# Patient Record
Sex: Female | Born: 1955 | Race: Black or African American | Hispanic: No | Marital: Married | Smoking: Current every day smoker
Health system: Southern US, Community
[De-identification: ages and names within clinical notes are randomized; demographics above are authoritative.]

## PROBLEM LIST (undated history)

## (undated) DIAGNOSIS — Z86010 Personal history of colonic polyps: Secondary | ICD-10-CM

## (undated) DIAGNOSIS — E785 Hyperlipidemia, unspecified: Secondary | ICD-10-CM

## (undated) DIAGNOSIS — Z720 Tobacco use: Secondary | ICD-10-CM

## (undated) DIAGNOSIS — M1990 Unspecified osteoarthritis, unspecified site: Secondary | ICD-10-CM

## (undated) DIAGNOSIS — I739 Peripheral vascular disease, unspecified: Secondary | ICD-10-CM

## (undated) DIAGNOSIS — E119 Type 2 diabetes mellitus without complications: Secondary | ICD-10-CM

## (undated) DIAGNOSIS — I10 Essential (primary) hypertension: Secondary | ICD-10-CM

## (undated) DIAGNOSIS — T7840XA Allergy, unspecified, initial encounter: Secondary | ICD-10-CM

## (undated) DIAGNOSIS — K219 Gastro-esophageal reflux disease without esophagitis: Secondary | ICD-10-CM

## (undated) DIAGNOSIS — F419 Anxiety disorder, unspecified: Secondary | ICD-10-CM

## (undated) DIAGNOSIS — I70219 Atherosclerosis of native arteries of extremities with intermittent claudication, unspecified extremity: Secondary | ICD-10-CM

## (undated) DIAGNOSIS — Z794 Long term (current) use of insulin: Secondary | ICD-10-CM

## (undated) HISTORY — DX: Long term (current) use of insulin: Z79.4

## (undated) HISTORY — DX: Personal history of colonic polyps: Z86.010

## (undated) HISTORY — DX: Atherosclerosis of native arteries of extremities with intermittent claudication, unspecified extremity: I70.219

## (undated) HISTORY — DX: Unspecified osteoarthritis, unspecified site: M19.90

## (undated) HISTORY — DX: Peripheral vascular disease, unspecified: I73.9

## (undated) HISTORY — DX: Allergy, unspecified, initial encounter: T78.40XA

## (undated) HISTORY — DX: Gastro-esophageal reflux disease without esophagitis: K21.9

## (undated) HISTORY — DX: Hyperlipidemia, unspecified: E78.5

## (undated) HISTORY — DX: Anxiety disorder, unspecified: F41.9

## (undated) HISTORY — PX: VAGINAL HYSTERECTOMY: SUR661

## (undated) HISTORY — DX: Tobacco use: Z72.0

## (undated) HISTORY — PX: COLONOSCOPY: SHX174

## (undated) HISTORY — DX: Essential (primary) hypertension: I10

## (undated) HISTORY — DX: Type 2 diabetes mellitus without complications: E11.9

---

## 1993-07-20 DIAGNOSIS — E1165 Type 2 diabetes mellitus with hyperglycemia: Secondary | ICD-10-CM

## 1993-07-20 DIAGNOSIS — Z794 Long term (current) use of insulin: Secondary | ICD-10-CM

## 1993-07-20 DIAGNOSIS — E119 Type 2 diabetes mellitus without complications: Secondary | ICD-10-CM | POA: Insufficient documentation

## 1998-06-06 ENCOUNTER — Encounter: Admission: RE | Admit: 1998-06-06 | Discharge: 1998-06-06 | Payer: Self-pay | Admitting: Hematology and Oncology

## 1998-08-28 ENCOUNTER — Encounter: Admission: RE | Admit: 1998-08-28 | Discharge: 1998-08-28 | Payer: Self-pay | Admitting: Hematology and Oncology

## 1998-11-26 ENCOUNTER — Encounter: Admission: RE | Admit: 1998-11-26 | Discharge: 1998-11-26 | Payer: Self-pay | Admitting: Hematology and Oncology

## 1999-02-26 ENCOUNTER — Encounter: Admission: RE | Admit: 1999-02-26 | Discharge: 1999-02-26 | Payer: Self-pay | Admitting: Internal Medicine

## 1999-07-11 ENCOUNTER — Encounter: Admission: RE | Admit: 1999-07-11 | Discharge: 1999-07-11 | Payer: Self-pay | Admitting: Internal Medicine

## 1999-07-31 ENCOUNTER — Encounter: Admission: RE | Admit: 1999-07-31 | Discharge: 1999-07-31 | Payer: Self-pay | Admitting: Hematology and Oncology

## 1999-10-16 ENCOUNTER — Encounter: Admission: RE | Admit: 1999-10-16 | Discharge: 1999-10-16 | Payer: Self-pay | Admitting: Internal Medicine

## 2000-03-13 ENCOUNTER — Emergency Department (HOSPITAL_COMMUNITY): Admission: EM | Admit: 2000-03-13 | Discharge: 2000-03-13 | Payer: Self-pay | Admitting: Emergency Medicine

## 2000-04-15 ENCOUNTER — Encounter: Admission: RE | Admit: 2000-04-15 | Discharge: 2000-04-15 | Payer: Self-pay | Admitting: Hematology and Oncology

## 2000-07-25 ENCOUNTER — Encounter: Payer: Self-pay | Admitting: Emergency Medicine

## 2000-07-25 ENCOUNTER — Emergency Department (HOSPITAL_COMMUNITY): Admission: EM | Admit: 2000-07-25 | Discharge: 2000-07-25 | Payer: Self-pay | Admitting: Emergency Medicine

## 2000-08-31 ENCOUNTER — Encounter: Admission: RE | Admit: 2000-08-31 | Discharge: 2000-08-31 | Payer: Self-pay | Admitting: Hematology and Oncology

## 2000-09-14 ENCOUNTER — Encounter: Admission: RE | Admit: 2000-09-14 | Discharge: 2000-09-14 | Payer: Self-pay | Admitting: Hematology and Oncology

## 2001-02-10 ENCOUNTER — Encounter: Admission: RE | Admit: 2001-02-10 | Discharge: 2001-02-10 | Payer: Self-pay | Admitting: Hematology and Oncology

## 2001-03-04 ENCOUNTER — Encounter: Admission: RE | Admit: 2001-03-04 | Discharge: 2001-03-04 | Payer: Self-pay | Admitting: Internal Medicine

## 2001-03-09 ENCOUNTER — Encounter: Payer: Self-pay | Admitting: *Deleted

## 2001-03-09 ENCOUNTER — Ambulatory Visit (HOSPITAL_COMMUNITY): Admission: RE | Admit: 2001-03-09 | Discharge: 2001-03-09 | Payer: Self-pay | Admitting: *Deleted

## 2001-07-29 ENCOUNTER — Encounter: Admission: RE | Admit: 2001-07-29 | Discharge: 2001-07-29 | Payer: Self-pay | Admitting: Internal Medicine

## 2001-08-10 ENCOUNTER — Ambulatory Visit (HOSPITAL_COMMUNITY): Admission: RE | Admit: 2001-08-10 | Discharge: 2001-08-10 | Payer: Self-pay | Admitting: Internal Medicine

## 2001-08-23 ENCOUNTER — Encounter: Admission: RE | Admit: 2001-08-23 | Discharge: 2001-08-23 | Payer: Self-pay | Admitting: Internal Medicine

## 2001-08-25 ENCOUNTER — Encounter: Admission: RE | Admit: 2001-08-25 | Discharge: 2001-08-25 | Payer: Self-pay | Admitting: Internal Medicine

## 2001-09-08 ENCOUNTER — Encounter: Admission: RE | Admit: 2001-09-08 | Discharge: 2001-09-08 | Payer: Self-pay | Admitting: Internal Medicine

## 2002-12-24 ENCOUNTER — Encounter: Payer: Self-pay | Admitting: Emergency Medicine

## 2002-12-24 ENCOUNTER — Emergency Department (HOSPITAL_COMMUNITY): Admission: EM | Admit: 2002-12-24 | Discharge: 2002-12-24 | Payer: Self-pay | Admitting: Emergency Medicine

## 2004-08-27 ENCOUNTER — Ambulatory Visit: Payer: Self-pay | Admitting: Internal Medicine

## 2004-08-29 ENCOUNTER — Ambulatory Visit: Payer: Self-pay | Admitting: *Deleted

## 2005-02-16 ENCOUNTER — Ambulatory Visit: Payer: Self-pay | Admitting: Internal Medicine

## 2005-02-24 ENCOUNTER — Ambulatory Visit (HOSPITAL_COMMUNITY): Admission: RE | Admit: 2005-02-24 | Discharge: 2005-02-24 | Payer: Self-pay | Admitting: Family Medicine

## 2005-03-17 ENCOUNTER — Ambulatory Visit: Payer: Self-pay | Admitting: Internal Medicine

## 2005-03-25 ENCOUNTER — Ambulatory Visit: Payer: Self-pay | Admitting: Internal Medicine

## 2005-06-26 ENCOUNTER — Ambulatory Visit: Payer: Self-pay | Admitting: Internal Medicine

## 2005-09-21 ENCOUNTER — Ambulatory Visit: Payer: Self-pay | Admitting: Family Medicine

## 2005-10-16 ENCOUNTER — Ambulatory Visit: Payer: Self-pay | Admitting: Internal Medicine

## 2005-11-12 ENCOUNTER — Ambulatory Visit: Payer: Self-pay | Admitting: Internal Medicine

## 2006-01-08 ENCOUNTER — Ambulatory Visit: Payer: Self-pay | Admitting: Internal Medicine

## 2006-01-11 ENCOUNTER — Ambulatory Visit (HOSPITAL_COMMUNITY): Admission: RE | Admit: 2006-01-11 | Discharge: 2006-01-11 | Payer: Self-pay | Admitting: Internal Medicine

## 2006-10-22 ENCOUNTER — Ambulatory Visit: Payer: Self-pay | Admitting: Internal Medicine

## 2006-11-18 ENCOUNTER — Emergency Department (HOSPITAL_COMMUNITY): Admission: EM | Admit: 2006-11-18 | Discharge: 2006-11-18 | Payer: Self-pay | Admitting: Emergency Medicine

## 2007-01-20 ENCOUNTER — Ambulatory Visit: Payer: Self-pay | Admitting: Internal Medicine

## 2007-01-21 ENCOUNTER — Encounter (INDEPENDENT_AMBULATORY_CARE_PROVIDER_SITE_OTHER): Payer: Self-pay | Admitting: Internal Medicine

## 2007-01-21 LAB — CONVERTED CEMR LAB: Microalbumin U total vol: 1.41 mg/L

## 2007-02-15 ENCOUNTER — Ambulatory Visit: Payer: Self-pay | Admitting: Internal Medicine

## 2007-02-15 LAB — CONVERTED CEMR LAB: Hgb A1c MFr Bld: 9 %

## 2007-02-23 ENCOUNTER — Ambulatory Visit (HOSPITAL_COMMUNITY): Admission: RE | Admit: 2007-02-23 | Discharge: 2007-02-23 | Payer: Self-pay | Admitting: Internal Medicine

## 2007-02-23 ENCOUNTER — Ambulatory Visit: Payer: Self-pay | Admitting: Vascular Surgery

## 2007-03-01 ENCOUNTER — Ambulatory Visit: Payer: Self-pay | Admitting: Internal Medicine

## 2007-03-25 ENCOUNTER — Ambulatory Visit: Payer: Self-pay | Admitting: Vascular Surgery

## 2007-07-14 ENCOUNTER — Ambulatory Visit: Payer: Self-pay | Admitting: Internal Medicine

## 2007-07-21 ENCOUNTER — Encounter (INDEPENDENT_AMBULATORY_CARE_PROVIDER_SITE_OTHER): Payer: Self-pay | Admitting: Internal Medicine

## 2007-07-21 DIAGNOSIS — IMO0001 Reserved for inherently not codable concepts without codable children: Secondary | ICD-10-CM | POA: Insufficient documentation

## 2007-07-21 DIAGNOSIS — E119 Type 2 diabetes mellitus without complications: Secondary | ICD-10-CM

## 2007-07-21 DIAGNOSIS — Z794 Long term (current) use of insulin: Secondary | ICD-10-CM

## 2007-07-21 DIAGNOSIS — F17200 Nicotine dependence, unspecified, uncomplicated: Secondary | ICD-10-CM | POA: Insufficient documentation

## 2007-07-21 DIAGNOSIS — I10 Essential (primary) hypertension: Secondary | ICD-10-CM

## 2007-07-21 HISTORY — DX: Reserved for inherently not codable concepts without codable children: IMO0001

## 2007-07-26 ENCOUNTER — Emergency Department (HOSPITAL_COMMUNITY): Admission: EM | Admit: 2007-07-26 | Discharge: 2007-07-26 | Payer: Self-pay | Admitting: Emergency Medicine

## 2007-08-10 ENCOUNTER — Ambulatory Visit: Payer: Self-pay | Admitting: Vascular Surgery

## 2007-08-31 ENCOUNTER — Encounter (INDEPENDENT_AMBULATORY_CARE_PROVIDER_SITE_OTHER): Payer: Self-pay | Admitting: *Deleted

## 2007-12-01 ENCOUNTER — Ambulatory Visit: Payer: Self-pay | Admitting: Internal Medicine

## 2008-04-25 ENCOUNTER — Ambulatory Visit: Payer: Self-pay | Admitting: Internal Medicine

## 2008-07-26 ENCOUNTER — Ambulatory Visit: Payer: Self-pay | Admitting: Internal Medicine

## 2008-07-26 LAB — CONVERTED CEMR LAB
ALT: <8 units/L (ref 0–35)
BUN: 14 mg/dL (ref 6–23)
CO2: 24 meq/L (ref 19–32)
Calcium: 9.9 mg/dL (ref 8.4–10.5)
Chloride: 106 meq/L (ref 96–112)
Cholesterol: 213 mg/dL — ABNORMAL HIGH (ref 0–200)
Creatinine, Ser: 0.64 mg/dL (ref 0.40–1.20)
HDL: 41 mg/dL (ref >39)
Total Bilirubin: 0.4 mg/dL (ref 0.3–1.2)
Total CHOL/HDL Ratio: 5.2
Triglycerides: 209 mg/dL — ABNORMAL HIGH (ref <150)
VLDL: 42 mg/dL — ABNORMAL HIGH (ref 0–40)

## 2008-08-03 ENCOUNTER — Ambulatory Visit: Payer: Self-pay | Admitting: Internal Medicine

## 2008-12-25 ENCOUNTER — Ambulatory Visit: Payer: Self-pay | Admitting: Internal Medicine

## 2008-12-25 LAB — CONVERTED CEMR LAB
BUN: 17 mg/dL (ref 6–23)
CO2: 28 meq/L (ref 19–32)
Calcium: 9.8 mg/dL (ref 8.4–10.5)
Cholesterol: 161 mg/dL (ref 0–200)
Creatinine, Ser: 0.84 mg/dL (ref 0.40–1.20)
Glucose, Bld: 339 mg/dL — ABNORMAL HIGH (ref 70–99)
TSH: 0.569 microintl units/mL (ref 0.350–4.50)
Total CHOL/HDL Ratio: 4.5

## 2009-06-20 ENCOUNTER — Ambulatory Visit: Payer: Self-pay | Admitting: Internal Medicine

## 2009-10-27 ENCOUNTER — Emergency Department (HOSPITAL_COMMUNITY): Admission: EM | Admit: 2009-10-27 | Discharge: 2009-10-27 | Payer: Self-pay | Admitting: Emergency Medicine

## 2009-12-16 ENCOUNTER — Emergency Department (HOSPITAL_BASED_OUTPATIENT_CLINIC_OR_DEPARTMENT_OTHER): Admission: EM | Admit: 2009-12-16 | Discharge: 2009-12-16 | Payer: Self-pay | Admitting: Emergency Medicine

## 2009-12-16 ENCOUNTER — Ambulatory Visit: Payer: Self-pay | Admitting: Diagnostic Radiology

## 2010-02-04 ENCOUNTER — Ambulatory Visit: Payer: Self-pay | Admitting: Internal Medicine

## 2010-02-04 LAB — CONVERTED CEMR LAB
CO2: 26 meq/L (ref 19–32)
Calcium: 9.5 mg/dL (ref 8.4–10.5)
Glucose, Bld: 118 mg/dL — ABNORMAL HIGH (ref 70–99)
Potassium: 3.9 meq/L (ref 3.5–5.3)
Sodium: 142 meq/L (ref 135–145)

## 2010-03-21 ENCOUNTER — Emergency Department (HOSPITAL_COMMUNITY): Admission: EM | Admit: 2010-03-21 | Discharge: 2010-03-21 | Payer: Self-pay | Admitting: Emergency Medicine

## 2010-04-08 ENCOUNTER — Ambulatory Visit: Payer: Self-pay | Admitting: Internal Medicine

## 2010-06-04 ENCOUNTER — Ambulatory Visit: Payer: Self-pay | Admitting: Vascular Surgery

## 2010-06-04 ENCOUNTER — Emergency Department (HOSPITAL_COMMUNITY): Admission: EM | Admit: 2010-06-04 | Discharge: 2010-06-04 | Payer: Self-pay | Admitting: Emergency Medicine

## 2010-06-13 ENCOUNTER — Ambulatory Visit: Payer: Self-pay | Admitting: Vascular Surgery

## 2010-06-13 ENCOUNTER — Ambulatory Visit (HOSPITAL_COMMUNITY): Admission: RE | Admit: 2010-06-13 | Discharge: 2010-06-13 | Payer: Self-pay | Admitting: Vascular Surgery

## 2010-06-13 HISTORY — PX: VEIN BYPASS SURGERY: SHX833

## 2010-07-01 ENCOUNTER — Inpatient Hospital Stay (HOSPITAL_COMMUNITY): Admission: RE | Admit: 2010-07-01 | Discharge: 2010-07-03 | Payer: Self-pay | Admitting: Vascular Surgery

## 2010-07-02 ENCOUNTER — Encounter: Payer: Self-pay | Admitting: Vascular Surgery

## 2010-07-02 ENCOUNTER — Ambulatory Visit: Payer: Self-pay | Admitting: Vascular Surgery

## 2010-07-16 ENCOUNTER — Ambulatory Visit: Payer: Self-pay | Admitting: Vascular Surgery

## 2010-07-16 ENCOUNTER — Encounter: Admission: RE | Admit: 2010-07-16 | Discharge: 2010-07-16 | Payer: Self-pay | Admitting: Vascular Surgery

## 2010-10-20 ENCOUNTER — Ambulatory Visit (HOSPITAL_COMMUNITY): Admission: RE | Admit: 2010-10-20 | Discharge: 2010-10-20 | Payer: Self-pay | Admitting: Family Medicine

## 2010-10-23 ENCOUNTER — Ambulatory Visit: Payer: Self-pay | Admitting: Vascular Surgery

## 2010-12-31 ENCOUNTER — Ambulatory Visit: Payer: Self-pay | Admitting: Vascular Surgery

## 2011-01-30 ENCOUNTER — Encounter (INDEPENDENT_AMBULATORY_CARE_PROVIDER_SITE_OTHER): Payer: Self-pay | Admitting: *Deleted

## 2011-01-30 LAB — CONVERTED CEMR LAB
ALT: 8 units/L (ref 0–35)
AST: 9 units/L (ref 0–37)
Albumin: 4.2 g/dL (ref 3.5–5.2)
BUN: 20 mg/dL (ref 6–23)
CO2: 29 meq/L (ref 19–32)
Calcium: 9.7 mg/dL (ref 8.4–10.5)
Chloride: 97 meq/L (ref 96–112)
Creatinine, Ser: 0.72 mg/dL (ref 0.40–1.20)
Microalb, Ur: 0.58 mg/dL (ref 0.00–1.89)
Potassium: 3.8 meq/L (ref 3.5–5.3)

## 2011-02-28 LAB — GLUCOSE, CAPILLARY
Glucose-Capillary: 107 mg/dL — ABNORMAL HIGH (ref 70–99)
Glucose-Capillary: 122 mg/dL — ABNORMAL HIGH (ref 70–99)
Glucose-Capillary: 159 mg/dL — ABNORMAL HIGH (ref 70–99)
Glucose-Capillary: 168 mg/dL — ABNORMAL HIGH (ref 70–99)
Glucose-Capillary: 266 mg/dL — ABNORMAL HIGH (ref 70–99)
Glucose-Capillary: 317 mg/dL — ABNORMAL HIGH (ref 70–99)

## 2011-02-28 LAB — HEMOGLOBIN A1C
Hgb A1c MFr Bld: 11.3 % — ABNORMAL HIGH (ref <5.7)
Mean Plasma Glucose: 278 mg/dL — ABNORMAL HIGH (ref <117)

## 2011-02-28 LAB — CBC
Platelets: 210 10*3/uL (ref 150–400)
RBC: 4.03 MIL/uL (ref 3.87–5.11)
RDW: 14.2 % (ref 11.5–15.5)
WBC: 6.7 10*3/uL (ref 4.0–10.5)

## 2011-02-28 LAB — BASIC METABOLIC PANEL
BUN: 11 mg/dL (ref 6–23)
Creatinine, Ser: 0.76 mg/dL (ref 0.4–1.2)
GFR calc Af Amer: >60 mL/min (ref >60)
GFR calc non Af Amer: >60 mL/min (ref >60)

## 2011-03-01 LAB — COMPREHENSIVE METABOLIC PANEL
ALT: 17 U/L (ref 0–35)
Alkaline Phosphatase: 90 U/L (ref 39–117)
CO2: 31 mEq/L (ref 19–32)
Chloride: 98 mEq/L (ref 96–112)
GFR calc non Af Amer: >60 mL/min (ref >60)
Glucose, Bld: 347 mg/dL — ABNORMAL HIGH (ref 70–99)
Potassium: 4 mEq/L (ref 3.5–5.1)
Sodium: 137 mEq/L (ref 135–145)

## 2011-03-01 LAB — CBC
HCT: 37.4 % (ref 36.0–46.0)
Hemoglobin: 12.4 g/dL (ref 12.0–15.0)
Hemoglobin: 13.2 g/dL (ref 12.0–15.0)
MCHC: 32.1 g/dL (ref 30.0–36.0)
Platelets: 288 10*3/uL (ref 150–400)
RDW: 14.3 % (ref 11.5–15.5)
WBC: 4.7 10*3/uL (ref 4.0–10.5)

## 2011-03-01 LAB — DIFFERENTIAL
Basophils Absolute: 0 10*3/uL (ref 0.0–0.1)
Basophils Relative: 1 % (ref 0–1)
Neutro Abs: 2.9 10*3/uL (ref 1.7–7.7)
Neutrophils Relative %: 51 % (ref 43–77)

## 2011-03-01 LAB — URINALYSIS, ROUTINE W REFLEX MICROSCOPIC
Bilirubin Urine: NEGATIVE
Hgb urine dipstick: NEGATIVE
Hgb urine dipstick: NEGATIVE
Nitrite: NEGATIVE
Nitrite: NEGATIVE
Specific Gravity, Urine: 1.042 — ABNORMAL HIGH (ref 1.005–1.030)
Specific Gravity, Urine: 1.028 (ref 1.005–1.030)
Urobilinogen, UA: 1 mg/dL (ref 0.0–1.0)
pH: 6.5 (ref 5.0–8.0)

## 2011-03-01 LAB — POCT I-STAT, CHEM 8
Chloride: 97 mEq/L (ref 96–112)
Chloride: 100 mEq/L (ref 96–112)
Creatinine, Ser: 0.9 mg/dL (ref 0.4–1.2)
Glucose, Bld: 360 mg/dL — ABNORMAL HIGH (ref 70–99)
HCT: 45 % (ref 36.0–46.0)
Hemoglobin: 14.3 g/dL (ref 12.0–15.0)
Hemoglobin: 15.3 g/dL — ABNORMAL HIGH (ref 12.0–15.0)
Potassium: 3.7 mEq/L (ref 3.5–5.1)
Potassium: 3.6 mEq/L (ref 3.5–5.1)
Sodium: 139 mEq/L (ref 135–145)

## 2011-03-01 LAB — PROTIME-INR: Prothrombin Time: 12.4 seconds (ref 11.6–15.2)

## 2011-03-01 LAB — GLUCOSE, CAPILLARY
Glucose-Capillary: 305 mg/dL — ABNORMAL HIGH (ref 70–99)
Glucose-Capillary: 235 mg/dL — ABNORMAL HIGH (ref 70–99)
Glucose-Capillary: 278 mg/dL — ABNORMAL HIGH (ref 70–99)

## 2011-03-01 LAB — URINE MICROSCOPIC-ADD ON

## 2011-03-01 LAB — TYPE AND SCREEN: Antibody Screen: NEGATIVE

## 2011-03-01 LAB — SURGICAL PCR SCREEN: MRSA, PCR: NEGATIVE

## 2011-03-01 LAB — CK: Total CK: 72 U/L (ref 7–177)

## 2011-03-18 ENCOUNTER — Emergency Department (HOSPITAL_COMMUNITY)
Admission: EM | Admit: 2011-03-18 | Discharge: 2011-03-18 | Disposition: A | Discharge status: Home / Self Care | Payer: Self-pay | Attending: Emergency Medicine | Admitting: Emergency Medicine

## 2011-03-18 DIAGNOSIS — Z794 Long term (current) use of insulin: Secondary | ICD-10-CM | POA: Insufficient documentation

## 2011-03-18 DIAGNOSIS — I10 Essential (primary) hypertension: Secondary | ICD-10-CM | POA: Insufficient documentation

## 2011-03-18 DIAGNOSIS — E119 Type 2 diabetes mellitus without complications: Secondary | ICD-10-CM | POA: Insufficient documentation

## 2011-03-18 DIAGNOSIS — T63461A Toxic effect of venom of wasps, accidental (unintentional), initial encounter: Secondary | ICD-10-CM | POA: Insufficient documentation

## 2011-03-18 DIAGNOSIS — M7989 Other specified soft tissue disorders: Secondary | ICD-10-CM | POA: Insufficient documentation

## 2011-03-18 DIAGNOSIS — T6391XA Toxic effect of contact with unspecified venomous animal, accidental (unintentional), initial encounter: Secondary | ICD-10-CM | POA: Insufficient documentation

## 2011-03-18 LAB — GLUCOSE, CAPILLARY

## 2011-04-28 NOTE — Assessment & Plan Note (Signed)
OFFICE VISIT   TAYGAN, CONNELL  DOB:  [birth year]                                       08/10/2007  EAVWU#:[id number]   The patient returns for followup today for claudication and evaluation  of a right carotid bruit. Unfortunately, she continues to smoke. She  states that is smoking three cigarettes per day. Her claudication  symptoms are similar to her previous visit in April. She states that her  symptoms may have improved somewhat though with continued walking and  with the Pletal. She continues to have variable symptoms at short  distances and sometimes long distances. However, she does not really  complain of problems with her legs. She has had no neurologic symptoms  such as TIA, amaurosis or stroke. She has had no problems with healing  wounds on her feet.   PHYSICAL EXAMINATION:  Blood pressure 134/81, heart rate 77 and regular.  She had an audible right carotid bruit. She has no left carotid bruit.  She has 2+ carotid pulses. She has 2+ brachial and radial pulses. She  has 2+ femoral pulses with absent popliteal and pedal pulses  bilaterally. ABIs today were 0.56 on the right and 0.63 on the left,  which are essentially unchanged from March of 2008. She also had a  carotid duplex examination today. This showed a right external carotid  artery stenosis, but no significant internal carotid artery stenosis.   As far as her carotid is concerned, she has no significant internal  carotid artery stenosis. I do not believe this needs further evaluation  and the bruit can probably be explained by external carotid artery  stenosis. She probably needs a repeat duplex evaluation in the next 5-6  years due to her underlying risk factor of smoking. As far as her legs  are concerned, she has fairly minimal symptoms at this point. She still  has some Pletal refill prescriptions at this point and she seems to be  satisfied with her walking distance currently. I  reassured her that she  currently is not at risk  for limb loss, but if she continues to smoke, she may be at some point  in the future. She will followup with me in six months time for repeat  ABIs.   Janetta Hora. Fields, MD  Electronically Signed   CEF/MEDQ  D:  08/10/2007  T:  08/11/2007  Job:  [id number]   cc:   Melvern Banker

## 2011-04-28 NOTE — Assessment & Plan Note (Signed)
OFFICE VISIT   Angie, [name]  DOB:  [birth year]                                       10/23/2010  ZOXWR#:[id number]   The patient returns for followup today.  She underwent a femoral-femoral  bypass in July 2011 for claudication in her right leg.  She returns  today for further follow-up.  She states that she still has some  heaviness in her legs on both sides after dancing for long periods of  time but this is not really that bothersome to her.  She also complains  of some intermittent right heel and foot pain and has an appointment  scheduled with a foot doctor in the near future.  This occurs primarily  when she is bearing weight on the right foot.  She does not describe  rest pain or classic claudication.   CHRONIC MEDICAL PROBLEMS:  Include diabetes.  She also has a history of  smoking but quit in July of this past year.   REVIEW OF SYSTEMS:  On review of systems today, she denies any chest  pain or shortness of breath.   PHYSICAL EXAM:  Vital signs:  Blood pressure 110/75 in the left arm,  heart rate 86 and regular, respirations 16. Lower extremities:  She has  an easily palpable femoral-femoral graft pulse.  Her groin incisions are  well-healed.  She has no palpable popliteal or pedal pulses.   She had bilateral ABIs performed today which were 0.69 on the left and  0.57 on the right.  This is a significant improvement on the right side  postoperatively.  She preoperatively was 0.3 on the right.   Overall, the patient is improved from her femoral-femoral bypass.  She  still has some element of peripheral arterial disease with some mild  claudication-type symptoms in her lower extremities bilaterally.  However, at this point she is not interested in any further procedures  to try to improve her circulation further.  Her symptoms are mild in  nature overall.  She will follow up with [place] in 6 months' time to  reevaluate her symptoms and have a  noninvasive exam to make sure that  she is not declining in her ABIs over time.     Angie Hora. Fields, MD  Electronically Signed   CEF/MEDQ  D:  10/23/2010  T:  10/24/2010  Job:  [id number]   cc:   Dineen Kid. Reche Dixon, M.D.  [facility]

## 2011-04-28 NOTE — Assessment & Plan Note (Signed)
OFFICE VISIT   Angie Cain, Angie Cain  DOB:  08/03/1956                                       07/16/2010  ZOXWR#:[id number]   The patient returns for followup today.  She underwent an uneventful  left to right femoral-femoral bypass on 07/01/2010.  She returns today  for further followup.  She states that the claudication symptoms in her  right leg have essentially resolved at this point.  She intermittently  has had some back pain across her lower back and this seems mainly  musculoskeletal in nature and may be due to the fact that she has been  limping some due to her groin incisions.  She also has had some  intermittent pain in her left foot and again I think this may be  somewhat due to the fact that she is walking more than she has been in  some time and probably has some muscle strain overall.  I counseled her  to take some ibuprofen to help alleviate some of these symptoms.   On physical exam blood pressure is 100/69 in the left arm, heart rate is  92 and regular, respirations 16.  The right groin wound has a  superficial epidermis loss approximately 2 x 1 cm at the superior aspect  of the right groin wound.  This does not penetrate deep and there is no  significant drainage from this.  The left groin incision is healed.  She  has an easily palpable graft pulse in her fem-fem bypass.  She has 2+  femoral pulses bilaterally.  She has absent popliteal and pedal pulses.  She had bilateral ABIs today.  The right side ABI has improved from 0.34  to 0.58 postoperatively and the left ABI was 0.7 today.   Overall the patient appears to be doing well.  Her groin incision should  continue to heal.  She will return sooner if she has any problems or  deterioration of the right groin wound.  Otherwise we will see her back  in three months' time for a graft scan.  We will then see her in  intermittent followup after that.  We will also see her for followup in  3  months to see if the symptoms in her lower extremities have improved  somewhat overall.     Janetta Hora. Fields, MD  Electronically Signed   CEF/MEDQ  D:  07/16/2010  T:  07/17/2010  Job:  [id number]   cc:   Dineen Kid. Reche Dixon, M.D.  [facility]

## 2011-04-28 NOTE — Procedures (Signed)
CAROTID DUPLEX EXAM   INDICATION:  Carotid bruit.   HISTORY:  Diabetes:  Yes  Cardiac:  No  Hypertension:  Yes  Smoking:  3 cigarettes a day  Previous Surgery:  No  CV History:  No  Amaurosis Fugax No, Paresthesias No, Hemiparesis No                                       RIGHT             LEFT  Brachial systolic pressure:         138               140  Brachial Doppler waveforms:         Triphasic         Triphasic  Vertebral direction of flow:        Antegrade         Antegrade  DUPLEX VELOCITIES (cm/sec)  CCA peak systolic                   105               96  ECA peak systolic                   292               94  ICA peak systolic                   69                55  ICA end diastolic                   29                22  PLAQUE MORPHOLOGY:                  Mixed             None  PLAQUE AMOUNT:                      Large             None  PLAQUE LOCATION:                    ECA               None   IMPRESSION:  1. Right ECA stenosis.  2. No ICA stenosis bilaterally.   ___________________________________________  Janetta Hora Darrick Penna, MD   DP/MEDQ  D:  08/10/2007  T:  08/11/2007  Job:  [id number]

## 2011-04-28 NOTE — Procedures (Signed)
LOWER EXTREMITY ARTERIAL EVALUATION-SINGLE LEVEL   INDICATION:  Followup peripheral vascular disease.   HISTORY:  Diabetes:  Yes  Cardiac:  No  Hypertension:  Yes  Smoking:  Yes, 3 cigarettes per day  Previous Surgery:  No   RESTING SYSTOLIC PRESSURES: (ABI)                          RIGHT                LEFT  Brachial:               138                  140  Anterior tibial:        74                   80  Posterior tibial:       78 (0.56)            88 (0.63)  Peroneal:  DOPPLER WAVEFORM ANALYSIS:  Anterior tibial:        Monophasic           Monophasic  Posterior tibial:       Monophasic           Monophasic  Peroneal:   PREVIOUS ABI'S:  Date: 02/23/2007  RIGHT:  0.52  LEFT:  0.67   IMPRESSION:  Stable ankle brachial indices bilaterally.   ___________________________________________  Janetta Hora Darrick Penna, MD   DP/MEDQ  D:  08/10/2007  T:  08/11/2007  Job:  [id number]

## 2011-04-28 NOTE — Assessment & Plan Note (Signed)
OFFICE VISIT   Angie, Cain  DOB:  03/05/1956                                       06/04/2010  EAVWU#:[id number]   The patient is a 55 year old female referred by [facility] for right  lower extremity pain.  I saw her in August 2008 for claudication  symptoms and a right carotid bruit.  She states that her symptoms have  progressively worsened over time and she has basically had continuous  pain in her right foot for the last 2 months.  She sometimes has to  dangle the foot out of bed to relieve the pain.  I congratulated her  today that she has now been 60 days tobacco-free.  She also has a  history of neuropathy for the past several years.  She has not had any  problems with nonhealing ulcerations on the foot.   Chronic medical problems include:  Diabetes, hypertension, elevated  cholesterol.  There are all currently followed by [facility].   PAST SURGICAL HISTORY:  Hysterectomy.   MEDICATIONS:  Glucotrol XL 10 mg once a day,  lisinopril/hydrochlorothiazide 20/25 once a day, Lantus insulin in the  morning, aspirin 81 mg once a day, Neurontin 300 mg twice a day, Vicodin  5/500 three times a day, amitriptyline 50 mg 1 q.h.s., Xyzal 5 mg once  daily, Crestor 10 mg q.h.s.   ALLERGIES:  She has an allergy listed to Flagyl.   SOCIAL HISTORY:  She is unemployed.  She is married.  She has 2  children.  Smoking history is as listed above.  She does not consume  alcohol regularly and denies drug use.   FAMILY HISTORY:  Unremarkable.   REVIEW OF SYSTEMS:  Twelve-point review of systems was performed with  the patient.  Please see intake referral form for details regarding  this.   PHYSICAL EXAM:  Blood pressure 110/73 in the left arm.  Heart rate is  100 and regular.  Temperature is 98.  HEENT is unremarkable.  Neck has  2+ carotid pulses without bruit.  Chest:  Clear to auscultation.  Cardiac exam is regular rate and rhythm without murmur.   Abdomen is  soft, nontender, nondistended, no masses.  Extremities:  She has 2+  radial pulses bilaterally.  In her lower extremities, she has a 2+ left  femoral pulse.  She has absent right femoral pulse.  She has absent  popliteal and pedal pulses bilaterally.  Musculoskeletal exam:  She has  no significant major joint deformities.  Skin:  She has no open ulcers  or rashes.  Neurologic exam shows symmetric upper extremity and lower  extremity motor strength which is 5/5.  She has decreased sensation to  light touch in the feet bilaterally.   She had bilateral ABIs today which were reviewed and interpreted by me.  ABI on the right side was 0.34, on the left was 0.78.   In summary, the patient has severely decreased ABI on the right side and  has symptoms of rest pain.  She now seems to have progressed from  claudication to more severe arterial occlusive disease on the right  side.  I believe the best option would be arteriogram and lower  extremity runoff.  She most likely has a component of multilevel  arterial disease in the right leg.  I have discussed with her, today,  the risks  and possible complications, and procedure details involved in  arteriogram as well as possible angioplasty and stenting.  She  understands and agrees to proceed.  This is scheduled for June 13, 2010.     Janetta Hora. Fields, MD  Electronically Signed   CEF/MEDQ  D:  06/04/2010  T:  06/05/2010  Job:  [id number]   cc:   Dineen Kid. Reche Dixon, M.D.  [facility]

## 2011-06-13 ENCOUNTER — Emergency Department (HOSPITAL_COMMUNITY)
Admission: EM | Admit: 2011-06-13 | Discharge: 2011-06-13 | Disposition: A | Discharge status: Home / Self Care | Payer: Self-pay | Attending: Emergency Medicine | Admitting: Emergency Medicine

## 2011-06-13 ENCOUNTER — Emergency Department (HOSPITAL_COMMUNITY): Payer: Self-pay

## 2011-06-13 DIAGNOSIS — F3289 Other specified depressive episodes: Secondary | ICD-10-CM | POA: Insufficient documentation

## 2011-06-13 DIAGNOSIS — R109 Unspecified abdominal pain: Secondary | ICD-10-CM | POA: Insufficient documentation

## 2011-06-13 DIAGNOSIS — I10 Essential (primary) hypertension: Secondary | ICD-10-CM | POA: Insufficient documentation

## 2011-06-13 DIAGNOSIS — Z794 Long term (current) use of insulin: Secondary | ICD-10-CM | POA: Insufficient documentation

## 2011-06-13 DIAGNOSIS — R42 Dizziness and giddiness: Secondary | ICD-10-CM | POA: Insufficient documentation

## 2011-06-13 DIAGNOSIS — E119 Type 2 diabetes mellitus without complications: Secondary | ICD-10-CM | POA: Insufficient documentation

## 2011-06-13 DIAGNOSIS — F329 Major depressive disorder, single episode, unspecified: Secondary | ICD-10-CM | POA: Insufficient documentation

## 2011-06-13 DIAGNOSIS — R1031 Right lower quadrant pain: Secondary | ICD-10-CM | POA: Insufficient documentation

## 2011-06-13 DIAGNOSIS — R112 Nausea with vomiting, unspecified: Secondary | ICD-10-CM | POA: Insufficient documentation

## 2011-06-13 LAB — RAPID URINE DRUG SCREEN, HOSP PERFORMED
Amphetamines: NOT DETECTED
Barbiturates: NOT DETECTED
Tetrahydrocannabinol: POSITIVE — AB

## 2011-06-13 LAB — COMPREHENSIVE METABOLIC PANEL
AST: 14 U/L (ref 0–37)
Albumin: 4 g/dL (ref 3.5–5.2)
Alkaline Phosphatase: 61 U/L (ref 39–117)
BUN: 11 mg/dL (ref 6–23)
Chloride: 100 mEq/L (ref 96–112)
Potassium: 3.3 mEq/L — ABNORMAL LOW (ref 3.5–5.1)
Total Bilirubin: 0.3 mg/dL (ref 0.3–1.2)
Total Protein: 7.6 g/dL (ref 6.0–8.3)

## 2011-06-13 LAB — DIFFERENTIAL
Basophils Relative: 0 % (ref 0–1)
Lymphocytes Relative: 19 % (ref 12–46)
Lymphs Abs: 1.4 10*3/uL (ref 0.7–4.0)
Monocytes Relative: 5 % (ref 3–12)
Neutro Abs: 5.4 10*3/uL (ref 1.7–7.7)
Neutrophils Relative %: 76 % (ref 43–77)

## 2011-06-13 LAB — CBC
HCT: 39.7 % (ref 36.0–46.0)
Hemoglobin: 12.9 g/dL (ref 12.0–15.0)
MCH: 24.5 pg — ABNORMAL LOW (ref 26.0–34.0)
RBC: 5.27 MIL/uL — ABNORMAL HIGH (ref 3.87–5.11)

## 2011-06-13 LAB — URINALYSIS, ROUTINE W REFLEX MICROSCOPIC
Bilirubin Urine: NEGATIVE
Glucose, UA: 100 mg/dL — AB
Hgb urine dipstick: NEGATIVE
Ketones, ur: 15 mg/dL — AB
Protein, ur: NEGATIVE mg/dL
Urobilinogen, UA: 0.2 mg/dL (ref 0.0–1.0)

## 2011-06-13 LAB — GLUCOSE, CAPILLARY: Glucose-Capillary: 177 mg/dL — ABNORMAL HIGH (ref 70–99)

## 2011-06-13 MED ORDER — IOHEXOL 300 MG/ML  SOLN
125.0000 mL | Freq: Once | INTRAMUSCULAR | Status: AC | PRN
  Administered 2011-06-13: 125 mL via INTRAVENOUS

## 2011-08-14 ENCOUNTER — Other Ambulatory Visit: Payer: Self-pay

## 2011-08-14 ENCOUNTER — Other Ambulatory Visit (HOSPITAL_COMMUNITY): Payer: Self-pay | Admitting: Family Medicine

## 2011-08-14 DIAGNOSIS — Z780 Asymptomatic menopausal state: Secondary | ICD-10-CM

## 2011-08-14 DIAGNOSIS — Z1231 Encounter for screening mammogram for malignant neoplasm of breast: Secondary | ICD-10-CM

## 2011-08-27 ENCOUNTER — Ambulatory Visit (HOSPITAL_COMMUNITY)
Admission: RE | Admit: 2011-08-27 | Discharge: 2011-08-27 | Disposition: A | Discharge status: Home / Self Care | Payer: Self-pay | Source: Ambulatory Visit | Attending: Family Medicine | Admitting: Family Medicine

## 2011-08-27 DIAGNOSIS — Z780 Asymptomatic menopausal state: Secondary | ICD-10-CM

## 2011-08-27 DIAGNOSIS — Z1231 Encounter for screening mammogram for malignant neoplasm of breast: Secondary | ICD-10-CM | POA: Insufficient documentation

## 2011-09-28 LAB — COMPREHENSIVE METABOLIC PANEL
AST: 15
Albumin: 3.2 — ABNORMAL LOW
Alkaline Phosphatase: 91
Chloride: 106
GFR calc Af Amer: >60
Potassium: 3.9
Total Bilirubin: 0.7

## 2011-09-28 LAB — URINE MICROSCOPIC-ADD ON

## 2011-09-28 LAB — CBC
Platelets: 318
WBC: 7

## 2011-09-28 LAB — URINALYSIS, ROUTINE W REFLEX MICROSCOPIC
Glucose, UA: >1000 — AB
Ketones, ur: NEGATIVE
Leukocytes, UA: NEGATIVE
pH: 6.5

## 2011-09-28 LAB — DIFFERENTIAL
Basophils Absolute: 0
Basophils Relative: 0
Eosinophils Relative: 1
Monocytes Absolute: 0.8 — ABNORMAL HIGH

## 2011-09-28 LAB — POCT CARDIAC MARKERS: CKMB, poc: <1 — ABNORMAL LOW

## 2012-06-02 ENCOUNTER — Emergency Department (HOSPITAL_COMMUNITY)
Admission: EM | Admit: 2012-06-02 | Discharge: 2012-06-02 | Disposition: A | Discharge status: Home / Self Care | Payer: Self-pay | Attending: Emergency Medicine | Admitting: Emergency Medicine

## 2012-06-02 ENCOUNTER — Encounter (HOSPITAL_COMMUNITY): Payer: Self-pay | Admitting: Emergency Medicine

## 2012-06-02 DIAGNOSIS — Z794 Long term (current) use of insulin: Secondary | ICD-10-CM | POA: Insufficient documentation

## 2012-06-02 DIAGNOSIS — M2550 Pain in unspecified joint: Secondary | ICD-10-CM | POA: Insufficient documentation

## 2012-06-02 DIAGNOSIS — F17200 Nicotine dependence, unspecified, uncomplicated: Secondary | ICD-10-CM | POA: Insufficient documentation

## 2012-06-02 DIAGNOSIS — E119 Type 2 diabetes mellitus without complications: Secondary | ICD-10-CM | POA: Insufficient documentation

## 2012-06-02 LAB — CBC
MCHC: 32.5 g/dL (ref 30.0–36.0)
MCV: 78 fL (ref 78.0–100.0)
Platelets: 321 10*3/uL (ref 150–400)
RDW: 14 % (ref 11.5–15.5)
WBC: 4.3 10*3/uL (ref 4.0–10.5)

## 2012-06-02 LAB — COMPREHENSIVE METABOLIC PANEL
ALT: 10 U/L (ref 0–35)
Albumin: 3.9 g/dL (ref 3.5–5.2)
Alkaline Phosphatase: 74 U/L (ref 39–117)
BUN: 11 mg/dL (ref 6–23)
Chloride: 98 mEq/L (ref 96–112)
Glucose, Bld: 193 mg/dL — ABNORMAL HIGH (ref 70–99)
Potassium: 3.6 mEq/L (ref 3.5–5.1)
Total Bilirubin: 0.3 mg/dL (ref 0.3–1.2)

## 2012-06-02 MED ORDER — KETOROLAC TROMETHAMINE 10 MG PO TABS: 10.0000 mg | ORAL_TABLET | Freq: Four times a day (QID) | ORAL | Status: AC | PRN

## 2012-06-02 MED ORDER — SODIUM CHLORIDE 0.9 % IV SOLN
Freq: Once | INTRAVENOUS | Status: AC
  Administered 2012-06-02: 20 mL/h via INTRAVENOUS

## 2012-06-02 MED ORDER — KETOROLAC TROMETHAMINE 30 MG/ML IJ SOLN
30.0000 mg | Freq: Once | INTRAMUSCULAR | Status: AC
  Administered 2012-06-02: 30 mg via INTRAVENOUS
  Filled 2012-06-02: qty 1

## 2012-06-02 NOTE — ED Provider Notes (Signed)
History     CSN: [id number]  Arrival date & time 06/02/12  9629   First MD Initiated Contact with Patient 06/02/12 1034      Chief Complaint  Patient presents with  . Leg Pain  . Hip Pain  . Back Pain    (Consider location/radiation/quality/duration/timing/severity/associated sxs/prior treatment) Patient is a 56 y.o. female presenting with leg pain, hip pain, and back pain. The history is provided by the patient.  Leg Pain  The incident occurred more than 1 week ago. There was no injury mechanism.  Hip Pain  Back Pain  Associated symptoms include leg pain.   56 y/o IDDM female INAD c/o bilateral posterior leg hip and low back pain occuring constanatly and steadily worsening for 3 months. Pt has been taking neurontin for her diabetic neuropathy with little relief. Pt has been seen by her PCP for same complaint and told that it was a combination of diabetic neuropathy and arthritis. Pt has tried  Multiple OTC rx and has been given shots of dilaudid with little relief. Denies change in bowel or bladder habits or numbness and paraesthesia. Pain is not exacerbated by movent but by position change, specifically going from sitting to standing.  Pt presently today because she sates the pain is more severe over the last day.     Past Medical History  Diagnosis Date  . Diabetes mellitus     Past Surgical History  Procedure Date  . Vein bypass surgery 06/2010    vein from left to right hip    No family history on file.  History  Substance Use Topics  . Smoking status: Current Some Day Smoker -- 0.5 packs/day  . Smokeless tobacco: Not on file  . Alcohol Use: No    OB History    Grav Para Term Preterm Abortions TAB SAB Ect Mult Living                  Review of Systems  Musculoskeletal: Positive for back pain.  All other systems reviewed and are negative.    Allergies  Metronidazole  Home Medications   Current Outpatient Rx  Name Route Sig Dispense Refill  .  VITAMIN D 1000 UNITS PO TABS Oral Take 1,000 Units by mouth daily.    Marland Kitchen GABAPENTIN 300 MG PO CAPS Oral Take 300 mg by mouth 3 (three) times daily.    . INSULIN ASPART 100 UNIT/ML [place] SOLN Subcutaneous Inject 2-11 Units into the skin See admin instructions. Per sliding scale    . INSULIN GLARGINE 100 UNIT/ML [place] SOLN Subcutaneous Inject 37 Units into the skin at bedtime.    Marland Kitchen LISINOPRIL 20 MG PO TABS Oral Take 20 mg by mouth daily.    Marland Kitchen VITAMIN C 250 MG PO TABS Oral Take 250 mg by mouth daily.      BP 110/68  Pulse 65  Temp 97.4 F (36.3 C) (Oral)  Resp 16  Ht 5\' 5"  (1.651 m)  Wt 160 lb (72.576 kg)  BMI 26.63 kg/m2  SpO2 100%  Physical Exam  Vitals reviewed. Constitutional: She appears well-developed and well-nourished. No distress.       Pt appears comfortable: reclining in bed asleep, easily rousable.   HENT:  Head: Normocephalic.  Eyes: Conjunctivae and EOM are normal. Pupils are equal, round, and reactive to light.  Cardiovascular: Normal rate and intact distal pulses.   Pulmonary/Chest: Effort normal.  Abdominal: Soft. Bowel sounds are normal.  Musculoskeletal: Normal range of motion. She exhibits no  edema and no tenderness.       Strait leg raise negative bilaterally  Neurological: She is alert.       Converses appropriately   Skin: No rash noted.       No evidence of venous insufficiency   Psychiatric: She has a normal mood and affect.    ED Course  Procedures (including critical care time)  Labs Reviewed  COMPREHENSIVE METABOLIC PANEL - Abnormal; Notable for the following:    Glucose, Bld 193 (*)     All other components within normal limits  CBC - Abnormal; Notable for the following:    RBC 5.40 (*)     MCH 25.4 (*)     All other components within normal limits   No results found.   1. Arthralgia       MDM  56 y/o female INAD c/o bilateral leg and back pain x2 months. No claudication. Distal pulses intact. Will refer to neurology for pain management.   Pt verbalized understanding and agrees with care plan. Outpatient follow-up and return precautions given.          Wynetta Emery, PA-C 06/02/12 1417

## 2012-06-02 NOTE — ED Notes (Signed)
Pt states she developed pain behind her knees, radiates up and down legs, settles more in her lower back and hips, no reported injuries, no history other than neuropathy in her feet, pt has no weakness on assessment and is able to ambulate without assist, positive pulse in bil feet.

## 2012-06-02 NOTE — ED Notes (Signed)
Pt continues to wait for provider, no change in status,

## 2012-06-02 NOTE — Discharge Instructions (Signed)
Follow with neurology for pain management. Return as needed for worsening of pain   Arthralgia Arthralgia is joint pain. A joint is a place where two bones meet. Joint pain can happen for many reasons. The joint can be bruised, stiff, infected, or weak from aging. Pain usually goes away after resting and taking medicine for soreness.  HOME CARE  Rest the joint as told by your doctor.   Keep the sore joint raised (elevated) for the first 24 hours.   Put ice on the joint area.   Put ice in a plastic bag.   Place a towel between your skin and the bag.   Leave the ice on for 15 to 20 minutes, 3 to 4 times a day.   Wear your splint, casting, elastic bandage, or sling as told by your doctor.   Only take medicine as told by your doctor. Do not take aspirin.   Use crutches as told by your doctor. Do not put weight on the joint until told to by your doctor.  GET HELP RIGHT AWAY IF:   You have bruising, puffiness (swelling), or more pain.   Your fingers or toes turn blue or start to lose feeling (numb).   Your medicine does not lessen the pain.   Your pain becomes severe.   You have a temperature by mouth above 102 F (38.9 C), not controlled by medicine.   You cannot move or use the joint.  MAKE SURE YOU:   Understand these instructions.   Will watch your condition.   Will get help right away if you are not doing well or get worse.  Document Released: 11/18/2009 Document Revised: 11/19/2011 Document Reviewed: 11/18/2009 [facility] Patient Information 2012 [place], [place].

## 2012-06-02 NOTE — ED Notes (Signed)
Pain started a couple of days ago. Pt stated that pain goes up her left leg posterior to the hip and back.

## 2012-06-03 NOTE — ED Provider Notes (Signed)
Medical screening examination/treatment/procedure(s) were performed by non-physician practitioner and as supervising physician I was immediately available for consultation/collaboration.  Cyndra Numbers, MD 06/03/12 1409

## 2012-07-14 ENCOUNTER — Other Ambulatory Visit: Payer: Self-pay

## 2012-07-14 DIAGNOSIS — Z48812 Encounter for surgical aftercare following surgery on the circulatory system: Secondary | ICD-10-CM

## 2012-07-14 DIAGNOSIS — I739 Peripheral vascular disease, unspecified: Secondary | ICD-10-CM

## 2012-08-10 ENCOUNTER — Encounter: Payer: Self-pay | Admitting: Vascular Surgery

## 2012-08-11 ENCOUNTER — Encounter (INDEPENDENT_AMBULATORY_CARE_PROVIDER_SITE_OTHER): Payer: Self-pay | Admitting: *Deleted

## 2012-08-11 ENCOUNTER — Encounter: Payer: Self-pay | Admitting: Vascular Surgery

## 2012-08-11 ENCOUNTER — Ambulatory Visit (INDEPENDENT_AMBULATORY_CARE_PROVIDER_SITE_OTHER): Payer: Self-pay | Admitting: Vascular Surgery

## 2012-08-11 VITALS — BP 133/84 | HR 62 | Temp 98.1°F | Resp 22 | Wt 154.5 lb

## 2012-08-11 DIAGNOSIS — I70219 Atherosclerosis of native arteries of extremities with intermittent claudication, unspecified extremity: Secondary | ICD-10-CM

## 2012-08-11 DIAGNOSIS — Z48812 Encounter for surgical aftercare following surgery on the circulatory system: Secondary | ICD-10-CM

## 2012-08-11 DIAGNOSIS — I739 Peripheral vascular disease, unspecified: Secondary | ICD-10-CM

## 2012-08-11 NOTE — Progress Notes (Signed)
Patient returns for followup today. She is a 56 year old female who previously underwent femoral-femoral bypass in July of 2011. This was done for claudication. She still has some claudication symptoms which she states are worse in the left leg currently. However her history is not completely consistent with claudication in that sometimes she gets cramps pain and aches in her legs while resting. This is not a history consistent with arterial occlusive disease. She states that she does get a cramping sensation in both calves after walking up a hill to her home. She does not have rest pain. She is not having any history of nonhealing ulcers.  She is still occasionally smoking cigarettes approximately 2-3 per day. She was counseled against this. Approximately 3 minutes were spent regarding smoking cessation counseling.  Review of systems: Denies shortness of breath, denies chest pain  Physical exam: Filed Vitals:   08/11/12 1432  BP: 133/84  Pulse: 62  Temp: 98.1 F (36.7 C)  TempSrc: Oral  Resp: 22  Weight: 154 lb 8 oz (70.081 kg)  SpO2: 100%   Lower extremities: 2+ femoral pulses bilaterally  Abdomen: Soft nontender no mass, palpable graft pulse  Feet: Absent pedal pulses bilaterally no open wounds  Data: Patient had bilateral ABIs performed today. ABI on the right was 0.5 to left was 0.652 pressure on the left was 39 temperature on the right is 15 she had monophasic waveforms bilaterally  Assessment: Patent femoral-femoral bypass with no significant decline ABIs since her last office visit. She does have mild claudication symptoms. Options were discussed with patient today with conservative management with a walking program and smoking cessation versus repeating arteriogram and consider and lower extremity bypass. She currently has opted for conservative management.  Plan: The patient will followup in one year for repeat ABIs. She will try to walk 30 minutes daily he will try to quit  smoking  Fabienne Bruns, MD [facility] Office: [phone] Pager: [phone]

## 2012-10-12 ENCOUNTER — Other Ambulatory Visit: Payer: Self-pay | Admitting: *Deleted

## 2012-10-12 DIAGNOSIS — I739 Peripheral vascular disease, unspecified: Secondary | ICD-10-CM

## 2012-10-12 DIAGNOSIS — Z48812 Encounter for surgical aftercare following surgery on the circulatory system: Secondary | ICD-10-CM

## 2012-10-31 DIAGNOSIS — I739 Peripheral vascular disease, unspecified: Secondary | ICD-10-CM

## 2013-06-06 ENCOUNTER — Encounter: Payer: Self-pay | Admitting: Internal Medicine

## 2013-06-06 ENCOUNTER — Ambulatory Visit (INDEPENDENT_AMBULATORY_CARE_PROVIDER_SITE_OTHER): Payer: Medicaid Other | Admitting: Internal Medicine

## 2013-06-06 VITALS — BP 108/67 | HR 55 | Temp 96.9°F | Ht 65.5 in | Wt 146.7 lb

## 2013-06-06 DIAGNOSIS — I10 Essential (primary) hypertension: Secondary | ICD-10-CM

## 2013-06-06 DIAGNOSIS — F17200 Nicotine dependence, unspecified, uncomplicated: Secondary | ICD-10-CM

## 2013-06-06 DIAGNOSIS — E785 Hyperlipidemia, unspecified: Secondary | ICD-10-CM

## 2013-06-06 DIAGNOSIS — E1169 Type 2 diabetes mellitus with other specified complication: Secondary | ICD-10-CM | POA: Insufficient documentation

## 2013-06-06 DIAGNOSIS — E119 Type 2 diabetes mellitus without complications: Secondary | ICD-10-CM

## 2013-06-06 DIAGNOSIS — Z1239 Encounter for other screening for malignant neoplasm of breast: Secondary | ICD-10-CM

## 2013-06-06 DIAGNOSIS — K219 Gastro-esophageal reflux disease without esophagitis: Secondary | ICD-10-CM

## 2013-06-06 LAB — BASIC METABOLIC PANEL WITH GFR
BUN: 11 mg/dL (ref 6–23)
CO2: 28 mEq/L (ref 19–32)
Glucose, Bld: 51 mg/dL — ABNORMAL LOW (ref 70–99)
Potassium: 3.4 mEq/L — ABNORMAL LOW (ref 3.5–5.3)
Sodium: 139 mEq/L (ref 135–145)

## 2013-06-06 LAB — LIPID PANEL
HDL: 42 mg/dL (ref >39)
LDL Cholesterol: 100 mg/dL — ABNORMAL HIGH (ref 0–99)
Total CHOL/HDL Ratio: 3.7 Ratio
Triglycerides: 66 mg/dL (ref <150)
VLDL: 13 mg/dL (ref 0–40)

## 2013-06-06 LAB — GLUCOSE, CAPILLARY: Glucose-Capillary: 55 mg/dL — ABNORMAL LOW (ref 70–99)

## 2013-06-06 MED ORDER — CALCIUM CITRATE-VITAMIN D 315-200 MG-UNIT PO TABS: 2.0000 | ORAL_TABLET | Freq: Two times a day (BID) | ORAL | Status: DC

## 2013-06-06 MED ORDER — OMEPRAZOLE 20 MG PO CPDR: 20.0000 mg | DELAYED_RELEASE_CAPSULE | ORAL | Status: DC | PRN

## 2013-06-06 MED ORDER — METFORMIN HCL 500 MG PO TABS: 500.0000 mg | ORAL_TABLET | Freq: Two times a day (BID) | ORAL | Status: DC

## 2013-06-06 MED ORDER — INSULIN GLARGINE 100 UNIT/ML ~~LOC~~ SOLN: 15.0000 [IU] | Freq: Every day | SUBCUTANEOUS | Status: DC

## 2013-06-06 MED ORDER — ROSUVASTATIN CALCIUM 5 MG PO TABS: 5.0000 mg | ORAL_TABLET | Freq: Every day | ORAL | Status: DC

## 2013-06-06 MED ORDER — LISINOPRIL-HYDROCHLOROTHIAZIDE 20-25 MG PO TABS: 1.0000 | ORAL_TABLET | Freq: Every day | ORAL | Status: DC

## 2013-06-06 NOTE — Progress Notes (Signed)
Case discussed with Dr. Heloise Beecham at the time of the visit.  We reviewed the resident's history and exam and pertinent patient test results.  I agree with the assessment, diagnosis, and plan of care documented in the resident's note.  Family history will be documented at next visit.

## 2013-06-06 NOTE — Assessment & Plan Note (Signed)
Stable on prn omeprazole.  

## 2013-06-06 NOTE — Assessment & Plan Note (Signed)
On crestor 5mg  daily. Will check lipid panel today.

## 2013-06-06 NOTE — Progress Notes (Signed)
Patient ID: Angie Cain, female   DOB: 07-25-1956, 57 y.o.   MRN: [id number]  Subjective:   Patient ID: Angie Cain female   DOB: [birth year] 57 y.o.   MRN: [id number]  HPI: [name] is a 57 y.o. female with history of PVD, HTN, T2DM presenting to the clinic to establish care, [facility].  In regards to her DM, she takes lantus 20U in the evening. Was on mealtime novolog 2-11U novolog TID  SSI, but hasn't taken it in 1 month. Takes metfomin 500mg  once daily, sometimes twice. Last HbA1c in our system is from 2011 (11.3), has not seen MD since that time. Checks blood sugar TID. Range 60-200, mostly in 100s. Meter interrogation confirms lowest BG 61, occuring in am. Occasional symptoms of hypoglycemia including palpitations, diaphoresis. This am fasting CBG 55.   Has a retinal specialist she follows w for annual exam. Takes gabapentin for DM neuropathy.  In regards to PVD, she is s/p fem-fem bypass 06/2010 by Dr. Darrick Penna. Last seen in his clinic in Aug 2013, still w some claudication sx. ABI at that time  0.5 right and 0.652 left. Plan to proceed with conservative therapy, including goal of walking 30 min daily and smoking cessation. She reports trying to cut back to 3-5 cigs/day and compliance w daily walking. Does not want NRT or meds, wants to quit "on my own."  For her HTN, she takes lisinopril-HCTZ 20-25mg  daily.  In terms of health maintenance, due for repeat MMG in a few months. Pap smear UTD through 2012, now she is s/p hysterectomy. Due for colon cancer screening. DEXA in 2012 w T score -2.2, osteopenia. FRAX 10 yr risk any fracture 7%, 70yr risk hip fx 1%.    Past Medical History  Diagnosis Date  . Diabetes mellitus   . Atherosclerotic peripheral vascular disease with intermittent claudication     s/p fem-fem bypass 2011. ABI 07/2012 0.5 R 0.65 L  . Hypertension    Current Outpatient Prescriptions  Medication Sig Dispense Refill  . cholecalciferol  (VITAMIN D) 1000 UNITS tablet Take 1,000 Units by mouth daily.      Marland Kitchen gabapentin (NEURONTIN) 300 MG capsule Take 300 mg by mouth 3 (three) times daily.      . insulin aspart (NOVOLOG) 100 UNIT/ML injection Inject 2-11 Units into the skin See admin instructions. Per sliding scale      . insulin glargine (LANTUS) 100 UNIT/ML injection Inject 37 Units into the skin at bedtime.      Marland Kitchen lisinopril (PRINIVIL,ZESTRIL) 20 MG tablet Take 20 mg by mouth daily.      . vitamin C (ASCORBIC ACID) 250 MG tablet Take 250 mg by mouth daily.       No current facility-administered medications for this visit.   No family history on file. History   Social History  . Marital Status: Married    Spouse Name: N/A    Number of Children: N/A  . Years of Education: N/A   Social History Main Topics  . Smoking status: Current Some Day Smoker -- 0.10 packs/day  . Smokeless tobacco: None  . Alcohol Use: No  . Drug Use: No  . Sexually Active: None   Other Topics Concern  . None   Social History Narrative  . None   Review of Systems: 10 pt ROS performed, pertinent positives and negatives noted in HPI Objective:  Physical Exam: Filed Vitals:   06/06/13 1015  BP: 108/67  Pulse: 55  Temp: 96.9 F (  36.1 C)  TempSrc: Oral  Height: 5' 5.5" (1.664 m)  Weight: 146 lb 11.2 oz (66.543 kg)  SpO2: 100%   Constitutional: Vital signs reviewed.  Patient is a fit woman in no acute distress and cooperative with exam. Alert and oriented x3.  Head: Normocephalic and atraumatic Ear: TM normal bilaterally Mouth: no erythema or exudates, MMM Eyes: PERRL, EOMI, conjunctivae normal, No scleral icterus.  Neck: Supple, Trachea midline normal ROM; no JVD, mass, thyromegaly.  Cardiovascular: RRR, S1 normal, S2 normal, no MRG  Pulmonary/Chest: CTAB, no wheezes, rales, or rhonchi Abdominal: Soft. Non-tender, non-distended, bowel sounds are normal, no masses, organomegaly, or guarding present.  GU: no CVA  tenderness Musculoskeletal: No joint deformities, erythema, or stiffness, ROM full and no nontender Hematology: no cervical adenopathy or visible bruising Neurological: A&O x3, Strength is normal and symmetric bilaterally, cranial nerve II-XII are grossly intact, no focal motor deficit, sensory intact to light touch bilaterally.  Skin: Warm, dry and intact. No rash, cyanosis, or clubbing.  Psychiatric: Normal mood and affect. Ext: Difficult to palpate DP pulses bilaterally Assessment & Plan:   Please see problem-based charting for assessment and plan.

## 2013-06-06 NOTE — Assessment & Plan Note (Signed)
BP Readings from Last 3 Encounters:  06/06/13 108/67  08/11/12 133/84  06/02/12 134/78    Lab Results  Component Value Date   NA 137 06/02/2012   K 3.6 06/02/2012   CREATININE 0.76 06/02/2012    Assessment: Blood pressure control: controlled Progress toward BP goal:  at goal Comments: lisinopril-HCTZ 20-25mg  daily Plan: Medications:  continue current medications Educational resources provided: brochure

## 2013-06-06 NOTE — Assessment & Plan Note (Signed)
  Assessment: Progress toward smoking cessation:  smoking the same amount 3-5 cigs/day Barriers to progress toward smoking cessation:  other tobacco users at [facility] Comments: Wants to quit "on my own," refuses NRT, meds  Plan: Instruction/counseling given:  I counseled patient on the dangers of tobacco use, advised patient to stop smoking, and reviewed strategies to maximize success. Educational resources provided:  [facility] (1-800-QUIT-NOW) brochure Medications to assist with smoking cessation:  None

## 2013-06-06 NOTE — Patient Instructions (Addendum)
General Instructions: 1. STOP TAKING novolog insulin with meals. DECREASE lantus to 15 units ONCE a day. INCREASE metformin to 1 pill twice a day. Come back to see doctor in 1 month, and diabetes educator within 1 week. Please follow up with eye doctor tomorrow. Keep checking your sugars three times a day. Call us if less than 70 and hold your insulin. Bring your meter to ALL of your appointments. 2. We will make an appointment for your mammogram.  3. We will refer for colonoscopy at next visit.   Treatment Goals:  Goals (1 Years of Data) as of 06/06/13         As of Today 08/11/12 06/02/12     Blood Pressure    . Blood Pressure < 140/90  108/67 133/84 134/78      Progress Toward Treatment Goals:  Treatment Goal 06/06/2013  Hemoglobin A1C at goal  Blood pressure at goal  Stop smoking smoking the same amount    Self Care Goals & Plans:  Self Care Goal 06/06/2013  Eat healthy foods drink diet soda or water instead of juice or soda; eat more vegetables; eat foods that are low in salt; eat baked foods instead of fried foods    Home Blood Glucose Monitoring 06/06/2013  Check my blood sugar 3 times a day  When to check my blood sugar before breakfast; at bedtime; before meals     Care Management & Community Referrals:  Referral 06/06/2013  Referrals made for care management support diabetes educator

## 2013-06-06 NOTE — Assessment & Plan Note (Signed)
Lab Results  Component Value Date   HGBA1C 7.2 06/06/2013   HGBA1C  Value: 11.3 (NOTE)                                                                       According to the ADA Clinical Practice Recommendations for 2011, when HbA1c is used as a screening test:   >=6.5%   Diagnostic of Diabetes Mellitus           (if abnormal result  is confirmed)  5.7-6.4%   Increased risk of developing Diabetes Mellitus  References:Diagnosis and Classification of Diabetes Mellitus,Diabetes Care,2011,34(Suppl 1):S62-S69 and Standards of Medical Care in         Diabetes - 2011,Diabetes Care,2011,34  (Suppl 1):S11-S61.* 07/02/2010   HGBA1C 9.0 02/15/2007   HGBA1C 9.0 02/15/2007     Assessment: Diabetes control: good control (HgbA1C at goal) Progress toward A1C goal:  at goal Comments: IMprovement of hyperglycemia but now with hypoglycemia (CBG 55 in clinic) and pattern of am hypoglycemia per home meter (lowest CBG 61).  Plan: Medications:  STOP mealtime novolog. Decrease lantus to 15U qhs. Increase metformin to 500BID Home glucose monitoring: Frequency: 3 times a day Timing: before breakfast;at bedtime;before meals Instruction/counseling given: reminded to get eye exam, reminded to bring blood glucose meter & log to each visit and reminded to bring medications to each visit Educational resources provided: brochure Self management tools provided: copy of home glucose meter download;home glucose logbook Other plans: Return to clinic for visit w DP next week, to see MD in 1 month. Instructed to call if hypoglycemia CBG <70.

## 2013-06-07 ENCOUNTER — Encounter: Payer: Self-pay | Admitting: Internal Medicine

## 2013-06-09 ENCOUNTER — Telehealth: Payer: Self-pay | Admitting: *Deleted

## 2013-06-09 NOTE — Telephone Encounter (Signed)
RTC to pt regarding low glucose level.  No answer.  Message left to stop Novalog as discussed and to go to Lantus 15 units.  If lows continue go to 10 units as previously discussed per Dr. Loura Pardon.  Angelina Ok, RN 06/09/2013 5:00 PM.

## 2013-06-13 ENCOUNTER — Encounter: Payer: Self-pay | Admitting: Dietician

## 2013-06-13 ENCOUNTER — Ambulatory Visit (INDEPENDENT_AMBULATORY_CARE_PROVIDER_SITE_OTHER): Payer: Medicaid Other | Admitting: Dietician

## 2013-06-13 VITALS — Wt 143.8 lb

## 2013-06-13 DIAGNOSIS — E1149 Type 2 diabetes mellitus with other diabetic neurological complication: Secondary | ICD-10-CM

## 2013-06-13 NOTE — Progress Notes (Signed)
Diabetes Self-Management Education  Visit Number: First/Initial  06/13/2013 [name], identified by name and date of birth, is a 57 y.o. female with Type 2 diabetes  .   ASSESSMENT  Patient Concerns:     Weight 143 lb 12.8 oz (65.227 kg). Body mass index is 23.56 kg/(m^2).  Lab Results: LDL Cholesterol  Date Value Range Status  06/06/2013 100* 0 - 99 mg/dL Final           Total Cholesterol/HDL Ratio:CHD Risk                            Coronary Heart Disease Risk Table                                            Men       Women              1/2 Average Risk              3.4        3.3                  Average Risk              5.0        4.4               2X Average Risk              9.6        7.1               3X Average Risk             23.4       11.0     Use the calculated Patient Ratio above and the CHD Risk table      to determine the patient's CHD Risk.     ATP III Classification (LDL):           < 100        mg/dL         Optimal          100 - 129     mg/dL         Near or Above Optimal          130 - 159     mg/dL         Borderline High          160 - 189     mg/dL         High           > 190        mg/dL         Very High           Hemoglobin A1C  Date Value Range Status  06/06/2013 7.2   Final  07/02/2010 11.3 (NOTE)                                                                       According to the ADA Clinical  Practice Recommendations for 2011, when HbA1c is used as a screening test:   >=6.5%   Diagnostic of Diabetes Mellitus           (if abnormal result  is confirmed)  5.7-6.4%   Increased risk of developing Diabetes Mellitus  References:Diagnosis and Classification of Diabetes Mellitus,Diabetes Care,2011,34(Suppl 1):S62-S69 and Standards of Medical Care in         Diabetes - 2011,Diabetes Care,2011,34  (Suppl 1):S11-S61.* <5.7 % Final     Family History  Problem Relation Age of Onset  . Diabetes Mother   . Diabetes Father   . Diabetes Sister    . Hypertension Sister   . Diabetes Brother   . Hypertension Brother    History  Substance Use Topics  . Smoking status: Current Some Day Smoker -- 0.10 packs/day    Types: Cigarettes  . Smokeless tobacco: Not on file     Comment: Struggled w smoking cessation. Didn't want to take Chantix. Stress motivates use. Smokes about 3-5 cigs/day.  . Alcohol Use: 0.0 oz/week    1-2 Glasses of wine per week    Support Systems:    Special Needs:     Prior DM Education:   Daily Foot Exams:   Patient Belief / Attitude about Diabetes:     Assessment comments: patient individual assessment to be scanned to chart.   Diet Recall: eats 3x a day, HBE, sausage & oatmeal for breakfast, [place] sandwich for lunch, eats away from home 5 days a week for dinner, but tries to get baked or grilled meats and vegetables, snacks on chips, crackers. Drinks tea, water and coffee   Individualized Plan for Diabetes Self-Management Training: Patient individualized diabetes plan discussed today with patient and includes:  Nutrition, monitoring, physical activity, acute complications  Education Topics Reviewed with Patient Today:  Topic Points Discussed  Disease State    Nutrition Management    Physical Activity and Exercise    Medications    Monitoring    Acute Complications    Chronic Complications    Psychosocial Adjustment Helped patient identify a support system for diabetes management;Identified and addressed patients feelings and concerns about diabetes  Goal Setting Review risk of smoking and offered smoking cessation;Helped patient develop diabetes management plan for beginning exercise.   Preconception Care (if applicable)      PATIENTS GOALS   Learning Objective(s):     Goal The patient agrees to:  Nutrition    Physical Activity    Medications    Monitoring Bring meter to next appointment and keep record x 24 hours of all food and beverages consumed  Problem Solving    Reducing Risk     Health Coping     Patient Self-Evaluation of Goals (Subsequent Visits)  Goal The patient rates self as meeting goals (% of time)  Nutrition    Physical Activity    Medications    Monitoring    Problem Solving    Reducing Risk    Health Coping       PERSONALIZED PLAN / SUPPORT  Self-Management Support:     Doctor office, CDE appointments, friends ______________________________________________________________________  Outcomes  Expected Outcomes:  Demonstrated interest in learning. Expect positive outcomes Self-Care Barriers:  Lack of material resources Education material provided: yes If problems or questions, patient to contact team via:  Phone Time in: 1030     Time out: 1130 Future DSME appointment: - 2 wks   Angie Cain, Angie Cain 06/13/2013 1:56 PM

## 2013-06-13 NOTE — Patient Instructions (Addendum)
Please make a follow up on 2 weeks- please bring your meter.  Please record what you eat for one day before your next visit. - Bring any empty food labels you'd like to discuss/look at at our next visit.   Please try to eat about 3 carb choices at each meal and 1 for snacks    Examples:  2 starch + 1 fruit = 3 carb choices    1 starch + 1 milk + 1 fruit = 3 carb choices    2 starch + 1 milk = 3 carb choices    3 starches = 3 carb choices  Veggies and meats do not count- add them as needed.  Eat at least 3 meals and 1-2 snacks per day.  Aim for no more than 5 hours between eating.

## 2013-06-15 ENCOUNTER — Ambulatory Visit (HOSPITAL_COMMUNITY)
Admission: RE | Admit: 2013-06-15 | Discharge: 2013-06-15 | Disposition: A | Discharge status: Home / Self Care | Payer: Medicaid Other | Source: Ambulatory Visit | Attending: Internal Medicine | Admitting: Internal Medicine

## 2013-06-15 DIAGNOSIS — Z1231 Encounter for screening mammogram for malignant neoplasm of breast: Secondary | ICD-10-CM

## 2013-06-15 DIAGNOSIS — Z1239 Encounter for other screening for malignant neoplasm of breast: Secondary | ICD-10-CM

## 2013-06-22 ENCOUNTER — Other Ambulatory Visit: Payer: Self-pay

## 2013-06-28 ENCOUNTER — Ambulatory Visit (INDEPENDENT_AMBULATORY_CARE_PROVIDER_SITE_OTHER): Payer: Medicaid Other | Admitting: Dietician

## 2013-06-28 VITALS — Wt 145.6 lb

## 2013-06-28 DIAGNOSIS — E1149 Type 2 diabetes mellitus with other diabetic neurological complication: Secondary | ICD-10-CM

## 2013-06-28 NOTE — Patient Instructions (Addendum)
Healthy snacks:  Low carb vegetables like- any type of green or lettuce,carrots, cucumbers, celery, okra, tomatoes, nuts  Low carb foods: low fat proteins- light cheese sticks, cottage cheese, hard boiled egg, avocados, olives  Please make a follow up appointment for 3-4 weeks

## 2013-06-29 NOTE — Progress Notes (Signed)
Diabetes Self-Management Education  Visit Number: Follow-up 1  06/29/2013 [name], identified by name and date of birth, is a 57 y.o. female withType 2 diabetes       ASSESSMENT  Patient Concerns:     Weight 145 lb 9.6 oz (66.044 kg). Body mass index is 23.85 kg/(m^2).  Lab Results: LDL Cholesterol  Date Value Range Status  06/06/2013 100* 0 - 99 mg/dL Final                 Hemoglobin A1C  Date Value Range Status  06/06/2013 7.2   Final     Family History  Problem Relation Age of Onset  . Diabetes Mother   . Diabetes Father   . Diabetes Sister   . Hypertension Sister   . Diabetes Brother   . Hypertension Brother    History  Substance Use Topics  . Smoking status: Current Some Day Smoker -- 0.10 packs/day    Types: Cigarettes  . Smokeless tobacco: Not on file     Comment: Struggled w smoking cessation. Didn't want to take Chantix. Stress motivates use. Smokes about 3-5 cigs/day.  . Alcohol Use: 0.0 oz/week    1-2 Glasses of wine per week    Support Systems:   friends and office staff Special Needs:    no Prior DM Education:  yes Daily Foot Exams:  yes Patient Belief / Attitude about Diabetes:   wants A1C at a "6"  Assessment comments: patient identifies night eating as being problem to achieving her goals. Meter download concurs with this. Verbalized  Between behavior and goals for self.    Diet Recall: eating moderate portions, fairly well balanced meals.  reads labels. Blood sugars are higher than a few weeks ago with nighttime being the highest. Average of 7 days 161, 14 days 154 and 30 days 144. Worked with patient on plan to improve nutrient content and decrease carbs in nighttime snacks.     Individualized Plan for Diabetes Self-Management Training:  Patient individualized diabetes plan discussed today with patient and includes: Nutrition, monitoring, physical activity, acute complications   Education Topics Reviewed with Patient  Today:  Topic Points Discussed  Disease State    Nutrition Management Role of diet in the treatment of diabetes and the relationship between the three main macronutrients and blood glucose level;Food label reading, portion sizes and measuring food.;Carbohydrate counting;Meal timing in regards to the patients' current diabetes medication.;Information on hints to eating out and maintain blood glucose control.  Physical Activity and Exercise    Medications Reviewed patients medication for diabetes, action, purpose, timing of dose and side effects.  Monitoring    Acute Complications    Chronic Complications    Psychosocial Adjustment    Goal Setting    Preconception Care (if applicable)      PATIENTS GOALS   Learning Objective(s):     Goal The patient agrees to:  Nutrition Follow meal plan discussed  Physical Activity    Medications    Monitoring    Problem Solving    Reducing Risk    Health Coping     Patient Self-Evaluation of Goals (Subsequent Visits)  Goal The patient rates self as meeting goals (% of time)  Nutrition 50 - 75 %  Physical Activity    Medications    Monitoring    Problem Solving    Reducing Risk    Health Coping       PERSONALIZED PLAN / SUPPORT  Self-Management Support:  Doctor's  office;Friends;CDE visits  Consider referral to social work in future ______________________________________________________________________  Outcomes  Expected Outcomes:  Demonstrated interest in learning. Expect positive outcomes Self-Care Barriers:  Lack of material resources Education material provided: yes If problems or questions, patient to contact team via:  Phone Time in: 1030     Time out: 1130  Future DSME appointment: - 4-6 wks   Plyler, Lupita Leash 06/29/2013 4:07 PM

## 2013-07-03 ENCOUNTER — Encounter: Payer: Medicaid Other | Admitting: Internal Medicine

## 2013-07-17 ENCOUNTER — Encounter: Payer: Medicaid Other | Admitting: Internal Medicine

## 2013-07-26 ENCOUNTER — Ambulatory Visit (INDEPENDENT_AMBULATORY_CARE_PROVIDER_SITE_OTHER): Payer: Medicaid Other | Admitting: Dietician

## 2013-07-26 VITALS — Wt 143.7 lb

## 2013-07-26 DIAGNOSIS — E1149 Type 2 diabetes mellitus with other diabetic neurological complication: Secondary | ICD-10-CM

## 2013-07-26 NOTE — Patient Instructions (Addendum)
Please make a follow up appointment for 3-4 weeks.  Goals: try to eat lower carb for breakfast (try more eggs, [place] sausage, cottage cheese instead of cold cereal)to keep your blood sugar under 180 at most times.   Good carbs: higher fiber, more fruits and veggies

## 2013-07-26 NOTE — Progress Notes (Signed)
Diabetes Self-Management Education  Visit Number: Follow-up 2  07/26/2013 Ms. [name], identified by name and date of birth, is a 57 y.o. female with Type 2 diabetes  .     ASSESSMENT  Patient Concerns:  Nutrition/Meal planning;Glycemic Control  Weight 143 lb 11.2 oz (65.182 kg). Body mass index is 23.54 kg/(m^2).  Lab Results: LDL Cholesterol  Date Value Range Status  06/06/2013 100* 0 - 99 mg/dL Final                 Hemoglobin A1C  Date Value Range Status  06/06/2013 7.2   Final  07/02/2010 11.3 (NOTE)                                                                       <5.7 % Final     Family History  Problem Relation Age of Onset  . Diabetes Mother   . Diabetes Father   . Diabetes Sister   . Hypertension Sister   . Diabetes Brother   . Hypertension Brother    History  Substance Use Topics  . Smoking status: Current Some Day Smoker -- 0.10 packs/day    Types: Cigarettes  . Smokeless tobacco: Not on file     Comment: Struggled w smoking cessation. Didn't want to take Chantix. Stress motivates use. Smokes about 3-5 cigs/day.  . Alcohol Use: 0.0 oz/week    1-2 Glasses of wine per week    Assessment comments:Encouraged higher nutrient density and lower carb  foods using dash diet.  Reviewed meter download: average 7 day= 121, 14 day=131 and 30 day 140, fasting blood sugars in target, PM blood sugars above target ~ 50% of time.  Patient requests extended release metformin so she can take it in am because she is missing evening doses due to fear of hypoglycemia with lantus in PM. Continues to request weekly vitamin D pills instead of daily.    Diet Recall: CBGs 97  Fasting today, ate 3/4 cup cereal, 1/2 cup milk, 1/2 banana and chamomile tea and was >200~ 90 minutes after here in office. We discussed possible solution.      Individualized Plan for Diabetes Self-Management Training:  Patient individualized diabetes plan: Nutrition, medications, physical  activity, monitoring, how to handle highs and lows  Education Topics Reviewed with Patient Today:  Topic Points Discussed  Disease State    Nutrition Management Food label reading, portion sizes and measuring food.;Carbohydrate counting;Reviewed blood glucose goals for pre and post meals and how to evaluate the patients' food intake on their blood glucose level.  Physical Activity and Exercise    Medications Reviewed patients medication for diabetes, action, purpose, timing of dose and side effects.  Monitoring Taught/discussed recording of test results and interpretation of SMBG.  Acute Complications    Chronic Complications    Psychosocial Adjustment Worked with patient to identify barriers to care and solutions  Goal Setting    Preconception Care (if applicable)      PATIENTS GOALS   Learning Objective(s):     Goal The patient agrees to:  Nutrition Follow meal plan discussed  Physical Activity    Medications    Monitoring    Problem Solving    Reducing Risk    Health  Coping     Patient Self-Evaluation of Goals (Subsequent Visits)  Goal The patient rates self as meeting goals (% of time)  Nutrition 50 - 75 %  Physical Activity    Medications    Monitoring >75%  Problem Solving    Reducing Risk    Health Coping       PERSONALIZED PLAN / SUPPORT  Self-Management Support:  Doctor's office;CDE visits  ______________________________________________________________________  Outcomes  Expected Outcomes:   Improved A1C and nutritional status Self-Care Barriers:  Lack of material resources Education material provided: yes If problems or questions, patient to contact team via:  Phone Time in: 1030     Time out: 1130  Future DSME appointment: - 4-6 wks   Angelic Schnelle, Lupita Leash 07/26/2013 12:04 PM

## 2013-07-27 ENCOUNTER — Telehealth: Payer: Self-pay | Admitting: Dietician

## 2013-07-27 NOTE — Telephone Encounter (Signed)
Patient reported that she had had her eye exam at dr. Laruth Bouchard office. Called for report. Her exam was on June 14, 2013. They are faxing it to [place] today.

## 2013-08-11 ENCOUNTER — Ambulatory Visit: Payer: Self-pay | Admitting: Family

## 2013-08-17 ENCOUNTER — Ambulatory Visit: Payer: Self-pay | Admitting: Family

## 2013-08-23 ENCOUNTER — Encounter: Payer: Self-pay | Admitting: Family

## 2013-08-23 ENCOUNTER — Encounter: Payer: Medicaid Other | Admitting: Dietician

## 2013-08-24 ENCOUNTER — Ambulatory Visit: Payer: Self-pay | Admitting: Family

## 2013-09-13 ENCOUNTER — Encounter: Payer: Self-pay | Admitting: Family

## 2013-09-14 ENCOUNTER — Ambulatory Visit: Payer: Self-pay | Admitting: Family

## 2013-09-18 ENCOUNTER — Ambulatory Visit (INDEPENDENT_AMBULATORY_CARE_PROVIDER_SITE_OTHER): Payer: Medicaid Other | Admitting: Internal Medicine

## 2013-09-18 ENCOUNTER — Encounter: Payer: Self-pay | Admitting: Internal Medicine

## 2013-09-18 VITALS — BP 137/86 | HR 74 | Temp 97.6°F | Ht 65.5 in | Wt 140.8 lb

## 2013-09-18 DIAGNOSIS — Z23 Encounter for immunization: Secondary | ICD-10-CM

## 2013-09-18 DIAGNOSIS — E1149 Type 2 diabetes mellitus with other diabetic neurological complication: Secondary | ICD-10-CM

## 2013-09-18 DIAGNOSIS — E785 Hyperlipidemia, unspecified: Secondary | ICD-10-CM

## 2013-09-18 DIAGNOSIS — F17200 Nicotine dependence, unspecified, uncomplicated: Secondary | ICD-10-CM

## 2013-09-18 DIAGNOSIS — E119 Type 2 diabetes mellitus without complications: Secondary | ICD-10-CM

## 2013-09-18 DIAGNOSIS — I10 Essential (primary) hypertension: Secondary | ICD-10-CM

## 2013-09-18 DIAGNOSIS — L299 Pruritus, unspecified: Secondary | ICD-10-CM

## 2013-09-18 LAB — GLUCOSE, CAPILLARY: Glucose-Capillary: 203 mg/dL — ABNORMAL HIGH (ref 70–99)

## 2013-09-18 MED ORDER — INSULIN GLARGINE 100 UNIT/ML ~~LOC~~ SOLN: 15.0000 [IU] | Freq: Every day | SUBCUTANEOUS | Status: DC

## 2013-09-18 MED ORDER — LISINOPRIL-HYDROCHLOROTHIAZIDE 20-25 MG PO TABS: 1.0000 | ORAL_TABLET | Freq: Every day | ORAL | Status: DC

## 2013-09-18 MED ORDER — HYDROXYZINE HCL 10 MG PO TABS: 10.0000 mg | ORAL_TABLET | Freq: Three times a day (TID) | ORAL | Status: DC | PRN

## 2013-09-18 MED ORDER — METFORMIN HCL 850 MG PO TABS: 850.0000 mg | ORAL_TABLET | Freq: Two times a day (BID) | ORAL | Status: DC

## 2013-09-18 MED ORDER — ROSUVASTATIN CALCIUM 5 MG PO TABS: 5.0000 mg | ORAL_TABLET | Freq: Every day | ORAL | Status: DC

## 2013-09-18 MED ORDER — METFORMIN HCL 500 MG PO TABS: 500.0000 mg | ORAL_TABLET | Freq: Two times a day (BID) | ORAL | Status: DC

## 2013-09-18 NOTE — Patient Instructions (Addendum)
Thank you for your visit. Please return to clinic in three months.   We increased your metformin to 850mg  twice per day. Please make this alteration in your medication regimen.   We gave you hydroxyzine to take as needed for itch. This medication can make you drowsy, so please do not drive or operate any heavy machinery while taking this medication.  Please complete the stool cards and return them to clinic as soon as you have finished.

## 2013-09-18 NOTE — Assessment & Plan Note (Addendum)
Lab Results  Component Value Date   HGBA1C 7.5 09/18/2013   HGBA1C 7.2 06/06/2013   HGBA1C  Value: 11.3 (NOTE)                                                                       According to the ADA Clinical Practice Recommendations for 2011, when HbA1c is used as a screening test:   >=6.5%   Diagnostic of Diabetes Mellitus           (if abnormal result  is confirmed)  5.7-6.4%   Increased risk of developing Diabetes Mellitus  References:Diagnosis and Classification of Diabetes Mellitus,Diabetes Care,2011,34(Suppl 1):S62-S69 and Standards of Medical Care in         Diabetes - 2011,Diabetes Care,2011,34  (Suppl 1):S11-S61.* 07/02/2010     Assessment: Diabetes control: fair control Progress toward A1C goal:  unchanged Comments: patient with better control of her glucose over the past few months. However, given patient's relatively young and has a long life expectancy I would like to see her hemoglobin A1c more well-controlled.  Plan: Medications:  metformin 850mg  bid, lantus 15units qHS Home glucose monitoring: Frequency: 3 times a day Timing:   Instruction/counseling given: reminded to get eye exam, reminded to bring blood glucose meter & log to each visit, reminded to bring medications to each visit and discussed diet Educational resources provided:   Self management tools provided:   Other plans: Today we will increase her metformin to 850 mg twice daily from 500 mg twice daily. She will continue to take Lantus 15 units at night. If patient's blood glucose continues to be elevated at her next visit, I will consider increasing her Lantus dose. She is not in need of refills for gabapentin at this time.  She also received urine microalbumin creatinine ratio. I refilled patient's Crestor, metformin, Lantus today.  In terms of other health maintenance issues, patient is refusing colonoscopy at this time. However, she is willing to use stool cards. Patient was provided with the stool cards and  instructed to bring them back when she had completed them. Patient received flu shot today.

## 2013-09-18 NOTE — Assessment & Plan Note (Signed)
I suspect that patient's itching is due to her recent shrimp intake. There is no rash on physical exam. I prescribed hydroxyzine 10mg  q3h prn today.

## 2013-09-18 NOTE — Assessment & Plan Note (Signed)
BP Readings from Last 3 Encounters:  09/18/13 137/86  06/06/13 108/67  08/11/12 133/84    Lab Results  Component Value Date   NA 139 06/06/2013   K 3.4* 06/06/2013   CREATININE 0.73 06/06/2013    Assessment: Blood pressure control: mildly elevated Progress toward BP goal:  deteriorated Comments: Patient had been without her lisinopril-HCTZ for a couple of weeks. It is not unexpected that her blood pressure would be mildly elevated today.  Plan: Medications:  continue current medications Educational resources provided:   Self management tools provided:   Other plans: Patient will continue lisinopril-HCTZ for now.

## 2013-09-18 NOTE — Assessment & Plan Note (Addendum)
Patient is in the process of cutting back. She is smoking a fourth of a pack of cigarettes per day now. She reports that her husband does not smoke cigarettes and that she would like to quit. However, this is difficult given she has been smoking for 30+ years. I continue to encourage the patient to continue to her efforts of smoking cessation.

## 2013-09-18 NOTE — Progress Notes (Signed)
Patient ID: Angie Cain, female   DOB: 11-02-1956, 57 y.o.   MRN: [id number] HPI The patient is a 57 y.o. female with a history of PVD s/p fem-fem bypass in 2011, HTN, T2DM presenting to the clinic for a routine f/u appt.  DM- patient's hemoglobin A1c today is 7.5%. She takes Lantus 15 units at night and metformin 500 mg twice a day. She reports compliance with the metformin, but at times forgets to take her Lantus at night. Patient does own a glucometer at home and takes her blood sugar 3 times per day however she did not bring it today. She reports having elevated blood sugar at home up to 300s recently. Patient reports having occasional episodes of feeling tremulous with low blood sugars, though these episodes resolves quickly after patient eats. Patient has history of peripheral neuropathy and takes gabapentin for her symptoms. Patient is prescribed gabapentin 300 mg 3 times a day, however patient only takes this medication as needed. Patient reports much improvement of her peripheral neuropathy in the past year or so.  HTN-patient takes lisinopril-HCTZ at home, however she has been out of this prescription for a couple of weeks. Blood pressure today is 137/86.   PVD-patient is s/p fem-fem bypass 06/2010 and reports much improvement of her claudication symptoms since the surgery. However, patient reports she has pain to the top of her thighs as well as her bilateral calves at times when she walks long distances. Patient states that this pain resolves easily with rest. She is able to walk "a lot" without symptoms.  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 She has had an increased intake of shrimp over the past few days. She is requesting something to help the itch.   HLD- Patient is on Crestor 5 mg daily. However, patient has been out of this medication since June. She is willing to start retaking it if it is refilled today.   Tobacco  Use-Patient smokes one fourth of a pack of cigarettes per day. She has smoked much more than this over the past 30 years. She is attempting to cut back.  Health maintenance- patient has not had a colonoscopy yet and she is 57 years old. She reports that she does not want a colonoscopy yet, but she is willing to complete the stool cards. Patient received flu shot today.  ROS: General: no fevers, chills, changes in weight, changes in appetite Skin: no rash HEENT: no blurry vision, hearing changes, sore throat Pulm: no dyspnea, coughing, wheezing CV: no chest pain, palpitations, shortness of breath Abd: no abdominal pain, nausea/vomiting, diarrhea/constipation GU: no dysuria, hematuria, polyuria Ext: See HPI Neuro: see HPI  Filed Vitals:   09/18/13 1413  BP: 137/86  Pulse: 74  Temp: 97.6 F (36.4 C)   Physical Exam General: thin, alert, cooperative, and in no apparent distress HEENT: pupils equal round and reactive to light, vision grossly intact, oropharynx clear and non-erythematous  Neck: supple, no lymphadenopathy, JVD Lungs: clear to ascultation bilaterally, normal work of respiration, no wheezes, rales, ronchi Heart: regular rate and rhythm, no murmurs, gallops, or rubs Abdomen: thin, soft, non-tender, non-distended, normal bowel sounds Extremities: warm extremities bilaterally, no BLE edema Neurologic: alert & oriented X3, cranial nerves II-XII intact, strength grossly intact, sensation intact to light touch  Current Outpatient Prescriptions on File Prior to Visit  Medication Sig Dispense Refill  . calcium citrate-vitamin D (CITRACAL+D) 315-200 MG-UNIT per tablet Take 2 tablets by  mouth 2 (two) times daily.  100 tablet  3  . gabapentin (NEURONTIN) 300 MG capsule Take 300 mg by mouth 3 (three) times daily.      Marland Kitchen omeprazole (PRILOSEC) 20 MG capsule Take 1 capsule (20 mg total) by mouth as needed.  30 capsule  1  . vitamin C (ASCORBIC ACID) 250 MG tablet Take 250 mg by mouth  daily.       No current facility-administered medications on file prior to visit.    Assessment/Plan

## 2013-09-19 LAB — MICROALBUMIN / CREATININE URINE RATIO
Creatinine, Urine: 88.8 mg/dL
Microalb Creat Ratio: 7.4 mg/g (ref 0.0–30.0)
Microalb, Ur: 0.66 mg/dL (ref 0.00–1.89)

## 2013-09-19 NOTE — Progress Notes (Signed)
I saw and evaluated the patient.  I personally confirmed the key portions of the history and exam documented by Dr. Chikowski and I reviewed pertinent patient test results.  The assessment, diagnosis, and plan were formulated together and I agree with the documentation in the resident's note. 

## 2013-09-21 ENCOUNTER — Other Ambulatory Visit: Payer: Self-pay | Admitting: Internal Medicine

## 2013-09-21 ENCOUNTER — Telehealth: Payer: Self-pay | Admitting: *Deleted

## 2013-09-21 MED ORDER — PRAVASTATIN SODIUM 40 MG PO TABS: 40.0000 mg | ORAL_TABLET | Freq: Every evening | ORAL | Status: DC

## 2013-09-21 NOTE — Telephone Encounter (Signed)
Received PA request from pt's pharmacy for Crestor 5 mg tabs.  Medication is non-preferred.  Preferred meds are atorvastatin, lovastatin, pravastatin, and simvastatin.  Trial and failure of two preferred agents are required unless otherwise indicated.  Will forward info to pcp for review.  Please change to preferred med if possible.  New med will need to be sent to pt's pharmacy and crestor removed from list.  Please advise.Criss Alvine, Darlene Cassady10/9/20149:09 AM

## 2013-09-21 NOTE — Telephone Encounter (Signed)
I changed this patient's Crestor to pravastatin 40mg  daily. Please let me know if there is anything else this patient needs.   Thanks,  Fleet Contras

## 2013-12-18 ENCOUNTER — Encounter: Payer: Self-pay | Admitting: Internal Medicine

## 2013-12-18 ENCOUNTER — Ambulatory Visit (INDEPENDENT_AMBULATORY_CARE_PROVIDER_SITE_OTHER): Payer: Medicaid Other | Admitting: Internal Medicine

## 2013-12-18 VITALS — BP 113/71 | HR 63 | Temp 97.1°F | Ht 65.5 in | Wt 140.4 lb

## 2013-12-18 DIAGNOSIS — Z0000 Encounter for general adult medical examination without abnormal findings: Secondary | ICD-10-CM

## 2013-12-18 DIAGNOSIS — M545 Low back pain, unspecified: Secondary | ICD-10-CM | POA: Insufficient documentation

## 2013-12-18 DIAGNOSIS — E1149 Type 2 diabetes mellitus with other diabetic neurological complication: Secondary | ICD-10-CM

## 2013-12-18 DIAGNOSIS — K219 Gastro-esophageal reflux disease without esophagitis: Secondary | ICD-10-CM

## 2013-12-18 DIAGNOSIS — E785 Hyperlipidemia, unspecified: Secondary | ICD-10-CM

## 2013-12-18 DIAGNOSIS — I10 Essential (primary) hypertension: Secondary | ICD-10-CM

## 2013-12-18 DIAGNOSIS — F17200 Nicotine dependence, unspecified, uncomplicated: Secondary | ICD-10-CM

## 2013-12-18 HISTORY — DX: Low back pain, unspecified: M54.50

## 2013-12-18 LAB — POCT GLYCOSYLATED HEMOGLOBIN (HGB A1C): Hemoglobin A1C: 6.9

## 2013-12-18 LAB — COMPLETE METABOLIC PANEL WITH GFR
ALT: <8 U/L (ref 0–35)
AST: 11 U/L (ref 0–37)
Albumin: 3.9 g/dL (ref 3.5–5.2)
Alkaline Phosphatase: 46 U/L (ref 39–117)
BILIRUBIN TOTAL: 0.6 mg/dL (ref 0.3–1.2)
BUN: 10 mg/dL (ref 6–23)
CO2: 31 meq/L (ref 19–32)
CREATININE: 0.64 mg/dL (ref 0.50–1.10)
Calcium: 9.2 mg/dL (ref 8.4–10.5)
Chloride: 101 mEq/L (ref 96–112)
Glucose, Bld: 202 mg/dL — ABNORMAL HIGH (ref 70–99)
Potassium: 3.8 mEq/L (ref 3.5–5.3)
Sodium: 139 mEq/L (ref 135–145)
Total Protein: 6.4 g/dL (ref 6.0–8.3)

## 2013-12-18 LAB — GLUCOSE, CAPILLARY: GLUCOSE-CAPILLARY: 247 mg/dL — AB (ref 70–99)

## 2013-12-18 NOTE — Assessment & Plan Note (Addendum)
BP Readings from Last 3 Encounters:  12/18/13 113/71  09/18/13 137/86  06/06/13 108/67    Lab Results  Component Value Date   NA 139 06/06/2013   K 3.4* 06/06/2013   CREATININE 0.73 06/06/2013    Assessment: Blood pressure control:  good Progress toward BP goal:   at goal Comments: none  Plan: Medications:  continue current medications Educational resources provided:   Self management tools provided:   Other plans: Continue lisinopril-HCTZ at current dose. Pt tolerating well. Will check CMP today to assess renal function and electrolytes

## 2013-12-18 NOTE — Progress Notes (Signed)
I saw and evaluated the patient.  I personally confirmed the key portions of the history and exam documented by Dr. Mechele Claude and I reviewed pertinent patient test results.  The assessment, diagnosis, and plan were formulated together and I agree with the documentation in the resident's note.

## 2013-12-18 NOTE — Assessment & Plan Note (Signed)
Angie Cain has chronic backache associated with movement. She is very active and is able to walk and her form while at her job. I suspect this is musculoskeletal in origin I do not believe she has a herniated disc as her straight leg raise was negative. No red flag signs or symptoms. She takes naproxen at home and I encouraged her to continue taking this. I will check renal function today though it has been normal in the past.

## 2013-12-18 NOTE — Assessment & Plan Note (Signed)
Patient has agreed to do a colonoscopy as she did not use the stool cards at home since her last visit. I sent in referral today.

## 2013-12-18 NOTE — Assessment & Plan Note (Signed)
Patient down to 3 cigarettes per day. She does have episodes where she will smoke more than this if she is angry or stressed. I encouraged her to continue her efforts to stop smoking altogether. She denied wanting any medications to assist her in this process.

## 2013-12-18 NOTE — Assessment & Plan Note (Signed)
Patient with some c/o early morning stomach pain after eating late at night. She is not taking her prilosec as prescribed and is only taking it on a prn basis. I talked with patient about taking the Prilosec daily for 2 weeks to see if this helps her abd pain. I also encouraged her to refrain from eating less in 2-3 hours before she lies down to sleep.

## 2013-12-18 NOTE — Progress Notes (Signed)
Patient ID: Angie Cain, female   DOB: Apr 03, 1956, 58 y.o.   MRN: [id number] HPI The patient is a 58 y.o. female with a history of PVD s/p fem-fem bypass in 2011, HTN, T2DM presenting to the clinic for a routine f/u appt.  HTN- BP at goal today. Patient is compliant with her lisinopril-HCTZ and is doing well on this regimen.  DM- Patient takes metformin 850mg  BID and lantus 15U qHS, she is complaint with this regimen for the most part. Does feel like the metformin makes her stomach upset and has diarrhea intermittently and on these days she skips taking one metformin pill. She is ok with the diarrhea as it does not impede daily life. Patient's last A1c 7.5% 09/2013. She checks her CBG twice daily at home, ranging 90-273 but mostly in the mid 100s. She does not c/o having hypoglycemic episodes. She takes gabapentin for neuropathy, this is stable.  GERD- Patient doesn't have epigastric burning unlessshe eats late at night and when she does this she wakes up in the morning with stomach pain. She is supposed to take prilosec, but she is only taking these on an as needed basis.   HLD-Takes pravastatin and is complaint. Last LDL 100 in 05/2013.  Tobacco abuse- Smokes 3cig/day. She is still trying to quit and is down from 1/4ppd at her last visit. She smokes more with she is angry or stressed at which time she smokes more. She does not want any medication to assist her.  Low back ache- Patient c/o dull achey low back pain that worsens with a lot of movement. No radiation down legs, bowel/bladder incontinence, saddle paresthesia, leg weakness. She is still able to stretch and walks everywhere she goes. She takes naproxen, which seems to help.   ROS: General: no fevers, chills, changes in weight, changes in appetite Skin: no rash HEENT: no blurry vision, hearing changes, sore throat Pulm: no dyspnea, coughing, wheezing CV: no chest pain, palpitations, shortness of breath Abd: see HPI GU: no dysuria,  hematuria, polyuria MSK: see HPI Neuro: +peripheral neuropathy  Filed Vitals:   12/18/13 1338  BP: 113/71  Pulse: 63  Temp: 97.1 F (36.2 C)    Physical Exam General: thin, alert, cooperative, and in no apparent distress HEENT: pupils equal round and reactive to light, vision grossly intact, oropharynx clear and non-erythematous, MMM Neck: supple Lungs: clear to ascultation bilaterally, normal work of respiration, no wheezes, rales, ronchi Heart: regular rate and rhythm, no murmurs, gallops, or rubs Abdomen: thin, soft, non-tender, non-distended, normal bowel sounds Extremities: warm, no ble edema Neurologic: alert & oriented X3, cranial nerves II-XII grossly intact, strength grossly intact, sensation intact to light touch Back: mild TTP over lumbar paraspinal muscles, neg SLR, no point tenderness over spinous processes   Current Outpatient Prescriptions on File Prior to Visit  Medication Sig Dispense Refill  . calcium citrate-vitamin D (CITRACAL+D) 315-200 MG-UNIT per tablet Take 2 tablets by mouth 2 (two) times daily.  100 tablet  3  . gabapentin (NEURONTIN) 300 MG capsule Take 300 mg by mouth 3 (three) times daily.      . hydrOXYzine (ATARAX/VISTARIL) 10 MG tablet Take 1 tablet (10 mg total) by mouth 3 (three) times daily as needed for itching.  30 tablet  2  . insulin glargine (LANTUS) 100 UNIT/ML injection Inject 0.15 mLs (15 Units total) into the skin at bedtime.  10 mL  2  . lisinopril-hydrochlorothiazide (PRINZIDE,ZESTORETIC) 20-25 MG per tablet Take 1 tablet by mouth daily.  [place]  tablet  11  . metFORMIN (GLUCOPHAGE) 850 MG tablet Take 1 tablet (850 mg total) by mouth 2 (two) times daily with a meal.  60 tablet  6  . omeprazole (PRILOSEC) 20 MG capsule Take 1 capsule (20 mg total) by mouth as needed.  30 capsule  1  . pravastatin (PRAVACHOL) 40 MG tablet Take 1 tablet (40 mg total) by mouth every evening.  30 tablet  11  . vitamin C (ASCORBIC ACID) 250 MG tablet Take 250 mg by  mouth daily.       No current facility-administered medications on file prior to visit.    Assessment/Plan

## 2013-12-18 NOTE — Patient Instructions (Signed)
Thank you for your visit.  Please continue taking your medications as previously prescribed.  You can continue taking naproxen for your back ache.  We checked your A1c today and it was 6.9, which is very good! Good work!!  Please continue with your effort to quit smoking, you are doing a good job so far.  Try a 2 week course where you take your prilosec every day to see if this helps with your morning abdominal pain. Try not to eat less than 2-3 hours before bed.   You should be called about your colonoscopy appointment, so please have this done.   Follow up in 3 months.

## 2013-12-18 NOTE — Assessment & Plan Note (Signed)
Lab Results  Component Value Date   HGBA1C 6.9 12/18/2013   HGBA1C 7.5 09/18/2013   HGBA1C 7.2 06/06/2013     Assessment: Diabetes control:  good Progress toward A1C goal:   at goal Comments:   Plan: Medications:  continue current medications Home glucose monitoring: Frequency:   Timing:   Instruction/counseling given: reminded to bring blood glucose meter & log to each visit, reminded to bring medications to each visit, discussed foot care and discussed diet Educational resources provided: brochure Self management tools provided: copy of home glucose meter download Other plans: continue metformin 850mg  bid and lantus 15u qhs. Patient does complain of having some loose stools with the new increased dose of metformin since her last visit. However, she does not mind this as sometimes she can get constipated. She is able to tolerate the metformin and does not feel as though it needs to be changed today.

## 2014-01-04 ENCOUNTER — Encounter: Payer: Self-pay | Admitting: Internal Medicine

## 2014-02-21 ENCOUNTER — Ambulatory Visit (AMBULATORY_SURGERY_CENTER): Payer: Medicaid Other | Admitting: *Deleted

## 2014-02-21 VITALS — Ht 66.0 in | Wt 136.2 lb

## 2014-02-21 DIAGNOSIS — Z1211 Encounter for screening for malignant neoplasm of colon: Secondary | ICD-10-CM

## 2014-02-21 MED ORDER — NA SULFATE-K SULFATE-MG SULF 17.5-3.13-1.6 GM/177ML PO SOLN: ORAL | Status: DC

## 2014-02-21 NOTE — Progress Notes (Signed)
No egg or soy allergy 

## 2014-03-02 ENCOUNTER — Encounter: Payer: Self-pay | Admitting: Internal Medicine

## 2014-03-02 ENCOUNTER — Ambulatory Visit (AMBULATORY_SURGERY_CENTER): Payer: Medicaid Other | Admitting: Internal Medicine

## 2014-03-02 VITALS — BP 158/88 | HR 61 | Temp 97.0°F | Resp 14 | Ht 66.0 in | Wt 136.0 lb

## 2014-03-02 DIAGNOSIS — Z1211 Encounter for screening for malignant neoplasm of colon: Secondary | ICD-10-CM

## 2014-03-02 DIAGNOSIS — K5730 Diverticulosis of large intestine without perforation or abscess without bleeding: Secondary | ICD-10-CM

## 2014-03-02 DIAGNOSIS — D126 Benign neoplasm of colon, unspecified: Secondary | ICD-10-CM

## 2014-03-02 LAB — GLUCOSE, CAPILLARY
GLUCOSE-CAPILLARY: 145 mg/dL — AB (ref 70–99)
Glucose-Capillary: 152 mg/dL — ABNORMAL HIGH (ref 70–99)

## 2014-03-02 MED ORDER — SODIUM CHLORIDE 0.9 % IV SOLN: 500.0000 mL | INTRAVENOUS | Status: DC

## 2014-03-02 NOTE — Progress Notes (Signed)
Pt stable to RR 

## 2014-03-02 NOTE — Op Note (Signed)
[place]  [address]. [place], [address]   COLONOSCOPY PROCEDURE REPORT  PATIENT: Nerea, Bordenave  MR#: [id number] BIRTHDATE: [birth year] , 89  yrs. old GENDER: Female ENDOSCOPIST: Gatha Mayer, MD, [facility] PROCEDURE DATE:  03/02/2014 PROCEDURE:   Colonoscopy with snare polypectomy First Screening Colonoscopy - Avg.  risk and is 50 yrs.  old or older Yes.  Prior Negative Screening - Now for repeat screening. N/A  History of Adenoma - Now for follow-up colonoscopy & has been > or = to 3 yrs.  N/A  Polyps Removed Today? Yes. ASA CLASS:   Class II INDICATIONS:average risk screening and first colonoscopy. MEDICATIONS: propofol (Diprivan) 400mg  IV, MAC sedation, administered by CRNA, and These medications were titrated to patient response per physician's verbal order  DESCRIPTION OF PROCEDURE:   After the risks benefits and alternatives of the procedure were thoroughly explained, informed consent was obtained.  A digital rectal exam revealed no abnormalities of the rectum.   The [id number]  endoscope was introduced through the anus and advanced to the cecum, which was identified by both the appendix and ileocecal valve. No adverse events experienced.   The quality of the prep was Suprep good  The instrument was then slowly withdrawn as the colon was fully examined.      COLON FINDINGS: Five diminutive sessile polyps were found in the ascending colon, at the splenic flexure, and in the descending colon.  A polypectomy was performed with a cold snare.  The resection was complete and the polyp tissue was completely retrieved.   Severe diverticulosis was noted The finding was in the left colon.   Mild diverticulosis was noted The finding was in the right colon.   The colon mucosa was otherwise normal.   A right colon retroflexion was performed.  Retroflexed views revealed no abnormalities. The time to cecum=2 minutes 45 seconds.  Withdrawal time=24  minutes 41 seconds.  The scope was withdrawn and the procedure completed. COMPLICATIONS: There were no complications.  ENDOSCOPIC IMPRESSION: 1.   Five diminutive sessile polyps were found in the ascending colon, at the splenic flexure, and in the descending colon; polypectomy was performed with a cold snare 2.   Severe diverticulosis was noted in the left colon 3.   Mild diverticulosis was noted in the right colon 4.   The colon mucosa was otherwise normal - good prep first colonoscopy  RECOMMENDATIONS: Timing of repeat colonoscopy will be determined by pathology findings.   eSigned:  Gatha Mayer, MD, [facility] 03/02/2014 9:28 AM   cc: The Patient  and Rebecca Eaton, MD   PATIENT NAME:  Avenly, Roberge MR#: [id number]

## 2014-03-02 NOTE — Progress Notes (Signed)
Called to room to assist during endoscopic procedure.  Patient ID and intended procedure confirmed with present staff. Received instructions for my participation in the procedure from the performing physician.  

## 2014-03-02 NOTE — Patient Instructions (Addendum)
I found and removed 5 tiny polyps that look benign. You also have diverticulosis - a common condition that is not usually a problem.  I will let you know pathology results and when to have another routine colonoscopy by mail.  I appreciate the opportunity to care for you.   Discharge instructions given with verbal understanding. Handouts on polyps and diverticulosis. Resume previous medications. YOU HAD AN ENDOSCOPIC PROCEDURE TODAY AT [facility]: Refer to the procedure report that was given to you for any specific questions about what was found during the examination.  If the procedure report does not answer your questions, please call your gastroenterologist to clarify.  If you requested that your care partner not be given the details of your procedure findings, then the procedure report has been included in a sealed envelope for you to review at your convenience later.  YOU SHOULD EXPECT: Some feelings of bloating in the abdomen. Passage of more gas than usual.  Walking can help get rid of the air that was put into your GI tract during the procedure and reduce the bloating. If you had a lower endoscopy (such as a colonoscopy or flexible sigmoidoscopy) you may notice spotting of blood in your stool or on the toilet paper. If you underwent a bowel prep for your procedure, then you may not have a normal bowel movement for a few days.  DIET: Your first meal following the procedure should be a light meal and then it is ok to progress to your normal diet.  A half-sandwich or bowl of soup is an example of a good first meal.  Heavy or fried foods are harder to digest and may make you feel nauseous or bloated.  Likewise meals heavy in dairy and vegetables can cause extra gas to form and this can also increase the bloating.  Drink plenty of fluids but you should avoid alcoholic beverages for 24 hours.  ACTIVITY: Your care partner should take you home directly after the procedure.  You  should plan to take it easy, moving slowly for the rest of the day.  You can resume normal activity the day after the procedure however you should NOT DRIVE or use heavy machinery for 24 hours (because of the sedation medicines used during the test).    SYMPTOMS TO REPORT IMMEDIATELY: A gastroenterologist can be reached at any hour.  During normal business hours, 8:30 AM to 5:00 PM Monday through Friday, call [phone].  After hours and on weekends, please call the GI answering service at [phone] who will take a message and have the physician on call contact you.   Following lower endoscopy (colonoscopy or flexible sigmoidoscopy):  Excessive amounts of blood in the stool  Significant tenderness or worsening of abdominal pains  Swelling of the abdomen that is new, acute  Fever of 100F or higher  FOLLOW UP: If any biopsies were taken you will be contacted by phone or by letter within the next 1-3 weeks.  Call your gastroenterologist if you have not heard about the biopsies in 3 weeks.  Our staff will call the home number listed on your records the next business day following your procedure to check on you and address any questions or concerns that you may have at that time regarding the information given to you following your procedure. This is a courtesy call and so if there is no answer at the home number and we have not heard from you through the emergency physician  on call, we will assume that you have returned to your regular daily activities without incident.  SIGNATURES/CONFIDENTIALITY: You and/or your care partner have signed paperwork which will be entered into your electronic medical record.  These signatures attest to the fact that that the information above on your After Visit Summary has been reviewed and is understood.  Full responsibility of the confidentiality of this discharge information lies with you and/or your care-partner.

## 2014-03-05 ENCOUNTER — Telehealth: Payer: Self-pay | Admitting: *Deleted

## 2014-03-05 NOTE — Telephone Encounter (Signed)
  Follow up Call-  Call back number 03/02/2014  Post procedure Call Back phone  # [phone]  Permission to leave phone message Yes     Patient questions:  Do you have a fever, pain , or abdominal swelling? no Pain Score  0 *  Have you tolerated food without any problems? yes  Have you been able to return to your normal activities? yes  Do you have any questions about your discharge instructions: Diet   no Medications  no Follow up visit  no  Do you have questions or concerns about your Care? no  Actions: * If pain score is 4 or above: No action needed, pain <4.

## 2014-03-07 ENCOUNTER — Encounter: Payer: Self-pay | Admitting: Internal Medicine

## 2014-03-07 DIAGNOSIS — Z860101 Personal history of adenomatous and serrated colon polyps: Secondary | ICD-10-CM | POA: Insufficient documentation

## 2014-03-07 DIAGNOSIS — Z860100 Personal history of colon polyps, unspecified: Secondary | ICD-10-CM

## 2014-03-07 HISTORY — DX: Personal history of colonic polyps: Z86.010

## 2014-03-07 HISTORY — DX: Personal history of colon polyps, unspecified: Z86.0100

## 2014-03-07 NOTE — Progress Notes (Signed)
Quick Note:  3 diminutive adenomas - repeat colonoscopy 2018 ______

## 2014-03-28 ENCOUNTER — Other Ambulatory Visit: Payer: Self-pay | Admitting: *Deleted

## 2014-03-28 DIAGNOSIS — E119 Type 2 diabetes mellitus without complications: Secondary | ICD-10-CM

## 2014-03-28 MED ORDER — INSULIN GLARGINE 100 UNIT/ML ~~LOC~~ SOLN: 15.0000 [IU] | Freq: Every day | SUBCUTANEOUS | Status: DC

## 2014-04-30 ENCOUNTER — Other Ambulatory Visit: Payer: Self-pay | Admitting: *Deleted

## 2014-05-01 MED ORDER — GLUCOSE BLOOD VI STRP: ORAL_STRIP | Status: DC

## 2014-05-02 ENCOUNTER — Other Ambulatory Visit: Payer: Self-pay | Admitting: Internal Medicine

## 2014-05-02 ENCOUNTER — Other Ambulatory Visit: Payer: Self-pay | Admitting: *Deleted

## 2014-05-03 MED ORDER — GLUCOSE BLOOD VI STRP: 1.0000 | ORAL_STRIP | Freq: Four times a day (QID) | Status: DC

## 2014-06-04 ENCOUNTER — Ambulatory Visit (INDEPENDENT_AMBULATORY_CARE_PROVIDER_SITE_OTHER): Payer: Medicaid Other | Admitting: Internal Medicine

## 2014-06-04 ENCOUNTER — Encounter: Payer: Self-pay | Admitting: Internal Medicine

## 2014-06-04 VITALS — BP 137/75 | HR 66 | Temp 98.0°F | Ht 65.0 in | Wt 137.6 lb

## 2014-06-04 DIAGNOSIS — E119 Type 2 diabetes mellitus without complications: Secondary | ICD-10-CM

## 2014-06-04 DIAGNOSIS — F17200 Nicotine dependence, unspecified, uncomplicated: Secondary | ICD-10-CM

## 2014-06-04 DIAGNOSIS — M545 Low back pain, unspecified: Secondary | ICD-10-CM

## 2014-06-04 DIAGNOSIS — Z860100 Personal history of colon polyps, unspecified: Secondary | ICD-10-CM

## 2014-06-04 DIAGNOSIS — K219 Gastro-esophageal reflux disease without esophagitis: Secondary | ICD-10-CM

## 2014-06-04 DIAGNOSIS — E1149 Type 2 diabetes mellitus with other diabetic neurological complication: Secondary | ICD-10-CM

## 2014-06-04 DIAGNOSIS — E785 Hyperlipidemia, unspecified: Secondary | ICD-10-CM

## 2014-06-04 DIAGNOSIS — L299 Pruritus, unspecified: Secondary | ICD-10-CM

## 2014-06-04 DIAGNOSIS — I10 Essential (primary) hypertension: Secondary | ICD-10-CM

## 2014-06-04 DIAGNOSIS — Z0000 Encounter for general adult medical examination without abnormal findings: Secondary | ICD-10-CM

## 2014-06-04 DIAGNOSIS — Z1239 Encounter for other screening for malignant neoplasm of breast: Secondary | ICD-10-CM

## 2014-06-04 LAB — LIPID PANEL
CHOLESTEROL: 145 mg/dL (ref 0–200)
HDL: 50 mg/dL (ref >39)
LDL Cholesterol: 83 mg/dL (ref 0–99)
TRIGLYCERIDES: 60 mg/dL (ref <150)
Total CHOL/HDL Ratio: 2.9 Ratio
VLDL: 12 mg/dL (ref 0–40)

## 2014-06-04 LAB — GLUCOSE, CAPILLARY: GLUCOSE-CAPILLARY: 137 mg/dL — AB (ref 70–99)

## 2014-06-04 LAB — POCT GLYCOSYLATED HEMOGLOBIN (HGB A1C): HEMOGLOBIN A1C: 7.7

## 2014-06-04 MED ORDER — LISINOPRIL-HYDROCHLOROTHIAZIDE 20-25 MG PO TABS: 1.0000 | ORAL_TABLET | Freq: Every day | ORAL | Status: DC

## 2014-06-04 MED ORDER — PRAVASTATIN SODIUM 40 MG PO TABS: 40.0000 mg | ORAL_TABLET | Freq: Every evening | ORAL | Status: DC

## 2014-06-04 MED ORDER — NICOTINE 14 MG/24HR TD PT24: 14.0000 mg | MEDICATED_PATCH | Freq: Every day | TRANSDERMAL | Status: DC

## 2014-06-04 MED ORDER — METFORMIN HCL 850 MG PO TABS: 850.0000 mg | ORAL_TABLET | Freq: Two times a day (BID) | ORAL | Status: DC

## 2014-06-04 MED ORDER — CALCIUM CITRATE-VITAMIN D 315-200 MG-UNIT PO TABS: 2.0000 | ORAL_TABLET | Freq: Two times a day (BID) | ORAL | Status: DC

## 2014-06-04 MED ORDER — NAPROXEN 250 MG PO TABS
250.0000 mg | ORAL_TABLET | Freq: Two times a day (BID) | ORAL | Status: DC | PRN
Start: 2014-06-04 — End: 2014-08-24

## 2014-06-04 MED ORDER — GABAPENTIN 300 MG PO CAPS: 300.0000 mg | ORAL_CAPSULE | Freq: Three times a day (TID) | ORAL | Status: DC

## 2014-06-04 MED ORDER — OMEPRAZOLE 20 MG PO CPDR: 20.0000 mg | DELAYED_RELEASE_CAPSULE | ORAL | Status: DC | PRN

## 2014-06-04 MED ORDER — GLUCOSE BLOOD VI STRP: 1.0000 | ORAL_STRIP | Freq: Four times a day (QID) | Status: DC

## 2014-06-04 NOTE — Progress Notes (Signed)
Patient ID: Angie Cain, female   DOB: [birth year], 58 y.o.   MRN: [id number] HPI The patient is a 58 y.o. female with a history of PVD s/p fem-fem bypass (2011), HTN, type 2 DM, GERD, HLD who presents for a routine clinic visit.  HTN: Pt takes lisinopril-HCTZ 20-25mg  daily. BP remains well controlled. Pt tolerating this medication well.   GERD: Patient had been taking prilosec 20mg  daily, though she ran out about 1 month ago. Prior to our last visit in Jan 2015, she had been taking this on an as needed basis, though at our last visit we discussed taking this medication on a daily basis. She reports this change in how she takes the medication controlled her symptoms well. She is requesting a refill today.  DM type 2, well controlled: Patient takes lantus 15U qHS, metformin 850mg  BID, pt is compliant. She has some diarrhea, though this is still quite tolerable as it helps with her intermittent constipation. Pt does skip some doses of metformin occasionally if she is having diarrhea. However, she says she does this only very infrequently. Last A1c 6.9% in January 2015. She checks CBGs approx 2 times per day. Per meter review today, CBGs range in the mid-100s (highest was one read of 290 and lowest 64 for 1 episode). Pt did become symptomatic with the episode of CBG 64, though quickly resolved with a snack. No other episodes of hypoglycemia. She has gabapentin for her peripheral neuropathy, though she only takes 1 pill on an as needed basis. I told her she can take this more often (up to three times per day) if needed. She reports symptoms of neuropathic pain are well controlled.   Tobacco abuse: Pt smoking approx 4-5 cigarettes per day. She is interested in quitting. She had Chantix prescribed to her a while ago, though she never took it (for unknown reason). She would like to try NRT today.  ROS: General: no fevers, chills, changes in appetite, though pt frequently forgets to eat Skin: no rash HEENT:  intermittent blurry vision, no double vision, no hearing changes, no sore throat Pulm: no dyspnea, coughing CV: no chest pain, palpitations, shortness of breath Abd: pt has intermittent diarrhea w/ metformin (once per week on average) no abdominal pain, nausea/vomiting GU: no dysuria Ext: intermittent low back pain (chronic) Neuro: pt with numbness/tingling to lower legs bilaterally (well controlled); no weakness  Filed Vitals:   06/04/14 1512  BP: 137/75  Pulse: 66  Temp: 98 F (36.7 C)    Physical Exam General: very pleasant woman, alert, NAD HEENT: pupils equal round and reactive to light, vision grossly intact, oropharynx clear and non-erythematous  Neck: supple Lungs: CTAB Heart: RRR Abdomen: soft, non-tender, non-distended, normal bowel sounds Extremities: 2+ DP/PT pulses bilaterally, no pedal edema Neurologic: alert & oriented X3, cranial nerves II-XII grossly  intact, strength grossly intact, sensation intact to light touch; gait normal   Current Outpatient Prescriptions on File Prior to Visit  Medication Sig Dispense Refill  . calcium citrate-vitamin D (CITRACAL+D) 315-200 MG-UNIT per tablet Take 2 tablets by mouth 2 (two) times daily.  100 tablet  3  . gabapentin (NEURONTIN) 300 MG capsule Take 300 mg by mouth 3 (three) times daily.      Marland Kitchen glucose blood (ACCU-CHEK AVIVA) test strip 1 each by Other route 4 (four) times daily. Use 3 to 4 times daily to check blood sugar, diag code 250.60. Insulin dependent  100 each  6  . hydrOXYzine (ATARAX/VISTARIL) 10 MG tablet Take  1 tablet (10 mg total) by mouth 3 (three) times daily as needed for itching.  30 tablet  2  . insulin glargine (LANTUS) 100 UNIT/ML injection Inject 0.15 mLs (15 Units total) into the skin at bedtime.  30 mL  4  . lisinopril-hydrochlorothiazide (PRINZIDE,ZESTORETIC) 20-25 MG per tablet Take 1 tablet by mouth daily.  30 tablet  11  . metFORMIN (GLUCOPHAGE) 850 MG tablet Take 1 tablet (850 mg total) by mouth 2  (two) times daily with a meal.  60 tablet  6  . NAPROXEN PO Take by mouth as needed.      Marland Kitchen omeprazole (PRILOSEC) 20 MG capsule Take 1 capsule (20 mg total) by mouth as needed.  30 capsule  1  . pravastatin (PRAVACHOL) 40 MG tablet Take 1 tablet (40 mg total) by mouth every evening.  30 tablet  11   No current facility-administered medications on file prior to visit.    Assessment/Plan  Please seem problem list for A/P.

## 2014-06-04 NOTE — Assessment & Plan Note (Signed)
Lab Results  Component Value Date   HGBA1C 7.7 06/04/2014   HGBA1C 6.9 12/18/2013   HGBA1C 7.5 09/18/2013     Assessment: Diabetes control:  fair to good Progress toward A1C goal:   deteriorated  Comments: Patient skips metformin every once in a while given some diarrhea. Generally speaking she claims to take it mostly twice per day and states she does not skip metformin frequently. Her CBGs mostly range in the mid-100s. She did have one low of 64 w/ symptoms and another low of 69 w/o symptoms. Given these lows I do not want to go up on her lantus.   Plan: Medications:  continue current medications Home glucose monitoring: Frequency:   Timing:   Instruction/counseling given: reminded to get eye exam, reminded to bring blood glucose meter & log to each visit and discussed foot care Educational resources provided: brochure Self management tools provided: copy of home glucose meter download Other plans: Given patient's infrequent low CBG readings at home, I do not want to alter her lantus. A1c has slightly deteriorated, so if this continues we may need to alter her diabetic regimen. For now, I just encouraged patient to not skip her metformin as this may be contributing to her A1c deterioration. I refilled patient's test strips today also. She will continue taking her CBGs at least twice per day.  -check lipid panel today -foot exam today -reminded to have eye exam (Pt sees Dr. Katy Fitch and agrees to call his office to schedule her annual eye exam)

## 2014-06-04 NOTE — Assessment & Plan Note (Signed)
Pt still smoking approx 4-5 cigarettes per day. She is interested in quitting. Per pt's preference, I rx'd nicotine patch (14mg Margo Aye). I asked her to take off the patch if she is going to smoke. Please reevaluate at next office visit.

## 2014-06-04 NOTE — Assessment & Plan Note (Signed)
BP Readings from Last 3 Encounters:  06/04/14 137/75  03/02/14 158/88  12/18/13 113/71    Lab Results  Component Value Date   NA 139 12/18/2013   K 3.8 12/18/2013   CREATININE 0.64 12/18/2013    Assessment: Blood pressure control:  good Progress toward BP goal:   at goal Comments:   Plan: Medications:  continue current medications Educational resources provided: brochure Self management tools provided:   Other plans: pt doing well on lisinopril-HCTZ 20-25mg  daily. Continue this medication regimen as BP well controlled.

## 2014-06-04 NOTE — Patient Instructions (Signed)
Thank you for your visit today.  Please continue taking all your medications as you have been. Make sure you are taking your prilosec (acid reflux medication) daily to help your symptoms.  Please DO NOT smoke with the nicotine patch. Take it off before having a cigarette.   Today we checked a lipid panel. We will call with any abnormal results.  Your A1c today is 7.7%. Please continue to check your blood glucose at least twice per day.

## 2014-06-04 NOTE — Assessment & Plan Note (Signed)
Checking Lipid panel today. Continue pravastatin 40mg  daily.

## 2014-06-04 NOTE — Assessment & Plan Note (Signed)
Pt ran out of prilosec over the last few weeks. Symptoms controlled on prilosec 20mg  daily. I refilled this medication.

## 2014-06-05 NOTE — Progress Notes (Signed)
INTERNAL MEDICINE TEACHING ATTENDING ADDENDUM - Aldine Contes, MD: I reviewed and discussed with the resident Dr. Mechele Claude, the patient's medical history, physical examination, diagnosis and results of pertinent tests and treatment and I agree with the patient's care as documented.

## 2014-07-10 ENCOUNTER — Telehealth: Payer: Self-pay | Admitting: Internal Medicine

## 2014-07-10 NOTE — Telephone Encounter (Signed)
I spoke to the patient today to introduce myself and to schedule a follow-up appointment. She said she has not been feeling well and will schedule herself for mid-August.

## 2014-07-27 ENCOUNTER — Encounter: Payer: Self-pay | Admitting: Internal Medicine

## 2014-07-27 ENCOUNTER — Ambulatory Visit (INDEPENDENT_AMBULATORY_CARE_PROVIDER_SITE_OTHER): Payer: Medicaid Other | Admitting: Internal Medicine

## 2014-07-27 VITALS — BP 140/85 | HR 61 | Temp 97.7°F | Ht 65.0 in | Wt 140.0 lb

## 2014-07-27 DIAGNOSIS — I10 Essential (primary) hypertension: Secondary | ICD-10-CM

## 2014-07-27 DIAGNOSIS — F17200 Nicotine dependence, unspecified, uncomplicated: Secondary | ICD-10-CM

## 2014-07-27 DIAGNOSIS — E1149 Type 2 diabetes mellitus with other diabetic neurological complication: Secondary | ICD-10-CM

## 2014-07-27 LAB — GLUCOSE, CAPILLARY: GLUCOSE-CAPILLARY: 79 mg/dL (ref 70–99)

## 2014-07-27 NOTE — Patient Instructions (Addendum)
Please take 10 units of Lantus at bedtime and continue checking your sugars.  I will arrange you to meet with Ms. Butch Penny next month.   General Instructions:   Please try to bring all your medicines next time. This will help [place] keep you safe from mistakes.   Progress Toward Treatment Goals:  Treatment Goal 09/18/2013  Hemoglobin A1C unchanged  Blood pressure deteriorated  Stop smoking smoking less    Self Care Goals & Plans:  Self Care Goal 06/04/2014  Manage my medications take my medicines as prescribed; bring my medications to every visit; refill my medications on time  Monitor my health -  Eat healthy foods drink diet soda or water instead of juice or soda; eat more vegetables; eat foods that are low in salt; eat baked foods instead of fried foods; eat fruit for snacks and desserts  Be physically active -  Stop smoking -    Home Blood Glucose Monitoring 09/18/2013  Check my blood sugar 3 times a day  When to check my blood sugar -     Care Management & Community Referrals:  Referral 09/18/2013  Referrals made for care management support diabetes educator     Diabetes Mellitus and Food It is important for you to manage your blood sugar (glucose) level. Your blood glucose level can be greatly affected by what you eat. Eating healthier foods in the appropriate amounts throughout the day at about the same time each day will help you control your blood glucose level. It can also help slow or prevent worsening of your diabetes mellitus. Healthy eating may even help you improve the level of your blood pressure and reach or maintain a healthy weight.  HOW CAN FOOD AFFECT ME? Carbohydrates Carbohydrates affect your blood glucose level more than any other type of food. Your dietitian will help you determine how many carbohydrates to eat at each meal and teach you how to count carbohydrates. Counting carbohydrates is important to keep your blood glucose at a healthy level, especially  if you are using insulin or taking certain medicines for diabetes mellitus. Alcohol Alcohol can cause sudden decreases in blood glucose (hypoglycemia), especially if you use insulin or take certain medicines for diabetes mellitus. Hypoglycemia can be a life-threatening condition. Symptoms of hypoglycemia (sleepiness, dizziness, and disorientation) are similar to symptoms of having too much alcohol.  If your health care provider has given you approval to drink alcohol, do so in moderation and use the following guidelines:  Women should not have more than one drink per day, and men should not have more than two drinks per day. One drink is equal to:  12 oz of beer.  5 oz of wine.  1 oz of hard liquor.  Do not drink on an empty stomach.  Keep yourself hydrated. Have water, diet soda, or unsweetened iced tea.  Regular soda, juice, and other mixers might contain a lot of carbohydrates and should be counted. WHAT FOODS ARE NOT RECOMMENDED? As you make food choices, it is important to remember that all foods are not the same. Some foods have fewer nutrients per serving than other foods, even though they might have the same number of calories or carbohydrates. It is difficult to get your body what it needs when you eat foods with fewer nutrients. Examples of foods that you should avoid that are high in calories and carbohydrates but low in nutrients include:  Trans fats (most processed foods list trans fats on the Nutrition Facts label).  Regular soda.  Juice.  Candy.  Sweets, such as cake, pie, doughnuts, and cookies.  Fried foods. WHAT FOODS CAN I EAT? Have nutrient-rich foods, which will nourish your body and keep you healthy. The food you should eat also will depend on several factors, including:  The calories you need.  The medicines you take.  Your weight.  Your blood glucose level.  Your blood pressure level.  Your cholesterol level. You also should eat a variety of  foods, including:  Protein, such as meat, poultry, fish, tofu, nuts, and seeds (lean animal proteins are best).  Fruits.  Vegetables.  Dairy products, such as milk, cheese, and yogurt (low fat is best).  Breads, grains, pasta, cereal, rice, and beans.  Fats such as olive oil, trans fat-free margarine, canola oil, avocado, and olives. DOES EVERYONE WITH DIABETES MELLITUS HAVE THE SAME MEAL PLAN? Because every person with diabetes mellitus is different, there is not one meal plan that works for everyone. It is very important that you meet with a dietitian who will help you create a meal plan that is just right for you. Document Released: 08/27/2005 Document Revised: 12/05/2013 Document Reviewed: 10/27/2013 [facility] Patient Information 2015 [place], [place]. This information is not intended to replace advice given to you by your health care provider. Make sure you discuss any questions you have with your health care provider.

## 2014-07-27 NOTE — Progress Notes (Signed)
Patient ID: Angie Cain, female   DOB: 1956-02-27, 58 y.o.   MRN: [id number]  I saw and evaluated the patient.  I personally confirmed the key portions of the history and exam documented by Dr. Posey Pronto and I reviewed pertinent patient test results.  The assessment, diagnosis, and plan were formulated together and I agree with the documentation in the resident's note.

## 2014-07-27 NOTE — Progress Notes (Signed)
   Subjective:    Patient ID: Angie Cain, female    DOB: 28-May-1956, 58 y.o.   MRN: [id number]  HPI Angie Cain is a 58 year old female with DM2, HTN, GERD, PVD who presents today for a routine office visit.  Please see assessment & plan for documentation of her problems.  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 She is at 140 lbs.   bacco abuse:  Review of Systems     Objective:   Physical Exam        Assessment & Plan:

## 2014-07-28 NOTE — Assessment & Plan Note (Signed)
BP Readings from Last 3 Encounters:  07/27/14 140/85  06/04/14 137/75  03/02/14 158/88    Lab Results  Component Value Date   NA 139 12/18/2013   K 3.8 12/18/2013   CREATININE 0.64 12/18/2013    Assessment: Blood pressure control: controlled Progress toward BP goal:  at goal Comments: Controlled  Plan: Medications:  Lisinopril-HCTZ 20mg -25mg  Educational resources provided:   Self management tools provided:   Other plans:  -Continue current therapy

## 2014-07-28 NOTE — Assessment & Plan Note (Addendum)
Lab Results  Component Value Date   HGBA1C 7.7 06/04/2014   HGBA1C 6.9 12/18/2013   HGBA1C 7.5 09/18/2013     Assessment: Diabetes control: fair control Progress toward A1C goal:  deteriorated Comments: A1c is 7.7. Most CBGs were 100-150 with a few values 160-190 and has had two low values (<100) but unsure of why. She would like a referral to an eye doctor today. She takes Lantus 15 at night and metformin 850mg  BID; she does not miss doses unless she skips meals. She works the night shift and thus stays up. Upon coming home sometimes, she will drink win and smoke marijuana thus foregoing a meal. She has to cook two meals because her husband and her don't share the same tastes. Recently, she has been in a lot of stress and does not eat well.   Plan: Medications:  metformin 850mg  BID; Lantus 15 units Home glucose monitoring: Frequency: once a day Timing: before breakfast Instruction/counseling given: reminded to bring blood glucose meter & log to each visit Educational resources provided:   Self management tools provided:   Other plans:  -Reduce Lantus 15->10 given her occasional low readings -Encourage her to follow-up with Ms. Butch Penny for further nutrition guidance -Consider switching to metformin XR at next visit given her intolerance for the side effects

## 2014-07-28 NOTE — Assessment & Plan Note (Signed)
  Assessment: Progress toward smoking cessation:  smoking less Barriers to progress toward smoking cessation:    Comments: She now smokes 3 cigarettes a day. Though her husband doesn't smoke, she does note being around smokers a lot. She really wants to quit. At her last office visit, she was prescribed patches though never picked them up.    Plan: Instruction/counseling given:  I counseled patient on the dangers of tobacco use, advised patient to stop smoking, and reviewed strategies to maximize success. Educational resources provided:    Self management tools provided:    Medications to assist with smoking cessation:  None Patient agreed to the following self-care plans for smoking cessation:    Other plans:  -Continue encouraging her to quit as she is not interested in the patch and feels she is close to quitting altogether

## 2014-08-24 ENCOUNTER — Ambulatory Visit (INDEPENDENT_AMBULATORY_CARE_PROVIDER_SITE_OTHER): Payer: Medicaid Other | Admitting: Dietician

## 2014-08-24 ENCOUNTER — Ambulatory Visit (INDEPENDENT_AMBULATORY_CARE_PROVIDER_SITE_OTHER): Payer: Medicaid Other | Admitting: Internal Medicine

## 2014-08-24 VITALS — BP 138/65 | HR 61 | Temp 97.8°F | Ht 65.0 in | Wt 139.8 lb

## 2014-08-24 DIAGNOSIS — M545 Low back pain, unspecified: Secondary | ICD-10-CM

## 2014-08-24 DIAGNOSIS — E119 Type 2 diabetes mellitus without complications: Secondary | ICD-10-CM

## 2014-08-24 DIAGNOSIS — I10 Essential (primary) hypertension: Secondary | ICD-10-CM

## 2014-08-24 DIAGNOSIS — E1149 Type 2 diabetes mellitus with other diabetic neurological complication: Secondary | ICD-10-CM

## 2014-08-24 DIAGNOSIS — K219 Gastro-esophageal reflux disease without esophagitis: Secondary | ICD-10-CM

## 2014-08-24 DIAGNOSIS — E785 Hyperlipidemia, unspecified: Secondary | ICD-10-CM

## 2014-08-24 LAB — BASIC METABOLIC PANEL
BUN: 12 mg/dL (ref 6–23)
CHLORIDE: 104 meq/L (ref 96–112)
CO2: 27 meq/L (ref 19–32)
Calcium: 9 mg/dL (ref 8.4–10.5)
Creat: 0.69 mg/dL (ref 0.50–1.10)
Glucose, Bld: 221 mg/dL — ABNORMAL HIGH (ref 70–99)
Potassium: 4.3 mEq/L (ref 3.5–5.3)
SODIUM: 138 meq/L (ref 135–145)

## 2014-08-24 MED ORDER — OMEPRAZOLE 20 MG PO CPDR: 20.0000 mg | DELAYED_RELEASE_CAPSULE | ORAL | Status: DC | PRN

## 2014-08-24 MED ORDER — INSULIN GLARGINE 100 UNIT/ML ~~LOC~~ SOLN: 12.0000 [IU] | Freq: Every day | SUBCUTANEOUS | Status: DC

## 2014-08-24 MED ORDER — NAPROXEN 250 MG PO TABS: 250.0000 mg | ORAL_TABLET | Freq: Two times a day (BID) | ORAL | Status: DC | PRN

## 2014-08-24 MED ORDER — GABAPENTIN 300 MG PO CAPS: 300.0000 mg | ORAL_CAPSULE | Freq: Three times a day (TID) | ORAL | Status: DC

## 2014-08-24 MED ORDER — LISINOPRIL-HYDROCHLOROTHIAZIDE 20-25 MG PO TABS: 1.0000 | ORAL_TABLET | Freq: Every day | ORAL | Status: DC

## 2014-08-24 MED ORDER — PRAVASTATIN SODIUM 40 MG PO TABS: 40.0000 mg | ORAL_TABLET | Freq: Every evening | ORAL | Status: DC

## 2014-08-24 NOTE — Assessment & Plan Note (Signed)
BP Readings from Last 3 Encounters:  08/24/14 138/65  07/27/14 140/85  06/04/14 137/75    Lab Results  Component Value Date   NA 139 12/18/2013   K 3.8 12/18/2013   CREATININE 0.64 12/18/2013    Assessment: Blood pressure control:  Acceptable Progress toward BP goal:   At goal Comments: Complaint withy Lisinopril- HCTZ- 20-25mg  daily.  Plan: Medications:  continue current medications Educational resources provided:   Self management tools provided:   Other plans: Bmet today.

## 2014-08-24 NOTE — Progress Notes (Signed)
Patient ID: Angie Cain, female   DOB: 03-28-1956, 58 y.o.   MRN: [id number]   Case discussed with Dr. Denton Brick at the time of the visit.  We reviewed the resident's history and exam and pertinent patient test results.  I agree with the assessment, diagnosis, and plan of care documented in the resident's note.

## 2014-08-24 NOTE — Patient Instructions (Signed)
Low Blood Sugar Low blood sugar (hypoglycemia) means that the level of sugar in your blood is lower than it should be. Signs of low blood sugar include:  Getting sweaty.  Feeling hungry.  Feeling dizzy or weak.  Feeling sleepier than normal.  Feeling nervous.  Headaches.  Having a fast heartbeat. Low blood sugar can happen fast and can be an emergency. Your doctor can do tests to check your blood sugar level. You can have low blood sugar and not have diabetes. HOME CARE  Check your blood sugar as told by your doctor. If it is less than 70 mg/dl or as told by your doctor, take 1 of the following:  3 to 4 glucose tablets.   cup clear juice.   cup soda pop, not diet.  1 cup milk.  5 to 6 hard candies.  Recheck blood sugar after 15 minutes. Repeat until it is at the right level.  Eat a snack if it is more than 1 hour until the next meal.  Only take medicine as told by your doctor.  Do not skip meals. Eat on time.  Do not drink alcohol except with meals.  Check your blood glucose before driving.  Check your blood glucose before and after exercise.  Always carry treatment with you, such as glucose pills.  Always wear a medical alert bracelet if you have diabetes. GET HELP RIGHT AWAY IF:   Your blood glucose goes below 70 mg/dl or as told by your doctor, and you:  Are confused.  Are not able to swallow.  Pass out (faint).  You cannot treat yourself. You may need someone to help you.  You have low blood sugar problems often.  You have problems from your medicines.  You are not feeling better after 3 to 4 days.  You have vision changes. MAKE SURE YOU:   Understand these instructions.  Will watch this condition.  Will get help right away if you are not doing well or get worse.   We will be increasing your insulin to 12 u once a day. Please please please work with Butch Penny to get your meals and blood sugar back on track.   Come back with your meter in  2-3 weeks.

## 2014-08-24 NOTE — Assessment & Plan Note (Signed)
Lab Results  Component Value Date   HGBA1C 7.7 06/04/2014   HGBA1C 6.9 12/18/2013   HGBA1C 7.5 09/18/2013     Assessment: Diabetes control:  Uncontrolled Progress toward A1C goal:   Deteriotated Comments: Complaint with lantus 10u daily now, and metformin- 850mg  BID. Pt tolerates both well. No hypoglycemic sym,ptoms. Glucometer downloaded, CBGs- since last visit- 07/27/2014- Range- 80-286. Average- 200s. Pt says she has been eating very heavy meals mostly at night, this is very different from usual for her.   Plan: Medications:  Will increase lantus Insulin to 12 u daily, can consider increasing to previous dose of 15u if CBGs still elevated on next visit.   Home glucose monitoring: Frequency:  Once daily Timing:  before breakfast. Instruction/counseling given: reminded to bring blood glucose meter & log to each visit and discussed diet Educational resources provided:   hypoglycemia symptoms. Self management tools provided:  Glucometer log Other plans: Pt to see our nutritionist today, also counselled on the importance of dietary discretion. Pt promises to adjust her diet and be consistent with it.

## 2014-08-24 NOTE — Progress Notes (Signed)
Follow-up visit:  [facility]  Medical Nutrition Therapy:  Appt start time: 1500 end time:  9150. Last visit was 07/2013 MEDICATIONS lantus 12 units just increased from 10 units nightly today, metformin- pt reports adherence today, but note at last visit pt was requesting extended release metformin because she was inadvertently omitting her Pm dose on occasion.  Meter download reviewed: sporadic elevations in CBGs with little pattern noted. Most CBG in 100s.  Assessment:  Primary concerns today: Blood sugar control. Has had more blood sugar spikes lately Says she still eats at home most meals. Less meat, recently more sweets. Reports marijuana affects appetite.  24-hr recall: B ( gets up late)- grits, coffee with cream; L ( PM)-not specified at today's visit ;  D ( late- 9 PM)- chicken, fish, sweet potato [employer] sliders with curly fries ; Snk ( PM)- wine, mixed drinks . Patient aware of carbs and foods that increase blood sugars and aware of when she has portions that are too large and too often. Does not indicate a desire to follow a more rigid meal plan. Recent physical activity includes per patient very little other than taking out trash.  Progress Towards Goal(s):  In progress.   Nutritional Diagnosis:  NB-2.1 Physical inactivity As related to no regular physical activity.  As evidenced by her report.    Intervention:  Nutrition education/health coaching about goal setting to assist her in moving ahead with her stated goal of taking walks again like she used to do. .  Monitoring/Evaluation:  Dietary intake, exercise, meter in 4 weeks

## 2014-08-24 NOTE — Progress Notes (Signed)
Patient ID: Angie Cain, female   DOB: 1956-11-01, 58 y.o.   MRN: [id number]   Subjective:   Patient ID: Angie Cain female   DOB: 1956/04/04 58 y.o.   MRN: [id number]  HPI: [name] is a 58 y.o. With PMH listed below. Presented today for follow up visit.  Past Medical History  Diagnosis Date  . Type II or unspecified type diabetes mellitus with neurological manifestations, not stated as uncontrolled     On insulin, gabapentin for neuropathy  . Atherosclerotic peripheral vascular disease with intermittent claudication     s/p fem-fem bypass 2011. ABI 07/2012 0.5 R 0.65 L  . Hypertension   . GERD (gastroesophageal reflux disease)   . Tobacco abuse   . Allergy   . Anxiety     occasional  . Arthritis   . Hyperlipidemia   . Personal history of colonic polyps - adenomas 03/07/2014   Current Outpatient Prescriptions  Medication Sig Dispense Refill  . calcium citrate-vitamin D (CITRACAL+D) 315-200 MG-UNIT per tablet Take 2 tablets by mouth 2 (two) times daily.  100 tablet  3  . gabapentin (NEURONTIN) 300 MG capsule Take 1 capsule (300 mg total) by mouth 3 (three) times daily.  90 capsule  1  . glucose blood (ACCU-CHEK AVIVA) test strip 1 each by Other route 4 (four) times daily. Use 3 to 4 times daily to check blood sugar, diag code 250.60. Insulin dependent  100 each  6  . hydrOXYzine (ATARAX/VISTARIL) 10 MG tablet Take 1 tablet (10 mg total) by mouth 3 (three) times daily as needed for itching.  30 tablet  2  . insulin glargine (LANTUS) 100 UNIT/ML injection Inject 0.12 mLs (12 Units total) into the skin at bedtime.  30 mL  4  . lisinopril-hydrochlorothiazide (PRINZIDE,ZESTORETIC) 20-25 MG per tablet Take 1 tablet by mouth daily.  30 tablet  2  . naproxen (NAPROSYN) 250 MG tablet Take 1 tablet (250 mg total) by mouth 2 (two) times daily as needed.  30 tablet  1  . nicotine (NICODERM CQ - DOSED IN MG/24 HOURS) 14 mg/24hr patch Place 1 patch (14 mg total) onto the skin  daily.  30 patch  2  . omeprazole (PRILOSEC) 20 MG capsule Take 1 capsule (20 mg total) by mouth as needed.  30 capsule  2  . pravastatin (PRAVACHOL) 40 MG tablet Take 1 tablet (40 mg total) by mouth every evening.  30 tablet  11  . [DISCONTINUED] metFORMIN (GLUCOPHAGE) 850 MG tablet Take 1 tablet (850 mg total) by mouth 2 (two) times daily with a meal.  60 tablet  6   No current facility-administered medications for this visit.   Family History  Problem Relation Age of Onset  . Diabetes Mother   . Diabetes Father   . Diabetes Sister   . Hypertension Sister   . Diabetes Brother   . Hypertension Brother   . Colon cancer Neg Hx   . Esophageal cancer Neg Hx   . Stomach cancer Neg Hx   . Rectal cancer Neg Hx    History   Social History  . Marital Status: Married    Spouse Name: N/A    Number of Children: N/A  . Years of Education: N/A   Social History Main Topics  . Smoking status: Current Every Day Smoker -- 0.20 packs/day    Types: Cigarettes  . Smokeless tobacco: Never Used     Comment: 4-6 cigs/day  . Alcohol Use: 0.0 oz/week    1-2  Glasses of wine per week     Comment: Occasionally  . Drug Use: No  . Sexual Activity: Not on file   Other Topics Concern  . Not on file   Social History Narrative  . No narrative on file   Review of Systems: CONSTITUTIONAL- No Fever, weightloss, night sweat, pt endorses increased quantity of food intake at night, say she is a night binger. SKIN- No Rash, colour changes or itching. HEAD- No Headache or dizziness. EYES- No Vision loss, pain, redness, double or blurred vision. EARS- No vertigo, hearing loss or ear discharge. Mouth/throat- No Sorethroat, dentures, or bleeding gums. RESPIRATORY- No Cough or SOB. CARDIAC- No Palpitations, DOE, PND or chest pain. GI- No nausea, vomiting, diarrhoea, constipation, abd pain. URINARY- No Frequency, urgency, straining or dysuria. NEUROLOGIC- No Numbness, syncope, seizures, but has occasional  pain in her feet. [facility]- Denies depression or anxiety.  Objective:  Physical Exam: Filed Vitals:   08/24/14 1409  BP: 138/65  Pulse: 61  Temp: 97.8 F (36.6 C)  TempSrc: Oral  Height: 5\' 5"  (1.651 m)  Weight: 139 lb 12.8 oz (63.413 kg)  SpO2: 100%   GENERAL- alert, co-operative, appears as stated age, not in any distress. HEENT- Atraumatic, normocephalic, PERRL, EOMI, oral mucosa appears moist, neck supple. CARDIAC- RRR, no murmurs, rubs or gallops. RESP- Moving equal volumes of air, and clear to auscultation bilaterally, no wheezes or crackles. ABDOMEN- Soft, nontender, no guarding or rebound,  no CVA tenderness. NEURO- No obvious Cr N abnormality, strenght upper and lower extremities- intact, Gait- Normal. EXTREMITIES- Lower extremities warm and well perfused, no pedal edema. SKIN- Warm, dry, No rash or lesion. PSYCH- Normal mood and affect, appropriate thought content and speech.  Assessment & Plan:   The patient's case and plan of care was discussed with attending physician, Dr. Ellwood Dense.  Please see problem based charting for assessment and plan.

## 2014-09-21 ENCOUNTER — Ambulatory Visit: Payer: Medicaid Other | Admitting: Internal Medicine

## 2014-09-21 ENCOUNTER — Encounter: Payer: Medicaid Other | Admitting: Dietician

## 2014-09-25 ENCOUNTER — Ambulatory Visit: Payer: Medicaid Other | Admitting: Internal Medicine

## 2014-09-25 ENCOUNTER — Encounter: Payer: Medicaid Other | Admitting: Dietician

## 2014-09-28 ENCOUNTER — Encounter: Payer: Medicaid Other | Admitting: Dietician

## 2014-09-28 ENCOUNTER — Ambulatory Visit (INDEPENDENT_AMBULATORY_CARE_PROVIDER_SITE_OTHER): Payer: Medicaid Other | Admitting: Internal Medicine

## 2014-09-28 ENCOUNTER — Encounter: Payer: Self-pay | Admitting: Internal Medicine

## 2014-09-28 VITALS — BP 134/73 | HR 75 | Temp 98.4°F | Ht 65.0 in | Wt 141.4 lb

## 2014-09-28 DIAGNOSIS — R21 Rash and other nonspecific skin eruption: Secondary | ICD-10-CM

## 2014-09-28 DIAGNOSIS — M79672 Pain in left foot: Secondary | ICD-10-CM

## 2014-09-28 DIAGNOSIS — E1165 Type 2 diabetes mellitus with hyperglycemia: Secondary | ICD-10-CM

## 2014-09-28 DIAGNOSIS — M79671 Pain in right foot: Secondary | ICD-10-CM

## 2014-09-28 LAB — GLUCOSE, CAPILLARY: Glucose-Capillary: 204 mg/dL — ABNORMAL HIGH (ref 70–99)

## 2014-09-28 LAB — POCT GLYCOSYLATED HEMOGLOBIN (HGB A1C): Hemoglobin A1C: 8.4

## 2014-09-28 NOTE — Patient Instructions (Signed)
General Instructions:  We will be decreasing the dose of your insulin to 10 units once a day. Please continue with all your other medications as directed.  For your rash, we will hold off on treatment for now, and when you come in for your next visit we will reaccess you.  We will also refer you to a foot doctor for the pain in your feet.  Please bring your medicines with you each time you come to clinic.  Medicines may include prescription medications, over-the-counter medications, herbal remedies, eye drops, vitamins, or other pills.

## 2014-09-28 NOTE — Progress Notes (Signed)
Patient ID: Angie Cain, female   DOB: August 27, 1956, 58 y.o.   MRN: [id number]   Subjective:   Patient ID: Angie Cain female   DOB: [birth year] 58 y.o.   MRN: [id number]  HPI: [name] is a 58 y.o. female who presented today for follow up on her diabetes, with complaints today of Bilat lower extremity pain in her feet- under surface. Present mostly with weight bearing. No prior trauma. Describes the pain as aching. Present for a while, cannot specify exactly as she also has neuropathy with burning in her feet and is mostly controlled on gabapentin. Pt also complaints of her rash on her upper back- between her shoulder blades, not itchy, not tender, dark in colour, cannot tell how long its been there, as he husband noticed it only 3 days ago.  Denies change in creams, or soaps, new food. No joint pain or swelling.   Past Medical History  Diagnosis Date  . Type II or unspecified type diabetes mellitus with neurological manifestations, not stated as uncontrolled     On insulin, gabapentin for neuropathy  . Atherosclerotic peripheral vascular disease with intermittent claudication     s/p fem-fem bypass 2011. ABI 07/2012 0.5 R 0.65 L  . Hypertension   . GERD (gastroesophageal reflux disease)   . Tobacco abuse   . Allergy   . Anxiety     occasional  . Arthritis   . Hyperlipidemia   . Personal history of colonic polyps - adenomas 03/07/2014   Review of Systems: CONSTITUTIONAL- No Fever, weightloss, night sweat or change in appetite. SKIN- Rash present on back, HEAD- No Headache or dizziness. EYES- No Vision loss, pain, redness, double or blurred vision. EARS- No vertigo, hearing loss or ear discharge. Mouth/throat- No Sorethroat, dentures, or bleeding gums. RESPIRATORY- No Cough or SOB. CARDIAC- No Palpitations, DOE, PND or chest pain. GI- No nausea, vomiting, diarrhoea, constipation, abd pain. URINARY- No Frequency, urgency, straining or dysuria. NEUROLOGIC- No  Numbness, syncope, seizures. [facility]- Denies depression or anxiety.  Objective:  Physical Exam: Filed Vitals:   09/28/14 1048  BP: 134/73  Pulse: 75  Temp: 98.4 F (36.9 C)  TempSrc: Oral  Height: 5\' 5"  (1.651 m)  Weight: 141 lb 6.4 oz (64.139 kg)  SpO2: 100%   GENERAL- alert, co-operative, appears as stated age, not in any distress. HEENT- Atraumatic, normocephalic, PERRL, EOMI, oral mucosa appears moist, neck supple. CARDIAC- RRR, no murmurs, rubs or gallops. RESP- Moving equal volumes of air, and clear to auscultation bilaterally, no wheezes or crackles. ABDOMEN- Soft, nontender, no guarding or rebound, no palpable masses or organomegaly, bowel sounds present. BACK- Normal curvature of the spine, ~8 by 8cm hyperpigmented patch between the medial sides of the scapular posteriorly. Feels dry to velvety, definitely different from surrounding skin on palpation, not tender, circular border.  NEURO- No obvious Cr N abnormality, strenght upper and lower extremities- 5/5, Sensation intact- globally, DTRs- Normal, finger to nose test normal bilat, rapid alternating movement- intact, Gait- Normal. EXTREMITIES- pulse 2+, symmetric, no pedal edema,on deep palpation, i can appreciate a ~3 by ~3cm ?nodular lesion on the soles of the feet- bilat, non tender, firm on palpation, within the tendons/soft tissue, present on the medial surface about the tarsal- metatarsal joints.   SKIN- Warm, dry, No rash or lesion. PSYCH- Normal mood and affect, appropriate thought content and speech.  Assessment & Plan:   The patient's case and plan of care was discussed with attending physician, Dr. Lynnae January.  Please see problem based  charting for assessment and plan.

## 2014-09-30 DIAGNOSIS — M722 Plantar fascial fibromatosis: Secondary | ICD-10-CM | POA: Insufficient documentation

## 2014-09-30 NOTE — Assessment & Plan Note (Deleted)
Rash exact etiology not identified at this time- ~8 by ~8 cm hyperpig rash between shoulder blades, not pruritic. Differentials- Acanthosis Nigricans- But position of rash is not typical. Also consider scleroderma diabeticorum- Position suggestive but patient rash is well demarcated, without thickening of the skin or reduced mobility of neck or shoulder. Both rash are seen in diabetic patients.   Plan- Rash presently is not an issue for patient. Hesitant to use hydrocortisone cream as i am not sure what it is. Topical retinoids can be used for acanthosis nigricans. If this is an issue for patient at follow up visit, consider Dermatology referral, or biopsy with subsequent treatment.

## 2014-09-30 NOTE — Assessment & Plan Note (Addendum)
Firm Soft tissue nodules palpated on the under surface of both feet, causing pain with weight bearing. Differentials include Planter fibromas/fibromatosis.   Plan- Will refer to podiatry, might benefit from steroid injections and/or orthotics.

## 2014-09-30 NOTE — Assessment & Plan Note (Signed)
Lab Results  Component Value Date   HGBA1C 8.4 09/28/2014   HGBA1C 7.7 06/04/2014   HGBA1C 6.9 12/18/2013     Assessment: Diabetes control:  Uncontrolled Progress toward A1C goal:   Deteriorated Comments: Pt brought her glucometer today. Readings- 64- 312. Most readings- Average- 146. Number of readings 46. Pt blood sugars have showed marked improvement- 4 readings above 200. One reading below 80. Fasting- mostly low to mid 100s.  Pt cannot exactly tell the circumstances around the fasting of 64 she got. Complaint with Lantus 12u and metformin 850mg  TID. Pt says she has stopped eating at night and has been working on her diet. Pt is working with our Dm co-ordinator.  Plan: Medications:  Will reduce lantus to 10u daily, considering the reading of 64, as pt is contining to work on her diet.  Home glucose monitoring: Frequency:   Timing:   Instruction/counseling given: reminded to bring blood glucose meter & log to each visit, reminded to bring medications to each visit and discussed diet Educational resources provided:   Self management tools provided:   Other plans: Pt abit upset about her HgBa1c,a reflection of her prior high blood sugars, but i encouraged her if she continues with dietary discretion and taking her meds, her next HgBA1c will be better.  Pt to see Butch Penny.

## 2014-10-01 NOTE — Assessment & Plan Note (Signed)
Rash appearance very suggestIve of early/mild acanthosis nigricans seen in diabetes- 8cm  by ~8 cm hyperpig rash between shoulder blades, not pruritic. But position of rash is not typical- as pts rash is not in her axilla or neck, but this location in patient is still possible. Also unlikely rash has been present for up to a month per patient.  No featS suggestive if underlying malignancy as cause of rash. Also consider scleroderma diabeticorum- Position suggestive but patient rash is well demarcated, without thickening of the skin or reduced mobility of neck or shoulder. Both rash are seen in diabetic patients.  -Unlikely melanoma with acuity of large size of rash, no variegation of colour, no irregular border, no assymetry.  Plan- Rash presently is not an issue for patient. Hesitant to use hydrocortisone cream as i am not sure what it is. Topical retinoids or vit d can be used for acanthosis nigricans. If this is an issue for patient at follow up visit,  -Consider  Low threshold for Dermatology referral and/or biopsy when pt follows up in one month, if any change in characteristic of rash - monitor for underlying maliganancy especially GI. Last colonoscopy 02/2014- five sessile polyps seen.

## 2014-10-02 NOTE — Progress Notes (Addendum)
Internal Medicine Clinic Attending  Case discussed with Dr. Emokpae soon after the resident saw the patient.  We reviewed the resident's history and exam and pertinent patient test results.  I agree with the assessment, diagnosis, and plan of care documented in the resident's note. 

## 2014-10-05 LAB — HM DIABETES EYE EXAM

## 2014-10-10 ENCOUNTER — Encounter: Payer: Self-pay | Admitting: *Deleted

## 2014-10-12 ENCOUNTER — Other Ambulatory Visit: Payer: Self-pay | Admitting: Dietician

## 2014-10-12 ENCOUNTER — Other Ambulatory Visit: Payer: Self-pay | Admitting: *Deleted

## 2014-10-12 DIAGNOSIS — E1165 Type 2 diabetes mellitus with hyperglycemia: Secondary | ICD-10-CM

## 2014-10-12 DIAGNOSIS — E1136 Type 2 diabetes mellitus with diabetic cataract: Secondary | ICD-10-CM

## 2014-10-12 DIAGNOSIS — L299 Pruritus, unspecified: Secondary | ICD-10-CM

## 2014-10-12 NOTE — Telephone Encounter (Signed)
Patient says insurance stopped covering strips this month. CDE is unable to get through to pharmacy, suggest we resend Rx with updated ICD 10 codes. Patient agrees to leave CDE a message if still not able to get her strips after new Rx sent.

## 2014-10-12 NOTE — Telephone Encounter (Signed)
Also pt states she is still bothered by rash between shoulder blades.  She thought a cream was going to be called in for her. Please read the note from that visit :  Rash and nonspecific skin eruption - Angie Roys, MD at 10/01/2014 10:30 AM

## 2014-10-14 MED ORDER — GLUCOSE BLOOD VI STRP: ORAL_STRIP | Status: DC

## 2014-10-14 NOTE — Telephone Encounter (Signed)
I updated to ICD-10. Let's see if it goes through.

## 2014-10-19 ENCOUNTER — Encounter: Payer: Self-pay | Admitting: Internal Medicine

## 2014-10-19 DIAGNOSIS — E113299 Type 2 diabetes mellitus with mild nonproliferative diabetic retinopathy without macular edema, unspecified eye: Secondary | ICD-10-CM

## 2014-10-19 DIAGNOSIS — H524 Presbyopia: Secondary | ICD-10-CM

## 2014-10-19 NOTE — Progress Notes (Unsigned)
Patient ID: Angie Cain, female   DOB: 05-Dec-1956, 58 y.o.   MRN: [id number]  I have reviewed the office notes from Dr. Marylynn Pearson ([facility]) for the office visit dated 10/05/14 and plan to make the following changes:  I added mild nonproliferative diabetic retinopathy without macular edema to the problem list  She also has presbyopia.   She was given a prescription for new glasses.  She is due to follow-up in 4 months. I will be sure to reiterate it in my next appointment with her.

## 2014-10-23 ENCOUNTER — Ambulatory Visit (INDEPENDENT_AMBULATORY_CARE_PROVIDER_SITE_OTHER): Payer: Medicaid Other | Admitting: Podiatry

## 2014-10-23 ENCOUNTER — Ambulatory Visit (INDEPENDENT_AMBULATORY_CARE_PROVIDER_SITE_OTHER): Payer: Medicaid Other

## 2014-10-23 ENCOUNTER — Encounter: Payer: Self-pay | Admitting: Podiatry

## 2014-10-23 VITALS — BP 110/63 | HR 75 | Resp 16 | Ht 65.0 in | Wt 140.0 lb

## 2014-10-23 DIAGNOSIS — M2010 Hallux valgus (acquired), unspecified foot: Secondary | ICD-10-CM | POA: Diagnosis not present

## 2014-10-23 DIAGNOSIS — M722 Plantar fascial fibromatosis: Secondary | ICD-10-CM

## 2014-10-23 MED ORDER — MELOXICAM 15 MG PO TABS: 15.0000 mg | ORAL_TABLET | Freq: Every day | ORAL | Status: DC

## 2014-10-23 NOTE — Progress Notes (Signed)
   Subjective:    Patient ID: Angie Cain, female    DOB: 20-Apr-1956, 58 y.o.   MRN: [id number]  HPI Comments: Bilateral heel pain, been going on for a while now. Also having pain in the big toes. Diabetic for over 10 years last a1c was 8.0   Foot Pain      Review of Systems  Endocrine:       Diabetes   All other systems reviewed and are negative.      Objective:   Physical Exam: I have reviewed her past medical history medications allergies surgery social history and review of systems area pulses are strongly palpable bilateral. Neurologic sensorium is intact per Semmes-Weinstein monofilament. Deep tendon reflexes are intact bilateral muscle strength +5 over 5 dorsiflexion plantar flexors and inverters and everters all intrinsic musculature is intact. Orthopedic evaluation of strength all joints distal to the ankle for range of motion without crepitation. She has hallux abductovalgus of disorder which is painful on palpation and range of motion of the first metatarsophalangeal joint. She has also has pain on palpation to the medial calcaneal tubercles bilateral. Radiographic evaluation confirms the aforementioned hallux abductovalgus disorder and plantar fasciitis as a soft tissue increase in density at the plantar fascial calcaneal insertion site is indicative of this.        Assessment & Plan:  Assessment: Plantar fasciitis bilateral. Bunion deformity bilateral.  Plan: Discussed etiology pathology conservative versus surgical therapies. We discussed appropriate shoe gear stretching exercises ice therapy and shoe gear modification. I injected her bilateral heels today to the point of maximal tenderness. Plantar fascial braces were dispensed as was a night splint. I also wrote a prescription for meloxicam 15 mg tablets 1 by mouth daily. I will follow up with her in 1 month.

## 2014-10-26 ENCOUNTER — Ambulatory Visit (INDEPENDENT_AMBULATORY_CARE_PROVIDER_SITE_OTHER): Payer: Medicaid Other | Admitting: Internal Medicine

## 2014-10-26 ENCOUNTER — Encounter: Payer: Self-pay | Admitting: Internal Medicine

## 2014-10-26 VITALS — BP 141/72 | HR 65 | Temp 98.2°F | Ht 65.0 in | Wt 138.6 lb

## 2014-10-26 DIAGNOSIS — L989 Disorder of the skin and subcutaneous tissue, unspecified: Secondary | ICD-10-CM

## 2014-10-26 DIAGNOSIS — Z794 Long term (current) use of insulin: Secondary | ICD-10-CM

## 2014-10-26 DIAGNOSIS — I10 Essential (primary) hypertension: Secondary | ICD-10-CM | POA: Diagnosis not present

## 2014-10-26 DIAGNOSIS — Z23 Encounter for immunization: Secondary | ICD-10-CM | POA: Diagnosis present

## 2014-10-26 DIAGNOSIS — IMO0002 Reserved for concepts with insufficient information to code with codable children: Secondary | ICD-10-CM

## 2014-10-26 DIAGNOSIS — E1165 Type 2 diabetes mellitus with hyperglycemia: Secondary | ICD-10-CM

## 2014-10-26 DIAGNOSIS — R21 Rash and other nonspecific skin eruption: Secondary | ICD-10-CM | POA: Diagnosis not present

## 2014-10-26 DIAGNOSIS — M722 Plantar fascial fibromatosis: Secondary | ICD-10-CM | POA: Diagnosis not present

## 2014-10-26 MED ORDER — METFORMIN HCL 850 MG PO TABS: 850.0000 mg | ORAL_TABLET | Freq: Two times a day (BID) | ORAL | Status: DC

## 2014-10-26 NOTE — Assessment & Plan Note (Signed)
  BP Readings from Last 3 Encounters:  10/26/14 141/72  10/23/14 110/63  09/28/14 134/73    Lab Results  Component Value Date   NA 138 08/24/2014   K 4.3 08/24/2014   CREATININE 0.69 08/24/2014    Assessment: Blood pressure control: controlled Progress toward BP goal:  at goal Comments: Stable on current regimen  Plan: Medications:  lisinopril-HCTZ 20-25mg  QD Educational resources provided: brochure Self management tools provided:   Other plans: Continue current regimen.

## 2014-10-26 NOTE — Patient Instructions (Addendum)
It was a pleasure seeing you today!  For your rash, try over the counter hydrocortisone cream, and we will working on getting you a referral to dermatology.   For your diabetes, please take Lantus 12 units and metformin 850mg  TWICE a day.   For your foot pain, please continue Naproxen OR Mobic but DON'T take BOTH.  Please bring your medicines with you each time you come to clinic.  Medicines may include prescription medications, over-the-counter medications, herbal remedies, eye drops, vitamins, or other pills.

## 2014-10-26 NOTE — Telephone Encounter (Signed)
At her appointment today, I mentioned that hydrocortisone cream is OTC and does not need a prescription.

## 2014-10-26 NOTE — Progress Notes (Signed)
   Subjective:    Patient ID: Angie Cain, female    DOB: 11/13/1956, 58 y.o.   MRN: [id number]  HPI Ms. Yellin is a 58 year old female with DM2, HTN, GERD, PVD who presents today for a routine office visit.  Please see assessment & plan for documentation of each problem.  Review of Systems  Constitutional: Negative for fever and appetite change.  Respiratory: Negative for shortness of breath.   Cardiovascular: Negative for chest pain.  Endocrine: Negative for polyuria.  Skin: Positive for rash.       Objective:   Physical Exam Constitutional: She is oriented to person, place, and time. She appears well-developed and well-nourished. No distress.  HENT:  Head: Normocephalic and atraumatic.  Eyes: Conjunctivae are normal. Pupils are equal, round, and reactive to light.  Cardiovascular: Normal rate, regular rhythm and normal heart sounds.  Exam reveals no gallop and no friction rub.   No murmur heard. Pulmonary/Chest: Effort normal. No respiratory distress. She has no wheezes. She has no rales.  Abdominal: Soft. Bowel sounds are normal. She exhibits no distension. There is no tenderness.  Neurological: She is alert and oriented to person, place, and time. No cranial nerve deficit. Coordination normal.  Skin: Multiple hyperpigmented papules present between shoulder blades; not tender to touch though appear dry. Lesion present behind her neck, ovoid-shaped (1cm x 0.5cm), hyperpigmented Psychiatric: Her behavior is normal.         Assessment & Plan:

## 2014-10-26 NOTE — Assessment & Plan Note (Signed)
Overview She appreciated her recent podiatry and has been using the braces. She is holding her Naproxen for now since she was started on Mobic 15mg  QD and wants to give this new medication a shot before switching back to Naproxen.  Assessment -Improved from prior visit  Plan -Continue current management -Clarified the use of Mobic vs. Naproxen and that both are of the same class and should not be used together

## 2014-10-26 NOTE — Assessment & Plan Note (Signed)
Overview: She reports CBGs trending in the 200s and attributes this to not eating well and drinking the wine her husband brings her. She tries to drink water but does mix it up with flavoring using Crystal [employer]ite. Her current regimen is metformin 850mg  TID and Lantus 10 units; last several notes have charted Lantus 12 units though it was her understanding that she was to decrease her Lantus to 10 units. Glucometer readings confirm her self-report for this month though CBGs trending 100s in October.  Lab Results  Component Value Date   HGBA1C 8.4 09/28/2014   HGBA1C 7.7 06/04/2014   HGBA1C 6.9 12/18/2013     Assessment: Diabetes control: fair control Progress toward A1C goal:  deteriorated Comments:  -She has insight into what she needs to do better but struggles with husband.  Plan: Medications:  Lantus 12 units; metformin 850mg  BID Home glucose monitoring: Frequency: once a day Timing: before breakfast Instruction/counseling given: discussed diet Educational resources provided: brochure Self management tools provided: copy of home glucose meter download, home glucose logbook Other plans:  -Clarified her insulin regimen and when to dispose of insulin (after 28 days of having opened it) -Encouraged her that she had been making progress and just needs to repeat it -Refilled metformin as it had fallen off her MAR on our system

## 2014-10-26 NOTE — Assessment & Plan Note (Addendum)
Overview Last time, she was to receive hydrocortisone cream to help her with her itchiness but never received it since it was never filled. She still complains of itchiness and feels it's worse when she eats crustaceans, like the shrimp she had recently. She takes hydrocortisone pills whenever she does have the itchiness. The rash has not spread and is still limited to between her shoulder blades.  Assessment -Possibly acanthosis nigricans given her fair control of A1c but will likely need referral -May also be a marker for malignancy though most recently colonoscopy showed only tubular adenomas not concerning for cancer  Plan -Refer to dermatology for further evaluation -Recommended OTC hydrocortisone cream for itching

## 2014-10-26 NOTE — Assessment & Plan Note (Signed)
Overview She complains of some itching around this lesion on the back of her neck. It has been there for 10 years but has never had any drainage, inflammation though she does feel it has been growing over the past years.  Assessment -Differential includes lipoma, dermatofibroma, keratoacanthoma, acrochordon -Stable for now and does not sound to be rapidly changing or advancing  Plan -Refer to dermatology for further evaluation

## 2014-10-26 NOTE — Progress Notes (Signed)
Internal Medicine Clinic Attending  I saw and evaluated the patient.  I personally confirmed the key portions of the history and exam documented by Dr. Patel and I reviewed pertinent patient test results.  The assessment, diagnosis, and plan were formulated together and I agree with the documentation in the resident's note.  

## 2014-10-29 LAB — GLUCOSE, CAPILLARY: GLUCOSE-CAPILLARY: 180 mg/dL — AB (ref 70–99)

## 2014-11-06 NOTE — Addendum Note (Signed)
Addended by: Riccardo Dubin on: 11/06/2014 05:50 PM   Modules accepted: Miquel Dunn

## 2014-11-20 ENCOUNTER — Encounter: Payer: Self-pay | Admitting: Podiatry

## 2014-11-20 ENCOUNTER — Ambulatory Visit (INDEPENDENT_AMBULATORY_CARE_PROVIDER_SITE_OTHER): Payer: Medicaid Other | Admitting: Podiatry

## 2014-11-20 VITALS — BP 110/63 | HR 75 | Resp 16

## 2014-11-20 DIAGNOSIS — M722 Plantar fascial fibromatosis: Secondary | ICD-10-CM

## 2014-11-20 DIAGNOSIS — M2010 Hallux valgus (acquired), unspecified foot: Secondary | ICD-10-CM

## 2014-11-20 NOTE — Progress Notes (Signed)
She presents today for follow-up of her plantar fasciitis bilateral. She states that seems to be doing pretty well. She is concerned about the bunion development.  Objective: Vital signs are stable she is alert and oriented 3. Pulses are palpable bilateral. She has no pain on palpation medial continued tubercles bilateral. Mild hallux abductovalgus deformity is noted bilateral.  Assessment: Plantar fasciitis resolving bilateral. Hallux bilateral.  Plan: Continue with all conservative therapies and follow up with me as needed

## 2014-12-19 ENCOUNTER — Other Ambulatory Visit: Payer: Self-pay | Admitting: Internal Medicine

## 2014-12-19 ENCOUNTER — Encounter: Payer: Self-pay | Admitting: Internal Medicine

## 2014-12-19 DIAGNOSIS — IMO0002 Reserved for concepts with insufficient information to code with codable children: Secondary | ICD-10-CM

## 2014-12-19 DIAGNOSIS — E1165 Type 2 diabetes mellitus with hyperglycemia: Secondary | ICD-10-CM

## 2014-12-19 DIAGNOSIS — Z794 Long term (current) use of insulin: Principal | ICD-10-CM

## 2014-12-19 NOTE — Addendum Note (Signed)
Addended by: Riccardo Dubin on: 12/19/2014 02:44 PM   Modules accepted: Orders, SmartSet

## 2014-12-19 NOTE — Progress Notes (Signed)
Patient ID: Angie Cain, female   DOB: Mar 25, 1956, 59 y.o.   MRN: [id number]  I have reviewed the office notes from Dr. Mickel Fuchs  for the office visit dated 11/27/14:  Diagnosed skin lesion as dermatosis and cyst of skin with Dermatology follow-up as needed  There is no treatment plan included this note and will need to follow-up with their office to better understand what they decided to do.

## 2015-01-04 ENCOUNTER — Encounter: Payer: Medicaid Other | Admitting: Internal Medicine

## 2015-01-24 ENCOUNTER — Telehealth: Payer: Self-pay | Admitting: Internal Medicine

## 2015-01-24 NOTE — Telephone Encounter (Signed)
Call to patient to confirm appointment for 2/12 at 10:15 lmtcb

## 2015-01-25 ENCOUNTER — Encounter: Payer: Self-pay | Admitting: Internal Medicine

## 2015-01-25 ENCOUNTER — Ambulatory Visit (INDEPENDENT_AMBULATORY_CARE_PROVIDER_SITE_OTHER): Payer: Medicaid Other | Admitting: Internal Medicine

## 2015-01-25 VITALS — BP 136/66 | HR 69 | Temp 97.7°F | Wt 145.5 lb

## 2015-01-25 DIAGNOSIS — E1165 Type 2 diabetes mellitus with hyperglycemia: Secondary | ICD-10-CM

## 2015-01-25 DIAGNOSIS — Z794 Long term (current) use of insulin: Secondary | ICD-10-CM

## 2015-01-25 DIAGNOSIS — M8580 Other specified disorders of bone density and structure, unspecified site: Secondary | ICD-10-CM

## 2015-01-25 DIAGNOSIS — I10 Essential (primary) hypertension: Secondary | ICD-10-CM

## 2015-01-25 DIAGNOSIS — IMO0002 Reserved for concepts with insufficient information to code with codable children: Secondary | ICD-10-CM

## 2015-01-25 DIAGNOSIS — F17200 Nicotine dependence, unspecified, uncomplicated: Secondary | ICD-10-CM

## 2015-01-25 DIAGNOSIS — Z720 Tobacco use: Secondary | ICD-10-CM

## 2015-01-25 LAB — GLUCOSE, CAPILLARY: Glucose-Capillary: 176 mg/dL — ABNORMAL HIGH (ref 70–99)

## 2015-01-25 LAB — POCT GLYCOSYLATED HEMOGLOBIN (HGB A1C): Hemoglobin A1C: 8.2

## 2015-01-25 MED ORDER — CALCIUM CARBONATE-VITAMIN D 500-200 MG-UNIT PO TABS: 1.0000 | ORAL_TABLET | Freq: Two times a day (BID) | ORAL | Status: DC

## 2015-01-25 MED ORDER — METFORMIN HCL ER 750 MG PO TB24: 1500.0000 mg | ORAL_TABLET | Freq: Every day | ORAL | Status: DC

## 2015-01-25 NOTE — Progress Notes (Signed)
Medicine attending: Medical history, presenting problems, physical findings, and medications, reviewed with Dr Charlott Rakes and I concur with his evaluation and management plan.

## 2015-01-25 NOTE — Assessment & Plan Note (Signed)
BP Readings from Last 3 Encounters:  01/25/15 136/66  11/20/14 110/63  10/26/14 141/72    Lab Results  Component Value Date   NA 138 08/24/2014   K 4.3 08/24/2014   CREATININE 0.69 08/24/2014    Assessment: Blood pressure control: controlled Progress toward BP goal:  at goal Comments: Well-controlled on current regimen  Plan: Medications:  Lisinopril/HCTZ 20/25 mg Educational resources provided: brochure Self management tools provided:   Other plans: Continue current regimen

## 2015-01-25 NOTE — Assessment & Plan Note (Addendum)
Lab Results  Component Value Date   HGBA1C 8.2 01/25/2015   HGBA1C 8.4 09/28/2014   HGBA1C 7.7 06/04/2014     Assessment: Diabetes control: fair control Progress toward A1C goal:  unchanged Comments:  -Reports that she has not been taking Lantus 15 units last night she is seeing me but only for the last 2 weeks. Also skips Lantus and metformin when she feels her blood sugar is running low though note is that that was only once where her blood sugar was found to be under 100 in the morning.  -She brought her glucometer with her. On review, most of her initial values are trending within the 100s though there are several outliers: 75, 225, 216.  -Acknowledges that there is been significant stress at home, notably of which included her husband having to go to the hospital after taking muscle relaxants with alcohol and a feud with her brother.  -Also discloses that she enjoys drinking sweet wine and has been eating a lot more chocolate recently.  -She does try to walk in the evening for exercise and also to help curb her smoking habit.  Does not like the diarrhea the metformin gives her.  -Never tried metformin extended release since she still has some of her original metformin left over.   Plan: Medications:  Lantus 15 units at bedtime, metformin 850 mg twice a day Home glucose monitoring: Frequency: 2 times a day Timing: before breakfast, after dinner Instruction/counseling given: discussed diet and other instruction/counseling: Spoke with her about the importance of taking her medications regardless of her blood sugars as they do not contribute to her low readings, also is emphasized the importance of eating her meals regularly and consistently Educational resources provided: brochure Self management tools provided: copy of home glucose meter download Other plans:  -Emphasized to her the need for her to take her Lantus and her metformin regularly to get the biggest return on those  medications.  -Plan to refill metformin extended release when she runs out of her current metformin to which she is agreeable.  ADDENDUM 01/25/2015  12:46 PM:  -Spoke with Dr. Maudie Mercury recommended to start her on 1500 mg of extended release metformin and then to titrate up as she tolerates. Will reassess at her next visit in 3 months.

## 2015-01-25 NOTE — Assessment & Plan Note (Addendum)
  Assessment: Progress toward smoking cessation:  smoking the same amount Barriers to progress toward smoking cessation:  concern about weight gain, stress Comments:  -Reports that she smokes roughly 20 cigarettes per month but then also acknowledges that a pack usually lasts her about 5 days and that she often gets cigarettes from her friends so does not know the true amount that she smokes.  -Interested in quitting. Feels it is hard since she has been smoking for the last 40 years.  -Has had stretches where she has quit successfully, up to 30 days, though she cannot remember what may have contributed to such success.  -Also reports that she does not smoke entire cigarette but only takes "couple puffs " and smokes marijuana 3 times a week  Plan: Instruction/counseling given:  I commended patient for quitting and reviewed strategies for preventing relapses. Educational resources provided:  [employer] [employer] account manager) brochure Self management tools provided:  smoking cessation plan ([facility] Quit Plan) Medications to assist with smoking cessation:  None Patient agreed to the following self-care plans for smoking cessation: go to the [employer] (https://scott-booker.info/), cut down the number of cigarettes smoked, call [facility] (1-800-QUIT-NOW)  Other plans:  -Encouraged her to call the quit line to learn of any strategies on helping her curb her habit -Advised her to cut down on her marijuana if she can tolerate it as it may even paradoxically worsen her nausea in interacting with nicotine -Never filled the prescription for nicotine patches in the past though is interested in potentially getting them again so explained to her that she may get a trial supply if she calls the phone number -Reassess in 3 months

## 2015-01-25 NOTE — Progress Notes (Signed)
   Subjective:    Patient ID: Angie Cain, female    DOB: 17-Mar-1956, 59 y.o.   MRN: [id number]  HPI Angie Cain is a 59 year old female with DM2, HTN, GERD, PVD who presents today for a routine office visit. Please see assessment & plan for documentation of each problem.    Review of Systems  Constitutional: Negative for fever.  Respiratory: Negative for shortness of breath.   Cardiovascular: Negative for chest pain.  Gastrointestinal: Positive for nausea (Worse after she smokes cigarettes) and diarrhea (Stable, related to her metformin). Negative for abdominal pain.       Objective:   Physical Exam Constitutional: She is oriented to person, place, and time. Pleasant, in no distress.  HENT:  Head: Normocephalic and atraumatic.  Cardiovascular: Normal rate, regular rhythm and normal heart sounds.  Exam reveals no gallop and no friction rub.   No murmur heard. Pulmonary/Chest: Effort normal. No respiratory distress. She has no wheezes. She has no rales.  Extremities: No tibial edema noted bilaterally.  Neurological: She is alert and oriented to person, place, and time. Coordination normal.  Skin: Skin is warm and dry. She is not diaphoretic.  Psychiatric: Her behavior is normal.         Assessment & Plan:

## 2015-01-25 NOTE — Patient Instructions (Signed)
  Thank you for bringing your medicines today. This helps [place] keep you safe from mistakes.   We'll call you about your bone scan. In the meantime, please take 1 tablet twice a day to help your bones.  For your diabetes, please make sure you take Lantus 15 units and continue to take metformin. When you run out of them metformin, we will send in the new metformin.  We look for to see back in 3 months.

## 2015-01-25 NOTE — Assessment & Plan Note (Signed)
-  Acknowledged she is not taking the calcium/vitamin D supplementation that is listed on her medications list.  -Given that her T score was creeping up towards the range for osteoporosis and that it has been a number of years since it was last checked, recommended to her to repeat her DEXA scan which she is agreeable.  -Also sent for Os-Cal and advised her to continue to work on cutting down on smoking as it has been associated with bone density loss.

## 2015-02-05 ENCOUNTER — Ambulatory Visit (INDEPENDENT_AMBULATORY_CARE_PROVIDER_SITE_OTHER): Payer: Medicaid Other | Admitting: Podiatry

## 2015-02-05 DIAGNOSIS — M722 Plantar fascial fibromatosis: Secondary | ICD-10-CM

## 2015-02-05 MED ORDER — DICLOFENAC SODIUM 75 MG PO TBEC
75.0000 mg | DELAYED_RELEASE_TABLET | Freq: Two times a day (BID) | ORAL | Status: DC
Start: 2015-02-05 — End: 2016-01-17

## 2015-02-05 NOTE — Progress Notes (Signed)
She presents today for follow-up of plantar fasciitis left foot. She states that it was doing good for a while but it has returned. She states that she would like to see about fixing this and not just treating it.  Objective: Vital signs are stable she is alert and oriented 3 pulses are palpable bilateral. She has pain on palpation medial calcaneal tubercle of the left heel. No erythema edema cellulitis drainage or odor. No calf pain.  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  Plan: She was scanned today for set of orthotics. I will follow up with her once those come in.

## 2015-03-22 ENCOUNTER — Ambulatory Visit: Payer: Medicaid Other | Admitting: *Deleted

## 2015-03-22 DIAGNOSIS — M722 Plantar fascial fibromatosis: Secondary | ICD-10-CM

## 2015-03-22 NOTE — Progress Notes (Signed)
Patient ID: Angie Cain, female   DOB: 1956/07/12, 59 y.o.   MRN: [id number] PICKING UP INSERTS

## 2015-03-22 NOTE — Patient Instructions (Signed)

## 2015-04-06 ENCOUNTER — Other Ambulatory Visit: Payer: Self-pay | Admitting: Internal Medicine

## 2015-04-06 DIAGNOSIS — E1165 Type 2 diabetes mellitus with hyperglycemia: Secondary | ICD-10-CM

## 2015-04-06 DIAGNOSIS — Z794 Long term (current) use of insulin: Secondary | ICD-10-CM

## 2015-04-06 DIAGNOSIS — IMO0002 Reserved for concepts with insufficient information to code with codable children: Secondary | ICD-10-CM

## 2015-04-08 ENCOUNTER — Other Ambulatory Visit: Payer: Self-pay | Admitting: *Deleted

## 2015-04-09 MED ORDER — INSULIN GLARGINE 100 UNIT/ML ~~LOC~~ SOLN: 15.0000 [IU] | Freq: Every day | SUBCUTANEOUS | Status: DC

## 2015-04-09 MED ORDER — METFORMIN HCL ER 750 MG PO TB24: 1500.0000 mg | ORAL_TABLET | Freq: Every day | ORAL | Status: DC

## 2015-04-09 NOTE — Telephone Encounter (Signed)
As this patient was seen by me on 01/25/15 and deemed necessary for their treatment as documented in the EHR, I will refill this prescription for Lantus and metformin XR.

## 2015-04-28 ENCOUNTER — Other Ambulatory Visit: Payer: Self-pay | Admitting: Internal Medicine

## 2015-04-29 NOTE — Telephone Encounter (Signed)
As this patient was seen by me on 02/05/15 and deemed necessary for their treatment as documented in the EHR, I will refill this prescription for Prilosec

## 2015-05-23 ENCOUNTER — Telehealth: Payer: Self-pay | Admitting: Internal Medicine

## 2015-05-23 NOTE — Telephone Encounter (Signed)
Call to patient to confirm appointment for 05/24/15 at 3:45 lmtcb

## 2015-05-24 ENCOUNTER — Encounter: Payer: Self-pay | Admitting: Internal Medicine

## 2015-05-24 ENCOUNTER — Ambulatory Visit (INDEPENDENT_AMBULATORY_CARE_PROVIDER_SITE_OTHER): Payer: Medicaid Other | Admitting: Internal Medicine

## 2015-05-24 VITALS — BP 149/76 | HR 62 | Temp 98.0°F | Wt 142.8 lb

## 2015-05-24 DIAGNOSIS — Z794 Long term (current) use of insulin: Secondary | ICD-10-CM | POA: Diagnosis not present

## 2015-05-24 DIAGNOSIS — E1136 Type 2 diabetes mellitus with diabetic cataract: Secondary | ICD-10-CM

## 2015-05-24 DIAGNOSIS — E1165 Type 2 diabetes mellitus with hyperglycemia: Secondary | ICD-10-CM | POA: Diagnosis present

## 2015-05-24 DIAGNOSIS — K219 Gastro-esophageal reflux disease without esophagitis: Secondary | ICD-10-CM | POA: Diagnosis not present

## 2015-05-24 DIAGNOSIS — M8580 Other specified disorders of bone density and structure, unspecified site: Secondary | ICD-10-CM

## 2015-05-24 DIAGNOSIS — IMO0002 Reserved for concepts with insufficient information to code with codable children: Secondary | ICD-10-CM

## 2015-05-24 DIAGNOSIS — J302 Other seasonal allergic rhinitis: Secondary | ICD-10-CM

## 2015-05-24 LAB — GLUCOSE, CAPILLARY: GLUCOSE-CAPILLARY: 193 mg/dL — AB (ref 65–99)

## 2015-05-24 LAB — POCT GLYCOSYLATED HEMOGLOBIN (HGB A1C): Hemoglobin A1C: 9.7

## 2015-05-24 MED ORDER — GLUCOSE BLOOD VI STRP: ORAL_STRIP | Status: DC

## 2015-05-24 MED ORDER — OMEPRAZOLE 20 MG PO CPDR: DELAYED_RELEASE_CAPSULE | ORAL | Status: DC

## 2015-05-24 MED ORDER — ACCU-CHEK SOFTCLIX LANCETS MISC: Status: DC

## 2015-05-24 MED ORDER — CALCIUM CARBONATE-VITAMIN D 500-200 MG-UNIT PO TABS: 1.0000 | ORAL_TABLET | Freq: Two times a day (BID) | ORAL | Status: DC

## 2015-05-24 MED ORDER — PRAVASTATIN SODIUM 40 MG PO TABS: 40.0000 mg | ORAL_TABLET | Freq: Every evening | ORAL | Status: DC

## 2015-05-24 MED ORDER — METFORMIN HCL 1000 MG PO TABS: 1000.0000 mg | ORAL_TABLET | Freq: Two times a day (BID) | ORAL | Status: DC

## 2015-05-24 NOTE — Assessment & Plan Note (Addendum)
Lab Results  Component Value Date   HGBA1C 9.7 05/24/2015   HGBA1C 8.2 01/25/2015   HGBA1C 8.4 09/28/2014     Assessment: Diabetes control: poor control (HgbA1C >9%) Progress toward A1C goal:  deteriorated Comments: She continues to report that stress is a complicating factor to why she does not have regular glucose checks. Upon reviewing A1c with her at this visit, she was shocked and resolved that she would do better with taking regular measurements. She reports that she still struggles with her diet, drinking wine coolers and consuming sweets when stressed. She also reports that she is trying a new drink which consists of tumor, ginger, coconut oil, lemon water which she is using to attempt to curb her temptations for sweets.  Plan: Medications:  Lantus 15 units, metformin extended release 750 mg twice daily Home glucose monitoring: Frequency: once a day Timing: N/A Instruction/counseling given: reminded to bring blood glucose meter & log to each visit, reminded to bring medications to each visit and discussed diet Educational resources provided:   Self management tools provided: copy of home glucose meter download Other plans: Plan to follow-up in 2 weeks with log of blood sugars that will be checked at the time of fasting that was provided a book in the event that she forgot so that we can accurately determine which sugars correspond to her fasting state -Consider adding nateglinide, preferred agent for [facility] as she struggles with regular CBGs and dosing Lantus, with concomitant worsening of her diabetes at the time of follow-up -Refilled lancets and test strips -Check urine microalbumin/creatinine ratio  ADDENDUM 05/28/2015  4:22 PM:  Urine microalbumin/creatinine ratio reassuring for no nephropathy.

## 2015-05-24 NOTE — Assessment & Plan Note (Signed)
She reported that she never received a phone call to schedule her DEXA scan, so we will attempt to reschedule this accordingly. We'll also refill her calcium supplement.

## 2015-05-24 NOTE — Assessment & Plan Note (Signed)
Refilled Prilosec

## 2015-05-24 NOTE — Patient Instructions (Signed)
General Instructions:   Please bring your medicines with you each time you come to clinic.  Medicines may include prescription medications, over-the-counter medications, herbal remedies, eye drops, vitamins, or other pills.   Progress Toward Treatment Goals:  Treatment Goal 01/25/2015  Hemoglobin A1C unchanged  Blood pressure at goal  Stop smoking smoking the same amount    Self Care Goals & Plans:  Self Care Goal 01/25/2015  Manage my medications take my medicines as prescribed; bring my medications to every visit  Monitor my health keep track of my blood glucose; bring my glucose meter and log to each visit  Eat healthy foods drink diet soda or water instead of juice or soda; eat foods that are low in salt; eat baked foods instead of fried foods  Be physically active find an activity I enjoy  Stop smoking go to the [employer] (https://scott-booker.info/); cut down the number of cigarettes smoked; call [facility] (1-800-QUIT-NOW)    Home Blood Glucose Monitoring 01/25/2015  Check my blood sugar 2 times a day  When to check my blood sugar before breakfast; after dinner     Care Management & Community Referrals:  Referral 01/25/2015  Referrals made for care management support none needed  Referrals made to community resources -

## 2015-05-24 NOTE — Progress Notes (Signed)
   Subjective:    Patient ID: Angie Cain, female    DOB: Apr 21, 1956, 59 y.o.   MRN: [id number]  HPI Angie Cain is a 59 year old female with DM2, HTN, GERD, PVD who presents today for a routine office visit. Please see assessment & plan for documentation of each problem.   Review of Systems  Respiratory: Negative for shortness of breath.   Cardiovascular: Negative for chest pain and leg swelling.  Gastrointestinal: Negative for nausea, vomiting, abdominal pain, diarrhea and blood in stool.  Genitourinary: Negative for dysuria.  Neurological: Negative for dizziness.       Objective:   Physical Exam Constitutional: She is oriented to person, place, and time. She appears well-developed and well-nourished. No distress.  HENT:  Head: Normocephalic and atraumatic.  Eyes: Conjunctivae are normal. Pupils are equal, round, and reactive to light.  Minimal area of swelling noted overlying left upper eyelid though is not erythematous or with drainage. Nontender. Cardiovascular: Normal rate, regular rhythm and normal heart sounds.  Exam reveals no gallop and no friction rub.   No murmur heard. Pulmonary/Chest: Effort normal. No respiratory distress. She has no wheezes. She has no rales.  Abdominal: Soft. Bowel sounds are normal. She exhibits no distension. There is no tenderness.  Neurological: She is alert and oriented to person, place, and time. No cranial nerve deficit. Coordination normal.  Skin: Skin is warm and dry. She is not diaphoretic.  Psychiatric: Her behavior is normal.         Assessment & Plan:

## 2015-05-24 NOTE — Assessment & Plan Note (Signed)
Loraditine

## 2015-05-25 LAB — MICROALBUMIN / CREATININE URINE RATIO
Creatinine, Urine: 133.8 mg/dL
MICROALB UR: 1.5 mg/dL (ref <2.0)
Microalb Creat Ratio: 11.2 mg/g (ref 0.0–30.0)

## 2015-05-27 NOTE — Progress Notes (Signed)
[facility] Medicine Clinic Attending  Case discussed with Dr. Patel at the time of the visit.  We reviewed the resident's history and exam and pertinent patient test results.  I agree with the assessment, diagnosis, and plan of care documented in the resident's note.  

## 2015-06-13 ENCOUNTER — Telehealth: Payer: Self-pay | Admitting: Internal Medicine

## 2015-06-13 NOTE — Telephone Encounter (Signed)
Call to patient to confirm appointment for 06/14/15 at 3:15 lmtcb

## 2015-06-14 ENCOUNTER — Encounter: Payer: Self-pay | Admitting: Internal Medicine

## 2015-06-14 ENCOUNTER — Ambulatory Visit (INDEPENDENT_AMBULATORY_CARE_PROVIDER_SITE_OTHER): Payer: Medicaid Other | Admitting: Internal Medicine

## 2015-06-14 VITALS — BP 155/75 | HR 63 | Temp 98.2°F | Ht 63.0 in | Wt 145.5 lb

## 2015-06-14 DIAGNOSIS — I499 Cardiac arrhythmia, unspecified: Secondary | ICD-10-CM

## 2015-06-14 DIAGNOSIS — Z23 Encounter for immunization: Secondary | ICD-10-CM

## 2015-06-14 DIAGNOSIS — Z794 Long term (current) use of insulin: Secondary | ICD-10-CM | POA: Diagnosis not present

## 2015-06-14 DIAGNOSIS — E1165 Type 2 diabetes mellitus with hyperglycemia: Secondary | ICD-10-CM | POA: Diagnosis not present

## 2015-06-14 DIAGNOSIS — IMO0002 Reserved for concepts with insufficient information to code with codable children: Secondary | ICD-10-CM

## 2015-06-14 DIAGNOSIS — Z79899 Other long term (current) drug therapy: Secondary | ICD-10-CM | POA: Diagnosis not present

## 2015-06-14 DIAGNOSIS — I491 Atrial premature depolarization: Secondary | ICD-10-CM | POA: Insufficient documentation

## 2015-06-14 DIAGNOSIS — I10 Essential (primary) hypertension: Secondary | ICD-10-CM | POA: Diagnosis not present

## 2015-06-14 LAB — GLUCOSE, CAPILLARY: GLUCOSE-CAPILLARY: 214 mg/dL — AB (ref 65–99)

## 2015-06-14 NOTE — Assessment & Plan Note (Addendum)
BP Readings from Last 3 Encounters:  06/14/15 155/75  05/24/15 149/76  01/25/15 136/66    Lab Results  Component Value Date   NA 138 08/24/2014   K 4.3 08/24/2014   CREATININE 0.69 08/24/2014    Assessment: Blood pressure control: mildly elevated Progress toward BP goal:  unchanged Comments: Elevated today though not appropriate to treat off 1 value.  Plan: Medications:  Lisinopril/HCTZ 20/25 mg Educational resources provided:   Self management tools provided:   Other plans: Continue current therapy and will reassess blood pressure at next visit. If it continues to remain high, may consider increasing her dose of lisinopril.

## 2015-06-14 NOTE — Assessment & Plan Note (Signed)
Lab Results  Component Value Date   HGBA1C 9.7 05/24/2015   HGBA1C 8.2 01/25/2015   HGBA1C 8.4 09/28/2014     Assessment: Diabetes control: poor control (HgbA1C >9%) Progress toward A1C goal:  unable to assess Comments: Since her last visit, she did bring her glucometer and has been checking her sugars at least once per our agreement. Her first blood sugar the day tends to vary greatly with occasional lows [80s 2] and occasional highs [214, 249, 203, 239, 202]. She does admit that last weekend, she went out of town with some family members and ate at restaurants and struggles with maintaining her diabetes. She feels she needs to go see Ms. Butch Penny Plyler for better nutrition. She does report that she knows when she is hypoglycemic [tired, diaphoretic] as well as when she is hyperglycemic [polyuria]. She reports adherence to metformin though struggles with taking her Lantus regularly. We discussed the possibility of starting Starlix today though she is not agreeable and feels that she can continue working on her glycemic control until her Lantus bile runs out.  Plan: Medications:  Lantus 15 units, metformin 1000 mg twice daily Home glucose monitoring: Frequency: once a day Timing: before breakfast Instruction/counseling given: reminded to bring blood glucose meter & log to each visit and discussed diet Educational resources provided:   Self management tools provided:   Other plans:  -Refer to diabetic educator -Encouraged her to continue checking her sugars daily -Gave her a food log and to note down foods she ate prior and following any abnormally high blood sugars -Plan to follow-up in 4 weeks

## 2015-06-14 NOTE — Progress Notes (Signed)
   Subjective:    Patient ID: Angie Cain, female    DOB: [birth year], 59 y.o.   MRN: [id number]  HPI [name] is a 59 year old female with DM2, HTN, GERD, PVD who presents today for a routine office visit. Please see assessment & plan for documentation of each problem.    Review of Systems  Respiratory: Negative for shortness of breath.   Cardiovascular: Negative for chest pain and leg swelling.  Gastrointestinal: Negative for nausea, vomiting, abdominal pain, diarrhea and blood in stool.  Genitourinary: Negative for dysuria.  Neurological: Positive for dizziness (hyperglycemic).       Objective:   Physical Exam  Constitutional: She is oriented to person, place, and time. She appears well-developed and well-nourished. No distress.  HENT:  Head: Normocephalic and atraumatic.  Eyes: Conjunctivae are normal. Pupils are equal, round, and reactive to light.  Cardiovascular: Abnormal heart sounds [S1, S2, third heartbeat?], Does not appear to follow her radial pulse. Pulmonary/Chest: Effort normal. No respiratory distress. She has no wheezes. She has no rales.  Abdominal: Soft. Bowel sounds are normal. She exhibits no distension. There is no tenderness.  Neurological: She is alert and oriented to person, place, and time. No cranial nerve deficit. Coordination normal.  Skin: Skin is warm and dry. She is not diaphoretic.  Psychiatric: Her behavior is normal.        Assessment & Plan:

## 2015-06-14 NOTE — Patient Instructions (Signed)
Here's a summary of what we did today:  -Revisit your blood sugars which were checked regularly [keep it up!] -Acknowledge the struggle of dealing with diabetes -See Ms. Butch Penny for better food counseling  Look forward to seeing you back in 4 weeks to see where we stand.  Stay positive!

## 2015-06-14 NOTE — Assessment & Plan Note (Signed)
She last received this vaccine in 2005 and was agreeable to receiving it again today.

## 2015-06-14 NOTE — Assessment & Plan Note (Signed)
Physical exam findings notable for what appeared to be an extra heartbeat following S2. S1 appeared to be intact as first heart sound correlated with transmission of pulse when palpated at the radial artery. Ordered EKG which was notable for low voltage activity, and rhythm strip appeared to show abnormal conduction almost every other beat in lead 2 with heart rate 55. She denies any chest pain, dizziness at the current time or any prior cardiac issues or family history of cardiac disease. I will discuss this rhythm strip with a cardiologist informally and revisit it at her follow-up appointment.

## 2015-06-18 NOTE — Progress Notes (Signed)
Internal Medicine Clinic Attending  Case discussed with Dr. Patel,[name] at the time of the visit.  We reviewed the resident's history and exam and pertinent patient test results.  I agree with the assessment, diagnosis, and plan of care documented in the resident's note.  

## 2015-06-27 ENCOUNTER — Telehealth: Payer: Self-pay | Admitting: Dietician

## 2015-06-27 NOTE — Telephone Encounter (Addendum)
Call to patient to confirm appointment for 06/28/15 at 2:30 lmtcb

## 2015-06-28 ENCOUNTER — Other Ambulatory Visit: Payer: Self-pay | Admitting: Internal Medicine

## 2015-06-28 ENCOUNTER — Encounter: Payer: Self-pay | Admitting: Dietician

## 2015-06-28 ENCOUNTER — Ambulatory Visit (INDEPENDENT_AMBULATORY_CARE_PROVIDER_SITE_OTHER): Payer: Medicaid Other | Admitting: Dietician

## 2015-06-28 VITALS — Wt 143.5 lb

## 2015-06-28 DIAGNOSIS — Z794 Long term (current) use of insulin: Secondary | ICD-10-CM | POA: Diagnosis not present

## 2015-06-28 DIAGNOSIS — E1165 Type 2 diabetes mellitus with hyperglycemia: Secondary | ICD-10-CM

## 2015-06-28 DIAGNOSIS — Z713 Dietary counseling and surveillance: Secondary | ICD-10-CM | POA: Diagnosis not present

## 2015-06-28 DIAGNOSIS — E119 Type 2 diabetes mellitus without complications: Secondary | ICD-10-CM

## 2015-06-28 DIAGNOSIS — IMO0002 Reserved for concepts with insufficient information to code with codable children: Secondary | ICD-10-CM

## 2015-06-28 LAB — GLUCOSE, CAPILLARY: GLUCOSE-CAPILLARY: 195 mg/dL — AB (ref 65–99)

## 2015-06-28 NOTE — Progress Notes (Signed)
Diabetes Self-Management Education  Visit Type:  Follow-up  Appt. Start Time: 1435 Appt. End Time: 1600 Last visit was 08/24/14  patient did not keep food records, but has decreased portions and tried to limits starches and sweets since seeing the doctor.She needs minimal assistance with identifying foods that incease her blood sugar and portion sizes.  She has had diabetes over 20 years and was diagnosed with a blood sugar of 1600 and coma. Expect patient needs prandial coverage at this point. Her weight is table at a healthy level. She reports strong family history of diabetes and diabetes complications Her calculated TDD is ~33 units of insulin. Therefore 15-16 units of basal insulin is likely about right for her weight.   Blood sugar appear to be improving from what they were with past 60 days average of 178 with last 30 days 55 within range ( target is 75% in range) She says she is taking metformin as prescribed and also said something about having a hard time "fitting metformin in" and it gives her loose stools.   06/28/2015  Angie Cain, identified by name and date of birth, is a 59 y.o. female with a diagnosis of Diabetes:  .Type 2      ASSESSMENT  Weight 143 lb 8 oz (65.091 kg). Body mass index is 25.43 kg/(m^2).    Subsequent Visit Information:  Since your last visit, have you continued or began the use of a meal plan?: Yes How many days a week are you following a meal plan?: 7 Since your last visit, have you continued or began to exercise on a consistent basis?: No Since your last visit have you continued or begun to take your medications as prescribed?: Yes Since your last visit have you had your blood pressure checked?: No Since your last visit are you checking your feet?: Yes How many days per week are you checking your feet?: 7 Since your last visit have you experienced any weight changes?: No change Since your last visit, are you checking your blood glucose at  least once a day?: Yes  Psychosocial:     Patient Belief/Attitude about Diabetes: Motivated to manage diabetes Self-care barriers: Other (comment) (reports spouse is not supportive, competing priorities) Self-management support: [place] office, CDE visits Patient Concerns: Glycemic Control Special Needs: None Preferred Learning Style: Auditory, Visual, Hands on Learning Readiness: Ready  Complications:   How often do you check your blood sugar?: 1-2 times/day Fasting Blood glucose range (mg/dL): 130-179, 180-200 Postprandial Blood glucose range (mg/dL): 130-179, 180-200, >200 Number of hypoglycemic episodes per month:  (0) Number of hyperglycemic episodes per week: 24 Can you tell when your blood sugar is high?: Yes What do you do if your blood sugar is high?: eats less at next meal or goes to bed Have you had a dilated eye exam in the past 12 months?: Yes  Diet Intake:  Breakfast: cheerios, 1% milk and banana Lunch:  (chicken stuffing, with corn and steamed carrots, ginger ale) Snack (afternoon): chips Dinner: spaghetti, garlic bread or chicken with peppers and rice of which she eats small amount, beans Snack (evening): ice cream Beverage(s): tea with lemon and ginger, kolaid with sugar, limited amounts of regular soda  Exercise:  Exercise: ADL's, Light (walking / raking leaves) Light Exercise amount of time (min / week): 60   Individualized Plan for Diabetes Self-Management Training:   Learning Objective:  Patient will have a greater understanding of diabetes self-management.  Patient education plan per assessed needs and concerns  is to attend individual sessions for  contued support for meal planning, added exercise, smoking cessation to reduce risk and dental referral  Education Topics Reviewed with Patient Today:  Explored patient's options for treatment of their diabetes Food label reading, portion sizes and measuring food. Role of exercise on diabetes  management, blood pressure control and cardiac health. Smoking cessation- not assessed today Dental referral requested      PATIENTS GOALS/Plan (Developed by the patient):  Nutrition: Follow meal plan discussed (carb consistent diet) Physical Activity: Exercise 1-2 times per week  Patient Self Evaluation of Goals - Patient rates self as meeting previously set goals:   Nutrition: 50 - 75 % (did not hear from our office with an appointment to dentist) Reducing Risk: < 25%   Plan:  To help lower her blood suar patient agrees to:   Patient to try to exercise at least one time in next 2 weeks   Patient to limit portions of sweets and starches and eat more vegetables and healthy fats.   Expected Outcomes:  Demonstrated interest in learning. Expect positive outcomes Education material provided: Carbohydrate counting sheet If problems or questions, patient to contact team via:  Phone Future DSME appointment: - 2 wks

## 2015-07-05 ENCOUNTER — Other Ambulatory Visit: Payer: Self-pay | Admitting: *Deleted

## 2015-07-05 DIAGNOSIS — I10 Essential (primary) hypertension: Secondary | ICD-10-CM

## 2015-07-05 MED ORDER — LISINOPRIL-HYDROCHLOROTHIAZIDE 20-25 MG PO TABS: 1.0000 | ORAL_TABLET | Freq: Every day | ORAL | Status: DC

## 2015-07-05 NOTE — Telephone Encounter (Signed)
As this patient was seen by me on 06/14/15 and deemed necessary for their treatment as documented in the EHR, I will refill this prescription for lisinopril/HCTZ 20/25 mg though for 30 day supply as we are going to reassess her blood pressure at her next visit to decide if she needs to increase doses

## 2015-07-12 ENCOUNTER — Ambulatory Visit (INDEPENDENT_AMBULATORY_CARE_PROVIDER_SITE_OTHER): Payer: Medicaid Other | Admitting: Dietician

## 2015-07-12 ENCOUNTER — Encounter: Payer: Self-pay | Admitting: Internal Medicine

## 2015-07-12 ENCOUNTER — Ambulatory Visit (INDEPENDENT_AMBULATORY_CARE_PROVIDER_SITE_OTHER): Payer: Medicaid Other | Admitting: Internal Medicine

## 2015-07-12 VITALS — BP 123/67 | HR 77 | Temp 98.0°F | Ht 63.0 in | Wt 146.2 lb

## 2015-07-12 DIAGNOSIS — I10 Essential (primary) hypertension: Secondary | ICD-10-CM | POA: Diagnosis not present

## 2015-07-12 DIAGNOSIS — IMO0002 Reserved for concepts with insufficient information to code with codable children: Secondary | ICD-10-CM

## 2015-07-12 DIAGNOSIS — E1165 Type 2 diabetes mellitus with hyperglycemia: Secondary | ICD-10-CM

## 2015-07-12 DIAGNOSIS — Z79899 Other long term (current) drug therapy: Secondary | ICD-10-CM

## 2015-07-12 DIAGNOSIS — Z794 Long term (current) use of insulin: Secondary | ICD-10-CM

## 2015-07-12 DIAGNOSIS — M8580 Other specified disorders of bone density and structure, unspecified site: Secondary | ICD-10-CM | POA: Diagnosis not present

## 2015-07-12 DIAGNOSIS — E119 Type 2 diabetes mellitus without complications: Secondary | ICD-10-CM

## 2015-07-12 DIAGNOSIS — I491 Atrial premature depolarization: Secondary | ICD-10-CM

## 2015-07-12 DIAGNOSIS — F17210 Nicotine dependence, cigarettes, uncomplicated: Secondary | ICD-10-CM | POA: Diagnosis not present

## 2015-07-12 DIAGNOSIS — Z1231 Encounter for screening mammogram for malignant neoplasm of breast: Secondary | ICD-10-CM

## 2015-07-12 DIAGNOSIS — Z713 Dietary counseling and surveillance: Secondary | ICD-10-CM | POA: Diagnosis not present

## 2015-07-12 DIAGNOSIS — F17200 Nicotine dependence, unspecified, uncomplicated: Secondary | ICD-10-CM

## 2015-07-12 LAB — GLUCOSE, CAPILLARY: GLUCOSE-CAPILLARY: 161 mg/dL — AB (ref 65–99)

## 2015-07-12 MED ORDER — LISINOPRIL-HYDROCHLOROTHIAZIDE 20-25 MG PO TABS: 1.0000 | ORAL_TABLET | Freq: Every day | ORAL | Status: DC

## 2015-07-12 NOTE — Progress Notes (Signed)
   Subjective:    Patient ID: Angie Cain, female    DOB: 1956/04/09, 59 y.o.   MRN: [id number]  HPI Angie Cain is a 59 year old female with DM2, HTN, GERD, PVD who presents today for a routine office visit. Please see assessment & plan for documentation of each problem.    Review of Systems  Respiratory: Negative for shortness of breath.   Cardiovascular: Negative for chest pain and leg swelling.  Gastrointestinal: Negative for nausea, vomiting, abdominal pain, diarrhea and blood in stool.  Genitourinary: Negative for dysuria.  Neurological: Negative for dizziness.       Objective:   Physical Exam Constitutional: Middle-aged African-American female. No distress.  HENT:  Head: Normocephalic and atraumatic.  Eyes: Conjunctivae are normal. Pupils are 4 mm, direct, consensual, near.  Cardiovascular: Normal rate, irregular rhythm though stable from prior exam. Pulmonary/Chest: Effort normal. No respiratory distress. He has no wheezes. He has no rales.  Abdominal: Soft. Bowel sounds are normal. He exhibits no distension. There is no tenderness.  Neurological: He is alert and oriented to person, place, and time. Coordination normal.  Skin: Skin is warm and dry. He is not diaphoretic.  Psychiatric: His behavior is normal.          Assessment & Plan:

## 2015-07-12 NOTE — Progress Notes (Signed)
Diabetes Self-Management Education  Visit Type:  Follow-up   Appt. Start Time: 1435 Appt. End Time: 1600 Last visit was 08/24/14 She needs minimal assistance with identifying foods that incease her blood sugar and portion sizes.  She reports she is now reading labels for carb and trying to keep them limited and consistent. Her calculated TDD is ~33 units of insulin. Therefore 15-16 units of basal insulin is likely about max for her weight.   Blood sugar: ays average of 178 with last 30 days 55 within range ( target is 75% in range) She says she is taking metformin as prescribed and would   like to stay on despite it giving her loose stools.   07/12/2015  Angie Cain, identified by name and date of birth, is a 59 y.o. female with a diagnosis of Diabetes:  .Type 2      ASSESSMENT  weight 146#  BMI.~ 25    Subsequent Visit Information:  Since your last visit, have you continued or began the use of a meal plan?: Yes Since your last visit, have you continued or began to exercise on a consistent basis?: Yes How many days per week are you exercising or participating in a physicial activity for more than 20 minutes?: 1 Since your last visit have you continued or begun to take your medications as prescribed?: Yes Since your last visit are you checking your feet?: Yes Since your last visit have you experienced any weight changes?: Gain Weight Gain (lbs): 3 Since your last visit, are you checking your blood glucose at least once a day?: Yes  Psychosocial:   Patient Belief/Attitude about Diabetes: Motivated to manage diabetes Self-management support: [place] office, Family, CDE visits Patient Concerns: Glycemic Control, Support Special Needs: None Preferred Learning Style: Auditory Learning Readiness: Change in progress  Complications:   How often do you check your blood sugar?: 1-2 times/day Fasting Blood glucose range (mg/dL): 70-129, 130-179 Postprandial Blood glucose range  (mg/dL): 130-179, 180-200 Number of hypoglycemic episodes per month: 0 Number of hyperglycemic episodes per week: 0 Can you tell when your blood sugar is high?: Yes What do you do if your blood sugar is high?:  (eats less carbs) Have you had a dental exam in the past 12 months?: No  Diet Intake:  Beverage(s): switched form red wine to white wine  Exercise:  Exercise: Light (walking / raking leaves) Light Exercise amount of time (min / week): 20   Individualized Plan for Diabetes Self-Management Training:   Learning Objective:  Patient will have a greater understanding of diabetes self-management.  Patient education plan per assessed needs and concerns is to attend individual sessions for  contued support for meal planning, added exercise, monitoring, acute complication, smoking cessation to reduce risk and dental referral  Education Topics Reviewed with Patient Today:       Smoking cessation- assessed today. Patient says this is a priority  Dental referral requested      PATIENTS GOALS/Plan (Developed by the patient):  Nutrition: Follow meal plan discussed Physical Activity: Exercise 3-5 times per week, 30 minutes per day stop smoking   Patient Self Evaluation of Goals - Patient rates self as meeting previously set goals:   Nutrition: >75% Physical Activity: >75% Reducing Risk: < 25%   Plan:  To help lower her blood suar patient agrees to:   Patient to try to exercise at least one time in next 2 weeks   Patient to limit portions of sweets and starches and eat more  vegetables and healthy fats.   Expected Outcomes:  Demonstrated interest in learning. Expect positive outcomes Education material provided:  Neurosurgeon, information about medicines for diabetes If problems or questions, patient to contact team via:  Phone Future DSME appointment: - 4-6 wks

## 2015-07-12 NOTE — Patient Instructions (Signed)
You are doing great with walking  You are doing great counting carbs and making healthier choices!  And your blood sugars are better also!    Please make a follow up in 1 month.  Your New goal is to eat more healther fats  Walk more often than the 1-2 times a week. your goal is  To walk 1-2 times a day on 4 days of the week.

## 2015-07-12 NOTE — Patient Instructions (Addendum)
Please keep up walking and working on cutting down smoking. We will see you back in 4 weeks.   Please get your mammogram before your next visit.

## 2015-07-14 NOTE — Assessment & Plan Note (Addendum)
I reviewed her EKG strip with Dr. Aundra Dubin interpreted her rhythm as premature atrial complexes given the varying P-wave morphologies. We'll convey this at her next visit.

## 2015-07-14 NOTE — Assessment & Plan Note (Signed)
BP Readings from Last 3 Encounters:  07/12/15 123/67  06/14/15 155/75  05/24/15 149/76    Lab Results  Component Value Date   NA 138 08/24/2014   K 4.3 08/24/2014   CREATININE 0.69 08/24/2014    Assessment: Blood pressure control: controlled Progress toward BP goal:  at goal Comments: Stable on current regimen  Plan: Medications:  Lisinopril/HCTZ 20/25 mg Educational resources provided:   Self management tools provided:   Other plans: Continue current therapy

## 2015-07-14 NOTE — Assessment & Plan Note (Signed)
Patient did not get her scan done so we'll attempt to schedule at the same hospital where she is due for mammography.

## 2015-07-14 NOTE — Assessment & Plan Note (Signed)
Lab Results  Component Value Date   HGBA1C 9.7 05/24/2015   HGBA1C 8.2 01/25/2015   HGBA1C 8.4 09/28/2014     Assessment: Diabetes control: fair control Progress toward A1C goal:  unchanged Comments: Prior to her visit with me, she was seen by Ms. Butch Penny Plyler who recommended to both me and her that we consider a prandial agent, like Prandin. However, patient is not agreeable to changing her current therapy and would like to continue working on where she is at. She did bring her glucometer with her, and readings are notable for mid 100s to mid 200s. It is difficult to discern which of the sugars or her fasting levels as she has an irregular sleep cycle. She also has several values below 100. She reports that she is taking her Lantus 15 units at bedtime and is also working on cutting down on how much she smokes by avoiding individuals with whom she associates with who tend to offer her cigarettes. She is also picked up walking after she purchased new shoes and is committed to spending at least 15 minutes a day going up and down the stairs in her apartment complex.  Plan: Medications:  Lantus 15 units at bedtime, metformin XR 1000 mg twice daily Home glucose monitoring: Frequency: once a day Timing:   Instruction/counseling given: reminded to bring blood glucose meter & log to each visit Educational resources provided: other (see comments) (Saw Diabetes Educator tomorrow) Self management tools provided: copy of home glucose meter download Other plans: I explained to her that while her sugars are not the best, we have other options and will need to decide when to consider changing therapy. She is optimistic that her A1c will be better at the next 3 months checkpoint and declines changing her insulin regimen.

## 2015-07-14 NOTE — Assessment & Plan Note (Signed)
  Assessment: Progress toward smoking cessation:  smoking the same amount Barriers to progress toward smoking cessation:  other tobacco users at home, stress Comments: She reports smoking 4-6 cigarettes a month though tells me that is about the same amount. She is however working on Pharmacist, community with whom she knows she will likely smoke cigarettes.  Plan: Instruction/counseling given:  I commended patient for quitting and reviewed strategies for preventing relapses. Educational resources provided:  [employer] account manager) brochure (denies) Self management tools provided:    Medications to assist with smoking cessation:  None Patient agreed to the following self-care plans for smoking cessation: cut down the number of cigarettes smoked  Other plans: I explained to her the importance of smoking cessation especially with regards for glycemic control. She resolves that she will continue to cut down on how much she smokes.

## 2015-07-14 NOTE — Assessment & Plan Note (Signed)
She is agreeable to getting her mammogram today's, so we will refer her accordingly.

## 2015-07-18 NOTE — Progress Notes (Signed)
[facility] Medicine Clinic Attending  Case discussed with Dr. Patel,[name] soon after the resident saw the patient.  We reviewed the resident's history and exam and pertinent patient test results.  I agree with the assessment, diagnosis, and plan of care documented in the resident's note. 

## 2015-07-19 ENCOUNTER — Encounter: Payer: Medicaid Other | Admitting: Dietician

## 2015-07-19 ENCOUNTER — Encounter: Payer: Medicaid Other | Admitting: Internal Medicine

## 2015-08-09 ENCOUNTER — Ambulatory Visit (INDEPENDENT_AMBULATORY_CARE_PROVIDER_SITE_OTHER): Payer: Medicaid Other | Admitting: Internal Medicine

## 2015-08-09 ENCOUNTER — Encounter: Payer: Self-pay | Admitting: Internal Medicine

## 2015-08-09 VITALS — BP 124/67 | HR 65 | Temp 98.2°F | Ht 63.0 in | Wt 143.5 lb

## 2015-08-09 DIAGNOSIS — E1165 Type 2 diabetes mellitus with hyperglycemia: Secondary | ICD-10-CM

## 2015-08-09 DIAGNOSIS — Z79899 Other long term (current) drug therapy: Secondary | ICD-10-CM

## 2015-08-09 DIAGNOSIS — Z794 Long term (current) use of insulin: Secondary | ICD-10-CM

## 2015-08-09 DIAGNOSIS — IMO0002 Reserved for concepts with insufficient information to code with codable children: Secondary | ICD-10-CM

## 2015-08-09 LAB — GLUCOSE, CAPILLARY: Glucose-Capillary: 237 mg/dL — ABNORMAL HIGH (ref 65–99)

## 2015-08-09 NOTE — Assessment & Plan Note (Signed)
Lab Results  Component Value Date   HGBA1C 9.7 05/24/2015   HGBA1C 8.2 01/25/2015   HGBA1C 8.4 09/28/2014     Assessment: Diabetes control: poor control (HgbA1C >9%) Progress toward A1C goal:  unable to assess Comments: She has brought with her her glucometer, and review of her readings noted that the trend initially in the low 100s earlier in the month but then after 8/14 range mostly in the upper 100s to the 200s. Her last value from earlier this morning was 313, and she acknowledges that she was binge eating last night. She also reports there is a lot of stress at home and has been drinking alcohol as well. She also reports that it is difficult for her to take her metformin daily as well as her Lantus given that her meal is so irregular. When reminded of her goal walking 1-2 times a day 4 days a week that she set with the diabetic educator, she thought it was actually 2 days a week though reports that she is still trying despite the heat outside.  Plan: Medications:  Lantus 16 units at bedtime, metformin 1000 mg twice daily Home glucose monitoring: Frequency: 2 times a day Timing: before breakfast Instruction/counseling given: reminded to bring blood glucose meter & log to each visit and discussed diet Educational resources provided:   Self management tools provided: copy of home glucose meter download Other plans:  -Advised her that she would have her A1c rechecked at her next visit though suspect it will be similar if not worse to what it currently is reading -Also explained that this case scenario would be that we can continue her current plan that would also need to consider a "not so best" scenario in which we would need to alter her insulin regimen to which she expressed reservations but consider the possibility after we discussed manifestations of end organ dysfunction, 2 of which she is already experiencing [neuropathy, retinopathy]

## 2015-08-09 NOTE — Patient Instructions (Signed)
Please continue working on your exercise and taking your insulin and metformin.  Look forward to seeing you back in 4 weeks!

## 2015-08-09 NOTE — Progress Notes (Signed)
   Subjective:    Patient ID: Angie Cain, female    DOB: [birth year], 59 y.o.   MRN: [id number]  HPI Angie Cain is a 59 year old female with DM2, HTN, GERD, PVD who presents today for a routine office visit. Please see assessment & plan for documentation of each problem.    Review of Systems  Respiratory: Negative for shortness of breath.   Cardiovascular: Negative for chest pain and leg swelling.  Gastrointestinal: Negative for nausea, vomiting, abdominal pain, diarrhea and blood in stool.  Genitourinary: Negative for dysuria.  Neurological: Negative for dizziness.       Objective:   Physical Exam  Constitutional: Middle-aged African-American female. No distress.  Head: Normocephalic and atraumatic.  Eyes: Conjunctivae are normal. Pupils are 4 mm, direct, consensual, near.  Cardiovascular: Normal rate, regular rhythm and normal heart sounds.  No gallop, friction rub, murmur heard. Pulmonary/Chest: Effort normal. No respiratory distress. No wheezes, rales.  Abdominal: Soft. Bowel sounds are normal. No distension. No tenderness.  Neurological: Alert and oriented to person, place, and time. Coordination normal.  Skin: Warm and dry. Not diaphoretic.  Psychiatric: Affect appropriate         Assessment & Plan:

## 2015-08-13 NOTE — Progress Notes (Signed)
Internal Medicine Clinic Attending  Case discussed with Dr. Patel,[name] at the time of the visit.  We reviewed the resident's history and exam and pertinent patient test results.  I agree with the assessment, diagnosis, and plan of care documented in the resident's note.  

## 2015-11-05 ENCOUNTER — Encounter: Payer: Self-pay | Admitting: Student

## 2015-11-29 ENCOUNTER — Other Ambulatory Visit: Payer: Self-pay | Admitting: Internal Medicine

## 2015-11-29 NOTE — Telephone Encounter (Signed)
As this patient was seen by me on 08/09/15 and deemed necessary for their treatment as documented in the EHR, I will refill this prescription for Lantus 50 units at bedtime. I will not put any refills on this prescription as she needs to follow-up with me to reassess her diabetes.

## 2015-12-23 NOTE — Addendum Note (Signed)
Addended by: Truddie Crumble on: 12/23/2015 03:54 PM   Modules accepted: Orders

## 2016-01-01 ENCOUNTER — Other Ambulatory Visit: Payer: Self-pay | Admitting: Internal Medicine

## 2016-01-01 NOTE — Telephone Encounter (Signed)
As this patient was seen by me on 08/09/15, I am hesitant to  refill this prescription for Lantus out of interest for cost.  She is due to see me back on 01/03/16, and I suspect we will need to add an additional agent to help her with controlling her sugars better. She struggles with taking insulin therapy consistently.

## 2016-01-01 NOTE — Telephone Encounter (Signed)
Pt has appt 1/20 w/ dr patel

## 2016-01-03 ENCOUNTER — Encounter: Payer: Self-pay | Admitting: Internal Medicine

## 2016-01-03 ENCOUNTER — Encounter (INDEPENDENT_AMBULATORY_CARE_PROVIDER_SITE_OTHER): Payer: Self-pay

## 2016-01-03 ENCOUNTER — Ambulatory Visit (INDEPENDENT_AMBULATORY_CARE_PROVIDER_SITE_OTHER): Payer: Medicaid Other | Admitting: Internal Medicine

## 2016-01-03 VITALS — BP 135/68 | HR 66 | Temp 98.0°F | Ht 63.0 in | Wt 149.1 lb

## 2016-01-03 DIAGNOSIS — IMO0002 Reserved for concepts with insufficient information to code with codable children: Secondary | ICD-10-CM

## 2016-01-03 DIAGNOSIS — E1165 Type 2 diabetes mellitus with hyperglycemia: Secondary | ICD-10-CM

## 2016-01-03 DIAGNOSIS — Z794 Long term (current) use of insulin: Principal | ICD-10-CM

## 2016-01-03 DIAGNOSIS — Z630 Problems in relationship with spouse or partner: Secondary | ICD-10-CM | POA: Diagnosis not present

## 2016-01-03 DIAGNOSIS — Z9114 Patient's other noncompliance with medication regimen: Secondary | ICD-10-CM | POA: Diagnosis not present

## 2016-01-03 LAB — GLUCOSE, CAPILLARY: Glucose-Capillary: 181 mg/dL — ABNORMAL HIGH (ref 65–99)

## 2016-01-03 LAB — POCT GLYCOSYLATED HEMOGLOBIN (HGB A1C): Hemoglobin A1C: 9.3

## 2016-01-03 MED ORDER — METFORMIN HCL ER (MOD) 1000 MG PO TB24: 2000.0000 mg | ORAL_TABLET | Freq: Every day | ORAL | Status: DC

## 2016-01-03 MED ORDER — REPAGLINIDE 1 MG PO TABS: 1.0000 mg | ORAL_TABLET | Freq: Three times a day (TID) | ORAL | Status: DC

## 2016-01-03 NOTE — Progress Notes (Signed)
   Subjective:    Patient ID: [name], female    DOB: 08-12-1956, 60 y.o.   MRN: [id number]  HPI Ms. Voeltz is a 60 year old female with DM2, HTN, GERD, PVD who presents today for a routine office visit. Please see assessment & plan for status of chronic medical problems.   Review of Systems  Constitutional: Positive for appetite change. Negative for unexpected weight change.  Respiratory: Negative for shortness of breath.   Cardiovascular: Negative for chest pain.  Gastrointestinal: Positive for diarrhea. Negative for nausea and vomiting.  Psychiatric/Behavioral: Positive for dysphoric mood. Negative for suicidal ideas.       Objective:   Physical Exam  Constitutional: She is oriented to person, place, and time. She appears well-developed and well-nourished. No distress.  HENT:  Head: Normocephalic and atraumatic.  Mouth/Throat: Oropharynx is clear and moist.  Eyes: Conjunctivae are normal. No scleral icterus.  Neck: No tracheal deviation present.  Cardiovascular: Normal rate and regular rhythm.  Exam reveals no gallop and no friction rub.   No murmur heard. Pulmonary/Chest: Effort normal and breath sounds normal. No stridor. No respiratory distress. She has no wheezes. She has no rales.  Abdominal: Soft. Bowel sounds are normal. She exhibits no distension. There is no tenderness. There is no rebound.  Neurological: She is alert and oriented to person, place, and time.  Skin: Skin is warm and dry. She is not diaphoretic.  Psychiatric: She has a normal mood and affect. Her behavior is normal.          Assessment & Plan:

## 2016-01-03 NOTE — Assessment & Plan Note (Signed)
Overview A1c 9.3, mildly improved from 9.8 in June. Review her blood sugars is notable for most values trending in the 200s with occasional values in the mid to high 100s and a few in the 300s. She reports taking metformin 1000 mg twice daily though inconsistently stated that she could not tolerate taking his medication because of diarrhea. When I asked her to quantify how frequently she is having bowel movements, she reports it is not all that frequently. She also reports not having this medication on hand though said that she took it this morning. She has also been inconsistently taking her Lantus. She does report feeling weaker and change in appetite but no symptoms suggestive of hypoglycemia, like nausea, vomiting.  Assessment Poorly controlled type 2 diabetes in the setting of nonadherence to therapy. Also noted to have end organ manifestations of her diabetes consistent with prior report of neuropathy and retinopathy.  Plan -Discontinue Lantus in favor repaglinide 1 mg 3 times daily before meals given that her meal schedule is often irregular and this medication can be dosed prandially which she is agreeable to trying -Switched to metformin 1000 mg extended release to avoid GI intolerance -Encouraged her to continue checking her blood sugars as frequently as she is doing now -Encouraged her to continue walking to help control her blood sugars -Plan to follow-up in 1 month for reassessment  -Complete diabetic foot exam today

## 2016-01-03 NOTE — Assessment & Plan Note (Signed)
Overview Since her last visit, she reports that her mood has not been the same. She cannot comment on anything specific other than that she is often forced to do things out of responsibilities to others, including her husband. One of the central points of her frustration is her nutrition given her diabetes and how she makes poor dietary choices as a result of the food that she is asked to prepare for her husband. She denies feeling unsafe at home or any suicidal or homicidal ideations though would like to be seen by mental health to ensure that her thinking is not affected by how she is currently feeling.  Assessment Stress of unknown duration in the setting of what appears to be a complicated social situation. No concerns for domestic violence at the time do suspect that her ongoing polysubstance use, notably marijuana, may be affecting her report.  Plan Refer to social worker for additional resources with mental health

## 2016-01-03 NOTE — Patient Instructions (Addendum)
For diabetes, these try walking like you were doing in the past. STOP Lantus and start taking Prandin about 30-60 minutes before you eat. If you skip a meal, please do not take this medication.   Common things to watch for with this medication or nausea, vomiting, diarrhea. If you experience anything other than this, please give [place] a call back.  I also refilled your metformin, so continue taking it twice daily.     Please see me back in 4 weeks. Our goal is <200 for your sugars.

## 2016-01-04 NOTE — Progress Notes (Signed)
Internal Medicine Clinic Attending  Case discussed with Dr. Patel,[name] at the time of the visit.  We reviewed the resident's history and exam and pertinent patient test results.  I agree with the assessment, diagnosis, and plan of care documented in the resident's note.  

## 2016-01-07 ENCOUNTER — Telehealth: Payer: Self-pay | Admitting: Licensed Clinical Social Worker

## 2016-01-07 NOTE — Telephone Encounter (Signed)
Angie Cain was referred to CSW as patient voiced interest in being evaluated by mental health to assess her mood and emotions and whether or not they are clouding her thinking.  CSW placed called to pt.  CSW left message requesting return call. CSW provided contact hours and phone number.

## 2016-01-09 NOTE — Telephone Encounter (Signed)
CSW placed call to Ms. Ottaviano to follow up on referral for behavioral health providers.  Pt states she is looking for one-one counselor utilizing her [employer].  CSW informed Ms. Mittelman of listing CSW obtained of providers specializing in marital conflict, utilized [employer].  Ms. Fryer requesting CSW to mail listing of providers and pt will contact CSW back once choice is made.  Letter mailed with providers and this workers contact information.

## 2016-01-17 ENCOUNTER — Telehealth: Payer: Self-pay | Admitting: Dietician

## 2016-01-17 ENCOUNTER — Telehealth: Payer: Self-pay | Admitting: *Deleted

## 2016-01-17 DIAGNOSIS — IMO0002 Reserved for concepts with insufficient information to code with codable children: Secondary | ICD-10-CM

## 2016-01-17 DIAGNOSIS — E1165 Type 2 diabetes mellitus with hyperglycemia: Secondary | ICD-10-CM

## 2016-01-17 DIAGNOSIS — Z794 Long term (current) use of insulin: Principal | ICD-10-CM

## 2016-01-17 MED ORDER — DICLOFENAC SODIUM 75 MG PO TBEC: 75.0000 mg | DELAYED_RELEASE_TABLET | Freq: Two times a day (BID) | ORAL | Status: DC

## 2016-01-17 NOTE — Telephone Encounter (Signed)
Cannot get her strips to check her blood sugar: last fill was 2 months ago she got 2 bottles, just now running out.  Note prescription is for 3-4 times testing a day- the pharmacy says it went through fine and she has 4 refills left.

## 2016-01-17 NOTE — Telephone Encounter (Signed)
Refill fax request for Voltaren. Dr. Milinda Pointer refilled +5 additional.

## 2016-01-17 NOTE — Telephone Encounter (Signed)
Patient informed. 

## 2016-01-31 ENCOUNTER — Telehealth: Payer: Self-pay | Admitting: Internal Medicine

## 2016-01-31 ENCOUNTER — Other Ambulatory Visit: Payer: Self-pay | Admitting: Internal Medicine

## 2016-01-31 MED ORDER — INSULIN GLARGINE 100 UNIT/ML ~~LOC~~ SOLN: 15.0000 [IU] | Freq: Every day | SUBCUTANEOUS | Status: DC

## 2016-01-31 NOTE — Telephone Encounter (Signed)
cbg 300+ and 367 today this has been happening for over a week, she is very worried, she would like to start her insulin again, please advise, she has an appt w/ you 2/24

## 2016-01-31 NOTE — Telephone Encounter (Signed)
Pt requesting Lantus to be filled. Pt states sugar is very high, requesting the nurse to call back.

## 2016-01-31 NOTE — Telephone Encounter (Signed)
I will reorder her Lantus. She has not been taking it regularly, so we decided to change her regimen. Hopefully, her sugars will go back to her baseline of 200s though we do have room to uptitrate her repaglinide from 1 to 2 mg. I am not sure how often she is taking this medication and will like to discuss at her next visit.

## 2016-02-03 NOTE — Telephone Encounter (Signed)
Spoke w/ pt again Friday pm, she verb understanding of instructions

## 2016-02-04 NOTE — Telephone Encounter (Signed)
Calling patient to be sure she got the Lantus and her blood sugars are improving

## 2016-02-06 NOTE — Telephone Encounter (Signed)
Called patient to find out how her blood sugars are doing now that she is back on lantus: she says the are mostly fluctuating between 200-400. She reports adherence to the prandin, the metformin and  Lantus.  She thinks she has lost weight from her high blood sugars and her eating is "out of whack". She she reports an upcoming mental health appointment and a desire to get everything back on track.   She looks forward to discussing how to improve her blood sugars at her visit with Dr. Posey Pronto tomorrow.

## 2016-02-07 ENCOUNTER — Encounter: Payer: Self-pay | Admitting: Internal Medicine

## 2016-02-07 ENCOUNTER — Ambulatory Visit (INDEPENDENT_AMBULATORY_CARE_PROVIDER_SITE_OTHER): Payer: Medicaid Other | Admitting: Internal Medicine

## 2016-02-07 VITALS — BP 117/66 | HR 83 | Temp 98.2°F | Ht 63.0 in | Wt 138.1 lb

## 2016-02-07 DIAGNOSIS — Z7984 Long term (current) use of oral hypoglycemic drugs: Secondary | ICD-10-CM | POA: Diagnosis not present

## 2016-02-07 DIAGNOSIS — IMO0002 Reserved for concepts with insufficient information to code with codable children: Secondary | ICD-10-CM

## 2016-02-07 DIAGNOSIS — Z794 Long term (current) use of insulin: Secondary | ICD-10-CM

## 2016-02-07 DIAGNOSIS — E1165 Type 2 diabetes mellitus with hyperglycemia: Secondary | ICD-10-CM | POA: Diagnosis not present

## 2016-02-07 LAB — GLUCOSE, CAPILLARY: Glucose-Capillary: 314 mg/dL — ABNORMAL HIGH (ref 65–99)

## 2016-02-07 MED ORDER — INSULIN GLARGINE 100 UNIT/ML ~~LOC~~ SOLN: 20.0000 [IU] | Freq: Every day | SUBCUTANEOUS | Status: DC

## 2016-02-07 MED ORDER — PRAVASTATIN SODIUM 40 MG PO TABS: 40.0000 mg | ORAL_TABLET | Freq: Every evening | ORAL | Status: DC

## 2016-02-07 MED ORDER — REPAGLINIDE 1 MG PO TABS: ORAL_TABLET | ORAL | Status: DC

## 2016-02-07 MED ORDER — DICLOFENAC SODIUM 75 MG PO TBEC: 75.0000 mg | DELAYED_RELEASE_TABLET | Freq: Two times a day (BID) | ORAL | Status: DC

## 2016-02-07 NOTE — Progress Notes (Signed)
   Subjective:    Patient ID: Angie Cain, female    DOB: 1956-04-15, 60 y.o.   MRN: [id number]  HPI [name] is a 60 year old female with DM2, HTN, GERD, PVD who presents today for diabetes reassessment. Please see assessment & plan for status of chronic medical problems.    Review of Systems  Eyes: Positive for visual disturbance (Blurry vision).  Respiratory: Negative for shortness of breath.   Cardiovascular: Negative for chest pain.  Endocrine: Positive for polydipsia and polyuria.  Neurological: Positive for dizziness.       Objective:   Physical Exam  Constitutional: She is oriented to person, place, and time. She appears well-developed and well-nourished. No distress.  HENT:  Head: Normocephalic and atraumatic.  Mouth/Throat: Oropharynx is clear and moist.  Eyes: Conjunctivae are normal. No scleral icterus.  Neck: No tracheal deviation present.  Cardiovascular: Normal rate and regular rhythm.  Exam reveals no gallop and no friction rub.   No murmur heard. Pulmonary/Chest: Effort normal and breath sounds normal. No stridor. No respiratory distress. She has no wheezes. She has no rales.  Abdominal: Soft. Bowel sounds are normal. She exhibits no distension. There is no tenderness. There is no rebound.  Neurological: She is alert and oriented to person, place, and time.  Skin: Skin is warm and dry. She is not diaphoretic.  Psychiatric: She has a normal mood and affect. Her behavior is normal.          Assessment & Plan:

## 2016-02-07 NOTE — Assessment & Plan Note (Addendum)
Overview She presents today with her glucometer which is notable for mostly readings in the 300s to 400s. Roughly 1 week ago, she called in to the clinic requesting Lantus be restarted given her elevated CBG readings, so Lantus was restarted at 15 units at bedtime. She did have an outlier value in the upper 100s several days ago which she attributes to her own titration of her Lantus. She is very motivated to getting back on track with her glycemic control and feels she can do better. She does ackknowledge taking metformin 2000 mg extended release with breakfast along with Prandin 1 mg tablet with meals. She has brought bottles with her which are consistent with her report of how much she is taking. She does  acknowledge symptoms of hyperglycemia, like increased thirst, urinary frequency, dizziness, blurry vision though denies any symptoms of hypoglycemia.  Assessment Poorly controlled type 2 diabetes. Goal A1c 7% though 9.3 on 01/03/16. Given her CBG readings, she will likely require triple therapy, and I'm optimistic giving her increased awareness of her CBG readings over the last week off insulin therapy. Most of her fasting CBGs are at least 200 which indicates room to up titrate her Lantus. As her meal schedule remains variable, focus of glycemic control should be on her postprandial values.  Plan -Increase Lantus to 20 units at bedtime -Instructed her to start taking Prandin on a sliding scale with 1 mg for every 100 increment of CBG [1 mg for CBGs in the 100s, 2 mg for CBG in the 200s, but no more than 4 mg with meals] -Continue metformin XR 2000 mg with breakfast -Have Ms. Butch Penny Plyler contact the patient in one month to assess her glycemic control and follow-up with me in two months in April -Instructed her to call us should CBG < 100 to which she acknowledged understanding -Refilled pravastatin 40 mg

## 2016-02-07 NOTE — Patient Instructions (Addendum)
Please follow instructions on the handout for how much Prandin to take. Continue taking 2 tablets of metformin along with 20 units of Lantus daily.  If your blood sugars ever fall below 100, please call the clinic immediately. Otherwise, [name] will call you next month when I'm out of town.  Let's see each other back in April.

## 2016-02-07 NOTE — Telephone Encounter (Signed)
Done. She verbalized understanding.

## 2016-02-07 NOTE — Telephone Encounter (Signed)
Thank you for calling her, and I look forward to talking to her at this visit. Can you remind her to bring with her all of her diabetic medications and insulin? I would just like to assess how often she is taking the medicine by looking at when it was filled and what she has leftover.

## 2016-02-10 NOTE — Addendum Note (Signed)
Addended by: Riccardo Dubin on: 02/10/2016 10:37 PM   Modules accepted: Level of Service

## 2016-02-10 NOTE — Progress Notes (Signed)
Internal Medicine Clinic Attending  Case discussed with Dr. Patel,[name] at the time of the visit.  We reviewed the resident's history and exam and pertinent patient test results.  I agree with the assessment, diagnosis, and plan of care documented in the resident's note.  

## 2016-03-09 ENCOUNTER — Telehealth: Payer: Self-pay | Admitting: Dietician

## 2016-03-10 NOTE — Telephone Encounter (Signed)
Calling to follow up on blood sugars that were not well controlled at her last visit: she says they are "Running great";  prandin is helping, 1-2 mostly, 3 only a time or two maybe. She is taking Lantus 20-21 units /day. Her blood sugar was  a little low 60-70 one time, mostly [id number]- feels good.  Her smoking cessation is coming a long good. Does not have a taste for it as much, craving is less, eating habits are better, too! Going to mental health Checking blood sugar 2-3 times a day. She'll call back to make an appointment with Dr. Posey Pronto in late April

## 2016-03-20 NOTE — Telephone Encounter (Signed)
This is great news! Thank you for keeping me updated as always.

## 2016-03-23 ENCOUNTER — Encounter: Payer: Self-pay | Admitting: Internal Medicine

## 2016-03-23 ENCOUNTER — Ambulatory Visit (INDEPENDENT_AMBULATORY_CARE_PROVIDER_SITE_OTHER): Payer: Medicaid Other | Admitting: Internal Medicine

## 2016-03-23 VITALS — BP 121/61 | HR 67 | Temp 98.0°F | Ht 63.0 in | Wt 148.7 lb

## 2016-03-23 DIAGNOSIS — M7522 Bicipital tendinitis, left shoulder: Secondary | ICD-10-CM

## 2016-03-23 DIAGNOSIS — M79622 Pain in left upper arm: Secondary | ICD-10-CM | POA: Diagnosis not present

## 2016-03-23 DIAGNOSIS — M7542 Impingement syndrome of left shoulder: Secondary | ICD-10-CM | POA: Insufficient documentation

## 2016-03-23 HISTORY — DX: Impingement syndrome of left shoulder: M75.42

## 2016-03-23 MED ORDER — MELOXICAM 7.5 MG PO TABS: 7.5000 mg | ORAL_TABLET | Freq: Every day | ORAL | Status: DC

## 2016-03-23 NOTE — Progress Notes (Signed)
Patient ID: Angie Cain, female   DOB: [birth year], 60 y.o.   MRN: [id number]     Subjective:   Patient ID: Angie Cain female    DOB: 25-Jun-1956 60 y.o.    MRN: [id number] Health Maintenance Due: Health Maintenance Due  Topic Date Due  . Hepatitis C Screening  1956/10/18  . HIV Screening  06/09/1969  . ZOSTAVAX  06/09/2014  . PAP SMEAR  08/13/2014  . OPHTHALMOLOGY EXAM  10/06/2015    _________________________________________________  HPI: Angie Cain is a 60 y.o. female here for an acute  visit.  Pt has a PMH outlined below.  Please see problem-based charting assessment and plan for further status of patient's chronic medical problems addressed at today's visit.  PMH: Past Medical History  Diagnosis Date  . Atherosclerotic peripheral vascular disease with intermittent claudication ([place])     s/p fem-fem bypass 2011. ABI 07/2012 0.5 R 0.65 L  . Hypertension   . GERD (gastroesophageal reflux disease)   . Tobacco abuse   . Allergy   . Anxiety     occasional  . Arthritis   . Hyperlipidemia   . Personal history of colonic polyps - adenomas 03/07/2014    Medications: Current Outpatient Prescriptions on File Prior to Visit  Medication Sig Dispense Refill  . ACCU-CHEK SOFTCLIX LANCETS lancets Use as instructed 100 each 12  . calcium-vitamin D (OSCAL WITH D) 500-200 MG-UNIT per tablet Take 1 tablet by mouth 2 (two) times daily. 120 tablet 3  . diclofenac (VOLTAREN) 75 MG EC tablet Take 1 tablet (75 mg total) by mouth 2 (two) times daily. 60 tablet 5  . glucose blood (ACCU-CHEK AVIVA) test strip Use 3 to 4 times daily to check blood sugar, diag code E11.65. Insulin dependent 100 each 6  . insulin glargine (LANTUS) 100 UNIT/ML injection Inject 0.2 mLs (20 Units total) into the skin at bedtime. 10 mL 3  . lisinopril-hydrochlorothiazide (PRINZIDE,ZESTORETIC) 20-25 MG per tablet Take 1 tablet by mouth daily. 30 tablet 11  . metFORMIN (GLUMETZA) 1000 MG (MOD) 24 hr  tablet Take 2 tablets (2,000 mg total) by mouth daily with breakfast. 30 tablet 11  . omeprazole (PRILOSEC) 20 MG capsule TAKE ONE CAPSULE BY MOUTH ONCE DAILY AS NEEDED 30 capsule 6  . pravastatin (PRAVACHOL) 40 MG tablet Take 1 tablet (40 mg total) by mouth every evening. 90 tablet 3  . repaglinide (PRANDIN) 1 MG tablet Take 1 tablet for CBG 100s, 2 tablets for CBG 200s, 3 tablets for CBG 300s, 4 tablets for CBG 400s. 120 tablet 1   No current facility-administered medications on file prior to visit.    Allergies: Allergies  Allergen Reactions  . Metronidazole Itching  . Shellfish Allergy Itching    Mouth irritations    FH: Family History  Problem Relation Age of Onset  . Diabetes Mother   . Diabetes Father   . Diabetes Sister   . Hypertension Sister   . Diabetes Brother   . Hypertension Brother   . Colon cancer Neg Hx   . Esophageal cancer Neg Hx   . Stomach cancer Neg Hx   . Rectal cancer Neg Hx     SH: Social History   Social History  . Marital Status: Married    Spouse Name: N/A  . Number of Children: N/A  . Years of Education: N/A   Social History Main Topics  . Smoking status: Current Every Day Smoker -- 0.10 packs/day    Types: Cigarettes  . Smokeless tobacco:  Never Used     Comment: 4-6 cigs in a week 2-7 per day  . Alcohol Use: 0.0 oz/week    1-2 Glasses of wine per week     Comment: Occasionally  . Drug Use: No  . Sexual Activity: Not on file   Other Topics Concern  . Not on file   Social History Narrative    Review of Systems: Constitutional: Negative for fever, chills and weight loss.  Eyes: Negative for blurred vision.  Respiratory: Negative for cough and shortness of breath.  Cardiovascular: Negative for chest pain, palpitations and leg swelling.  Gastrointestinal: Negative for nausea, vomiting, abdominal pain, diarrhea, constipation and blood in stool.  Genitourinary: Negative for dysuria, urgency and frequency.  Musculoskeletal:  Negative for myalgias and back pain.  Neurological: Negative for dizziness, weakness and headaches.     Objective:   Vital Signs: There were no vitals filed for this visit.    BP Readings from Last 3 Encounters:  02/07/16 117/66  01/03/16 135/68  08/09/15 124/67    Physical Exam: Constitutional: Vital signs reviewed.  Patient is in NAD and cooperative with exam.  Head: Normocephalic and atraumatic. Eyes: EOMI, conjunctivae nl, no scleral icterus.  Neck: Supple. Cardiovascular: RRR, no MRG. Pulmonary/Chest: normal effort, CTAB, no wheezes, rales, or rhonchi. Abdominal: Soft. NT/ND +BS. Musculoskeletal: FROM, without pain,swelling, or deformity.  Neurological: A&O x3, cranial nerves II-XII are grossly intact, moving all extremities. Extremities: 2+DP b/l; no LE edema. Skin: Warm, dry and intact. No rash.   Assessment & Plan:   Assessment and plan was discussed and formulated with my attending.

## 2016-03-23 NOTE — Patient Instructions (Signed)
Thank you for your visit today.   Please return to the internal medicine clinic in about 3 weeks or sooner if needed.   You may take mobic (meloxicam) 7.5mg  daily for left arm pain.  We may need to refer you to sports medicine if the pain continues after a trial of mobic.    Please be sure to bring all of your medications with you to every visit; this includes herbal supplements, vitamins, eye drops, and any over-the-counter medications.   Should you have any questions regarding your medications and/or any new or worsening symptoms, please be sure to call the clinic at [phone].   If you believe that you are suffering from a life threatening condition or one that may result in the loss of limb or function, then you should call 911 and proceed to the nearest Emergency Department.   A healthy lifestyle and preventative care can promote health and wellness.   Maintain regular health, dental, and eye exams.  Eat a healthy diet. Foods like vegetables, fruits, whole grains, low-fat dairy products, and lean protein foods contain the nutrients you need without too many calories. Decrease your intake of foods high in solid fats, added sugars, and salt. Get information about a proper diet from your caregiver, if necessary.  Regular physical exercise is one of the most important things you can do for your health. Most adults should get at least 150 minutes of moderate-intensity exercise (any activity that increases your heart rate and causes you to sweat) each week. In addition, most adults need muscle-strengthening exercises on 2 or more days a week.   Maintain a healthy weight. The body mass index (BMI) is a screening tool to identify possible weight problems. It provides an estimate of body fat based on height and weight. Your caregiver can help determine your BMI, and can help you achieve or maintain a healthy weight. For adults 20 years and older:  A BMI below 18.5 is considered  underweight.  A BMI of 18.5 to 24.9 is normal.  A BMI of 25 to 29.9 is considered overweight.  A BMI of 30 and above is considered obese.

## 2016-03-23 NOTE — Assessment & Plan Note (Addendum)
Pt p/w pain in the left upper arm (bicep area) and pain in the bend of the elbow that has been persistent for 1 month.  Very tender to palpation of the biceps and antecubital fossa, but FROM with intact strength and sensation.  She has tried tramadol and a muscle relaxant which has not helped.  Since she is already on diclofenac twice daily and this has not been helpful, I think SM may be the best option for her.  However, she has meloxicam at home which is worth trying.   -advised to d/c diclofenac and try mobic which she already has at home  -RTC in May   -may need injection by SM if pain does not subside with mobic

## 2016-03-24 LAB — BMP8+ANION GAP
Anion Gap: 17 mmol/L (ref 10.0–18.0)
BUN/Creatinine Ratio: 23 (ref 12–28)
BUN: 13 mg/dL (ref 8–27)
CHLORIDE: 99 mmol/L (ref 96–106)
CO2: 24 mmol/L (ref 18–29)
Calcium: 9.2 mg/dL (ref 8.7–10.3)
Creatinine, Ser: 0.57 mg/dL (ref 0.57–1.00)
GFR, EST AFRICAN AMERICAN: 116 mL/min/{1.73_m2} (ref >59)
GFR, EST NON AFRICAN AMERICAN: 100 mL/min/{1.73_m2} (ref >59)
GLUCOSE: 93 mg/dL (ref 65–99)
POTASSIUM: 4 mmol/L (ref 3.5–5.2)
Sodium: 140 mmol/L (ref 134–144)

## 2016-03-24 NOTE — Progress Notes (Signed)
Case discussed with Dr. Gill soon after the resident saw the patient.  We reviewed the resident's history and exam and pertinent patient test results.  I agree with the assessment, diagnosis, and plan of care documented in the resident's note. 

## 2016-04-09 ENCOUNTER — Telehealth: Payer: Self-pay | Admitting: Internal Medicine

## 2016-04-09 NOTE — Telephone Encounter (Signed)
APPT. REMINDER CALL, LMTCB °

## 2016-04-10 ENCOUNTER — Encounter: Payer: Medicaid Other | Admitting: Internal Medicine

## 2016-04-13 ENCOUNTER — Ambulatory Visit: Payer: Medicaid Other | Admitting: Internal Medicine

## 2016-04-27 ENCOUNTER — Ambulatory Visit (INDEPENDENT_AMBULATORY_CARE_PROVIDER_SITE_OTHER): Payer: Medicaid Other | Admitting: Internal Medicine

## 2016-04-27 ENCOUNTER — Encounter: Payer: Self-pay | Admitting: Internal Medicine

## 2016-04-27 VITALS — BP 141/72 | HR 56 | Temp 98.2°F | Wt 148.0 lb

## 2016-04-27 DIAGNOSIS — E1165 Type 2 diabetes mellitus with hyperglycemia: Secondary | ICD-10-CM | POA: Diagnosis not present

## 2016-04-27 DIAGNOSIS — M79622 Pain in left upper arm: Secondary | ICD-10-CM

## 2016-04-27 DIAGNOSIS — Z794 Long term (current) use of insulin: Secondary | ICD-10-CM

## 2016-04-27 DIAGNOSIS — IMO0002 Reserved for concepts with insufficient information to code with codable children: Secondary | ICD-10-CM

## 2016-04-27 DIAGNOSIS — M7522 Bicipital tendinitis, left shoulder: Secondary | ICD-10-CM

## 2016-04-27 DIAGNOSIS — Z9114 Patient's other noncompliance with medication regimen: Secondary | ICD-10-CM | POA: Diagnosis not present

## 2016-04-27 DIAGNOSIS — M8580 Other specified disorders of bone density and structure, unspecified site: Secondary | ICD-10-CM | POA: Diagnosis not present

## 2016-04-27 LAB — GLUCOSE, CAPILLARY: GLUCOSE-CAPILLARY: 140 mg/dL — AB (ref 65–99)

## 2016-04-27 LAB — POCT GLYCOSYLATED HEMOGLOBIN (HGB A1C): HEMOGLOBIN A1C: 8.5

## 2016-04-27 MED ORDER — CANAGLIFLOZIN 100 MG PO TABS: 100.0000 mg | ORAL_TABLET | Freq: Every day | ORAL | Status: DC

## 2016-04-27 NOTE — Patient Instructions (Addendum)
For your sugars, keep up the good work!  We are switching you to Invokana which is easier to dose. Once you pick up this new medication, STOP prandin.  Please see me back before the end of the month to see how you are doing on this medication.

## 2016-04-27 NOTE — Assessment & Plan Note (Signed)
Overview She has not noted any improvement in her pain of the left arm and would like a referral to sports medicine today. She has tried leftover meloxicam at home as well as diclofenac 75 mg twice daily without much relief in her pain.  Assessment Suspected biceps tendinitis. Unable to fully examine and reassess today due to time constraint.  Plan Refer to sports medicine for further evaluation

## 2016-04-27 NOTE — Progress Notes (Signed)
   Subjective:    Patient ID: Angie Cain, female    DOB: [birth year], 60 y.o.   MRN: [id number]  HPI  Angie Cain is a 60 year old female with DM2, HTN, GERD, PVD who presents today for diabetes reassessment. Please see assessment & plan for status of chronic medical problems.    Review of Systems  Constitutional: Negative for appetite change and unexpected weight change.  Respiratory: Negative for shortness of breath.   Cardiovascular: Negative for chest pain.  Gastrointestinal: Negative for nausea, vomiting, abdominal pain and diarrhea.  Musculoskeletal: Positive for arthralgias.  Neurological: Positive for tremors.       Objective:   Physical Exam  Constitutional: She is oriented to person, place, and time. She appears well-developed and well-nourished. No distress.  HENT:  Head: Normocephalic and atraumatic.  Mouth/Throat: Oropharynx is clear and moist.  Eyes: Conjunctivae are normal. No scleral icterus.  Neck: No tracheal deviation present.  Cardiovascular: Normal rate and regular rhythm.  Exam reveals no gallop and no friction rub.   No murmur heard. Pulmonary/Chest: Effort normal and breath sounds normal. No stridor. No respiratory distress. She has no wheezes. She has no rales.  Abdominal: Soft. Bowel sounds are normal. She exhibits no distension. There is no tenderness. There is no rebound.  Neurological: She is alert and oriented to person, place, and time.  Skin: Skin is warm and dry. She is not diaphoretic.          Assessment & Plan:

## 2016-04-27 NOTE — Assessment & Plan Note (Addendum)
Overview Her A1c today is improved at 8.5, down from 9.3 back in January. 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 She gone his adherence to Lantus 20 units at bedtime, metformin 2000 mg with breakfast, Prandin 1 mg tablets per sliding scale. When I asked her to describe the sliding scale, she acknowledges that she holds the medication if it her blood sugar is "double digit" though won't take it if it is in the low 100s. When she saw her blood sugar as high as 268, she did take 3 tablets. Per review of her blood sugar log today she does have several readings in the 50s and 60s which is concerning, and she tells me she feels it with "jitteriness" and an overall sense of not feeling well. She cannot describe to me the antecedent events leading to these low readings though does acknowledge she does not eat while she is working during the night shift.  Assessment Insulin-dependent type 2 diabetes currently on triple therapy with A1c goal less than 7%. She did improve in the interval since her last visit though adherence to medications is significant barrier for her due to social situation.  Plan -Continue Lantus 20 units at bedtime -Continue metformin 2000 mg at breakfast. I suggested that she split the dose up though she prefers to take both tablets in the morning. -Prescribe canagliflozin 100 mg daily with breakfast with 30 day trial card until we can process the preauthorization paperwork. I advised her to discontinue repaglinide if she is able to pick up this medication, but she is to continue repaglinide sliding scale if she is unable to pick up his medication to which she acknowledged understanding. -Refer for eye exam today to Dr. Zenia Resides office per her preference -Recommended she follow up with me before the end of the month to assess her response to therapy

## 2016-04-27 NOTE — Assessment & Plan Note (Signed)
Overview She has not been taking calcium/vitamin D 500/200 mg unit tablets and was unaware that she should be on this medication. Though she has not yet gone through with DEXA imaging, she is agreeable to having this done now.  Assessment At risk for osteoporosis given osteopenia noted on DEXA imaging 2012 and nonadherence to supplementation.  Plan Schedule DEXA scan today

## 2016-04-28 NOTE — Progress Notes (Signed)
Internal Medicine Clinic Attending  Case discussed with Dr. Patel,[name] at the time of the visit.  We reviewed the resident's history and exam and pertinent patient test results.  I agree with the assessment, diagnosis, and plan of care documented in the resident's note.  

## 2016-05-06 ENCOUNTER — Encounter: Payer: Self-pay | Admitting: *Deleted

## 2016-05-12 ENCOUNTER — Encounter: Payer: Self-pay | Admitting: Internal Medicine

## 2016-05-12 ENCOUNTER — Telehealth: Payer: Self-pay | Admitting: Dietician

## 2016-05-12 ENCOUNTER — Ambulatory Visit (INDEPENDENT_AMBULATORY_CARE_PROVIDER_SITE_OTHER): Payer: Medicaid Other | Admitting: Internal Medicine

## 2016-05-12 VITALS — BP 127/69 | HR 65 | Temp 98.6°F | Wt 145.2 lb

## 2016-05-12 DIAGNOSIS — IMO0002 Reserved for concepts with insufficient information to code with codable children: Secondary | ICD-10-CM

## 2016-05-12 DIAGNOSIS — E1165 Type 2 diabetes mellitus with hyperglycemia: Secondary | ICD-10-CM

## 2016-05-12 DIAGNOSIS — Z794 Long term (current) use of insulin: Secondary | ICD-10-CM

## 2016-05-12 DIAGNOSIS — M8580 Other specified disorders of bone density and structure, unspecified site: Secondary | ICD-10-CM

## 2016-05-12 DIAGNOSIS — M85852 Other specified disorders of bone density and structure, left thigh: Secondary | ICD-10-CM | POA: Diagnosis present

## 2016-05-12 DIAGNOSIS — E1151 Type 2 diabetes mellitus with diabetic peripheral angiopathy without gangrene: Secondary | ICD-10-CM

## 2016-05-12 MED ORDER — CANAGLIFLOZIN-METFORMIN HCL ER 150-1000 MG PO TB24: 1.0000 | ORAL_TABLET | Freq: Every day | ORAL | Status: DC

## 2016-05-12 MED ORDER — ASPIRIN EC 81 MG PO TBEC: 81.0000 mg | DELAYED_RELEASE_TABLET | Freq: Every day | ORAL | Status: AC

## 2016-05-12 NOTE — Assessment & Plan Note (Addendum)
Assessment Review of her blood sugar log shows values trending mostly in the low to mid 100s. She does have 2 outliers: 78, 69. She has been taking Lantus 20-21 units at bedtime, and confirms her values are fasting blood sugars since she checks them before eating her first meal. She did pick up canagliflozin 100 mg after her last visit but was unsure if she was having an adverse effect of nausea as a consequence of this medication and did skip 1-2 doses at which time she took metformin 1000 mg extended release twice daily [instead of once which is inconsistent with what I had charted in the system]. Otherwise, she tells me she takes canagliflozin with metformin 1000 mg and Lantus 20 units at bedtime. She does report polyuria though denies dizziness or lightheadedness.  Plan -Discontinue canagliflozin and metformin extended release -Prescribed canagliflozin/metformin extended release 150/1000 mg tablets to be taken once daily with breakfast -Recheck BMET today to assess electrolytes and renal function -Continue Lantus 20 units at bedtime though recommended she titrate down 2 units if she notice her fasting blood sugars are below 100 -Encouraged her to keep up the good work and to anticipate her A1c will decrease if her sugars maintain -Inquired about whether or not she wanted to see Dr. Venetia Maxon or Dr. Katy Fitch for an eye exam though she said she has not made up her mind and will speak with Dr. Zenia Resides office -Follow-up in 2 months for A1c recheck  ADDENDUM 05/13/2016  3:53 PM:  BMET reassuring for no electrolyte disturbances with SGLT2 inhibitor therapy.

## 2016-05-12 NOTE — Telephone Encounter (Signed)
Called patient's pharmacy as part of project trying to help patient's with a1C between 8-9% lower their blood sugars to target:  [facility] [place]- has not used since January 2016.  [address]:  1. Lantus- 1  vial 5/6, 4/4, 3/11,  2/17 , 12/17, getting this fairly regularly 2. Invokana- 04/27/16- 30 days 3. Metformin er- 04/18/16 , 03/17/16, 02/14/16-she got a 30 day supply, getting this regulary  Syringes- none on her profile Test strips- aviva 100 3/3, 2/3 100  Suggest changing her to the lantus Solostar if she agrees and sending in a prescription for pen needles as well. This will encourage using insulin before it's discard date.

## 2016-05-12 NOTE — Patient Instructions (Addendum)
For your diabetes, please take Invokanamet, one tablet in the morning.   For sports medicine, please call [phone] to make an appointment.  For your legs, please take aspirin 81mg .   If your Vitamin D is low, we will call you back about taking a supplement.  Please see me back in 2 months.

## 2016-05-12 NOTE — Progress Notes (Signed)
   Subjective:    Patient ID: Angie Cain, female    DOB: 07-May-1956, 60 y.o.   MRN: [id number]  HPI Angie Cain is a 60 year old female who presents today for diabetes. Please see assessment & plan for status of chronic medical problems.    Review of Systems     Objective:   Physical Exam  Constitutional: She is oriented to person, place, and time. She appears well-developed and well-nourished. No distress.  HENT:  Head: Normocephalic and atraumatic.  Mouth/Throat: Oropharynx is clear and moist.  Eyes: Conjunctivae are normal. No scleral icterus.  Neck: No tracheal deviation present.  Pulmonary/Chest: Effort normal. No stridor. No respiratory distress.  Neurological: She is alert and oriented to person, place, and time.  Skin: Skin is warm and dry. She is not diaphoretic.          Assessment & Plan:

## 2016-05-12 NOTE — Telephone Encounter (Signed)
Please call and inquire about changing the pens.  We deferred on supplementation today only because there is limited evidence that it affects outcomes though will see what her Vitamin D results at.  I switched Invokana & metformin to Invokamet to reduce pill burden. Though I recommended waiting to fill this medicine once she runs out, she wanted to pick it up today.

## 2016-05-12 NOTE — Assessment & Plan Note (Addendum)
Assessment In reviewing her medications, it appears she is not on an antiplatelet agent. She is already adherent to pravastatin 40 mg every evening. She does complain of leg pain though is unable to discern between her diabetes and claudication as a consequence of underlying peripheral vascular disease.  Plan -Recommended exercise as tolerated -Prescribe aspirin 81 mg to be taken daily

## 2016-05-12 NOTE — Assessment & Plan Note (Addendum)
Assessment DEXA scan 04/29/16 notable for T score spine -1.3, left femur -2.1. Using most recent DEXA scan, she had a 10-year probability for major osteoporotic fracture of 3.1 % and a 10-year probability for hip fracture of 0.3 %. Her other risk factors for progression of osteopenia to osteoporosis include ongoing tobacco and alcohol use.  Plan -Check vitamin D today to assess for depletion -Recommended she continue aerobic exercise as much as tolerated  ADDENDUM 05/13/2016  3:54 PM:  Vitamin D noted to be low at 19.9 though will not consider supplementation as it does not affect outcomes.

## 2016-05-13 LAB — BMP8+ANION GAP
ANION GAP: 18 mmol/L (ref 10.0–18.0)
BUN/Creatinine Ratio: 21 (ref 12–28)
BUN: 17 mg/dL (ref 8–27)
CALCIUM: 9.5 mg/dL (ref 8.7–10.3)
CO2: 24 mmol/L (ref 18–29)
CREATININE: 0.8 mg/dL (ref 0.57–1.00)
Chloride: 101 mmol/L (ref 96–106)
GFR calc Af Amer: 92 mL/min/{1.73_m2} (ref >59)
GFR calc non Af Amer: 80 mL/min/{1.73_m2} (ref >59)
Glucose: 187 mg/dL — ABNORMAL HIGH (ref 65–99)
POTASSIUM: 4.2 mmol/L (ref 3.5–5.2)
Sodium: 143 mmol/L (ref 134–144)

## 2016-05-13 LAB — VITAMIN D 25 HYDROXY (VIT D DEFICIENCY, FRACTURES): VIT D 25 HYDROXY: 19.9 ng/mL — AB (ref 30.0–100.0)

## 2016-05-13 NOTE — Progress Notes (Signed)
Internal Medicine Clinic Attending  Case discussed with Dr. Patel,[name] at the time of the visit.  We reviewed the resident's history and exam and pertinent patient test results.  I agree with the assessment, diagnosis, and plan of care documented in the resident's note.  

## 2016-05-21 ENCOUNTER — Telehealth: Payer: Self-pay | Admitting: Dietician

## 2016-05-21 NOTE — Telephone Encounter (Signed)
Left voicemail message.

## 2016-05-25 ENCOUNTER — Ambulatory Visit (INDEPENDENT_AMBULATORY_CARE_PROVIDER_SITE_OTHER): Payer: Medicaid Other | Admitting: Sports Medicine

## 2016-05-25 ENCOUNTER — Other Ambulatory Visit: Payer: Self-pay | Admitting: Internal Medicine

## 2016-05-25 ENCOUNTER — Encounter: Payer: Self-pay | Admitting: Sports Medicine

## 2016-05-25 VITALS — BP 97/73 | HR 68 | Ht 65.0 in | Wt 148.0 lb

## 2016-05-25 DIAGNOSIS — M7542 Impingement syndrome of left shoulder: Secondary | ICD-10-CM

## 2016-05-25 DIAGNOSIS — M75102 Unspecified rotator cuff tear or rupture of left shoulder, not specified as traumatic: Secondary | ICD-10-CM | POA: Diagnosis present

## 2016-05-25 MED ORDER — NAPROXEN 500 MG PO TABS: 500.0000 mg | ORAL_TABLET | Freq: Two times a day (BID) | ORAL | Status: DC

## 2016-05-25 NOTE — Progress Notes (Signed)
Patient ID: Angie Cain, female   DOB: Jun 27, 1956, 60 y.o.   MRN: [id number]  CC: L arm/shoulder pain  HPI: Angie Cain is a 60 yo female with PMH DM with neuropathy, ostepenia, and PVD presenting with several month history of L arm pain.  Pt states she has noticed pain in her upper arm (grabs mid humerus) and shoulder for at least the past several months, possibly longer.  Pain is sharp and quick onset and does not last long, comes on with certain motions/positions (reaching over head, forward, behind her back).  When it comes on it is in the shoulder and radiates down to the elbow.  Pt has longstanding DM with tingling neuropathy in bilateral hands and feet at baseline.  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  Has been taking scheduled oral diclofenac for several months possibly for this pain although she is unsure why, it has not helped.  Mobic in the past has not helped either.  ROS:  Gen: - fevers/chills   MSK: - except as per HPI Neuro: + tingling in hands/feet  PE:  BP 97/73 mmHg  Pulse 68  Ht 5\' 5"  (1.651 m)  Wt 148 lb (67.132 kg)  BMI 24.63 kg/m2 Gen: 60 yo female sitting on exam table in NAD HEENT: normocephalic/atraumatic Pulm: normal work of breathing MSK:  LUE: no abnormalities noted on inspection, full ROM however at times apprehensive abduction and internal rotation, + TTP over L trapezius, biceps tendon, anterolateral shoulder, and mid-shaft of lateral humerus (of note, also tender on contralateral biceps tendon and anterolateral shoulder), strength 5/5 throughout, sensation grossly intact, + Hawkings, + cross arm test, - empty can, Positive painful arc  A/P: Angie Cain is a 60 yo female with PMH per above presenting with likely L rotator cuff impingement  L rotator cuff impingement Most likely diagnosis given history of positional, quick-onset pain and positive impingement  findings on exam -- Will try naproxen sodium for pain relief given failure of other remedies -- Offered steroid injection however pt refused -- Schedule one PT visit to learn Jobe HEP -- Encourage home exercises thereafter -- F/u three weeks to re-eval. Reconsider injection if not improving.  Nils Flack, MS4  Patient seen and evaluated with the medical student. I agree with the above plan of care. Patient will learn a home exercise program and start naproxen sodium twice daily. Follow up in 3 weeks. If shoulder pain persists then we will reconsider merits of a subacromial cortisone injection.

## 2016-05-27 NOTE — Telephone Encounter (Signed)
As this patient was seen by me and deemed necessary for their treatment, I will refill this prescription for test strips.

## 2016-05-28 ENCOUNTER — Other Ambulatory Visit: Payer: Self-pay

## 2016-06-04 ENCOUNTER — Other Ambulatory Visit: Payer: Self-pay

## 2016-06-04 NOTE — Telephone Encounter (Signed)
Pt states invokanamet caused her to have side effects. Pt requesting test strips to be filled.

## 2016-06-04 NOTE — Telephone Encounter (Signed)
done

## 2016-06-18 ENCOUNTER — Other Ambulatory Visit: Payer: Self-pay | Admitting: Internal Medicine

## 2016-06-23 ENCOUNTER — Encounter (HOSPITAL_COMMUNITY): Payer: Self-pay

## 2016-06-23 ENCOUNTER — Ambulatory Visit: Payer: Medicaid Other | Admitting: Sports Medicine

## 2016-06-23 ENCOUNTER — Emergency Department (HOSPITAL_COMMUNITY)
Admission: EM | Admit: 2016-06-23 | Discharge: 2016-06-23 | Disposition: A | Discharge status: Home / Self Care | Payer: Medicaid Other | Attending: Emergency Medicine | Admitting: Emergency Medicine

## 2016-06-23 DIAGNOSIS — I10 Essential (primary) hypertension: Secondary | ICD-10-CM | POA: Insufficient documentation

## 2016-06-23 DIAGNOSIS — F17210 Nicotine dependence, cigarettes, uncomplicated: Secondary | ICD-10-CM | POA: Diagnosis not present

## 2016-06-23 DIAGNOSIS — R51 Headache: Secondary | ICD-10-CM | POA: Diagnosis not present

## 2016-06-23 DIAGNOSIS — E119 Type 2 diabetes mellitus without complications: Secondary | ICD-10-CM | POA: Diagnosis not present

## 2016-06-23 DIAGNOSIS — Z794 Long term (current) use of insulin: Secondary | ICD-10-CM | POA: Diagnosis not present

## 2016-06-23 DIAGNOSIS — Z7982 Long term (current) use of aspirin: Secondary | ICD-10-CM | POA: Insufficient documentation

## 2016-06-23 DIAGNOSIS — R519 Headache, unspecified: Secondary | ICD-10-CM

## 2016-06-23 DIAGNOSIS — Z7984 Long term (current) use of oral hypoglycemic drugs: Secondary | ICD-10-CM | POA: Insufficient documentation

## 2016-06-23 HISTORY — DX: Type 2 diabetes mellitus without complications: E11.9

## 2016-06-23 LAB — URINALYSIS, ROUTINE W REFLEX MICROSCOPIC
Bilirubin Urine: NEGATIVE
Glucose, UA: NEGATIVE mg/dL
Hgb urine dipstick: NEGATIVE
Ketones, ur: NEGATIVE mg/dL
Leukocytes, UA: NEGATIVE
Nitrite: NEGATIVE
Protein, ur: NEGATIVE mg/dL
Specific Gravity, Urine: 1.019 (ref 1.005–1.030)
pH: 7.5 (ref 5.0–8.0)

## 2016-06-23 LAB — COMPREHENSIVE METABOLIC PANEL WITH GFR
ALT: 9 U/L — ABNORMAL LOW (ref 14–54)
AST: 12 U/L — ABNORMAL LOW (ref 15–41)
Albumin: 3.8 g/dL (ref 3.5–5.0)
Alkaline Phosphatase: 55 U/L (ref 38–126)
Anion gap: 9 (ref 5–15)
BUN: 11 mg/dL (ref 6–20)
CO2: 27 mmol/L (ref 22–32)
Calcium: 9 mg/dL (ref 8.9–10.3)
Chloride: 101 mmol/L (ref 101–111)
Creatinine, Ser: 0.71 mg/dL (ref 0.44–1.00)
GFR calc Af Amer: >60 mL/min
GFR calc non Af Amer: >60 mL/min
Glucose, Bld: 110 mg/dL — ABNORMAL HIGH (ref 65–99)
Potassium: 3.3 mmol/L — ABNORMAL LOW (ref 3.5–5.1)
Sodium: 137 mmol/L (ref 135–145)
Total Bilirubin: 0.8 mg/dL (ref 0.3–1.2)
Total Protein: 6.9 g/dL (ref 6.5–8.1)

## 2016-06-23 LAB — CBC WITH DIFFERENTIAL/PLATELET
Basophils Absolute: 0 K/uL (ref 0.0–0.1)
Basophils Relative: 0 %
Eosinophils Absolute: 0 K/uL (ref 0.0–0.7)
Eosinophils Relative: 1 %
HCT: 38.6 % (ref 36.0–46.0)
Hemoglobin: 12.5 g/dL (ref 12.0–15.0)
Lymphocytes Relative: 37 %
Lymphs Abs: 2.3 K/uL (ref 0.7–4.0)
MCH: 24.4 pg — ABNORMAL LOW (ref 26.0–34.0)
MCHC: 32.4 g/dL (ref 30.0–36.0)
MCV: 75.2 fL — ABNORMAL LOW (ref 78.0–100.0)
Monocytes Absolute: 0.5 K/uL (ref 0.1–1.0)
Monocytes Relative: 7 %
Neutro Abs: 3.5 K/uL (ref 1.7–7.7)
Neutrophils Relative %: 55 %
Platelets: 311 K/uL (ref 150–400)
RBC: 5.13 MIL/uL — ABNORMAL HIGH (ref 3.87–5.11)
RDW: 16.1 % — ABNORMAL HIGH (ref 11.5–15.5)
WBC: 6.3 K/uL (ref 4.0–10.5)

## 2016-06-23 LAB — CBG MONITORING, ED: GLUCOSE-CAPILLARY: 144 mg/dL — AB (ref 65–99)

## 2016-06-23 MED ORDER — DIPHENHYDRAMINE HCL 50 MG/ML IJ SOLN
12.5000 mg | Freq: Once | INTRAMUSCULAR | Status: AC
  Administered 2016-06-23: 12.5 mg via INTRAVENOUS
  Filled 2016-06-23: qty 1

## 2016-06-23 MED ORDER — KETOROLAC TROMETHAMINE 30 MG/ML IJ SOLN
30.0000 mg | Freq: Once | INTRAMUSCULAR | Status: AC
  Administered 2016-06-23: 30 mg via INTRAVENOUS
  Filled 2016-06-23: qty 1

## 2016-06-23 MED ORDER — METOCLOPRAMIDE HCL 5 MG/ML IJ SOLN
10.0000 mg | Freq: Once | INTRAMUSCULAR | Status: AC
  Administered 2016-06-23: 10 mg via INTRAVENOUS
  Filled 2016-06-23: qty 2

## 2016-06-23 MED ORDER — POTASSIUM CHLORIDE CRYS ER 20 MEQ PO TBCR
20.0000 meq | EXTENDED_RELEASE_TABLET | Freq: Once | ORAL | Status: AC
  Administered 2016-06-23: 20 meq via ORAL
  Filled 2016-06-23: qty 1

## 2016-06-23 MED ORDER — SODIUM CHLORIDE 0.9 % IV BOLUS (SEPSIS)
500.0000 mL | Freq: Once | INTRAVENOUS | Status: AC
  Administered 2016-06-23: 500 mL via INTRAVENOUS

## 2016-06-23 NOTE — ED Provider Notes (Signed)
CSN: [id number]     Arrival date & time 06/23/16  [id number] History   First MD Initiated Contact with Patient 06/23/16 1141     Chief Complaint  Patient presents with  . Headache  . neuropathy      (Consider location/radiation/quality/duration/timing/severity/associated sxs/prior Treatment) HPI   Angie Cain is a 60 y.o F with a pmhx of DM, PVD, HTN, HLD who presents the ED today complaining of a headache. Patient states that she has intermittent frontal headache for the last month that has progressively worsened in the last week. Pt has hx of similar symptoms. Last night she said the headache was so severe cause related down. It continued into this morning and she had one episode of nonbilious, nonbilious emesis. She states that she switched her diabetic medication 1 month ago and feels that this has been causing changes to her body including itchiness, changes in her vision and increased neuropathic pain in her hands and feet. Patient is not tried taking anything for her headache. She denies any neck pain or stiffness, fevers, chills, chest pain or shortness of breath, blurry vision, weakness.  Past Medical History  Diagnosis Date  . Atherosclerotic peripheral vascular disease with intermittent claudication ([place])     s/p fem-fem bypass 2011. ABI 07/2012 0.5 R 0.65 L  . Hypertension   . GERD (gastroesophageal reflux disease)   . Tobacco abuse   . Allergy   . Anxiety     occasional  . Arthritis   . Hyperlipidemia   . Personal history of colonic polyps - adenomas 03/07/2014  . Diabetes mellitus without complication [facility])    Past Surgical History  Procedure Laterality Date  . Vein bypass surgery  06/2010    vein from left to right hip  . Vaginal hysterectomy     Family History  Problem Relation Age of Onset  . Diabetes Mother   . Diabetes Father   . Diabetes Sister   . Hypertension Sister   . Diabetes Brother   . Hypertension Brother   . Colon cancer Neg Hx   . Esophageal  cancer Neg Hx   . Stomach cancer Neg Hx   . Rectal cancer Neg Hx    Social History  Substance Use Topics  . Smoking status: Current Every Day Smoker -- 0.30 packs/day    Types: Cigarettes  . Smokeless tobacco: Never Used     Comment: 4-6 cigs in a week 2-7 per day  . Alcohol Use: 0.0 oz/week    1-2 Glasses of wine per week     Comment: Occasionally   OB History    No data available     Review of Systems  All other systems reviewed and are negative.     Allergies  Metronidazole and Shellfish allergy  Home Medications   Prior to Admission medications   Medication Sig Start Date End Date Taking? Authorizing Provider  ACCU-CHEK AVIVA PLUS test strip USE 3 TO 4 TIMES DAILY TO CHECK BLOOD SUGAR 05/27/16  Yes Riccardo Dubin, MD  ACCU-CHEK SOFTCLIX LANCETS lancets Use as instructed 05/24/15  Yes Riccardo Dubin, MD  aspirin EC 81 MG tablet Take 1 tablet (81 mg total) by mouth daily. 05/12/16 05/12/17 Yes Riccardo Dubin, MD  LANTUS 100 UNIT/ML injection INJECT 20 UNITS INTO THE SKIN AT BEDTIME 06/18/16  Yes Annia Belt, MD  lisinopril-hydrochlorothiazide (PRINZIDE,ZESTORETIC) 20-25 MG per tablet Take 1 tablet by mouth daily. 07/12/15  Yes Riccardo Dubin, MD  metFORMIN (GLUCOPHAGE) 1000  MG tablet TAKE ONE TABLET BY MOUTH TWICE DAILY WITH MEALS 06/18/16  Yes Annia Belt, MD  naproxen (NAPROSYN) 500 MG tablet Take 1 tablet (500 mg total) by mouth 2 (two) times daily with a meal. 05/25/16  Yes Timothy R Draper, DO  omeprazole (PRILOSEC) 20 MG capsule TAKE ONE CAPSULE BY MOUTH ONCE DAILY AS NEEDED 05/24/15  Yes Riccardo Dubin, MD  pravastatin (PRAVACHOL) 40 MG tablet Take 1 tablet (40 mg total) by mouth every evening. 02/07/16 02/06/17 Yes Riccardo Dubin, MD  Canagliflozin-Metformin HCl ER [id number] MG TB24 Take 1 tablet by mouth daily. Patient not taking: Reported on 06/23/2016 05/12/16   Riccardo Dubin, MD   BP 126/84 mmHg  Pulse 67  Temp(Src) 98.3 F (36.8 C) (Oral)  Resp 20  SpO2  100% Physical Exam  Constitutional: She is oriented to person, place, and time. She appears well-developed and well-nourished. No distress.  HENT:  Head: Normocephalic and atraumatic.  Mouth/Throat: No oropharyngeal exudate.  Eyes: Conjunctivae and EOM are normal. Pupils are equal, round, and reactive to light. Right eye exhibits no discharge. Left eye exhibits no discharge. No scleral icterus.  Cardiovascular: Normal rate, regular rhythm, normal heart sounds and intact distal pulses.  Exam reveals no gallop and no friction rub.   No murmur heard. Pulmonary/Chest: Effort normal and breath sounds normal. No respiratory distress. She has no wheezes. She has no rales. She exhibits no tenderness.  Abdominal: Soft. She exhibits no distension. There is no tenderness. There is no guarding.  Musculoskeletal: Normal range of motion. She exhibits no edema.  Neurological: She is alert and oriented to person, place, and time. No cranial nerve deficit. She exhibits normal muscle tone. Coordination normal.  Strength 5/5 throughout. No sensory deficits.  No facial droop. No pronator drift. No slurred speech. No gait abnormality.  Skin: Skin is warm and dry. No rash noted. She is not diaphoretic. No erythema. No pallor.  Psychiatric: She has a normal mood and affect. Her behavior is normal.  Nursing note and vitals reviewed.   ED Course  Procedures (including critical care time) Labs Review Labs Reviewed  CBG MONITORING, ED - Abnormal; Notable for the following:    Glucose-Capillary 144 (*)    All other components within normal limits  COMPREHENSIVE METABOLIC PANEL  CBC WITH DIFFERENTIAL/PLATELET  URINALYSIS, ROUTINE W REFLEX MICROSCOPIC (NOT AT [facility])    Imaging Review No results found. I have personally reviewed and evaluated these images and lab results as part of my medical decision-making.   EKG Interpretation None      MDM   Final diagnoses:  Nonintractable headache, unspecified  chronicity pattern, unspecified headache type   60 y.o F with hx of DM presents to the ED today c.o HA that has been present for >1 month. Pt has hx of similar symptoms. Pt appears well in the ED, in NAD. No neuroligical deficits on exam. BP is stable, 126/84. No stroke like symptoms. No meningismus. Doubt SAH. Pt managed in the ED with Reglan, Benadryl and Toradol. Upon repeat exam, pt states that ehr symptoms have completely resolved and she is requesting discharge. Lab work wnl. No metabolic derangement. K mildly low, repleted in ED. Recommend that pt follow up with PC this week. Pt states she has called an scheduled an appointment for later this week and she appears reliable for follow up. Pt is hemodynamically stable and ready for discharge. Return precautions outlined in patient discharge instructions.  Dondra Spry Imogene, PA-C 06/24/16 2227  Dorie Rank, MD 06/25/16 [phone]

## 2016-06-23 NOTE — Discharge Instructions (Signed)
General Headache Without Cause A headache is pain or discomfort felt around the head or neck area. The specific cause of a headache may not be found. There are many causes and types of headaches. A few common ones are:  Tension headaches.  Migraine headaches.  Cluster headaches.  Chronic daily headaches. HOME CARE INSTRUCTIONS  Watch your condition for any changes. Take these steps to help with your condition: Managing Pain  Take over-the-counter and prescription medicines only as told by your health care provider.  Lie down in a dark, quiet room when you have a headache.  If directed, apply ice to the head and neck area:  Put ice in a plastic bag.  Place a towel between your skin and the bag.  Leave the ice on for 20 minutes, 2-3 times per day.  Use a heating pad or hot shower to apply heat to the head and neck area as told by your health care provider.  Keep lights dim if bright lights bother you or make your headaches worse. Eating and Drinking  Eat meals on a regular schedule.  Limit alcohol use.  Decrease the amount of caffeine you drink, or stop drinking caffeine. General Instructions  Keep all follow-up visits as told by your health care provider. This is important.  Keep a headache journal to help find out what may trigger your headaches. For example, write down:  What you eat and drink.  How much sleep you get.  Any change to your diet or medicines.  Try massage or other relaxation techniques.  Limit stress.  Sit up straight, and do not tense your muscles.  Do not use tobacco products, including cigarettes, chewing tobacco, or e-cigarettes. If you need help quitting, ask your health care provider.  Exercise regularly as told by your health care provider.  Sleep on a regular schedule. Get 7-9 hours of sleep, or the amount recommended by your health care provider. SEEK MEDICAL CARE IF:   Your symptoms are not helped by medicine.  You have a  headache that is different from the usual headache.  You have nausea or you vomit.  You have a fever. SEEK IMMEDIATE MEDICAL CARE IF:   Your headache becomes severe.  You have repeated vomiting.  You have a stiff neck.  You have a loss of vision.  You have problems with speech.  You have pain in the eye or ear.  You have muscular weakness or loss of muscle control.  You lose your balance or have trouble walking.  You feel faint or pass out.  You have confusion.   This information is not intended to replace advice given to you by your health care provider. Make sure you discuss any questions you have with your health care provider.   Follow up with your primary care provider for re-evaluation as soon as possible. Continue home diabetic medications. Return to the ED if you experience severe worsening of your symptoms, blurred vision, weakness in an extremity, neck pain or stiffness, chest pain, shortness of breath, slurred speech, facial drooping.

## 2016-06-23 NOTE — ED Notes (Signed)
patient here with ongoing headache the past 1 week, also states her neuropathy worse than normal. States that her diabetic medications were changed the past month and thinks related to the change. Alert and oriented, NAD

## 2016-06-24 ENCOUNTER — Encounter (HOSPITAL_COMMUNITY): Payer: Self-pay | Admitting: Nurse Practitioner

## 2016-06-24 ENCOUNTER — Emergency Department (HOSPITAL_COMMUNITY)
Admission: EM | Admit: 2016-06-24 | Discharge: 2016-06-24 | Disposition: A | Discharge status: Home / Self Care | Payer: Medicaid Other | Attending: Emergency Medicine | Admitting: Emergency Medicine

## 2016-06-24 DIAGNOSIS — F17210 Nicotine dependence, cigarettes, uncomplicated: Secondary | ICD-10-CM | POA: Insufficient documentation

## 2016-06-24 DIAGNOSIS — I10 Essential (primary) hypertension: Secondary | ICD-10-CM | POA: Insufficient documentation

## 2016-06-24 DIAGNOSIS — Z794 Long term (current) use of insulin: Secondary | ICD-10-CM | POA: Insufficient documentation

## 2016-06-24 DIAGNOSIS — E785 Hyperlipidemia, unspecified: Secondary | ICD-10-CM | POA: Insufficient documentation

## 2016-06-24 DIAGNOSIS — Z7982 Long term (current) use of aspirin: Secondary | ICD-10-CM | POA: Insufficient documentation

## 2016-06-24 DIAGNOSIS — M1990 Unspecified osteoarthritis, unspecified site: Secondary | ICD-10-CM | POA: Diagnosis not present

## 2016-06-24 DIAGNOSIS — E119 Type 2 diabetes mellitus without complications: Secondary | ICD-10-CM | POA: Diagnosis not present

## 2016-06-24 DIAGNOSIS — Z7984 Long term (current) use of oral hypoglycemic drugs: Secondary | ICD-10-CM | POA: Diagnosis not present

## 2016-06-24 DIAGNOSIS — M79602 Pain in left arm: Secondary | ICD-10-CM | POA: Insufficient documentation

## 2016-06-24 LAB — I-STAT TROPONIN, ED: TROPONIN I, POC: 0 ng/mL (ref 0.00–0.08)

## 2016-06-24 MED ORDER — HYDROCODONE-ACETAMINOPHEN 5-325 MG PO TABS: 1.0000 | ORAL_TABLET | ORAL | Status: DC | PRN

## 2016-06-24 MED ORDER — HYDROCODONE-ACETAMINOPHEN 5-325 MG PO TABS
2.0000 | ORAL_TABLET | Freq: Once | ORAL | Status: AC
  Administered 2016-06-24: 2 via ORAL
  Filled 2016-06-24: qty 2

## 2016-06-24 NOTE — ED Notes (Signed)
Bed: [id number] Expected date:  Expected time:  Means of arrival:  Comments: EMS-left arm pain/HTN

## 2016-06-24 NOTE — ED Notes (Signed)
Pt c/o left arm pain beginning @ the [facility] & radiating into shoulder.  Pt stated "it hurts to move it".

## 2016-06-24 NOTE — ED Provider Notes (Signed)
CSN: [id number]     Arrival date & time 06/24/16  1810 History   First MD Initiated Contact with Patient 06/24/16 1914     Chief Complaint  Patient presents with  . Arm Pain    left     (Consider location/radiation/quality/duration/timing/severity/associated sxs/prior Treatment) HPI Comments: Patient is a 60 year old female with history of diabetes who presents with left arm pain. Patient reports her pain around her elbow, but not her elbow with radiation sometimes to her forearm and shoulder. The patient states that she has been experiencing the pain for the past few months, however it has become unbearable today. Patient describes her pain as a "deep, muscle, nerve, dull pain with intermittent shooting. She states that is worsened with with certain movements. Patient was seen by sports medicine a couple weeks ago and was treated with naproxen without relief. Patient denies any chest pain, shortness of breath, abdominal pain, nausea, vomiting, dysuria.  Patient is a 60 y.o. female presenting with arm pain. The history is provided by the patient.  Arm Pain Associated symptoms include arthralgias and myalgias. Pertinent negatives include no abdominal pain, chest pain, chills, fever, headaches, nausea, rash, sore throat or vomiting.    Past Medical History  Diagnosis Date  . Atherosclerotic peripheral vascular disease with intermittent claudication ([place])     s/p fem-fem bypass 2011. ABI 07/2012 0.5 R 0.65 L  . Hypertension   . GERD (gastroesophageal reflux disease)   . Tobacco abuse   . Allergy   . Anxiety     occasional  . Arthritis   . Hyperlipidemia   . Personal history of colonic polyps - adenomas 03/07/2014  . Diabetes mellitus without complication [facility])    Past Surgical History  Procedure Laterality Date  . Vein bypass surgery  06/2010    vein from left to right hip  . Vaginal hysterectomy     Family History  Problem Relation Age of Onset  . Diabetes Mother   . Diabetes  Father   . Diabetes Sister   . Hypertension Sister   . Diabetes Brother   . Hypertension Brother   . Colon cancer Neg Hx   . Esophageal cancer Neg Hx   . Stomach cancer Neg Hx   . Rectal cancer Neg Hx    Social History  Substance Use Topics  . Smoking status: Current Every Day Smoker -- 0.30 packs/day    Types: Cigarettes  . Smokeless tobacco: Never Used     Comment: 4-6 cigs in a week 2-7 per day  . Alcohol Use: 0.0 oz/week    1-2 Glasses of wine per week     Comment: Occasionally   OB History    No data available     Review of Systems  Constitutional: Negative for fever and chills.  HENT: Negative for facial swelling and sore throat.   Respiratory: Negative for shortness of breath.   Cardiovascular: Negative for chest pain.  Gastrointestinal: Negative for nausea, vomiting and abdominal pain.  Genitourinary: Negative for dysuria.  Musculoskeletal: Positive for myalgias and arthralgias. Negative for back pain.  Skin: Negative for rash and wound.  Neurological: Negative for headaches.  Psychiatric/Behavioral: The patient is not nervous/anxious.       Allergies  Metronidazole and Shellfish allergy  Home Medications   Prior to Admission medications   Medication Sig Start Date End Date Taking? Authorizing Provider  aspirin EC 81 MG tablet Take 1 tablet (81 mg total) by mouth daily. 05/12/16 05/12/17 Yes [name] Sherrye Payor,  MD  LANTUS 100 UNIT/ML injection INJECT 20 UNITS INTO THE SKIN AT BEDTIME Patient taking differently: INJECT 21 UNITS INTO THE SKIN AT BEDTIME 06/18/16  Yes Annia Belt, MD  lisinopril-hydrochlorothiazide (PRINZIDE,ZESTORETIC) 20-25 MG per tablet Take 1 tablet by mouth daily. 07/12/15  Yes Riccardo Dubin, MD  metFORMIN (GLUCOPHAGE) 1000 MG tablet TAKE ONE TABLET BY MOUTH TWICE DAILY WITH MEALS 06/18/16  Yes Annia Belt, MD  naproxen (NAPROSYN) 500 MG tablet Take 1 tablet (500 mg total) by mouth 2 (two) times daily with a meal. 05/25/16  Yes  Timothy R Draper, DO  omeprazole (PRILOSEC) 20 MG capsule TAKE ONE CAPSULE BY MOUTH ONCE DAILY AS NEEDED 05/24/15  Yes Riccardo Dubin, MD  pravastatin (PRAVACHOL) 40 MG tablet Take 1 tablet (40 mg total) by mouth every evening. 02/07/16 02/06/17 Yes [name] Sherrye Payor, MD  ACCU-CHEK AVIVA PLUS test strip USE 3 TO 4 TIMES DAILY TO CHECK BLOOD SUGAR 05/27/16   Riccardo Dubin, MD  ACCU-CHEK SOFTCLIX LANCETS lancets Use as instructed 05/24/15   Riccardo Dubin, MD  Canagliflozin-Metformin HCl ER [id number] MG TB24 Take 1 tablet by mouth daily. Patient not taking: Reported on 06/23/2016 05/12/16   Riccardo Dubin, MD  HYDROcodone-acetaminophen (NORCO/VICODIN) 5-325 MG tablet Take 1-2 tablets by mouth every 4 (four) hours as needed. 06/24/16   Saliha Salts M Isaiyah Feldhaus, PA-C   BP 114/65 mmHg  Pulse 52  Temp(Src) 97.3 F (36.3 C) (Oral)  Resp 14  Ht 5\' 5"  (1.651 m)  Wt 65.318 kg  BMI 23.96 kg/m2  SpO2 100% Physical Exam  Constitutional: She appears well-developed and well-nourished. No distress.  HENT:  Head: Normocephalic and atraumatic.  Mouth/Throat: Oropharynx is clear and moist. No oropharyngeal exudate.  Eyes: Conjunctivae are normal. Pupils are equal, round, and reactive to light. Right eye exhibits no discharge. Left eye exhibits no discharge. No scleral icterus.  Neck: Normal range of motion. Neck supple. No thyromegaly present.  Cardiovascular: Normal rate, regular rhythm, normal heart sounds and intact distal pulses.  Exam reveals no gallop and no friction rub.   No murmur heard. Pulmonary/Chest: Effort normal and breath sounds normal. No stridor. No respiratory distress. She has no wheezes. She has no rales.  Abdominal: Soft. Bowel sounds are normal. She exhibits no distension. There is no tenderness. There is no rebound and no guarding.  Musculoskeletal: She exhibits no edema.       Left elbow: She exhibits normal range of motion.       Arms: Lymphadenopathy:    She has no cervical adenopathy.    Neurological: She is alert. Coordination normal.  Skin: Skin is warm and dry. No rash noted. She is not diaphoretic. No pallor.  Psychiatric: She has a normal mood and affect.  Nursing note and vitals reviewed.   ED Course  Procedures (including critical care time) [place], ED    Imaging Review No results found. I have personally reviewed and evaluated these images and lab results as part of my medical decision-making.   EKG Interpretation   Date/Time:  Wednesday June 24 2016 18:38:54 EDT Ventricular Rate:  54 PR Interval:    QRS Duration: 84 QT Interval:  447 QTC Calculation: 424 R Axis:   14 Text Interpretation:  Sinus rhythm since last tracing no significant  change Confirmed by Eulis Foster  MD, Vira Agar [id number]) on 06/24/2016 7:42:57 PM      MDM   Patient presenting with left arm pain for  several months. Patient diagnosed with left rotator cuff impingement by her sports medicine doctor. Her symptoms are consistent with this diagnosis. Patient is neurovascularly intact, has 5/5 strength, and equal bilateral grip strength. Myself and Dr. Eulis Foster suspect muscloskeletal etiology. Patient given 2 Norco in ED and discharged with short course of Norco for severe with follow-up with sports medicine. Based on Dr. Alinda Money note, patient was supposed to follow-up in 3 weeks if no better with naproxen. Patient did not. Patient was offered cortisone injection at that time and declined and it was suggested that she reconsider if symptoms continued. Troponin 0.0. Patient understands and agrees with plan. Patient vitals stable throughout ED course and discharged in satisfactory condition. Patient also evaluated by Dr. Eulis Foster who is in agreement with plan.  Final diagnoses:  Left arm pain        Frederica Kuster, Hershal Coria 06/24/16 2108  Daleen Bo, MD 06/26/16 [phone]

## 2016-06-24 NOTE — ED Notes (Signed)
Patient presents to [facility] today for complaints of sharp,stabbing pain in her left arm rated as a 10/10. Localizes to elbow area. Was seen at [facility] yesterday. Took tylenol earlier today without relief.

## 2016-06-24 NOTE — Discharge Instructions (Signed)
Medications: Norco  Treatment: Take 1-2 Norco every 4 hours as needed for severe pain. Continue taking naproxen as prescribed. Use moist heat 3-4 times daily alternating 20 minutes on, 20 minutes off. Your symptoms do correlate with your rotator cuff impingement that was diagnosed by your sports medicine doctor.  Follow-up: Please follow-up with your sports medicine doctor as soon as possible for further evaluation and treatment. Please return to emergency department if you develop any new or worsening symptoms.

## 2016-06-24 NOTE — ED Provider Notes (Signed)
  Face-to-face evaluation   History: She presents for evaluation of left arm pain, associated with left shoulder pain, ongoing, despite current treatment with Naprosyn. She was seen by sports medicine, and diagnosed with impingement syndrome. She was offered injection but declined it. No recent trauma. No neck symptoms.  Physical exam: Alert, calm, cooperative. Normal motion left arm and neck. Mild tenderness anterior left shoulder. No left shoulder deformity.  Medical screening examination/treatment/procedure(s) were conducted as a shared visit with non-physician practitioner(s) and myself.  I personally evaluated the patient during the encounter  Daleen Bo, MD 06/26/16 1222

## 2016-06-26 ENCOUNTER — Encounter: Payer: Self-pay | Admitting: Internal Medicine

## 2016-06-26 ENCOUNTER — Ambulatory Visit (INDEPENDENT_AMBULATORY_CARE_PROVIDER_SITE_OTHER): Payer: Medicaid Other | Admitting: Internal Medicine

## 2016-06-26 ENCOUNTER — Ambulatory Visit (INDEPENDENT_AMBULATORY_CARE_PROVIDER_SITE_OTHER): Payer: Medicaid Other | Admitting: Dietician

## 2016-06-26 VITALS — BP 140/64 | HR 64 | Temp 98.4°F | Ht 65.0 in | Wt 146.8 lb

## 2016-06-26 DIAGNOSIS — IMO0002 Reserved for concepts with insufficient information to code with codable children: Secondary | ICD-10-CM

## 2016-06-26 DIAGNOSIS — Z794 Long term (current) use of insulin: Secondary | ICD-10-CM

## 2016-06-26 DIAGNOSIS — M75102 Unspecified rotator cuff tear or rupture of left shoulder, not specified as traumatic: Secondary | ICD-10-CM | POA: Diagnosis not present

## 2016-06-26 DIAGNOSIS — K219 Gastro-esophageal reflux disease without esophagitis: Secondary | ICD-10-CM

## 2016-06-26 DIAGNOSIS — E1165 Type 2 diabetes mellitus with hyperglycemia: Secondary | ICD-10-CM

## 2016-06-26 DIAGNOSIS — M7542 Impingement syndrome of left shoulder: Secondary | ICD-10-CM

## 2016-06-26 LAB — GLUCOSE, CAPILLARY: GLUCOSE-CAPILLARY: 257 mg/dL — AB (ref 65–99)

## 2016-06-26 MED ORDER — OMEPRAZOLE 20 MG PO CPDR: DELAYED_RELEASE_CAPSULE | ORAL | Status: DC

## 2016-06-26 MED ORDER — SITAGLIPTIN PHOSPHATE 100 MG PO TABS: 100.0000 mg | ORAL_TABLET | Freq: Every day | ORAL | Status: DC

## 2016-06-26 NOTE — Progress Notes (Signed)
Diabetes Self-Management Education  Visit Type:  Follow-up  Appt. Start Time: 1400 Appt. End Time: 1500  06/26/2016  Ms. [name], identified by name and date of birth, is a 60 y.o. female with a diagnosis of Diabetes:  Marland Kitchen  Type 2 diabetes ASSESSMENT Spouse came to visit when she was seeing doctor There were no vitals taken for this visit. There is no weight on file to calculate BMI.       Diabetes Self-Management Education - 06/26/16 1600    Individualized Goals (developed by patient)   Problem Solving --  consider support group   Health Coping ask for help with diabetes when not controlled the way you want      Learning Objective:  Patient will have a greater understanding of diabetes self-management. Patient education plan is psychological support and developing strategies to support self care  My plan to support myself in continuing these changes to care for my diabetes is to attend or contact:   Diabetes Support Groups Type 2 diabetes support group : 2nd Monday of every month from 6-7 PM at [address] [facility] conference room [phone] Other  local support resources -doctor's office, CDE, Dietitian, pharmacist, church   Plan:   There are no Patient Instructions on file for this visit.   Expected Outcomes:  Demonstrated interest in learning. Expect positive outcomes  Education material provided: Support group flyer  If problems or questions, patient to contact team via:  Phone  Future DSME appointment: - 3-4 months

## 2016-06-26 NOTE — Patient Instructions (Addendum)
STOP Invokanamet.   START Januvia 1 tablet daily with breakfast.   CONTINUE metformin 1000mg  twice daily and Lantus 20-22 units at bedtime.   For shoulder injections, please come back next Friday, July 21st, or Friday, August 4th.

## 2016-06-26 NOTE — Telephone Encounter (Signed)
Called Angie Cain to follow up on her diabetes self care and whether she wants to use an insulin pen instead of vial and syringe. She decided that because she has so many other things going on and her blood sugars feels to of control that she should schedule appointments with Dr. Posey Pronto and myself. Her call was transferred to the front office for assistance.

## 2016-06-26 NOTE — Assessment & Plan Note (Signed)
Assessment She was seen by sports medicine on 05/25/16 and offered joint injection which she politely declined. She also did deferred scheduling physical therapy though has been taking naproxen 500 mg twice daily. She was prescribed hydrocodone/acetaminophen 5/325 mg tablets to be taken 1-2 tablets every 4 hours as needed after she left the emergency department 06/24/16. Her husband is concerned about the pain she is experiencing would like for some kind of relief.  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

## 2016-06-26 NOTE — Telephone Encounter (Signed)
Thank you for the heads up!

## 2016-06-26 NOTE — Progress Notes (Signed)
   Subjective:    Patient ID: Angie Cain, female    DOB: 1956-10-09, 60 y.o.   MRN: [id number]  HPI Angie Cain is a 60 year old female who presents today for diabetes. Please see assessment & plan for status of chronic medical problems.    Review of Systems     Objective:   Physical Exam  Constitutional: She is oriented to person, place, and time. She appears well-developed and well-nourished. No distress.  HENT:  Head: Normocephalic and atraumatic.  Mouth/Throat: Oropharynx is clear and moist.  Eyes: Conjunctivae are normal. No scleral icterus.  Neck: No tracheal deviation present.  Pulmonary/Chest: Effort normal. No stridor. No respiratory distress.  Musculoskeletal:  Pain elicited with active range of motion when left shoulder is extended backwards. Difficulty with left shoulder adduction when asked to place the arm behind her back through ability to pronate intact.  Neurological: She is alert and oriented to person, place, and time.  Skin: Skin is warm and dry. She is not diaphoretic.          Assessment & Plan:

## 2016-06-26 NOTE — Assessment & Plan Note (Addendum)
Assessment Review of her blood sugar logs is notable for an increased average CBG 203 for the past month as compared to average CBG 170 if the last 3 months are incorporated. In the last month, it appears she has been increased her alcohol intake which is evident on the logs by sugars in the 200s on Fridays with her highest recorded value 527 on Friday, 6/30.  She also stopped taking canagliflozin/metformin because she attributes the combination medication to her multiple ED visits this past week as well as other symptoms, like rash, decreased appetite, and pruritis. She also acknowledges increased stress at home for which she has been using dietary indiscretion as a coping mechanism and resolves to do better.  Her husband joined the visit near the end and was interested in how he could support her.  Plan -Counseled the patient and her husband on optimal glycemic control [A1c 7%] and the risks associated with persistently elevated blood sugars, like retinopathy, nephropathy, neuropathy -Requested clinic diabetic educator to provide patient with resources on meal planning and caregiver support -Start sitagliptin 100 mg daily -Continue metformin 1000 mg twice daily -Continue Lantus 20-22 units at bedtime -Follow-up in 1 month to recheck A1c

## 2016-06-26 NOTE — Assessment & Plan Note (Signed)
Assessment She is requesting a refill of her omeprazole 20 mg to be taken once daily as needed 30 minutes prior to meal. Her husband does acknowledge that medications like naproxen do seem to irritate her stomach.  Plan -Prescribed omeprazole 20 mg to be taken once daily as needed 30 minutes prior to meals

## 2016-06-29 ENCOUNTER — Ambulatory Visit: Payer: Medicaid Other

## 2016-06-30 ENCOUNTER — Ambulatory Visit (INDEPENDENT_AMBULATORY_CARE_PROVIDER_SITE_OTHER): Payer: Medicaid Other | Admitting: Internal Medicine

## 2016-06-30 ENCOUNTER — Encounter: Payer: Self-pay | Admitting: Internal Medicine

## 2016-06-30 VITALS — BP 148/67 | HR 68 | Temp 98.1°F | Ht 65.0 in | Wt 145.3 lb

## 2016-06-30 DIAGNOSIS — M75102 Unspecified rotator cuff tear or rupture of left shoulder, not specified as traumatic: Secondary | ICD-10-CM | POA: Diagnosis present

## 2016-06-30 DIAGNOSIS — M7542 Impingement syndrome of left shoulder: Secondary | ICD-10-CM | POA: Diagnosis not present

## 2016-06-30 NOTE — Assessment & Plan Note (Signed)
Assessment Left shoulder joint with notable signs of impingement syndrome  Plan  Shoulder Injection After discussion of the risks, benefits and alternative therapies available, the patient elected to proceed. After obtaining informed consent, the patient's identity, procedure, and site were verified during a pause prior to proceding with the minor surgical procedure as per universal protocol recommendations.    Arm was placed in a comfortable dependant position dangling at side with patient sitting comfortably. Entry site for injection of the subdeltoid bursa was identified by palpation and marked with tip of closed pen. Betadine scrub was used for prep. Using a 27-gauge needle, the mixture of lidocaine and Kenalog was easily inserted into the joint. The patient had mild to moderate relief of symptoms immediately post procedure indicating proper placement. There were no complications. Patient tolerated the procedure well.

## 2016-06-30 NOTE — Progress Notes (Signed)
   Subjective:    Patient ID: Angie Cain, female    DOB: 06/08/1956, 60 y.o.   MRN: [id number]  HPI Angie Cain is a 60 year old female who presents today for left shoulder injection. Please see assessment & plan for status of chronic medical problems.   Review of Systems     Objective:   Physical Exam  Constitutional: She appears well-developed and well-nourished.  Musculoskeletal:  No gross deformities or lesions visualized overlying the left shoulder. Pain elicited with backwards range of motion, external rotation.          Assessment & Plan:

## 2016-06-30 NOTE — Patient Instructions (Signed)
Please remember your shoulder may hurt a bit in 1-3 days after the injection before it gets better as the steroid takes a few days to activate.  If your pain gets worse or you notice warmth, swelling, please come back and see [place].

## 2016-06-30 NOTE — Progress Notes (Signed)
Internal Medicine Clinic Attending  Case discussed with Dr. Patel,[name] at the time of the visit.  We reviewed the resident's history and exam and pertinent patient test results.  I agree with the assessment, diagnosis, and plan of care documented in the resident's note.  

## 2016-07-01 NOTE — Progress Notes (Signed)
Internal Medicine Clinic Attending  I saw and evaluated the patient.  I personally confirmed the key portions of the history and exam documented by Dr. Charlott Rakes and I reviewed pertinent patient test results.  The assessment, diagnosis, and plan were formulated together and I agree with the documentation in the resident's note. I was present for the entire procedure. The subacromial bursa was injected with 40mg  of kenalog and 3 cc of 1% lidocaine using the lateral approach.

## 2016-07-01 NOTE — Addendum Note (Signed)
Addended by: Joni Reining C on: 07/01/2016 10:30 AM   Modules accepted: Level of Service

## 2016-07-09 LAB — HM DIABETES EYE EXAM

## 2016-07-16 ENCOUNTER — Other Ambulatory Visit: Payer: Self-pay | Admitting: Internal Medicine

## 2016-07-16 DIAGNOSIS — I10 Essential (primary) hypertension: Secondary | ICD-10-CM

## 2016-07-16 NOTE — Telephone Encounter (Signed)
As this patient was seen by me and deemed necessary for their treatment, I will refill this prescription for lisinopril/HCTZ.

## 2016-07-25 ENCOUNTER — Other Ambulatory Visit: Payer: Self-pay | Admitting: Internal Medicine

## 2016-07-25 DIAGNOSIS — Z794 Long term (current) use of insulin: Principal | ICD-10-CM

## 2016-07-25 DIAGNOSIS — E1165 Type 2 diabetes mellitus with hyperglycemia: Secondary | ICD-10-CM

## 2016-07-25 DIAGNOSIS — IMO0002 Reserved for concepts with insufficient information to code with codable children: Secondary | ICD-10-CM

## 2016-07-27 ENCOUNTER — Encounter: Payer: Self-pay | Admitting: *Deleted

## 2016-07-27 NOTE — Telephone Encounter (Signed)
As this patient was seen by me and deemed necessary for their treatment, I will refill this prescription for sitagliptin.  

## 2016-07-31 ENCOUNTER — Encounter: Payer: Self-pay | Admitting: Internal Medicine

## 2016-07-31 ENCOUNTER — Encounter (INDEPENDENT_AMBULATORY_CARE_PROVIDER_SITE_OTHER): Payer: Self-pay

## 2016-07-31 ENCOUNTER — Ambulatory Visit: Payer: Medicaid Other

## 2016-07-31 ENCOUNTER — Ambulatory Visit (INDEPENDENT_AMBULATORY_CARE_PROVIDER_SITE_OTHER): Payer: Medicaid Other | Admitting: Internal Medicine

## 2016-07-31 VITALS — BP 127/74 | HR 78 | Temp 98.4°F | Ht 65.0 in | Wt 144.5 lb

## 2016-07-31 DIAGNOSIS — E11649 Type 2 diabetes mellitus with hypoglycemia without coma: Secondary | ICD-10-CM

## 2016-07-31 DIAGNOSIS — H5789 Other specified disorders of eye and adnexa: Secondary | ICD-10-CM | POA: Insufficient documentation

## 2016-07-31 DIAGNOSIS — Z794 Long term (current) use of insulin: Secondary | ICD-10-CM | POA: Diagnosis not present

## 2016-07-31 DIAGNOSIS — IMO0002 Reserved for concepts with insufficient information to code with codable children: Secondary | ICD-10-CM

## 2016-07-31 DIAGNOSIS — E1165 Type 2 diabetes mellitus with hyperglycemia: Secondary | ICD-10-CM

## 2016-07-31 DIAGNOSIS — H578 Other specified disorders of eye and adnexa: Secondary | ICD-10-CM | POA: Diagnosis not present

## 2016-07-31 LAB — GLUCOSE, CAPILLARY: Glucose-Capillary: 81 mg/dL (ref 65–99)

## 2016-07-31 LAB — POCT GLYCOSYLATED HEMOGLOBIN (HGB A1C): Hemoglobin A1C: 9.5

## 2016-07-31 MED ORDER — BENZONATATE 100 MG PO CAPS: 100.0000 mg | ORAL_CAPSULE | Freq: Three times a day (TID) | ORAL | 0 refills | Status: DC | PRN

## 2016-07-31 NOTE — Progress Notes (Signed)
   CC: Eye discharge  HPI:  Angie Cain is a 60 y.o. female who presents today for eye discharge. Please see assessment & plan for status of chronic medical problems.   Past Medical History:  Diagnosis Date  . Allergy   . Anxiety    occasional  . Arthritis   . Atherosclerotic peripheral vascular disease with intermittent claudication ([place])    s/p fem-fem bypass 2011. ABI 07/2012 0.5 R 0.65 L  . Diabetes mellitus without complication ([place])   . GERD (gastroesophageal reflux disease)   . Hyperlipidemia   . Hypertension   . Personal history of colonic polyps - adenomas 03/07/2014  . Tobacco abuse     Review of Systems:  Please see each problem below for a pertinent review of systems.  Physical Exam:  Vitals:   07/31/16 1427  BP: 127/74  Pulse: 78  Temp: 98.4 F (36.9 C)  TempSrc: Oral  SpO2: 100%  Weight: 144 lb 8 oz (65.5 kg)  Height: 5\' 5"  (1.651 m)   Physical Exam  Constitutional: She is oriented to person, place, and time. No distress.  HENT:  Head: Normocephalic and atraumatic.  Posterior oropharyngeal erythema  Eyes: Conjunctivae and EOM are normal. Right eye exhibits no discharge. Left eye exhibits no discharge. No scleral icterus.  Neck:  Posterior oropharyngeal streaking noted  Cardiovascular: Normal rate and regular rhythm.   Pulmonary/Chest: Effort normal. No respiratory distress.  Lymphadenopathy:    She has no cervical adenopathy.  Neurological: She is alert and oriented to person, place, and time.  Skin: Skin is warm and dry. She is not diaphoretic.         Assessment & Plan:   See Encounters Tab for problem based charting.  Patient discussed with Dr. Lynnae January

## 2016-07-31 NOTE — Progress Notes (Deleted)
Subjective:     Patient ID: Angie Cain, female   DOB: 1956-03-20, 60 y.o.   MRN: [id number]  HPI   Review of Systems     Objective:   Physical Exam     Assessment:     ***    Plan:     ***

## 2016-07-31 NOTE — Patient Instructions (Signed)
It sounds like you might have picked up a virus and just have to let it run its course. I will send over some cough medicine to help.  Until you are eating better, please take only 10 units of Lantus instead of 20 units.  If you find things are not getting better, please call us back.

## 2016-08-02 MED ORDER — INSULIN GLARGINE 100 UNIT/ML ~~LOC~~ SOLN: 10.0000 [IU] | Freq: Every day | SUBCUTANEOUS | 1 refills | Status: DC

## 2016-08-02 NOTE — Assessment & Plan Note (Signed)
Assessment Review of her blood sugar log is notable for readings under 100 beginning on 07/22/16. Her lowest values are of concern and fall to the 60s to 70s. During this interval, she acknowledges taking glargine 20 units at bedtime along with sitagliptin 100 mg daily and metformin 1000 mg twice daily. She has not had a great appetite.  Plan -Recommended she decrease glargine to 10 units at bedtime until she is tolerating oral intake -Check A1c today  ADDENDUM 08/02/2016  9:59 PM:  A1c 9.5, up from 8.5 back in May 2017.

## 2016-08-02 NOTE — Assessment & Plan Note (Signed)
Assessment After her eye doctor's appointment on 07/09/16, she reported the acute onset of greenish eye discharge that is most prominent in the morning upon awakening. She was unable to give me an exact interval over which the symptoms have persisted though she associates them with a dry cough, headache, chills, sore throat, vomiting. She denies any sick contacts, recent eye injury, wearing contact lens, blurry vision, histaminergic symptoms [otorrhea, rhinorrhea, nasal itching]. She does not feel well though mentions to me that she has not had any eye discharge this morning.  Physical exam appears reassuring for no acute bacterial conjunctivitis. At this point, I feel she either has viral conjunctivitis or allergic conjunctivitis. I will treat her for her symptoms with over-the-counter medications. I suspect her vomiting is likely related to her blood sugars which I have reviewed under diabetes.  Plan -Prescribed benzonatate 100 mg 20 tablets to be taken 3 times daily as needed given that any syrup medication would elevate her blood sugars

## 2016-08-03 NOTE — Progress Notes (Signed)
Internal Medicine Clinic Attending  Case discussed with Dr. Patel soon after the resident saw the patient.  We reviewed the resident's history and exam and pertinent patient test results.  I agree with the assessment, diagnosis, and plan of care documented in the resident's note. 

## 2016-09-07 ENCOUNTER — Encounter: Payer: Self-pay | Admitting: Internal Medicine

## 2016-09-07 ENCOUNTER — Telehealth: Payer: Self-pay | Admitting: *Deleted

## 2016-09-07 ENCOUNTER — Ambulatory Visit (INDEPENDENT_AMBULATORY_CARE_PROVIDER_SITE_OTHER): Payer: Medicaid Other | Admitting: Dietician

## 2016-09-07 ENCOUNTER — Ambulatory Visit (INDEPENDENT_AMBULATORY_CARE_PROVIDER_SITE_OTHER): Payer: Medicaid Other | Admitting: Internal Medicine

## 2016-09-07 VITALS — BP 134/77 | HR 64 | Temp 99.1°F | Ht 65.0 in | Wt 143.8 lb

## 2016-09-07 DIAGNOSIS — J208 Acute bronchitis due to other specified organisms: Secondary | ICD-10-CM

## 2016-09-07 DIAGNOSIS — E119 Type 2 diabetes mellitus without complications: Secondary | ICD-10-CM | POA: Diagnosis not present

## 2016-09-07 DIAGNOSIS — R05 Cough: Secondary | ICD-10-CM | POA: Diagnosis not present

## 2016-09-07 DIAGNOSIS — Z794 Long term (current) use of insulin: Secondary | ICD-10-CM | POA: Diagnosis not present

## 2016-09-07 DIAGNOSIS — IMO0002 Reserved for concepts with insufficient information to code with codable children: Secondary | ICD-10-CM

## 2016-09-07 DIAGNOSIS — F17210 Nicotine dependence, cigarettes, uncomplicated: Secondary | ICD-10-CM

## 2016-09-07 DIAGNOSIS — E1165 Type 2 diabetes mellitus with hyperglycemia: Secondary | ICD-10-CM

## 2016-09-07 DIAGNOSIS — J3489 Other specified disorders of nose and nasal sinuses: Secondary | ICD-10-CM

## 2016-09-07 DIAGNOSIS — M791 Myalgia: Secondary | ICD-10-CM | POA: Diagnosis not present

## 2016-09-07 DIAGNOSIS — Z713 Dietary counseling and surveillance: Secondary | ICD-10-CM

## 2016-09-07 LAB — GLUCOSE, CAPILLARY: GLUCOSE-CAPILLARY: 251 mg/dL — AB (ref 65–99)

## 2016-09-07 MED ORDER — ACCU-CHEK FASTCLIX LANCETS MISC: 10 refills | Status: DC

## 2016-09-07 MED ORDER — ACCU-CHEK AVIVA PLUS W/DEVICE KIT: PACK | 1 refills | Status: DC

## 2016-09-07 MED ORDER — HYDROCODONE-HOMATROPINE 5-1.5 MG/5ML PO SYRP: 5.0000 mL | ORAL_SOLUTION | Freq: Four times a day (QID) | ORAL | 0 refills | Status: DC | PRN

## 2016-09-07 NOTE — Assessment & Plan Note (Signed)
Put in RX For glucometer per [employer].

## 2016-09-07 NOTE — Assessment & Plan Note (Signed)
Her symptoms are most consistent with viral bronchitis. No fever, normal lung exam, had sick contact, has other viral symptoms such as runny nose and sinus pressure.  Will do symptomatic management.  Will give her hycodan for cough. Continue to do salt water gargles. Tylenol prn for pain. Gave instructions to return if cough is not improving over the next few weeks. Did explain that sometimes the cough can last upto 1 month with viral bronchitis.

## 2016-09-07 NOTE — Telephone Encounter (Signed)
Agree with appt, Thanks

## 2016-09-07 NOTE — Telephone Encounter (Signed)
Pt ask to see triage after seeing donnap. Today, states she has a cough, congestion for a few weeks now and needs to see doctor, denies fevers, positive for cough, congestion, stress incont w/ cough which is very irritating, waking up at night coughing. appt at 1445 with dr Genene Churn

## 2016-09-07 NOTE — Patient Instructions (Signed)
Take Hycodan for cough.  sometimes the cough can last upto 1 month with viral bronchitis.   Call us if you are not feeling any better after next 1-2 weeks.   Follow up with your primary doctor for diabetes.

## 2016-09-07 NOTE — Progress Notes (Signed)
   CC: cough  HPI:  [name] is a 60 y.o. DM II uncontrolled and other PMH as listed below here for cough.   Past Medical History:  Diagnosis Date  . Allergy   . Anxiety    occasional  . Arthritis   . Atherosclerotic peripheral vascular disease with intermittent claudication ([place])    s/p fem-fem bypass 2011. ABI 07/2012 0.5 R 0.65 L  . Diabetes mellitus without complication ([place])   . GERD (gastroesophageal reflux disease)   . Hyperlipidemia   . Hypertension   . Personal history of colonic polyps - adenomas 03/07/2014  . Tobacco abuse    Cough started 1 week ago, had a baby who was sick in the house and after that she, her husband, and her kids all were sick. Having runny nose, some throat irritation, some sinus pressure, and cough with white sputum production. No fever, no chills. Had some muscle aches. Has tried tessalon pearls without much improvement. Very bothered by the cough and wants something to help.   Review of Systems:   Review of Systems  Constitutional: Negative for chills, fever and weight loss.  HENT: Negative for congestion, ear discharge, ear pain and sore throat.   Respiratory: Positive for cough and sputum production. Negative for shortness of breath and wheezing.   Cardiovascular: Negative for chest pain.  Musculoskeletal: Positive for myalgias.  Neurological: Negative for dizziness, weakness and headaches.     Physical Exam:  Vitals:   09/07/16 1453  BP: 134/77  Pulse: 64  Temp: 99.1 F (37.3 C)  TempSrc: Oral  SpO2: 100%  Weight: 143 lb 12.8 oz (65.2 kg)  Height: 5\' 5"  (1.651 m)   Physical Exam  Constitutional: She appears well-developed and well-nourished. No distress.  HENT:  Mild erythema on posterior oropharynx. No exudates.  Eyes: Conjunctivae are normal.  Cardiovascular: Exam reveals no gallop and no friction rub.   No murmur heard. Respiratory: Effort normal and breath sounds normal. No respiratory distress. She has no  wheezes. She has no rales.  No increased WOB.   Skin: She is not diaphoretic.    Assessment & Plan:   See Encounters Tab for problem based charting.  Patient discussed with Dr. Angelia Mould

## 2016-09-07 NOTE — Progress Notes (Signed)
Internal Medicine Clinic Attending  Case discussed with Dr. Ahmed at the time of the visit.  We reviewed the resident's history and exam and pertinent patient test results.  I agree with the assessment, diagnosis, and plan of care documented in the resident's note. 

## 2016-09-07 NOTE — Progress Notes (Signed)
Diabetes Self-Management Education  Visit Type:  Follow-up  Appt. Start Time: 135 Appt. End Time: 205  09/07/2016  [name], identified by name and date of birth, is a 60 y.o. female with a diagnosis of Diabetes:   Type 2 .   ASSESSMENT  Angie Cain's meter stopped working 2 weeks ago and she has not checked her blood sugar since. She has strips at home for the Accu chek Aviva plus meter. She verbalizes comprehension of self care knowledge and feels she needs to get back in touch with her counselor to assist her with her home issues and mental health. She plans to do so.    Weight 142 lb 6.4 oz (64.6 kg). Body mass index is 23.7 kg/m.       Diabetes Self-Management Education - 09/07/16 1400      Health Coping   How would you rate your overall health? Fair     Psychosocial Assessment   Patient Belief/Attitude about Diabetes Motivated to manage diabetes   Self-care barriers Lack of material resources;Other (comment)   Self-management support Doctor's office;CDE visits   Patient Concerns Support   Special Needs None   Preferred Learning Style No preference indicated   Learning Readiness Ready     Complications   Last HgB A1C per patient/outside source 9.5 %   How often do you check your blood sugar? 1-2 times/day   Number of hypoglycemic episodes per month 0   Have you had a dilated eye exam in the past 12 months? Yes   Have you had a dental exam in the past 12 months? Yes   Are you checking your feet? Yes   How many days per week are you checking your feet? 7     Dietary Intake   Breakfast toast x2 and coffee     Exercise   Exercise Type ADL's;Light (walking / raking leaves)   How many days per week to you exercise? 6   How many minutes per day do you exercise? 30   Total minutes per week of exercise 180     Patient Education   Psychosocial adjustment Worked with patient to identify barriers to care and solutions     Individualized Goals (developed by  patient)   Problem Solving --  ask for help when needed/if diabetes not controlled the want     Patient Self-Evaluation of Goals - Patient rates self as meeting previously set goals (% of time)   Problem Solving 50 - 75 %     Post-Education Assessment   Patient understands how to develop strategies to address psychosocial issues. Demonstrates understanding / competency     Outcomes   Program Status Completed     Subsequent Visit   Since your last visit have you continued or begun to take your medications as prescribed? Yes   Since your last visit have you had your blood pressure checked? Yes   Is your most recent blood pressure lower, unchanged, or higher since your last visit? Unchanged   Since your last visit have you experienced any weight changes? No change   Since your last visit, are you checking your blood glucose at least once a day? No      Learning Objective:  Patient will have a greater understanding of diabetes self-management. Patient education plan is to attend individual and/or group sessions per assessed needs and concerns.   Plan:   There are no Patient Instructions on file for this visit.   Expected Outcomes:  Demonstrated interest in learning. Expect positive outcomes  Education material provided: Support group flyer  If problems or questions, patient to contact team via:  Phone  Future DSME appointment: - 6 months- request new meter prescription from doctor

## 2016-10-08 ENCOUNTER — Other Ambulatory Visit: Payer: Self-pay | Admitting: Internal Medicine

## 2016-10-08 ENCOUNTER — Telehealth: Payer: Self-pay | Admitting: Internal Medicine

## 2016-10-08 DIAGNOSIS — Z794 Long term (current) use of insulin: Principal | ICD-10-CM

## 2016-10-08 DIAGNOSIS — IMO0002 Reserved for concepts with insufficient information to code with codable children: Secondary | ICD-10-CM

## 2016-10-08 DIAGNOSIS — E1165 Type 2 diabetes mellitus with hyperglycemia: Secondary | ICD-10-CM

## 2016-10-08 NOTE — Telephone Encounter (Signed)
APT. REMINDER CALL, LMTCB °

## 2016-10-08 NOTE — Progress Notes (Signed)
   CC: Diabetes  HPI:  [name] is a 60 y.o. female who presents today for diabetes. Please see assessment & plan for status of chronic medical problems.  Past Medical History:  Diagnosis Date  . Allergy   . Anxiety    occasional  . Arthritis   . Atherosclerotic peripheral vascular disease with intermittent claudication ([place])    s/p fem-fem bypass 2011. ABI 07/2012 0.5 R 0.65 L  . Diabetes mellitus without complication ([place])   . GERD (gastroesophageal reflux disease)   . Hyperlipidemia   . Hypertension   . Personal history of colonic polyps - adenomas 03/07/2014  . Tobacco abuse     Review of Systems:  Please see each problem below for a pertinent review of systems.   Physical Exam:  Vitals:   10/09/16 1402  BP: 138/65  Pulse: 81  Temp: 97.9 F (36.6 C)  TempSrc: Oral  SpO2: 100%  Weight: 148 lb 1.6 oz (67.2 kg)  Height: 5\' 5"  (1.651 m)   Physical Exam  Constitutional: She is oriented to person, place, and time. No distress.  HENT:  Head: Normocephalic and atraumatic.  Eyes: Conjunctivae and EOM are normal.  Cardiovascular: Normal rate and regular rhythm.   Pulmonary/Chest: Effort normal. No respiratory distress.  Neurological: She is alert and oriented to person, place, and time.  Skin: Skin is warm and dry. She is not diaphoretic.     Assessment & Plan:   See Encounters Tab for problem based charting.  Patient discussed with Dr. Angelia Mould

## 2016-10-09 ENCOUNTER — Ambulatory Visit (INDEPENDENT_AMBULATORY_CARE_PROVIDER_SITE_OTHER): Payer: Medicaid Other | Admitting: Internal Medicine

## 2016-10-09 ENCOUNTER — Encounter: Payer: Self-pay | Admitting: Internal Medicine

## 2016-10-09 VITALS — BP 138/65 | HR 81 | Temp 97.9°F | Ht 65.0 in | Wt 148.1 lb

## 2016-10-09 DIAGNOSIS — IMO0002 Reserved for concepts with insufficient information to code with codable children: Secondary | ICD-10-CM

## 2016-10-09 DIAGNOSIS — Z23 Encounter for immunization: Secondary | ICD-10-CM

## 2016-10-09 DIAGNOSIS — E1159 Type 2 diabetes mellitus with other circulatory complications: Secondary | ICD-10-CM | POA: Diagnosis not present

## 2016-10-09 DIAGNOSIS — Z1159 Encounter for screening for other viral diseases: Secondary | ICD-10-CM

## 2016-10-09 DIAGNOSIS — F17210 Nicotine dependence, cigarettes, uncomplicated: Secondary | ICD-10-CM

## 2016-10-09 DIAGNOSIS — Z114 Encounter for screening for human immunodeficiency virus [HIV]: Secondary | ICD-10-CM | POA: Diagnosis not present

## 2016-10-09 DIAGNOSIS — M7542 Impingement syndrome of left shoulder: Secondary | ICD-10-CM | POA: Diagnosis not present

## 2016-10-09 DIAGNOSIS — I152 Hypertension secondary to endocrine disorders: Secondary | ICD-10-CM

## 2016-10-09 DIAGNOSIS — Z794 Long term (current) use of insulin: Secondary | ICD-10-CM | POA: Diagnosis not present

## 2016-10-09 DIAGNOSIS — E1165 Type 2 diabetes mellitus with hyperglycemia: Secondary | ICD-10-CM

## 2016-10-09 DIAGNOSIS — I10 Essential (primary) hypertension: Secondary | ICD-10-CM

## 2016-10-09 DIAGNOSIS — Z79899 Other long term (current) drug therapy: Secondary | ICD-10-CM | POA: Diagnosis not present

## 2016-10-09 LAB — GLUCOSE, CAPILLARY: Glucose-Capillary: 191 mg/dL — ABNORMAL HIGH (ref 65–99)

## 2016-10-09 LAB — POCT GLYCOSYLATED HEMOGLOBIN (HGB A1C): HEMOGLOBIN A1C: 9

## 2016-10-09 MED ORDER — SITAGLIPTIN PHOS-METFORMIN HCL 50-1000 MG PO TABS: 1.0000 | ORAL_TABLET | Freq: Two times a day (BID) | ORAL | 1 refills | Status: DC

## 2016-10-09 MED ORDER — INSULIN GLARGINE 100 UNIT/ML ~~LOC~~ SOLN: 24.0000 [IU] | Freq: Every day | SUBCUTANEOUS | 1 refills | Status: DC

## 2016-10-09 NOTE — Assessment & Plan Note (Addendum)
Assessment She is interested in being screened for HIV today. She denies risk factors, like high-risk sexual activity or illicit drug use.  Plan -Check HIV  ADDENDUM 10/12/2016  3:55 PM:  HIV negative. I will send her the results by mail as I explained to her.

## 2016-10-09 NOTE — Assessment & Plan Note (Signed)
Assessment Since her steroid injection, she is not had any problems with her left shoulder and is not taking any analgesics, like naproxen or hydrocodone/acetaminophen.  Plan -Discontinue naproxen and hydrocodone/acetaminophen -Continue assessing

## 2016-10-09 NOTE — Assessment & Plan Note (Signed)
Assessment A1c 9.0, improved from 9.5 months ago. Review of her blood sugar log today shows mostly values trending 100-150 with occasional outliers as high as the upper 200s to lower 300s. She did have 2 hypoglycemic episodes with readings in the upper 60s, neither of which she was aware until she checked her blood sugar. Her current regimen is sitagliptin 100 mg daily, metformin 1000 mg twice daily, Lantus 22 units at bedtime. She does admit missing 1-2 doses of insulin a week if it is past midnight. She does not report symptoms of hypoglycemia, like blurry vision, polyuria, polydipsia.  Plan -Commended her on her improved glycemic control and recommended she continue to keep up her good work -Prescribed metformin/sitagliptin 1000/50 mg to be taken twice daily and discontinued the separate metformin and sitagliptin prescriptions -Increased bedtime Lantus by 10% from 22 units to 24 units and recommended she drop her dose to 15 units if she has not eaten much throughout the day to avoid hypoglycemic events -Follow-up in one month to review blood sugar log

## 2016-10-09 NOTE — Assessment & Plan Note (Signed)
Assessment Her blood pressure today is 138/65. She is currently on lisinopril/HCTZ 20/25 mg daily. She does not report symptoms of elevated blood pressure, like headache, blurry vision, abdominal pain, chest pain.  Plan -Continue lisinopril/HCTZ 20/25 mg daily

## 2016-10-09 NOTE — Progress Notes (Signed)
Internal Medicine Clinic Attending  Case discussed with Dr. Patel,[name] at the time of the visit.  We reviewed the resident's history and exam and pertinent patient test results.  I agree with the assessment, diagnosis, and plan of care documented in the resident's note.  

## 2016-10-09 NOTE — Patient Instructions (Addendum)
For your diabetes, please keep up the good work. Let's go up on the Lantus to 24 units at bedtime. If you don't eat anything the day before, please cut back to 15 units.   We will send you a copy of your labwork today.  Let's see each other back next month. Please bring your meter like today.

## 2016-10-09 NOTE — Assessment & Plan Note (Signed)
Assessment She is agreeable to receiving the flu vaccine today.  Plan -Administer flu vaccine today

## 2016-10-09 NOTE — Assessment & Plan Note (Addendum)
Assessment Given her age, she is eligible for HCV screening. She denies risk factors such as illicit drug use or receiving blood transfusion products.  Plan -Check HCV  ADDENDUM 10/12/2016  3:55 PM:  HCV negative. I will send her the results by mail as I explained to her.

## 2016-10-10 LAB — HEPATITIS C ANTIBODY

## 2016-10-10 LAB — HIV ANTIBODY (ROUTINE TESTING W REFLEX): HIV SCREEN 4TH GENERATION: NONREACTIVE

## 2016-10-12 ENCOUNTER — Encounter: Payer: Self-pay | Admitting: Internal Medicine

## 2016-10-13 ENCOUNTER — Telehealth: Payer: Self-pay | Admitting: *Deleted

## 2016-10-13 DIAGNOSIS — E1165 Type 2 diabetes mellitus with hyperglycemia: Secondary | ICD-10-CM

## 2016-10-13 DIAGNOSIS — IMO0002 Reserved for concepts with insufficient information to code with codable children: Secondary | ICD-10-CM

## 2016-10-13 DIAGNOSIS — Z794 Long term (current) use of insulin: Principal | ICD-10-CM

## 2016-10-13 NOTE — Telephone Encounter (Signed)
Pharmacy requesting new rx for Janumet 50-1000 with a qty of #60, instead of #30.Despina Hidden Cassady10/31/20174:56 PM

## 2016-10-14 MED ORDER — SITAGLIPTIN PHOS-METFORMIN HCL 50-1000 MG PO TABS: 1.0000 | ORAL_TABLET | Freq: Two times a day (BID) | ORAL | 1 refills | Status: DC

## 2016-10-14 NOTE — Telephone Encounter (Signed)
Thank you for the update. I made the change.

## 2016-12-08 ENCOUNTER — Other Ambulatory Visit: Payer: Self-pay | Admitting: Oncology

## 2016-12-15 ENCOUNTER — Telehealth: Payer: Self-pay | Admitting: Dietician

## 2016-12-15 ENCOUNTER — Other Ambulatory Visit: Payer: Self-pay | Admitting: Dietician

## 2016-12-15 DIAGNOSIS — Z794 Long term (current) use of insulin: Principal | ICD-10-CM

## 2016-12-15 DIAGNOSIS — E1165 Type 2 diabetes mellitus with hyperglycemia: Secondary | ICD-10-CM

## 2016-12-15 DIAGNOSIS — IMO0002 Reserved for concepts with insufficient information to code with codable children: Secondary | ICD-10-CM

## 2016-12-15 MED ORDER — INSULIN GLARGINE 100 UNIT/ML ~~LOC~~ SOLN: 24.0000 [IU] | Freq: Every day | SUBCUTANEOUS | 1 refills | Status: DC

## 2016-12-15 NOTE — Telephone Encounter (Signed)
Out of insulin. Requests follow up on previous request for refill

## 2016-12-15 NOTE — Telephone Encounter (Signed)
Opened in error

## 2016-12-15 NOTE — Telephone Encounter (Signed)
As this patient was seen by me and deemed necessary for their treatment, I will refill this prescription for glargine.

## 2016-12-15 NOTE — Telephone Encounter (Signed)
I could not get in touch with her to tell her to come in for a sample.

## 2016-12-16 NOTE — Telephone Encounter (Addendum)
Patient's husband called to find out why her insulin prescriptopn had not been sent in. Left message for patient's husband that it was sent in last night.   Also, spoke with patient who says her sugar is in the 300s. She will pick up the lantus today. She says she has one refill of the janumet and that with this and her lantus her sugars were very well controlled. She asked when her A1C was due and wanted to schedule with her doctor around that time. . Her call was transferred to the front office to schedule an appintment with her doctor.

## 2016-12-17 MED ORDER — SITAGLIPTIN PHOS-METFORMIN HCL 50-1000 MG PO TABS: 1.0000 | ORAL_TABLET | Freq: Two times a day (BID) | ORAL | 2 refills | Status: DC

## 2016-12-17 NOTE — Addendum Note (Signed)
Addended by: Riccardo Dubin on: 12/17/2016 02:30 PM   Modules accepted: Orders

## 2016-12-17 NOTE — Telephone Encounter (Signed)
She was to follow-up with me in November to reassess her blood sugars but unfortunately did not do so. I am okay with extending her refill until she sees me in March.

## 2017-01-28 ENCOUNTER — Encounter: Payer: Self-pay | Admitting: Internal Medicine

## 2017-02-11 ENCOUNTER — Telehealth: Payer: Self-pay | Admitting: Internal Medicine

## 2017-02-11 NOTE — Telephone Encounter (Signed)
APT. REMINDER CALL, LMTCB °

## 2017-02-12 ENCOUNTER — Encounter: Payer: Medicaid Other | Admitting: Internal Medicine

## 2017-02-12 ENCOUNTER — Encounter: Payer: Self-pay | Admitting: Internal Medicine

## 2017-03-10 ENCOUNTER — Other Ambulatory Visit: Payer: Self-pay | Admitting: *Deleted

## 2017-03-10 DIAGNOSIS — E1165 Type 2 diabetes mellitus with hyperglycemia: Secondary | ICD-10-CM

## 2017-03-10 DIAGNOSIS — Z794 Long term (current) use of insulin: Principal | ICD-10-CM

## 2017-03-10 DIAGNOSIS — IMO0002 Reserved for concepts with insufficient information to code with codable children: Secondary | ICD-10-CM

## 2017-03-10 MED ORDER — INSULIN GLARGINE 100 UNIT/ML ~~LOC~~ SOLN: 24.0000 [IU] | Freq: Every day | SUBCUTANEOUS | 1 refills | Status: DC

## 2017-03-10 NOTE — Telephone Encounter (Signed)
I will send in this refill for now and hope she follows up next week for reassessment.

## 2017-03-17 ENCOUNTER — Telehealth: Payer: Self-pay | Admitting: Internal Medicine

## 2017-03-17 NOTE — Telephone Encounter (Signed)
Calling to confirm appt for 03/18/17 at 9:145 lmtcb

## 2017-03-18 ENCOUNTER — Ambulatory Visit (INDEPENDENT_AMBULATORY_CARE_PROVIDER_SITE_OTHER): Payer: Medicaid Other | Admitting: Internal Medicine

## 2017-03-18 ENCOUNTER — Encounter (INDEPENDENT_AMBULATORY_CARE_PROVIDER_SITE_OTHER): Payer: Self-pay

## 2017-03-18 DIAGNOSIS — Z79899 Other long term (current) drug therapy: Secondary | ICD-10-CM

## 2017-03-18 DIAGNOSIS — Z794 Long term (current) use of insulin: Secondary | ICD-10-CM

## 2017-03-18 DIAGNOSIS — E1165 Type 2 diabetes mellitus with hyperglycemia: Secondary | ICD-10-CM

## 2017-03-18 DIAGNOSIS — IMO0002 Reserved for concepts with insufficient information to code with codable children: Secondary | ICD-10-CM

## 2017-03-18 DIAGNOSIS — K219 Gastro-esophageal reflux disease without esophagitis: Secondary | ICD-10-CM

## 2017-03-18 DIAGNOSIS — Z0000 Encounter for general adult medical examination without abnormal findings: Secondary | ICD-10-CM

## 2017-03-18 LAB — GLUCOSE, CAPILLARY: Glucose-Capillary: 164 mg/dL — ABNORMAL HIGH (ref 65–99)

## 2017-03-18 LAB — POCT GLYCOSYLATED HEMOGLOBIN (HGB A1C): Hemoglobin A1C: 8.3

## 2017-03-18 MED ORDER — SITAGLIPTIN PHOS-METFORMIN HCL 50-1000 MG PO TABS: 1.0000 | ORAL_TABLET | Freq: Two times a day (BID) | ORAL | 2 refills | Status: DC

## 2017-03-18 MED ORDER — GLUCOSE BLOOD VI STRP: ORAL_STRIP | 11 refills | Status: DC

## 2017-03-18 MED ORDER — PRAVASTATIN SODIUM 40 MG PO TABS: 40.0000 mg | ORAL_TABLET | Freq: Every evening | ORAL | 3 refills | Status: DC

## 2017-03-18 MED ORDER — OMEPRAZOLE 40 MG PO CPDR: DELAYED_RELEASE_CAPSULE | ORAL | 2 refills | Status: DC

## 2017-03-18 NOTE — Assessment & Plan Note (Signed)
Patient will call her GI doctors office to schedule an appointment for a colonoscopy.

## 2017-03-18 NOTE — Assessment & Plan Note (Signed)
Her A1c has improved. Recommended that she increase her Lantus to 25 or 26 units on days where she is eating a lot more at night. She is usually taking Lantus 24 units daily at bedtime. Refilled her Janumet, pravastatin, and test strips today. She will continue to work on dietary modifications. Follow-up in 3 months.

## 2017-03-18 NOTE — Progress Notes (Signed)
   CC: Diabetes follow-up  HPI:  [name] is a 61 y.o. woman with past medical history as noted below who presents today for diabetes follow-up.  Type 2 diabetes: Her last A1c was 9.0. Today her A1c is 8.3. She is taking Janumet 50-1000 milligrams twice daily and Lantus 24-26 units daily at bedtime. She notes she is a "nighttime eater" and this is the cause for her occasional elevated blood glucose levels in the morning. Per review of her glucometer, blood sugars range from the 150-250 range with several in good range of 80-120. She had one hypoglycemic reading of 66. She has been working on cutting out foods that she knows raise her blood sugar. She states her depression contributes to her bad eating habits as well, but is working on this too.  Indigestion: She reports worsening indigestion over the last 6 months. This has been a chronic issue for her. She admits she just restarted her omeprazole 20 mg daily about 2-3 months ago. She describes feeling that she cannot belch and has associated nausea. She is taking omeprazole 30 minutes prior to her meal. She denies any heartburn, dysphagia, or odynophasia.    Past Medical History:  Diagnosis Date  . Allergy   . Anxiety    occasional  . Arthritis   . Atherosclerotic peripheral vascular disease with intermittent claudication ([place])    s/p fem-fem bypass 2011. ABI 07/2012 0.5 R 0.65 L  . Diabetes mellitus without complication ([place])   . GERD (gastroesophageal reflux disease)   . Hyperlipidemia   . Hypertension   . Personal history of colonic polyps - adenomas 03/07/2014  . Tobacco abuse     Review of Systems:   General: Denies fever, chills, night sweats, changes in weight, changes in appetite HEENT: Denies headaches, ear pain, changes in vision, rhinorrhea, sore throat CV: Denies CP, palpitations, SOB, orthopnea Pulm: Denies SOB, cough, wheezing GI: Denies abdominal pain, vomiting, diarrhea, constipation, melena,  hematochezia GU: Denies dysuria, hematuria, frequency Msk: Denies muscle cramps, joint pains Neuro: Denies weakness, numbness, tingling Skin: Denies rashes, bruising Psych: Denies anxiety, hallucinations  Physical Exam:  Vitals:   03/18/17 0953  BP: 139/74  Pulse: 66  Temp: 97.8 F (36.6 C)  TempSrc: Oral  SpO2: 100%  Weight: 152 lb 14.4 oz (69.4 kg)  Height: 5\' 5"  (1.651 m)   General: Well-nourished woman, NAD HEENT: [place]/AT, EOMI, sclera anicteric, pharynx non-erythematous, mucus membranes moist CV: RRR, no m/g/r Pulm: CTA bilaterally, breaths non-labored Abd: BS+, soft, non-tender, non-distended Neuro: alert and oriented x 3  Assessment & Plan:   See Encounters Tab for problem based charting.  Patient discussed with Dr. Eppie Gibson

## 2017-03-18 NOTE — Assessment & Plan Note (Signed)
Will increase her omeprazole to 40 mg daily. We discussed the importance of taking this medication at least 30 minutes before meals and to stay upright at least 2 hours after meals.

## 2017-03-18 NOTE — Patient Instructions (Addendum)
General Instructions: - Refills made for your medications - Increase Omeprazole to 40 mg daily. New prescription sent to pharmacy. - Call your GI doctor to schedule colonoscopy - Try tylenol or ibuprofen for your arm  Please bring your medicines with you each time you come to clinic.  Medicines may include prescription medications, over-the-counter medications, herbal remedies, eye drops, vitamins, or other pills.   Progress Toward Treatment Goals:  Treatment Goal 08/09/2015  Hemoglobin A1C unable to assess  Blood pressure at goal  Stop smoking smoking the same amount    Self Care Goals & Plans:  Self Care Goal 10/09/2016  Manage my medications take my medicines as prescribed; bring my medications to every visit; refill my medications on time  Monitor my health keep track of my blood glucose; bring my glucose meter and log to each visit  Eat healthy foods drink diet soda or water instead of juice or soda; eat more vegetables; eat foods that are low in salt; eat baked foods instead of fried foods; eat fruit for snacks and desserts  Be physically active find an activity I enjoy  Stop smoking cut down the number of cigarettes smoked  Meeting treatment goals -    Home Blood Glucose Monitoring 08/09/2015  Check my blood sugar 2 times a day  When to check my blood sugar before breakfast     Care Management & Community Referrals:  Referral 06/26/2016  Referrals made for care management support [facility] case management program  Referrals made to community resources -

## 2017-03-22 NOTE — Progress Notes (Signed)
Case discussed with Dr. Rivet soon after the resident saw the patient. We reviewed the resident's history and exam and pertinent patient test results. I agree with the assessment, diagnosis, and plan of care documented in the resident's note. 

## 2017-05-07 ENCOUNTER — Encounter: Payer: Self-pay | Admitting: Internal Medicine

## 2017-05-07 ENCOUNTER — Ambulatory Visit (INDEPENDENT_AMBULATORY_CARE_PROVIDER_SITE_OTHER): Payer: Medicaid Other | Admitting: Internal Medicine

## 2017-05-07 VITALS — BP 120/71 | HR 75 | Temp 98.6°F | Ht 65.0 in | Wt 152.6 lb

## 2017-05-07 DIAGNOSIS — E785 Hyperlipidemia, unspecified: Secondary | ICD-10-CM

## 2017-05-07 DIAGNOSIS — E784 Other hyperlipidemia: Secondary | ICD-10-CM | POA: Diagnosis not present

## 2017-05-07 DIAGNOSIS — E1169 Type 2 diabetes mellitus with other specified complication: Secondary | ICD-10-CM | POA: Diagnosis not present

## 2017-05-07 DIAGNOSIS — IMO0002 Reserved for concepts with insufficient information to code with codable children: Secondary | ICD-10-CM

## 2017-05-07 DIAGNOSIS — Z86010 Personal history of colonic polyps: Secondary | ICD-10-CM | POA: Diagnosis not present

## 2017-05-07 DIAGNOSIS — Z794 Long term (current) use of insulin: Secondary | ICD-10-CM | POA: Diagnosis not present

## 2017-05-07 DIAGNOSIS — M7542 Impingement syndrome of left shoulder: Secondary | ICD-10-CM | POA: Diagnosis present

## 2017-05-07 DIAGNOSIS — D509 Iron deficiency anemia, unspecified: Secondary | ICD-10-CM

## 2017-05-07 DIAGNOSIS — E1165 Type 2 diabetes mellitus with hyperglycemia: Secondary | ICD-10-CM

## 2017-05-07 LAB — GLUCOSE, CAPILLARY: Glucose-Capillary: 141 mg/dL — ABNORMAL HIGH (ref 65–99)

## 2017-05-07 MED ORDER — ATORVASTATIN CALCIUM 40 MG PO TABS: 40.0000 mg | ORAL_TABLET | Freq: Every day | ORAL | 0 refills | Status: DC

## 2017-05-07 MED ORDER — MELOXICAM 7.5 MG PO TABS: 7.5000 mg | ORAL_TABLET | Freq: Every day | ORAL | 0 refills | Status: DC

## 2017-05-07 NOTE — Progress Notes (Signed)
   CC: left shoulder pain  HPI:  Angie Cain is a 61 y.o. who presents today for left shoulder pain. Please see assessment & plan for status of chronic medical problems.    Past Medical History:  Diagnosis Date  . Allergy   . Anxiety    occasional  . Arthritis   . Atherosclerotic peripheral vascular disease with intermittent claudication (Angie Cain)    s/p fem-fem bypass 2011. ABI 07/2012 0.5 R 0.65 L  . Diabetes mellitus without complication ([place])   . GERD (gastroesophageal reflux disease)   . Hyperlipidemia   . Hypertension   . Personal history of colonic polyps - adenomas 03/07/2014  . Tobacco abuse     Review of Systems:  Please see each problem below for a pertinent review of systems.   Physical Exam:  Vitals:   05/07/17 1533  BP: 120/71  Pulse: 75  Temp: 98.6 F (37 C)  TempSrc: Oral  SpO2: 100%  Weight: 152 lb 9.6 oz (69.2 kg)  Height: 5\' 5"  (1.651 m)   Physical Exam  Constitutional: No distress.  HENT:  Head: Normocephalic and atraumatic.  Eyes: Conjunctivae are normal. No scleral icterus.  Pulmonary/Chest: Effort normal. No respiratory distress.  Musculoskeletal:  Pain elicited with left shoulder abduction and internal rotation but not with external rotation.  Skin: She is not diaphoretic.    Assessment & Plan:   See Encounters Tab for problem based charting.  Patient seen with Dr. Evette Doffing

## 2017-05-07 NOTE — Patient Instructions (Signed)
For your cholesterol, STOP pravastatin and START atorvastatin.   Keep up the good work with the diabetes.  For your shoulder, we will order physical therapy. Avoid any movements that cause you to reach overhead. Don't worry about the meloxicam.  We will let you know if anything on your blood work comes back abnormal.

## 2017-05-07 NOTE — Progress Notes (Addendum)
Internal Medicine Clinic Attending  I saw and evaluated the patient. I was present for the entirety of the procedure. Point-of-care ultrasound showed long-head biceps tendinitis in addition to thickened and inflammaed subscapularis and supraspinatous tendons. No rotator cuff tears seen. There was a small amount of fluid in the subacromial bursa which we injected with Triamcinolone and Lidocaine from the lateral approach.   Sagittal view of the left shoulder showing thickened supraspinatous tendon and small amount of fluid in the subacromial bursa.      Sagittal view of the humeral head showing the long-head biceps tendon, which is has an associated fluid collection consistent with tendinitis and partial tendon tear. I elected not to inject this, as I think it is high risk for further tearing.

## 2017-05-08 ENCOUNTER — Encounter: Payer: Self-pay | Admitting: Internal Medicine

## 2017-05-08 DIAGNOSIS — D509 Iron deficiency anemia, unspecified: Secondary | ICD-10-CM

## 2017-05-08 HISTORY — DX: Iron deficiency anemia, unspecified: D50.9

## 2017-05-08 LAB — CBC
HEMATOCRIT: 33.6 % — AB (ref 34.0–46.6)
HEMOGLOBIN: 10.1 g/dL — AB (ref 11.1–15.9)
MCH: 21.4 pg — AB (ref 26.6–33.0)
MCHC: 30.1 g/dL — AB (ref 31.5–35.7)
MCV: 71 fL — ABNORMAL LOW (ref 79–97)
Platelets: 475 10*3/uL — ABNORMAL HIGH (ref 150–379)
RBC: 4.72 x10E6/uL (ref 3.77–5.28)
RDW: 17 % — AB (ref 12.3–15.4)
WBC: 6.6 10*3/uL (ref 3.4–10.8)

## 2017-05-08 LAB — CMP14 + ANION GAP
A/G RATIO: 1.4 (ref 1.2–2.2)
ALK PHOS: 62 IU/L (ref 39–117)
ALT: 5 IU/L (ref 0–32)
AST: 10 IU/L (ref 0–40)
Albumin: 4 g/dL (ref 3.6–4.8)
Anion Gap: 14 mmol/L (ref 10.0–18.0)
BUN / CREAT RATIO: 9 — AB (ref 12–28)
BUN: 7 mg/dL — AB (ref 8–27)
Bilirubin Total: <0.2 mg/dL (ref 0.0–1.2)
CHLORIDE: 101 mmol/L (ref 96–106)
CO2: 24 mmol/L (ref 18–29)
Calcium: 9.3 mg/dL (ref 8.7–10.3)
Creatinine, Ser: 0.74 mg/dL (ref 0.57–1.00)
GFR calc non Af Amer: 87 mL/min/{1.73_m2} (ref >59)
GFR, EST AFRICAN AMERICAN: 100 mL/min/{1.73_m2} (ref >59)
GLUCOSE: 96 mg/dL (ref 65–99)
Globulin, Total: 2.9 g/dL (ref 1.5–4.5)
POTASSIUM: 3.7 mmol/L (ref 3.5–5.2)
Sodium: 139 mmol/L (ref 134–144)
TOTAL PROTEIN: 6.9 g/dL (ref 6.0–8.5)

## 2017-05-08 LAB — FERRITIN: FERRITIN: 9 ng/mL — AB (ref 15–150)

## 2017-05-08 MED ORDER — FERROUS SULFATE 325 (65 FE) MG PO TBEC: 325.0000 mg | DELAYED_RELEASE_TABLET | Freq: Every day | ORAL | 2 refills | Status: DC

## 2017-05-08 NOTE — Assessment & Plan Note (Addendum)
Assessment Review of he CBG log today shows most values trending mostly 140s-160s [average 135] with occasional outliers 74, 210 but no hypoglycemic episodes. She confirms taking sitagliptin/metformin 50/1000 mg twice daily with meals and bedtime glargine 24 units.   Plan -Continue sitagliptin/metformin 50/1000 mg twice daily and bedtime glargine 24 units -Follow-up 72-month A1c in July

## 2017-05-08 NOTE — Assessment & Plan Note (Addendum)
Assessment Three years ago, she underwent colonoscopy and had three tubular adenomas measuring <5 mm excised. She is aware she is to follow-up with GI. She denies any family history of colon cancer, changes in stool, changes in her bowel frequency though feels her appetite has been poor over the last month.   Plan -Encouraged her to follow-up with GI -Check CBC and ferritin  ADDENDUM 05/08/2017  8:16 PM:  CBC with Hb 10.1, down from 12.5 at 10 months ago, with MCV 71. Fe severely low at 9. I called her, and she is agreeable to starting iron supplementation as well as following-up with GI for repeat colonoscopy.

## 2017-05-08 NOTE — Assessment & Plan Note (Addendum)
Assessment For the last month, her left upper arm has been bothering her, particularly when she reaches overhead. She has tried tramadol and cyclobenzaprine without relief. Her pain is reminiscent of what she had several months ago which was relieved with a steroid injection.   Physical exam findings and US imaging support impingement syndrome of the left shoulder. She is agreeable to another injection today.  Plan -Perform shoulder injection as outlined below.  After discussion of the risks, benefits and alternative therapies available, the patient elected to proceed. After obtaining informed consent, the patient's identity, procedure, and site were verified during a pause prior to proceding with the minor surgical procedure as per universal protocol recommendations.    Arm was placed in a comfortable dependant position dangling at side with patient sitting comfortably. Entry site for injection of the subdeltoid bursa was identified by palpation and marked with tip of closed pen. Betadine scrub was used for prep. Using a 27-gauge needle, the mixture of lidocaine and Kenalog was easily inserted into the joint. The patient had mild to moderate relief of symptoms immediately post procedure indicating proper placement. There were no complications. Patient tolerated the procedure well.  ADDENDUM 05/11/2017  4:11 PM:  I spoke with her over the phone. Prior to the injection, her shoulder was 10/10 but is now 4/10. She also picked up the meloxicam and is avoiding any reaching movements. She is scheduled for physical therapy in the next week.

## 2017-05-08 NOTE — Assessment & Plan Note (Signed)
Assessment She is currently on pravastatin 40 mg for moderate-intensity statin therapy, but per ASCVD Risk Estimator Plus calculator, 10-year risk is 21.8% and 3.4% with optimal risk factors. High-intensity statin therapy is thus indicated, and she is agreeable to switching to atorvastatin.  Plan -Switched pravastatin 40 mg to atorvastatin 40 mg for high-intensity statin therapy to reduce pill burden of taking pravastatin 80 mg and is also on the [facility] Medicaid formulary

## 2017-05-08 NOTE — Assessment & Plan Note (Signed)
Assessment Three years ago, she underwent colonoscopy and had three tubular adenomas measuring <5 mm excised. She is aware she is to follow-up with GI. She denies any family history of colon cancer, changes in stool, changes in her bowel frequency though feels her appetite has been poor over the last month.   Plan -Encouraged her to follow-up with GI -Check CBC and ferritin  ADDENDUM 05/08/2017  8:16 PM:  CBC with Hb 10.1, down from 12.5 at 10 months ago, with MCV 71. Fe severely low at 9. I called her, and she is agreeable to starting iron supplementation as well as following-up with GI for repeat colonoscopy.

## 2017-05-11 NOTE — Progress Notes (Signed)
Internal Medicine Clinic Attending  I saw and evaluated the patient.  I personally confirmed the key portions of the history and exam documented by Dr. Patel,[name] and I reviewed pertinent patient test results.  The assessment, diagnosis, and plan were formulated together and I agree with the documentation in the resident's note. 

## 2017-05-13 ENCOUNTER — Encounter: Payer: Self-pay | Admitting: Gastroenterology

## 2017-05-13 ENCOUNTER — Ambulatory Visit (INDEPENDENT_AMBULATORY_CARE_PROVIDER_SITE_OTHER): Payer: Medicaid Other | Admitting: Gastroenterology

## 2017-05-13 VITALS — BP 118/74 | HR 84 | Ht 65.0 in | Wt 151.6 lb

## 2017-05-13 DIAGNOSIS — K219 Gastro-esophageal reflux disease without esophagitis: Secondary | ICD-10-CM | POA: Diagnosis not present

## 2017-05-13 DIAGNOSIS — E119 Type 2 diabetes mellitus without complications: Secondary | ICD-10-CM | POA: Diagnosis not present

## 2017-05-13 DIAGNOSIS — IMO0001 Reserved for inherently not codable concepts without codable children: Secondary | ICD-10-CM

## 2017-05-13 DIAGNOSIS — D509 Iron deficiency anemia, unspecified: Secondary | ICD-10-CM

## 2017-05-13 DIAGNOSIS — Z86010 Personal history of colonic polyps: Secondary | ICD-10-CM

## 2017-05-13 DIAGNOSIS — Z794 Long term (current) use of insulin: Secondary | ICD-10-CM

## 2017-05-13 MED ORDER — NA SULFATE-K SULFATE-MG SULF 17.5-3.13-1.6 GM/177ML PO SOLN: 1.0000 | ORAL | 0 refills | Status: DC

## 2017-05-13 NOTE — Progress Notes (Addendum)
05/13/2017 Angie Cain [id number] [birth year]   HISTORY OF PRESENT ILLNESS:  This is a 61 year old female who is known to Dr. Carlean Cain for colonoscopy in March 2015 at which time she is found have 5 diminutive polyps, 3 of which were diminutive adenomas, severe diverticulosis in the left colon and some diverticulosis in the right colon. Repeat colonoscopy was recommended in 3 years from that time. She is here today to discuss that procedure, but also most recently found to be anemic with low iron levels.  Most recent hemoglobin last week was 10.1 g as compared to 12.5 g just 10 months ago. MCV is low at 71. Ferritin is low at 9. She has been started on ferrous sulfate 325 mg daily. She denies seeing any blood in her stools. She says that since she's been taking iron her stools have been dark, but she never noticed any dark or black stools in the past. She has not had any stool Hemoccults performed. She denies any NSAID use. She does admit to eating a lot of ice recently. She thinks that she may have had an upper endoscopy performed many many years ago. She admits to some indigestion and belching. Says that she has omeprazole 40 mg daily at home but has not been taking that regular basis.   Past Medical History:  Diagnosis Date  . Allergy   . Anxiety    occasional  . Arthritis   . Atherosclerotic peripheral vascular disease with intermittent claudication ([place])    s/p fem-fem bypass 2011. ABI 07/2012 0.5 R 0.65 L  . Diabetes mellitus without complication ([place])   . GERD (gastroesophageal reflux disease)   . Hyperlipidemia   . Hypertension   . Personal history of colonic polyps - adenomas 03/07/2014  . Tobacco abuse    Past Surgical History:  Procedure Laterality Date  . VAGINAL HYSTERECTOMY    . 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  She has been smoking about 0.01 packs per day. She has never used smokeless  tobacco. She reports that she drinks alcohol. She reports that she does not use drugs. family history includes Diabetes in her brother, father, mother, and sister; Hypertension in her brother and sister. Allergies  Allergen Reactions  . Metronidazole Itching  . Shellfish Allergy Itching    Mouth irritations      Outpatient Encounter Prescriptions as of 05/13/2017  Medication Sig  . ACCU-CHEK FASTCLIX LANCETS MISC Check blood sugar up to 3 times a day  . atorvastatin (LIPITOR) 40 MG tablet Take 1 tablet (40 mg total) by mouth daily.  . Blood Glucose Monitoring Suppl (ACCU-CHEK AVIVA PLUS) w/Device KIT Check blood sugar up to 3 times a day  . ferrous sulfate 325 (65 FE) MG EC tablet Take 1 tablet (325 mg total) by mouth daily with breakfast.  . glucose blood (ACCU-CHEK AVIVA PLUS) test strip USE 3 TO 4 TIMES DAILY TO CHECK BLOOD SUGAR The patient is insulin requiring, ICD 10 code E11.9. The patient tests 4 times per day.  . insulin glargine (LANTUS) 100 UNIT/ML injection Inject 0.24 mLs (24 Units total) into the skin at bedtime.  Marland Kitchen lisinopril-hydrochlorothiazide (PRINZIDE,ZESTORETIC) 20-25 MG tablet TAKE ONE TABLET BY MOUTH ONCE DAILY  . omeprazole (PRILOSEC) 40 MG capsule TAKE ONE CAPSULE BY MOUTH ONCE DAILY AS NEEDED  . sitaGLIPtin-metformin (JANUMET) 50-1000 MG tablet Take 1 tablet by mouth 2 (two) times daily  with a meal.   No facility-administered encounter medications on file as of 05/13/2017.      REVIEW OF SYSTEMS  : All other systems reviewed and negative except where noted in the History of Present Illness.   PHYSICAL EXAM: BP 118/74   Pulse 84   Ht _0  (1.651 m)   Wt 151 lb 9.6 oz (68.8 kg)   SpO2 99%   BMI 25.23 kg/m  General: Well developed black female in no acute distress Head: Normocephalic and atraumatic Eyes:  Sclerae anicteric, conjunctiva pink. Ears: Normal auditory acuity Lungs: Clear throughout to auscultation; no increased WOB. Heart: Regular rate and  rhythm Abdomen: Soft, non-distended. Normal bowel sounds.  Non-tender. Rectal:  Will be done at the time of colonoscopy. Musculoskeletal: Symmetrical with no gross deformities  Skin: No lesions on visible extremities Extremities: No edema  Neurological: Alert oriented x 4, grossly non-focal Psychological:  Alert and cooperative. Normal mood and affect  ASSESSMENT AND PLAN: -Personal history of colon polyps:  2 diminutive adenomas in 02/2014.  Due for surveillance at this time.  Will schedule with dr. Carlean Cain. -IDA:  Hgb down 2.5 grams in ten months and ferritin low.  No other iron studies or hemoccult performed.  Recently started on ferrous sulfate 325 mg daily. If she can tolerate this without significant nausea or constipation then she may need to increase her dose to 2 or 3 times a day. May also need to consider IV iron. We'll schedule for EGD with Dr. Carlean Cain as well. -GERD:  We'll evaluate with EGD. She will begin her omeprazole 40 mg daily as well. -IDDM:  Insulin will be adjusted prior to endoscopic procedure per protocol. Will resume normal dosing after procedure.  **The risks, benefits, and alternatives to EGD and colonoscopy were discussed with the patient and she consents to proceed.    CC:  Angie Dubin, MD  Agree with [name]'s management.  Angie Mayer, MD, Angie Cain

## 2017-05-13 NOTE — Patient Instructions (Signed)

## 2017-05-17 ENCOUNTER — Ambulatory Visit
Payer: Medicaid Other | Attending: Student in an Organized Health Care Education/Training Program | Admitting: Physical Therapy

## 2017-05-17 ENCOUNTER — Encounter: Payer: Self-pay | Admitting: *Deleted

## 2017-05-17 DIAGNOSIS — M6281 Muscle weakness (generalized): Secondary | ICD-10-CM

## 2017-05-17 DIAGNOSIS — R293 Abnormal posture: Secondary | ICD-10-CM

## 2017-05-17 DIAGNOSIS — G8929 Other chronic pain: Secondary | ICD-10-CM

## 2017-05-17 DIAGNOSIS — M25512 Pain in left shoulder: Secondary | ICD-10-CM | POA: Diagnosis not present

## 2017-05-17 NOTE — Therapy (Signed)
[place], [place], [address] Phone: [phone]   Fax:  [phone]  Physical Therapy Treatment and Discharge  Patient Details  Name: Angie Cain MRN: [id number] Date of Birth: March 26, 1956 Referring Provider: Dr. Posey Pronto  Encounter Date: 05/17/2017      PT End of Session - 05/17/17 1245    Visit Number 1   Number of Visits 1   PT Start Time 3016   PT Stop Time 1223   PT Time Calculation (min) 38 min   Activity Tolerance Patient tolerated treatment well      Past Medical History:  Diagnosis Date  . Allergy   . Anxiety    occasional  . Arthritis   . Atherosclerotic peripheral vascular disease with intermittent claudication ([place])    s/p fem-fem bypass 2011. ABI 07/2012 0.5 R 0.65 L  . Diabetes mellitus without complication ([place])   . GERD (gastroesophageal reflux disease)   . Hyperlipidemia   . Hypertension   . Personal history of colonic polyps - adenomas 03/07/2014  . Tobacco abuse     Past Surgical History:  Procedure Laterality Date  . VAGINAL HYSTERECTOMY    . 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      Subjective Assessment - 05/17/17 1148    Subjective Patient reports onset of L shoulder pain over the past 2 yrs.  She has limitations in performing certain movements, reaching up and across for ADLs, dressing.  She has had 2 steriod injections which has helped.     Limitations Lifting;House hold activities   Diagnostic tests diagnostic ultrasound    Patient Stated Goals regain normal use of her arm    Currently in Pain? Yes   Pain Score 3    Pain Location Arm   Pain Orientation Left   Pain Descriptors / Indicators Aching;Sharp;Stabbing  at times can be stabbing    Pain Type Chronic pain   Pain Onset More than a month ago   Aggravating Factors  can be spontaneous, laying on L side, using it    Pain Relieving  Factors warm, lay on R.t side , rest            [facility] PT Assessment - 05/17/17 1152      Assessment   Medical Diagnosis L shoulder impingement   Referring Provider Dr. Posey Pronto   Onset Date/Surgical Date --  2 yrs    Hand Dominance Right   Next MD Visit unknown   Prior Therapy No     Precautions   Precautions None     Restrictions   Weight Bearing Restrictions No     Balance Screen   Has the patient fallen in the past 6 months Yes   How many times? 1  fell on Rt. arm , was sick, hot, stomach bug    Has the patient had a decrease in activity level because of a fear of falling?  Yes   Is the patient reluctant to leave their home because of a fear of falling?  No     Home Ecologist residence   Living Arrangements Spouse/significant other     Prior Function   Level of Independence Independent     Cognition   Overall Cognitive Status Within Functional Limits for tasks assessed     Observation/Other Assessments   Focus on Therapeutic Outcomes (FOTO)  NT     Sensation   Light Touch Appears Intact     AROM   Left Shoulder Extension 43 Degrees  pain    Left Shoulder Flexion 135 Degrees  pain   Left Shoulder ABduction 150 Degrees  pain   Left Shoulder Internal Rotation 40 Degrees  Fr to T6   Left Shoulder External Rotation 80 Degrees  FR to T2     Strength   Left Shoulder Flexion 3+/5   Left Shoulder ABduction 3/5   Left Shoulder Internal Rotation 4+/5   Left Shoulder External Rotation 4/5   Left Elbow Flexion 5/5   Left Elbow Extension 4/5     Palpation   Palpation comment TTP anterior lateal aspect of L shoulder, none in biceps                      [facility] Adult PT Treatment/Exercise - 05/17/17 1243      Self-Care   Self-Care Lifting;Posture;Heat/Ice Application   Lifting avoid overhead lifting, caution with reaching    Posture anatomy of shoulder, impingement    Heat/Ice Application ice post ex , HEP      Shoulder Exercises: Seated   Retraction Strengthening;Left;12 reps   Row Strengthening;Both;15 reps   Theraband Level (Shoulder Row) Level 3 (Green)   Horizontal ABduction Strengthening;Both;10 reps   Theraband Level (Shoulder Horizontal ABduction) Level 2 (Red)   External Rotation Strengthening;Both;10 reps   Theraband Level (Shoulder External Rotation) Level 2 (Red)     Cryotherapy   Number Minutes Cryotherapy 5 Minutes   Cryotherapy Location Shoulder   Type of Cryotherapy Ice pack                PT Education - 05/17/17 1245    Education provided Yes   Education Details PT/POC, medicaid coverage, HEP, sefl care    Person(s) Educated Patient   Methods Explanation;Demonstration;Tactile cues;Handout;Verbal cues   Comprehension Returned demonstration;Verbalized understanding          PT Short Term Goals - 05/17/17 1250      PT SHORT TERM GOAL #1   Title Pt will be given HEP and info related to posture, lifting, diagnosis.    Time 1   Period Days   Status Achieved                  Plan - 05/17/17 1246    Clinical Impression Statement Patient with confirmed L shoulder impingement, pain and weakness in L UE limiting reaching, especially across her body and up overhead.  She unfortunately does not have MCD coverage for this diagnosis.  She was given info on posture and and HEP to promote scapular/ rotator cuff stability anf strength.     Clinical Presentation Stable   Clinical Decision Making Low   Rehab Potential Excellent   PT Frequency One time visit   PT Treatment/Interventions Cryotherapy;Therapeutic exercise;ADLs/Self Care Home Management   PT Next Visit Plan NA   PT Home Exercise Plan scap retraction, ER, horiz abd and row, red and green bands   Consulted and Agree with Plan of Care Patient      Patient will benefit from skilled therapeutic intervention in order to improve the following deficits and impairments:  Pain, Impaired UE functional use,  Decreased strength, Postural dysfunction, Decreased range of motion  Visit Diagnosis: Chronic left shoulder pain  Muscle weakness (generalized)  Abnormal posture     Problem List Patient Active Problem List   Diagnosis Date Noted  .  Iron deficiency anemia 05/08/2017  . Healthcare maintenance 03/18/2017  . Eye discharge 07/31/2016  . Rotator cuff impingement syndrome of left shoulder 03/23/2016  . Osteopenia determined by x-ray 01/25/2015  . Bilateral plantar fasciitis 09/30/2014  . History of colonic polyps 03/07/2014  . Low back pain 12/18/2013  . GERD (gastroesophageal reflux disease) 06/06/2013  . Hyperlipidemia associated with type 2 diabetes mellitus ([place]) 06/06/2013  . Tobacco use disorder 07/21/2007  . Hypertension associated with diabetes ([place]) 07/21/2007  . IDDM (insulin dependent diabetes mellitus) ([place]) 07/21/2007  . Insulin dependent type 2 diabetes mellitus, uncontrolled ([place]) 07/20/1993    Magic Mohler 05/17/2017, 12:52 PM  [facility] [address] [place], [place], [address] Phone: [phone]   Fax:  [phone]  Name: Angie Cain MRN: [id number] Date of Birth: 1956-08-14  Raeford Razor, PT 05/17/17 12:53 PM Phone: [phone] Fax: [phone]

## 2017-06-09 ENCOUNTER — Telehealth: Payer: Self-pay | Admitting: Dietician

## 2017-06-09 NOTE — Telephone Encounter (Signed)
Calling to follow up on DSMT

## 2017-06-14 ENCOUNTER — Ambulatory Visit (AMBULATORY_SURGERY_CENTER): Payer: Medicaid Other | Admitting: Internal Medicine

## 2017-06-14 ENCOUNTER — Encounter: Payer: Self-pay | Admitting: Internal Medicine

## 2017-06-14 VITALS — BP 143/66 | HR 66 | Temp 99.1°F | Resp 12 | Ht 65.0 in | Wt 151.0 lb

## 2017-06-14 DIAGNOSIS — D508 Other iron deficiency anemias: Secondary | ICD-10-CM

## 2017-06-14 DIAGNOSIS — K635 Polyp of colon: Secondary | ICD-10-CM | POA: Diagnosis not present

## 2017-06-14 DIAGNOSIS — K2900 Acute gastritis without bleeding: Secondary | ICD-10-CM | POA: Diagnosis not present

## 2017-06-14 DIAGNOSIS — D122 Benign neoplasm of ascending colon: Secondary | ICD-10-CM

## 2017-06-14 DIAGNOSIS — Z86010 Personal history of colonic polyps: Secondary | ICD-10-CM | POA: Diagnosis not present

## 2017-06-14 MED ORDER — SODIUM CHLORIDE 0.9 % IV SOLN: 500.0000 mL | INTRAVENOUS | Status: DC

## 2017-06-14 NOTE — Progress Notes (Signed)
Report given to PACU, vss 

## 2017-06-14 NOTE — Progress Notes (Signed)
Called to room to assist during endoscopic procedure.  Patient ID and intended procedure confirmed with present staff. Received instructions for my participation in the procedure from the performing physician.  

## 2017-06-14 NOTE — Progress Notes (Signed)
Patient states she feel much better from her nausea. Patient is smiling and talking to her responsible party.

## 2017-06-14 NOTE — Op Note (Signed)
[place] Patient Name: Angie Cain Procedure Date: 06/14/2017 2:32 PM MRN: [id number] Endoscopist: Gatha Mayer , MD Age: 61 Referring MD:  Date of Birth: [birth year] Gender: Female Account #: [id number] Procedure:                Upper GI endoscopy Indications:              Iron deficiency anemia Medicines:                Propofol per Anesthesia, Monitored Anesthesia Care Procedure:                Pre-Anesthesia Assessment:                           - Prior to the procedure, a History and Physical                            was performed, and patient medications and                            allergies were reviewed. The patient's tolerance of                            previous anesthesia was also reviewed. The risks                            and benefits of the procedure and the sedation                            options and risks were discussed with the patient.                            All questions were answered, and informed consent                            was obtained. Prior Anticoagulants: The patient has                            taken no previous anticoagulant or antiplatelet                            agents. ASA Grade Assessment: II - A patient with                            mild systemic disease. After reviewing the risks                            and benefits, the patient was deemed in                            satisfactory condition to undergo the procedure.                           After obtaining informed consent, the endoscope was  passed under direct vision. Throughout the                            procedure, the patient's blood pressure, pulse, and                            oxygen saturations were monitored continuously. The                            Endoscope was introduced through the mouth, and                            advanced to the second part of duodenum. The upper                            GI  endoscopy was accomplished without difficulty.                            The patient tolerated the procedure well. Scope In: Scope Out: Findings:                 Patchy mild inflammation characterized by erosions                            was found in the prepyloric region of the stomach.                            very superficial - ? changes from colon rep vs other                           The exam was otherwise without abnormality.                           The cardia and gastric fundus were normal on                            retroflexion. Complications:            No immediate complications. Estimated Blood Loss:     Estimated blood loss: none. Impression:               - Gastritis.                           - The examination was otherwise normal.                           - No specimens collected. Recommendation:           - Patient has a contact number available for                            emergencies. The signs and symptoms of potential                            delayed complications were discussed with the  patient. Return to normal activities tomorrow.                            Written discharge instructions were provided to the                            patient.                           - Resume previous diet.                           - Continue present medications.                           - See the other procedure note for documentation of                            additional recommendations. Gatha Mayer, MD 06/14/2017 3:08:38 PM This report has been signed electronically.

## 2017-06-14 NOTE — Progress Notes (Signed)
Zofran 4mg  IV push per Dr. Carlean Purl for complaint of nausea.

## 2017-06-14 NOTE — Patient Instructions (Addendum)
There was mild inflammation in stomach - could have been from the SuPrep you took before the colonoscopy.  There were 4 small colon polyps removed and diverticulosis.  Please continue current medications and see your PCP as planned to follow-up on the anemia.  I will let you know pathology results and when to have another routine colonoscopy by mail and/or My Chart.  I appreciate the opportunity to care for you. Gatha Mayer, MD, [facility]   Discharge instructions given. Handouts on polyps,diverticulosis and gastritis. Resume previous medications. YOU HAD AN ENDOSCOPIC PROCEDURE TODAY AT [facility]:   Refer to the procedure report that was given to you for any specific questions about what was found during the examination.  If the procedure report does not answer your questions, please call your gastroenterologist to clarify.  If you requested that your care partner not be given the details of your procedure findings, then the procedure report has been included in a sealed envelope for you to review at your convenience later.  YOU SHOULD EXPECT: Some feelings of bloating in the abdomen. Passage of more gas than usual.  Walking can help get rid of the air that was put into your GI tract during the procedure and reduce the bloating. If you had a lower endoscopy (such as a colonoscopy or flexible sigmoidoscopy) you may notice spotting of blood in your stool or on the toilet paper. If you underwent a bowel prep for your procedure, you may not have a normal bowel movement for a few days.  Please Note:  You might notice some irritation and congestion in your nose or some drainage.  This is from the oxygen used during your procedure.  There is no need for concern and it should clear up in a day or so.  SYMPTOMS TO REPORT IMMEDIATELY:   Following lower endoscopy (colonoscopy or flexible sigmoidoscopy):  Excessive amounts of blood in the stool  Significant tenderness or  worsening of abdominal pains  Swelling of the abdomen that is new, acute  Fever of 100F or higher   Following upper endoscopy (EGD)  Vomiting of blood or coffee ground material  New chest pain or pain under the shoulder blades  Painful or persistently difficult swallowing  New shortness of breath  Fever of 100F or higher  Black, tarry-looking stools  For urgent or emergent issues, a gastroenterologist can be reached at any hour by calling [phone].   DIET:  We do recommend a small meal at first, but then you may proceed to your regular diet.  Drink plenty of fluids but you should avoid alcoholic beverages for 24 hours.  ACTIVITY:  You should plan to take it easy for the rest of today and you should NOT DRIVE or use heavy machinery until tomorrow (because of the sedation medicines used during the test).    FOLLOW UP: Our staff will call the number listed on your records the next business day following your procedure to check on you and address any questions or concerns that you may have regarding the information given to you following your procedure. If we do not reach you, we will leave a message.  However, if you are feeling well and you are not experiencing any problems, there is no need to return our call.  We will assume that you have returned to your regular daily activities without incident.  If any biopsies were taken you will be contacted by phone or by letter within the next  1-3 weeks.  Please call us at [phone] if you have not heard about the biopsies in 3 weeks.    SIGNATURES/CONFIDENTIALITY: You and/or your care partner have signed paperwork which will be entered into your electronic medical record.  These signatures attest to the fact that that the information above on your After Visit Summary has been reviewed and is understood.  Full responsibility of the confidentiality of this discharge information lies with you and/or your care-partner.

## 2017-06-14 NOTE — Op Note (Signed)
[name] Procedure Date: 06/14/2017 2:31 PM MRN: [id number] Endoscopist: Gatha Mayer , MD Age: 61 Referring MD:  Date of Birth: June 29, 1956 Gender: Female Account #: [id number] Procedure:                Colonoscopy Indications:              Iron deficiency anemia Medicines:                Propofol per Anesthesia, Monitored Anesthesia Care Procedure:                Pre-Anesthesia Assessment:                           - Prior to the procedure, a History and Physical                            was performed, and patient medications and                            allergies were reviewed. The patient's tolerance of                            previous anesthesia was also reviewed. The risks                            and benefits of the procedure and the sedation                            options and risks were discussed with the patient.                            All questions were answered, and informed consent                            was obtained. Prior Anticoagulants: The patient has                            taken no previous anticoagulant or antiplatelet                            agents. ASA Grade Assessment: II - A patient with                            mild systemic disease. After reviewing the risks                            and benefits, the patient was deemed in                            satisfactory condition to undergo the procedure.                           After obtaining informed consent, the colonoscope  was passed under direct vision. Throughout the                            procedure, the patient's blood pressure, pulse, and                            oxygen saturations were monitored continuously. The                            Colonoscope was introduced through the anus and                            advanced to the the cecum, identified by                            appendiceal orifice  and ileocecal valve. The                            colonoscopy was performed without difficulty. The                            patient tolerated the procedure well. The quality                            of the bowel preparation was adequate. The bowel                            preparation used was SUPREP. The ileocecal valve,                            appendiceal orifice, and rectum were photographed. Scope In: 2:42:30 PM Scope Out: 3:01:15 PM Scope Withdrawal Time: 0 hours 15 minutes 25 seconds  Total Procedure Duration: 0 hours 18 minutes 45 seconds  Findings:                 The perianal and digital rectal examinations were                            normal.                           A diminutive polyp was found in the ascending                            colon. The polyp was sessile. The polyp was removed                            with a cold snare. Resection and retrieval were                            complete. Verification of patient identification                            for the specimen was done. Estimated blood loss was  minimal.                           Three sessile polyps were found in the ascending                            colon. The polyps were diminutive in size. These                            polyps were removed with a cold biopsy forceps.                            Resection and retrieval were complete. Verification                            of patient identification for the specimen was                            done. Estimated blood loss was minimal.                           Multiple small and large-mouthed diverticula were                            found in the sigmoid colon.                           The exam was otherwise without abnormality on                            direct and retroflexion views. Complications:            No immediate complications. Estimated Blood Loss:     Estimated blood loss was  minimal. Impression:               - One diminutive polyp in the ascending colon,                            removed with a cold snare. Resected and retrieved.                           - Three diminutive polyps in the ascending colon,                            removed with a cold biopsy forceps. Resected and                            retrieved.                           - Diverticulosis in the sigmoid colon.                           - The examination was otherwise normal on direct  and retroflexion views. Recommendation:           - Patient has a contact number available for                            emergencies. The signs and symptoms of potential                            delayed complications were discussed with the                            patient. Return to normal activities tomorrow.                            Written discharge instructions were provided to the                            patient.                           - Resume previous diet.                           - Continue present medications.                           - Repeat colonoscopy is recommended. The                            colonoscopy date will be determined after pathology                            results from today's exam become available for                            review.                           - Stay on ferrous sulfate abd f/u PCP for anemia                           If fails to respond to iron supplements will                            consider capsule endoscopy but ? if this is a                            nutritional iron-deficiency Gatha Mayer, MD 06/14/2017 3:11:56 PM This report has been signed electronically.

## 2017-06-15 ENCOUNTER — Telehealth: Payer: Self-pay

## 2017-06-15 NOTE — Telephone Encounter (Signed)
  Follow up Call-  Call back number 06/14/2017  Post procedure Call Back phone  # [phone]  Permission to leave phone message Yes  Some recent data might be hidden     Patient questions:  Do you have a fever, pain , or abdominal swelling? No. Pain Score  0 *  Have you tolerated food without any problems? Yes.    Have you been able to return to your normal activities? Yes.    Do you have any questions about your discharge instructions: Diet   No. Medications  No. Follow up visit  No.  Do you have questions or concerns about your Care? No.  Actions: * If pain score is 4 or above: No action needed, pain <4.  No problems noted per pt. maw

## 2017-06-17 ENCOUNTER — Encounter: Payer: Self-pay | Admitting: Internal Medicine

## 2017-06-17 NOTE — Progress Notes (Signed)
2 diminutive adenomas and 2 mucosal polyps Recall 2023

## 2017-06-19 ENCOUNTER — Other Ambulatory Visit: Payer: Self-pay | Admitting: Internal Medicine

## 2017-06-19 DIAGNOSIS — E1169 Type 2 diabetes mellitus with other specified complication: Secondary | ICD-10-CM

## 2017-06-19 DIAGNOSIS — E785 Hyperlipidemia, unspecified: Principal | ICD-10-CM

## 2017-07-18 ENCOUNTER — Other Ambulatory Visit: Payer: Self-pay | Admitting: Student in an Organized Health Care Education/Training Program

## 2017-07-18 DIAGNOSIS — E785 Hyperlipidemia, unspecified: Principal | ICD-10-CM

## 2017-07-18 DIAGNOSIS — E1169 Type 2 diabetes mellitus with other specified complication: Secondary | ICD-10-CM

## 2017-08-12 ENCOUNTER — Other Ambulatory Visit: Payer: Self-pay | Admitting: Dietician

## 2017-08-12 DIAGNOSIS — I10 Essential (primary) hypertension: Secondary | ICD-10-CM

## 2017-08-12 DIAGNOSIS — D509 Iron deficiency anemia, unspecified: Secondary | ICD-10-CM

## 2017-08-12 DIAGNOSIS — E1165 Type 2 diabetes mellitus with hyperglycemia: Secondary | ICD-10-CM

## 2017-08-12 DIAGNOSIS — E1169 Type 2 diabetes mellitus with other specified complication: Secondary | ICD-10-CM

## 2017-08-12 DIAGNOSIS — Z794 Long term (current) use of insulin: Principal | ICD-10-CM

## 2017-08-12 DIAGNOSIS — E785 Hyperlipidemia, unspecified: Secondary | ICD-10-CM

## 2017-08-12 DIAGNOSIS — IMO0002 Reserved for concepts with insufficient information to code with codable children: Secondary | ICD-10-CM

## 2017-08-12 NOTE — Telephone Encounter (Signed)
Patient is requesting to see her new PCP and refills.

## 2017-08-13 ENCOUNTER — Other Ambulatory Visit: Payer: Self-pay | Admitting: *Deleted

## 2017-08-13 MED ORDER — SITAGLIPTIN PHOS-METFORMIN HCL 50-1000 MG PO TABS: 1.0000 | ORAL_TABLET | Freq: Two times a day (BID) | ORAL | 2 refills | Status: DC

## 2017-08-13 MED ORDER — ATORVASTATIN CALCIUM 40 MG PO TABS: 40.0000 mg | ORAL_TABLET | Freq: Every day | ORAL | 11 refills | Status: DC

## 2017-08-13 MED ORDER — "INSULIN SYRINGE-NEEDLE U-100 31G X 15/64"" 0.3 ML MISC": 12 refills | Status: DC

## 2017-08-13 MED ORDER — FERROUS SULFATE 325 (65 FE) MG PO TBEC: 325.0000 mg | DELAYED_RELEASE_TABLET | Freq: Every day | ORAL | 2 refills | Status: DC

## 2017-08-13 MED ORDER — LISINOPRIL-HYDROCHLOROTHIAZIDE 20-25 MG PO TABS: 1.0000 | ORAL_TABLET | Freq: Every day | ORAL | 3 refills | Status: DC

## 2017-08-13 NOTE — Telephone Encounter (Signed)
Spoke with Angie Cain who has not been abel to get an appointment with her new doctor and also needs refills. Request refills, a prescription for insulin syringes and transferred her call to the front office.

## 2017-08-13 NOTE — Telephone Encounter (Signed)
Opened in error...duplicate refill request.Angie Lizaola Cassady8/31/20184:42 PM

## 2017-09-30 ENCOUNTER — Other Ambulatory Visit: Payer: Self-pay | Admitting: *Deleted

## 2017-09-30 DIAGNOSIS — K219 Gastro-esophageal reflux disease without esophagitis: Secondary | ICD-10-CM

## 2017-10-02 MED ORDER — OMEPRAZOLE 40 MG PO CPDR: DELAYED_RELEASE_CAPSULE | ORAL | 0 refills | Status: DC

## 2017-10-02 NOTE — Telephone Encounter (Signed)
Refilled 30 capsules omeprazole 40mg  to take prn. Please schedule patient follow up with me in clinic.

## 2017-10-05 NOTE — Telephone Encounter (Signed)
Will see dr Maricela Bo in nov 2018

## 2017-10-15 ENCOUNTER — Encounter: Payer: Self-pay | Admitting: Internal Medicine

## 2017-10-15 ENCOUNTER — Ambulatory Visit (INDEPENDENT_AMBULATORY_CARE_PROVIDER_SITE_OTHER): Payer: Medicaid Other | Admitting: Internal Medicine

## 2017-10-15 ENCOUNTER — Encounter (INDEPENDENT_AMBULATORY_CARE_PROVIDER_SITE_OTHER): Payer: Self-pay

## 2017-10-15 VITALS — BP 148/80 | HR 77 | Temp 98.3°F | Ht 65.0 in | Wt 155.3 lb

## 2017-10-15 DIAGNOSIS — E785 Hyperlipidemia, unspecified: Secondary | ICD-10-CM

## 2017-10-15 DIAGNOSIS — Z79899 Other long term (current) drug therapy: Secondary | ICD-10-CM | POA: Diagnosis not present

## 2017-10-15 DIAGNOSIS — E119 Type 2 diabetes mellitus without complications: Secondary | ICD-10-CM

## 2017-10-15 DIAGNOSIS — M7542 Impingement syndrome of left shoulder: Secondary | ICD-10-CM | POA: Diagnosis not present

## 2017-10-15 DIAGNOSIS — R112 Nausea with vomiting, unspecified: Secondary | ICD-10-CM | POA: Diagnosis not present

## 2017-10-15 DIAGNOSIS — J309 Allergic rhinitis, unspecified: Secondary | ICD-10-CM | POA: Diagnosis not present

## 2017-10-15 DIAGNOSIS — Z794 Long term (current) use of insulin: Secondary | ICD-10-CM

## 2017-10-15 DIAGNOSIS — F329 Major depressive disorder, single episode, unspecified: Secondary | ICD-10-CM | POA: Diagnosis not present

## 2017-10-15 DIAGNOSIS — F320 Major depressive disorder, single episode, mild: Secondary | ICD-10-CM

## 2017-10-15 DIAGNOSIS — F17210 Nicotine dependence, cigarettes, uncomplicated: Secondary | ICD-10-CM | POA: Diagnosis not present

## 2017-10-15 DIAGNOSIS — E1159 Type 2 diabetes mellitus with other circulatory complications: Secondary | ICD-10-CM

## 2017-10-15 DIAGNOSIS — R42 Dizziness and giddiness: Secondary | ICD-10-CM | POA: Diagnosis not present

## 2017-10-15 DIAGNOSIS — I10 Essential (primary) hypertension: Secondary | ICD-10-CM

## 2017-10-15 DIAGNOSIS — K219 Gastro-esophageal reflux disease without esophagitis: Secondary | ICD-10-CM | POA: Diagnosis not present

## 2017-10-15 DIAGNOSIS — M75102 Unspecified rotator cuff tear or rupture of left shoulder, not specified as traumatic: Secondary | ICD-10-CM

## 2017-10-15 DIAGNOSIS — M159 Polyosteoarthritis, unspecified: Secondary | ICD-10-CM | POA: Diagnosis not present

## 2017-10-15 DIAGNOSIS — I152 Hypertension secondary to endocrine disorders: Secondary | ICD-10-CM

## 2017-10-15 DIAGNOSIS — IMO0001 Reserved for inherently not codable concepts without codable children: Secondary | ICD-10-CM

## 2017-10-15 DIAGNOSIS — F322 Major depressive disorder, single episode, severe without psychotic features: Secondary | ICD-10-CM

## 2017-10-15 LAB — GLUCOSE, CAPILLARY: Glucose-Capillary: 297 mg/dL — ABNORMAL HIGH (ref 65–99)

## 2017-10-15 LAB — POCT GLYCOSYLATED HEMOGLOBIN (HGB A1C): Hemoglobin A1C: 9.1

## 2017-10-15 MED ORDER — BUPROPION HCL 100 MG PO TABS: 100.0000 mg | ORAL_TABLET | Freq: Two times a day (BID) | ORAL | 0 refills | Status: DC

## 2017-10-15 NOTE — Progress Notes (Signed)
   CC: Please follow-up  HPI:  [name] is a 61 y.o. with past medical history of arthritis, diabetes mellitus, GERD, hyperlipidemia, hypertension who presents for diabetes follow-up. Please see problem based charting for evaluation, assessment, and plan.   Past Medical History:  Diagnosis Date  . Allergy   . Anxiety    occasional  . Arthritis   . Atherosclerotic peripheral vascular disease with intermittent claudication ([place])    s/p fem-fem bypass 2011. ABI 07/2012 0.5 R 0.65 L  . Diabetes mellitus without complication ([place])   . GERD (gastroesophageal reflux disease)   . Hyperlipidemia   . Hypertension   . Personal history of colonic polyps - adenomas 03/07/2014  . Tobacco abuse    Review of Systems:    Review of Systems  Respiratory: Positive for cough. Negative for shortness of breath.   Gastrointestinal: Positive for heartburn, nausea and vomiting. Negative for abdominal pain and diarrhea.  Musculoskeletal: Positive for joint pain.  Neurological: Positive for dizziness.  Psychiatric/Behavioral: Positive for depression.   Physical Exam:  Vitals:   10/15/17 1513  BP: (!) 148/80  Pulse: 77  Temp: 98.3 F (36.8 C)  TempSrc: Oral  SpO2: 100%  Weight: 155 lb 4.8 oz (70.4 kg)  Height: 5\' 5"  (1.651 m)   Physical Exam  Constitutional: She appears well-developed and well-nourished. No distress.  HENT:  Head: Normocephalic and atraumatic.  Cardiovascular: Normal rate, regular rhythm and normal heart sounds.   Pulmonary/Chest: Effort normal and breath sounds normal. No respiratory distress. She has no wheezes.  Abdominal: Soft. Bowel sounds are normal. She exhibits no distension. There is no tenderness.  Musculoskeletal: She exhibits tenderness.       Left shoulder: She exhibits decreased range of motion and tenderness.       Left elbow: She exhibits decreased range of motion. She exhibits no swelling, no effusion, no deformity and no laceration. Tenderness  found.  The patient had good range of motion in both upper extremities and her sensation was intact. The patient however states that he has movement in either direction extreme flexion, extension, adduction, and abduction  Psychiatric: She has a normal mood and affect. Her behavior is normal. Judgment and thought content normal.     Assessment & Plan:   See Encounters Tab for problem based charting.  Patient seen with Dr. Rebeca Alert

## 2017-10-15 NOTE — Patient Instructions (Signed)
It was a pleasure to see you today [name]. Please make the following changes:  -Start Bupropion for mood -Follow with counseling -Please record blood glucose readings when fasting and an hour after eating  -Follow with orthopedics -Take over the counter antihistamines  If you have any questions or concerns, please call our clinic at [phone] between 9am-5pm and after hours call [phone] and ask for the internal medicine resident on call. If you feel you are having a medical emergency please call 911.   Thank you, we look forward to help you remain healthy!  Angie Mage, MD Internal Medicine PGY1   Bupropion extended-release tablets (Depression/Mood Disorders) What is this medicine? BUPROPION (byoo PROE pee on) is used to treat depression. This medicine may be used for other purposes; ask your health care provider or pharmacist if you have questions. COMMON BRAND NAME(S): Aplenzin, Budeprion XL, Forfivo XL, Wellbutrin XL What should I tell my health care provider before I take this medicine? They need to know if you have any of these conditions: -an eating disorder, such as anorexia or bulimia -bipolar disorder or psychosis -diabetes or high blood sugar, treated with medication -glaucoma -head injury or brain tumor -heart disease, previous heart attack, or irregular heart beat -high blood pressure -kidney or liver disease -seizures (convulsions) -suicidal thoughts or a previous suicide attempt -Tourette's syndrome -weight loss -an unusual or allergic reaction to bupropion, other medicines, foods, dyes, or preservatives -breast-feeding -pregnant or trying to become pregnant How should I use this medicine? Take this medicine by mouth with a glass of water. Follow the directions on the prescription label. You can take it with or without food. If it upsets your stomach, take it with food. Do not crush, chew, or cut these tablets. This medicine is taken once daily at  the same time each day. Do not take your medicine more often than directed. Do not stop taking this medicine suddenly except upon the advice of your doctor. Stopping this medicine too quickly may cause serious side effects or your condition may worsen. A special MedGuide will be given to you by the pharmacist with each prescription and refill. Be sure to read this information carefully each time. Talk to your pediatrician regarding the use of this medicine in children. Special care may be needed. Overdosage: If you think you have taken too much of this medicine contact a poison control center or emergency room at once. NOTE: This medicine is only for you. Do not share this medicine with others. What if I miss a dose? If you miss a dose, skip the missed dose and take your next tablet at the regular time. Do not take double or extra doses. What may interact with this medicine? Do not take this medicine with any of the following medications: -linezolid -MAOIs like Azilect, Carbex, Eldepryl, Marplan, Nardil, and Parnate -methylene blue (injected into a vein) -other medicines that contain bupropion like Zyban This medicine may also interact with the following medications: -alcohol -certain medicines for anxiety or sleep -certain medicines for blood pressure like metoprolol, propranolol -certain medicines for depression or psychotic disturbances -certain medicines for HIV or AIDS like efavirenz, lopinavir, nelfinavir, ritonavir -certain medicines for irregular heart beat like propafenone, flecainide -certain medicines for Parkinson's disease like amantadine, levodopa -certain medicines for seizures like carbamazepine, phenytoin, phenobarbital -cimetidine -clopidogrel -cyclophosphamide -digoxin -furazolidone -isoniazid -nicotine -orphenadrine -procarbazine -steroid medicines like prednisone or cortisone -stimulant medicines for attention disorders, weight loss, or to stay  awake -tamoxifen -theophylline -thiotepa -ticlopidine -  tramadol -warfarin This list may not describe all possible interactions. Give your health care provider a list of all the medicines, herbs, non-prescription drugs, or dietary supplements you use. Also tell them if you smoke, drink alcohol, or use illegal drugs. Some items may interact with your medicine. What should I watch for while using this medicine? Tell your doctor if your symptoms do not get better or if they get worse. Visit your doctor or health care professional for regular checks on your progress. Because it may take several weeks to see the full effects of this medicine, it is important to continue your treatment as prescribed by your doctor. Patients and their families should watch out for new or worsening thoughts of suicide or depression. Also watch out for sudden changes in feelings such as feeling anxious, agitated, panicky, irritable, hostile, aggressive, impulsive, severely restless, overly excited and hyperactive, or not being able to sleep. If this happens, especially at the beginning of treatment or after a change in dose, call your health care professional. Avoid alcoholic drinks while taking this medicine. Drinking large amounts of alcoholic beverages, using sleeping or anxiety medicines, or quickly stopping the use of these agents while taking this medicine may increase your risk for a seizure. Do not drive or use heavy machinery until you know how this medicine affects you. This medicine can impair your ability to perform these tasks. Do not take this medicine close to bedtime. It may prevent you from sleeping. Your mouth may get dry. Chewing sugarless gum or sucking hard candy, and drinking plenty of water may help. Contact your doctor if the problem does not go away or is severe. The tablet shell for some brands of this medicine does not dissolve. This is normal. The tablet shell may appear whole in the stool. This is  not a cause for concern. What side effects may I notice from receiving this medicine? Side effects that you should report to your doctor or health care professional as soon as possible: -allergic reactions like skin rash, itching or hives, swelling of the face, lips, or tongue -breathing problems -changes in vision -confusion -elevated mood, decreased need for sleep, racing thoughts, impulsive behavior -fast or irregular heartbeat -hallucinations, loss of contact with reality -increased blood pressure -redness, blistering, peeling or loosening of the skin, including inside the mouth -seizures -suicidal thoughts or other mood changes -unusually weak or tired -vomiting Side effects that usually do not require medical attention (report to your doctor or health care professional if they continue or are bothersome): -constipation -headache -loss of appetite -nausea -tremors -weight loss This list may not describe all possible side effects. Call your doctor for medical advice about side effects. You may report side effects to FDA at 1-800-FDA-1088. Where should I keep my medicine? Keep out of the reach of children. Store at room temperature between 15 and 30 degrees C (59 and 86 degrees F). Throw away any unused medicine after the expiration date. NOTE: This sheet is a summary. It may not cover all possible information. If you have questions about this medicine, talk to your doctor, pharmacist, or health care provider.  2018 Elsevier/Gold Standard (2016-05-22 13:55:13)    Allergic Rhinitis Allergic rhinitis is when the mucous membranes in the nose respond to allergens. Allergens are particles in the air that cause your body to have an allergic reaction. This causes you to release allergic antibodies. Through a chain of events, these eventually cause you to release histamine into the blood stream. Although  meant to protect the body, it is this release of histamine that causes your  discomfort, such as frequent sneezing, congestion, and an itchy, runny nose. What are the causes? Seasonal allergic rhinitis (hay fever) is caused by pollen allergens that may come from grasses, trees, and weeds. Year-round allergic rhinitis (perennial allergic rhinitis) is caused by allergens such as house dust mites, pet dander, and mold spores. What are the signs or symptoms?  Nasal stuffiness (congestion).  Itchy, runny nose with sneezing and tearing of the eyes. How is this diagnosed? Your health care provider can help you determine the allergen or allergens that trigger your symptoms. If you and your health care provider are unable to determine the allergen, skin or blood testing may be used. Your health care provider will diagnose your condition after taking your health history and performing a physical exam. Your health care provider may assess you for other related conditions, such as asthma, pink eye, or an ear infection. How is this treated? Allergic rhinitis does not have a cure, but it can be controlled by:  Medicines that block allergy symptoms. These may include allergy shots, nasal sprays, and oral antihistamines.  Avoiding the allergen.  Hay fever may often be treated with antihistamines in pill or nasal spray forms. Antihistamines block the effects of histamine. There are over-the-counter medicines that may help with nasal congestion and swelling around the eyes. Check with your health care provider before taking or giving this medicine. If avoiding the allergen or the medicine prescribed do not work, there are many new medicines your health care provider can prescribe. Stronger medicine may be used if initial measures are ineffective. Desensitizing injections can be used if medicine and avoidance does not work. Desensitization is when a patient is given ongoing shots until the body becomes less sensitive to the allergen. Make sure you follow up with your health care provider if  problems continue. Follow these instructions at home: It is not possible to completely avoid allergens, but you can reduce your symptoms by taking steps to limit your exposure to them. It helps to know exactly what you are allergic to so that you can avoid your specific triggers. Contact a health care provider if:  You have a fever.  You develop a cough that does not stop easily (persistent).  You have shortness of breath.  You start wheezing.  Symptoms interfere with normal daily activities. This information is not intended to replace advice given to you by your health care provider. Make sure you discuss any questions you have with your health care provider. Document Released: 08/25/2001 Document Revised: 07/31/2016 Document Reviewed: 08/07/2013 Elsevier Interactive Patient Education  2017 [employer].

## 2017-10-16 DIAGNOSIS — F329 Major depressive disorder, single episode, unspecified: Secondary | ICD-10-CM | POA: Insufficient documentation

## 2017-10-16 DIAGNOSIS — J309 Allergic rhinitis, unspecified: Secondary | ICD-10-CM | POA: Insufficient documentation

## 2017-10-16 NOTE — Assessment & Plan Note (Signed)
The patient states that she has been having a productive cough for the past few weeks that is intermittent and occurs particularly at night. She states that she notices the cough when she lays down. She feels that she coughs to clear something up. She feels something tickle/scratch at the back of her throat. The patient states that she gets acid reflux . She denies sore throat or sour taste in the mouth.   The patient has noticed redness and mucus in the eye that makes her eyelashes stick together. For these symptoms the patient mentioned that she takes an over the counter antihistamines. The patient's cough and symptoms are likely due to allergic rhinitis.   -Recommended the patient continue over the counter antihistamines.

## 2017-10-16 NOTE — Assessment & Plan Note (Signed)
The patient states that she feels depressed. She has been having a hard time recently due to stressors. The patient's PHQ9=14.   -Prescribed Bupropion 100mg  bid -Follow up in 1 month

## 2017-10-16 NOTE — Assessment & Plan Note (Signed)
Patient's blood pressure during this visit was 148/80, repeat blood pressure.  Patient is currently taking lisinopril-hydrochlorothiazide 20-25 daily.  -The patient mentions that she has neglected her diet and exercise due to life stressors, but she plans on restarting.  -Continue lisinopril-hctz 20-25mg  qd

## 2017-10-16 NOTE — Assessment & Plan Note (Signed)
His A1c was 9.1, glucose 293.  Last A1c was 8.3 on 03/18/2017.  Patient is currently taking Janumet 50-1000mg  twice daily, Lantus 24 units nightly.  Patient is compliant occasional.  She states that her blood glucose readings have been elevated in the 300s when she records them. The patient states that she has been stressed out and that has been causing the patient to not take medication at the correct times she should. The patient also mentions that she has not been following a healthy diet and exercise.   -Recommended that the patient diet and exercise -Continue Janumet 50-1000mg  bid -Continue lantus 24u qhs -Requested the patient to take fasting and blood glucose measurements 1 hr after eating -Referral to ophthalmology -Follow up in 1 month

## 2017-10-16 NOTE — Assessment & Plan Note (Signed)
The patient states that she has been having pain in her left arm for several years now. She has received steroid injections in the past, but they have not helped. The most recent steroid injection was in May 2018.   The patient's left arm pain is extreme. The patient was previously present in the left shoulder, but now the pain is predominant in the left upper arm.   -Referral to orthopedics -Tylenol for pain

## 2017-10-18 NOTE — Progress Notes (Signed)
Internal Medicine Clinic Attending  I saw and evaluated the patient.  I personally confirmed the key portions of the history and exam documented by Dr. Maricela Bo and I reviewed pertinent patient test results.  The assessment, diagnosis, and plan were formulated together and I agree with the documentation in the resident's note.  Here for multiple issues today. Shoulder pain has returned, although primary location of pain is more upper arm, primarily over biceps and biceps tendon. She requested another injection, but this would be her 3rd injection today, and I would prefer she be evaluated by a specialist given the recurrence of symptoms despite conservative therapy.   Although diabetes and hypertension are poorly controlled today, she admits to not following her usual diet and routine and also has not been measuring her sugar. She is planning to resume her diet and routine and check her sugar and return to recheck with her glucose log and adjust as needed at that time.  We also will restart antidepressant for her, and hopefully get her in counseling as well.  Oda Kilts, MD

## 2017-11-01 ENCOUNTER — Other Ambulatory Visit: Payer: Self-pay | Admitting: *Deleted

## 2017-11-01 DIAGNOSIS — Z794 Long term (current) use of insulin: Principal | ICD-10-CM

## 2017-11-01 DIAGNOSIS — IMO0002 Reserved for concepts with insufficient information to code with codable children: Secondary | ICD-10-CM

## 2017-11-01 DIAGNOSIS — E1165 Type 2 diabetes mellitus with hyperglycemia: Secondary | ICD-10-CM

## 2017-11-03 MED ORDER — SITAGLIPTIN PHOS-METFORMIN HCL 50-1000 MG PO TABS: 1.0000 | ORAL_TABLET | Freq: Two times a day (BID) | ORAL | 2 refills | Status: DC

## 2017-11-03 NOTE — Telephone Encounter (Signed)
Refilled Janumet for 2 months

## 2017-11-09 ENCOUNTER — Telehealth: Payer: Self-pay | Admitting: *Deleted

## 2017-11-09 ENCOUNTER — Other Ambulatory Visit: Payer: Self-pay | Admitting: *Deleted

## 2017-11-09 DIAGNOSIS — D509 Iron deficiency anemia, unspecified: Secondary | ICD-10-CM

## 2017-11-09 DIAGNOSIS — K219 Gastro-esophageal reflux disease without esophagitis: Secondary | ICD-10-CM

## 2017-11-09 DIAGNOSIS — E1165 Type 2 diabetes mellitus with hyperglycemia: Secondary | ICD-10-CM

## 2017-11-09 DIAGNOSIS — Z794 Long term (current) use of insulin: Principal | ICD-10-CM

## 2017-11-09 DIAGNOSIS — IMO0002 Reserved for concepts with insufficient information to code with codable children: Secondary | ICD-10-CM

## 2017-11-09 NOTE — Telephone Encounter (Signed)
Should I put in an order now or after she gets prior authorization?

## 2017-11-09 NOTE — Telephone Encounter (Signed)
Prior Authorization for Janumet 50-1000 was sent to [facility].  Approved 11/09/2017 through 11/09/2018.  Sander Nephew, RN 11/09/2017 11:51 AM

## 2017-11-09 NOTE — Telephone Encounter (Signed)
Also requesting refill on Janumet - which needed PA; pt informed.

## 2017-11-09 NOTE — Telephone Encounter (Addendum)
Please place a refill order on Lantus now- does not need PA. Angie Cain had done PA on Januet. Thanks

## 2017-11-11 MED ORDER — INSULIN GLARGINE 100 UNIT/ML ~~LOC~~ SOLN: 24.0000 [IU] | Freq: Every day | SUBCUTANEOUS | 0 refills | Status: DC

## 2017-11-11 MED ORDER — FERROUS SULFATE 325 (65 FE) MG PO TBEC: 325.0000 mg | DELAYED_RELEASE_TABLET | Freq: Every day | ORAL | 0 refills | Status: DC

## 2017-11-11 MED ORDER — OMEPRAZOLE 40 MG PO CPDR: DELAYED_RELEASE_CAPSULE | ORAL | 0 refills | Status: DC

## 2017-11-11 NOTE — Telephone Encounter (Signed)
Angie Cain calls saying she needs refills on Calcium iron, omeprazole, janumet and lantus and is having a hard time getting them. I told her I would speak to the triage nurse who is working on the lantus and janument refills.

## 2017-11-11 NOTE — Telephone Encounter (Signed)
Refilled patient's lantus for 60 days, iron, omeprazole. The patient's janumet was filled on 11/03/17

## 2017-11-11 NOTE — Telephone Encounter (Signed)
Notify py of refills. Stated she will wait on a call from [place] to see if Janumet along with the other meds has been  Refilled. If not, she will call back.

## 2017-11-24 ENCOUNTER — Telehealth: Payer: Self-pay | Admitting: *Deleted

## 2017-11-24 NOTE — Telephone Encounter (Signed)
Pt presents stating she has not been able to get janumet from [facility], [address], called pharm and spoke to pharmacist, she states that PA was for ER not regular, spoke to [place]. She will call [place] [facility] and verify. Will do this today

## 2017-11-24 NOTE — Telephone Encounter (Signed)
Call to [facility].  Spoke with representative about the Janumet 50-1000 tablets.  PA was rerun and approved PA [id number]. 11/24/2017 thru 11/19/2017.  I code is [id number]. [place]  was called and notified of.  Patient was called and message left that the Janumet will be ready for pick up today after 7:00 PM.  Sander Nephew, RN 11/24/2017 5:07 PM

## 2017-12-22 ENCOUNTER — Other Ambulatory Visit: Payer: Self-pay | Admitting: *Deleted

## 2017-12-22 DIAGNOSIS — K219 Gastro-esophageal reflux disease without esophagitis: Secondary | ICD-10-CM

## 2017-12-22 DIAGNOSIS — Z794 Long term (current) use of insulin: Secondary | ICD-10-CM

## 2017-12-22 DIAGNOSIS — IMO0002 Reserved for concepts with insufficient information to code with codable children: Secondary | ICD-10-CM

## 2017-12-22 DIAGNOSIS — F322 Major depressive disorder, single episode, severe without psychotic features: Secondary | ICD-10-CM

## 2017-12-22 DIAGNOSIS — M8580 Other specified disorders of bone density and structure, unspecified site: Secondary | ICD-10-CM

## 2017-12-22 DIAGNOSIS — E1165 Type 2 diabetes mellitus with hyperglycemia: Secondary | ICD-10-CM

## 2017-12-23 MED ORDER — BUPROPION HCL 100 MG PO TABS: 100.0000 mg | ORAL_TABLET | Freq: Two times a day (BID) | ORAL | 1 refills | Status: DC

## 2017-12-23 MED ORDER — INSULIN GLARGINE 100 UNIT/ML ~~LOC~~ SOLN: 24.0000 [IU] | Freq: Every day | SUBCUTANEOUS | 0 refills | Status: DC

## 2017-12-23 MED ORDER — CALCIUM CARBONATE-VITAMIN D 500-200 MG-UNIT PO TABS: 1.0000 | ORAL_TABLET | Freq: Two times a day (BID) | ORAL | 3 refills | Status: DC

## 2017-12-23 MED ORDER — OMEPRAZOLE 40 MG PO CPDR: DELAYED_RELEASE_CAPSULE | ORAL | 1 refills | Status: DC

## 2018-02-07 ENCOUNTER — Other Ambulatory Visit: Payer: Self-pay | Admitting: Internal Medicine

## 2018-02-07 DIAGNOSIS — Z794 Long term (current) use of insulin: Principal | ICD-10-CM

## 2018-02-07 DIAGNOSIS — IMO0002 Reserved for concepts with insufficient information to code with codable children: Secondary | ICD-10-CM

## 2018-02-07 DIAGNOSIS — E1165 Type 2 diabetes mellitus with hyperglycemia: Secondary | ICD-10-CM

## 2018-02-09 ENCOUNTER — Encounter (HOSPITAL_COMMUNITY): Payer: Self-pay

## 2018-02-09 ENCOUNTER — Emergency Department (HOSPITAL_COMMUNITY)
Admission: EM | Admit: 2018-02-09 | Discharge: 2018-02-10 | Disposition: A | Discharge status: Home / Self Care | Payer: Medicaid Other | Attending: Emergency Medicine | Admitting: Emergency Medicine

## 2018-02-09 DIAGNOSIS — R109 Unspecified abdominal pain: Secondary | ICD-10-CM | POA: Diagnosis present

## 2018-02-09 DIAGNOSIS — E119 Type 2 diabetes mellitus without complications: Secondary | ICD-10-CM | POA: Diagnosis not present

## 2018-02-09 DIAGNOSIS — N390 Urinary tract infection, site not specified: Secondary | ICD-10-CM | POA: Insufficient documentation

## 2018-02-09 DIAGNOSIS — Z7982 Long term (current) use of aspirin: Secondary | ICD-10-CM | POA: Diagnosis not present

## 2018-02-09 DIAGNOSIS — Z79899 Other long term (current) drug therapy: Secondary | ICD-10-CM | POA: Insufficient documentation

## 2018-02-09 DIAGNOSIS — F17210 Nicotine dependence, cigarettes, uncomplicated: Secondary | ICD-10-CM | POA: Insufficient documentation

## 2018-02-09 DIAGNOSIS — Z794 Long term (current) use of insulin: Secondary | ICD-10-CM | POA: Insufficient documentation

## 2018-02-09 DIAGNOSIS — R112 Nausea with vomiting, unspecified: Secondary | ICD-10-CM | POA: Diagnosis not present

## 2018-02-09 DIAGNOSIS — I10 Essential (primary) hypertension: Secondary | ICD-10-CM | POA: Insufficient documentation

## 2018-02-09 LAB — COMPREHENSIVE METABOLIC PANEL
ALBUMIN: 4.3 g/dL (ref 3.5–5.0)
ALT: 8 U/L — AB (ref 14–54)
AST: 13 U/L — ABNORMAL LOW (ref 15–41)
Alkaline Phosphatase: 70 U/L (ref 38–126)
Anion gap: 11 (ref 5–15)
BUN: 14 mg/dL (ref 6–20)
CALCIUM: 9.5 mg/dL (ref 8.9–10.3)
CHLORIDE: 103 mmol/L (ref 101–111)
CO2: 26 mmol/L (ref 22–32)
CREATININE: 0.74 mg/dL (ref 0.44–1.00)
GFR calc Af Amer: >60 mL/min (ref >60)
GFR calc non Af Amer: >60 mL/min (ref >60)
GLUCOSE: 255 mg/dL — AB (ref 65–99)
Potassium: 3.2 mmol/L — ABNORMAL LOW (ref 3.5–5.1)
SODIUM: 140 mmol/L (ref 135–145)
Total Bilirubin: 0.9 mg/dL (ref 0.3–1.2)
Total Protein: 8 g/dL (ref 6.5–8.1)

## 2018-02-09 LAB — CBC
HCT: 38.9 % (ref 36.0–46.0)
HEMOGLOBIN: 12.8 g/dL (ref 12.0–15.0)
MCH: 25.1 pg — AB (ref 26.0–34.0)
MCHC: 32.9 g/dL (ref 30.0–36.0)
MCV: 76.4 fL — AB (ref 78.0–100.0)
Platelets: 335 10*3/uL (ref 150–400)
RBC: 5.09 MIL/uL (ref 3.87–5.11)
RDW: 14.7 % (ref 11.5–15.5)
WBC: 9.8 10*3/uL (ref 4.0–10.5)

## 2018-02-09 LAB — LIPASE, BLOOD: LIPASE: 25 U/L (ref 11–51)

## 2018-02-09 MED ORDER — MORPHINE SULFATE (PF) 4 MG/ML IV SOLN
4.0000 mg | Freq: Once | INTRAVENOUS | Status: AC
  Administered 2018-02-09: 4 mg via INTRAVENOUS
  Filled 2018-02-09: qty 1

## 2018-02-09 MED ORDER — SODIUM CHLORIDE 0.9 % IV BOLUS (SEPSIS)
1000.0000 mL | Freq: Once | INTRAVENOUS | Status: AC
  Administered 2018-02-09: 1000 mL via INTRAVENOUS

## 2018-02-09 MED ORDER — ONDANSETRON HCL 4 MG/2ML IJ SOLN
4.0000 mg | Freq: Once | INTRAMUSCULAR | Status: AC
  Administered 2018-02-09: 4 mg via INTRAVENOUS
  Filled 2018-02-09: qty 2

## 2018-02-09 MED ORDER — ONDANSETRON 4 MG PO TBDP
4.0000 mg | ORAL_TABLET | Freq: Once | ORAL | Status: AC | PRN
  Administered 2018-02-09: 4 mg via ORAL
  Filled 2018-02-09: qty 1

## 2018-02-09 NOTE — ED Triage Notes (Signed)
Pt complains of right upper quad pain and nausea for one week

## 2018-02-10 ENCOUNTER — Emergency Department (HOSPITAL_COMMUNITY): Payer: Medicaid Other

## 2018-02-10 ENCOUNTER — Encounter (HOSPITAL_COMMUNITY): Payer: Self-pay

## 2018-02-10 LAB — URINALYSIS, ROUTINE W REFLEX MICROSCOPIC
Bilirubin Urine: NEGATIVE
Ketones, ur: 20 mg/dL — AB
Nitrite: NEGATIVE
PROTEIN: 100 mg/dL — AB
Specific Gravity, Urine: 1.025 (ref 1.005–1.030)
pH: 8 (ref 5.0–8.0)

## 2018-02-10 MED ORDER — SODIUM CHLORIDE 0.9 % IV SOLN
2.0000 g | Freq: Once | INTRAVENOUS | Status: AC
  Administered 2018-02-10: 2 g via INTRAVENOUS
  Filled 2018-02-10: qty 20

## 2018-02-10 MED ORDER — POTASSIUM CHLORIDE CRYS ER 20 MEQ PO TBCR
40.0000 meq | EXTENDED_RELEASE_TABLET | Freq: Once | ORAL | Status: AC
  Administered 2018-02-10: 40 meq via ORAL
  Filled 2018-02-10: qty 2

## 2018-02-10 MED ORDER — PROMETHAZINE HCL 25 MG PO TABS
25.0000 mg | ORAL_TABLET | Freq: Once | ORAL | Status: AC
Start: 2018-02-10 — End: 2018-02-10
  Administered 2018-02-10: 25 mg via ORAL
  Filled 2018-02-10: qty 1

## 2018-02-10 MED ORDER — ONDANSETRON 4 MG PO TBDP: 4.0000 mg | ORAL_TABLET | Freq: Three times a day (TID) | ORAL | 0 refills | Status: DC | PRN

## 2018-02-10 MED ORDER — SODIUM CHLORIDE 0.9 % IJ SOLN
INTRAMUSCULAR | Status: AC
  Filled 2018-02-10: qty 50

## 2018-02-10 MED ORDER — SODIUM CHLORIDE 0.9 % IV BOLUS (SEPSIS)
1000.0000 mL | Freq: Once | INTRAVENOUS | Status: AC
  Administered 2018-02-10: 1000 mL via INTRAVENOUS

## 2018-02-10 MED ORDER — POTASSIUM CHLORIDE CRYS ER 20 MEQ PO TBCR: 40.0000 meq | EXTENDED_RELEASE_TABLET | Freq: Two times a day (BID) | ORAL | 0 refills | Status: DC

## 2018-02-10 MED ORDER — PROMETHAZINE HCL 25 MG RE SUPP: 25.0000 mg | Freq: Four times a day (QID) | RECTAL | 0 refills | Status: DC | PRN

## 2018-02-10 MED ORDER — CEPHALEXIN 500 MG PO CAPS: 500.0000 mg | ORAL_CAPSULE | Freq: Four times a day (QID) | ORAL | 0 refills | Status: AC

## 2018-02-10 MED ORDER — ONDANSETRON HCL 4 MG/2ML IJ SOLN
4.0000 mg | Freq: Once | INTRAMUSCULAR | Status: AC
  Administered 2018-02-10: 4 mg via INTRAVENOUS
  Filled 2018-02-10: qty 2

## 2018-02-10 MED ORDER — IOPAMIDOL (ISOVUE-300) INJECTION 61%
INTRAVENOUS | Status: AC
  Administered 2018-02-10: 100 mL via INTRAVENOUS
  Filled 2018-02-10: qty 100

## 2018-02-10 MED ORDER — GI COCKTAIL ~~LOC~~
30.0000 mL | Freq: Once | ORAL | Status: AC
  Administered 2018-02-10: 30 mL via ORAL
  Filled 2018-02-10: qty 30

## 2018-02-10 MED ORDER — IOPAMIDOL (ISOVUE-300) INJECTION 61%
100.0000 mL | Freq: Once | INTRAVENOUS | Status: AC | PRN
  Administered 2018-02-10: 100 mL via INTRAVENOUS

## 2018-02-10 NOTE — ED Notes (Signed)
Patient transported to CT 

## 2018-02-10 NOTE — ED Provider Notes (Signed)
Assumed care from Dr. Billy Fischer at 12:27 AM. Briefly, the patient is a 62 y.o. female with PMHx of  has a past medical history of Allergy, Anxiety, Arthritis, Atherosclerotic peripheral vascular disease with intermittent claudication ([place]), Diabetes mellitus without complication ([place]), GERD (gastroesophageal reflux disease), Hyperlipidemia, Hypertension, Personal history of colonic polyps - adenomas (03/07/2014), and Tobacco abuse. here with right-sided abd pain x 1 week. H/o fem-fem bypass but pain is not c/w mesenteric ischemia. CT scan is pending.   Labs Reviewed  COMPREHENSIVE METABOLIC PANEL - Abnormal; Notable for the following components:      Result Value   Potassium 3.2 (*)    Glucose, Bld 255 (*)    AST 13 (*)    ALT 8 (*)    All other components within normal limits  CBC - Abnormal; Notable for the following components:   MCV 76.4 (*)    MCH 25.1 (*)    All other components within normal limits  LIPASE, BLOOD  URINALYSIS, ROUTINE W REFLEX MICROSCOPIC    Course of Care: -CT scan shows no significant surgical abnormality.  Of note, urinalysis shows possible UTI the patient has urinary frequency and urgency.  Some of her symptoms could be explained by mild, early pyelo though she has no overt CVA tenderness.  Patient given fluids, Rocephin, and now feels markedly improved.  Her nausea has resolved.  She has not vomited and is tolerated p.o. intake in the ED. She would like to attempt outpt management at home which I feel is reasonable.  Will add on urine culture, placed on Keflex, give antiemetics, and discharged home.     Duffy Bruce, MD 02/10/18 [phone]

## 2018-02-10 NOTE — ED Provider Notes (Signed)
[facility] DEPT Provider Note   CSN: [id number] Arrival date & time: 02/09/18  2142     History   Chief Complaint Chief Complaint  Patient presents with  . Abdominal Pain    HPI Kourtlynn Trevor is a 62 y.o. female.  HPI   62 year old female with a history of hypertension, DM, hyperlipidemia, femorofemoral bypass, anxiety and depression presents with concern for abdominal pain for 1 week.  Reports she has had both right lower and right upper abdominal pain that has been constant over the last week.  Describes it as severe, 10 out of 10 pain, and sharp and burning in nature.  Reports nothing in particular seems to make it better or worse.  Reports that she has been unable to eat much over the last week.  Also reports she had nausea and vomiting.  Denies diarrhea or constipation.  Denies black or bloody stools.  Reports she has had some tingling with urination, but denies other dysuria or flank pain.  Denies known fevers.  Past Medical History:  Diagnosis Date  . Allergy   . Anxiety    occasional  . Arthritis   . Atherosclerotic peripheral vascular disease with intermittent claudication ([place])    s/p fem-fem bypass 2011. ABI 07/2012 0.5 R 0.65 L  . Diabetes mellitus without complication ([place])   . GERD (gastroesophageal reflux disease)   . Hyperlipidemia   . Hypertension   . Personal history of colonic polyps - adenomas 03/07/2014  . Tobacco abuse     Patient Active Problem List   Diagnosis Date Noted  . Allergic rhinitis 10/16/2017  . Major depression 10/16/2017  . Iron deficiency anemia 05/08/2017  . Healthcare maintenance 03/18/2017  . Eye discharge 07/31/2016  . Rotator cuff impingement syndrome of left shoulder 03/23/2016  . Osteopenia determined by x-ray 01/25/2015  . Bilateral plantar fasciitis 09/30/2014  . Hx of adenomatous colonic polyps 03/07/2014  . Low back pain 12/18/2013  . GERD (gastroesophageal reflux disease) 06/06/2013    . Hyperlipidemia associated with type 2 diabetes mellitus ([place]) 06/06/2013  . Tobacco use disorder 07/21/2007  . Hypertension associated with diabetes ([place]) 07/21/2007  . IDDM (insulin dependent diabetes mellitus) ([place]) 07/21/2007  . Insulin dependent type 2 diabetes mellitus, uncontrolled ([place]) 07/20/1993    Past Surgical History:  Procedure Laterality Date  . VAGINAL HYSTERECTOMY    . 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 Authorizing Provider  aspirin EC 81 MG tablet Take 81 mg by mouth daily.   Yes [provider]  atorvastatin (LIPITOR) 40 MG tablet Take 1 tablet (40 mg total) by mouth daily. 08/13/17  Yes Annia Belt, MD  buPROPion (WELLBUTRIN) 100 MG tablet Take 1 tablet (100 mg total) by mouth 2 (two) times daily. 12/23/17 02/10/18 Yes Chundi, Vahini, MD  calcium-vitamin D (OSCAL WITH D) 500-200 MG-UNIT tablet Take 1 tablet by mouth 2 (two) times daily. 12/23/17  Yes Chundi, Verne Spurr, MD  ferrous sulfate 325 (65 FE) MG EC tablet Take 1 tablet (325 mg total) by mouth daily with breakfast. 11/11/17  Yes Chundi, Vahini, MD  LANTUS 100 UNIT/ML injection INJECT 24 UNITS INTO THE SKIN AT BEDTIME 02/08/18  Yes Chundi, Vahini, MD  lisinopril-hydrochlorothiazide (PRINZIDE,ZESTORETIC) 20-25 MG tablet Take 1 tablet by  mouth daily. 08/13/17  Yes Annia Belt, MD  omeprazole (PRILOSEC) 40 MG capsule TAKE ONE CAPSULE BY MOUTH ONCE DAILY AS NEEDED Patient taking differently: Take 40 mg by mouth daily as needed (acid reflux).  12/23/17  Yes Chundi, Verne Spurr, MD  sitaGLIPtin-metformin (JANUMET) 50-1000 MG tablet Take 1 tablet by mouth 2 (two) times daily with a meal. 11/03/17  Yes Chundi, Vahini, MD  ACCU-CHEK FASTCLIX LANCETS MISC Check blood sugar up to 3 times a day 09/07/16   Dellia Nims, MD  Blood Glucose  Monitoring Suppl (ACCU-CHEK AVIVA PLUS) w/Device KIT Check blood sugar up to 3 times a day 09/07/16   Dellia Nims, MD  cephALEXin (KEFLEX) 500 MG capsule Take 1 capsule (500 mg total) by mouth 4 (four) times daily for 10 days. 02/10/18 02/20/18  Duffy Bruce, MD  glucose blood (ACCU-CHEK AVIVA PLUS) test strip USE 3 TO 4 TIMES DAILY TO CHECK BLOOD SUGAR The patient is insulin requiring, ICD 10 code E11.9. The patient tests 4 times per day. 03/18/17   Rivet, Sindy [place], MD  Insulin Syringe-Needle U-100 31G X 15/64" 0.3 ML MISC Use to inject insulin one time daily 08/13/17   Annia Belt, MD  ondansetron (ZOFRAN ODT) 4 MG disintegrating tablet Take 1 tablet (4 mg total) by mouth every 8 (eight) hours as needed for nausea or vomiting. 02/10/18   Duffy Bruce, MD  potassium chloride SA (K-DUR,KLOR-CON) 20 MEQ tablet Take 2 tablets (40 mEq total) by mouth 2 (two) times daily for 2 days. 02/10/18 02/12/18  Duffy Bruce, MD  promethazine (PHENERGAN) 25 MG suppository Place 1 suppository (25 mg total) rectally every 6 (six) hours as needed for refractory nausea / vomiting. 02/10/18   Duffy Bruce, MD    Family History Family History  Problem Relation Age of Onset  . Diabetes Mother   . Diabetes Father   . Diabetes Sister   . Hypertension Sister   . Diabetes Brother   . Hypertension Brother   . Colon cancer Neg Hx   . Esophageal cancer Neg Hx   . Stomach cancer Neg Hx   . Rectal cancer Neg Hx     Social History Social History   Tobacco Use  . Smoking status: Current Every Day Smoker    Packs/day: 0.01    Types: Cigarettes  . Smokeless tobacco: Never Used  . Tobacco comment: 3-4 cigs per week  Substance Use Topics  . Alcohol use: Yes    Alcohol/week: 0.0 oz    Types: 1 - 2 Glasses of wine per week    Comment: Occasionally  . Drug use: No     Allergies   Metronidazole and Shellfish allergy   Review of Systems Review of Systems  Constitutional: Negative for fever.  HENT:  Negative for sore throat.   Eyes: Negative for visual disturbance.  Respiratory: Negative for cough and shortness of breath.   Cardiovascular: Negative for chest pain.  Gastrointestinal: Positive for abdominal pain, nausea and vomiting. Negative for constipation and diarrhea.  Genitourinary: Positive for dysuria ("tingling" with urination). Negative for difficulty urinating.  Musculoskeletal: Negative for back pain and neck pain.  Skin: Negative for rash.  Neurological: Negative for syncope and headaches.     Physical Exam Updated Vital Signs BP 132/81   Pulse 70   Temp (!) 97.5 F (36.4 C) (Oral)   Resp 16   SpO2 99%   Physical Exam  Constitutional: She is oriented to person, place, and time. She appears well-developed and well-nourished.  She appears distressed (tearful).  HENT:  Head: Normocephalic and atraumatic.  Eyes: Conjunctivae and EOM are normal.  Neck: Normal range of motion.  Cardiovascular: Normal rate, regular rhythm, normal heart sounds and intact distal pulses. Exam reveals no gallop and no friction rub.  No murmur heard. Pulmonary/Chest: Effort normal and breath sounds normal. No respiratory distress. She has no wheezes. She has no rales.  Abdominal: Soft. She exhibits no distension. There is tenderness in the right upper quadrant and right lower quadrant. There is no guarding and no CVA tenderness.  Musculoskeletal: She exhibits no edema or tenderness.  Neurological: She is alert and oriented to person, place, and time.  Skin: Skin is warm and dry. No rash noted. She is not diaphoretic. No erythema.  Nursing note and vitals reviewed.    ED Treatments / Results  Labs (all labs ordered are listed, but only abnormal results are displayed) Labs Reviewed  COMPREHENSIVE METABOLIC PANEL - Abnormal; Notable for the following components:      Result Value   Potassium 3.2 (*)    Glucose, Bld 255 (*)    AST 13 (*)    ALT 8 (*)    All other components within  normal limits  CBC - Abnormal; Notable for the following components:   MCV 76.4 (*)    MCH 25.1 (*)    All other components within normal limits  URINALYSIS, ROUTINE W REFLEX MICROSCOPIC - Abnormal; Notable for the following components:   Glucose, UA >=500 (*)    Hgb urine dipstick MODERATE (*)    Ketones, ur 20 (*)    Protein, ur 100 (*)    Leukocytes, UA TRACE (*)    Bacteria, UA RARE (*)    Squamous Epithelial / LPF 0-5 (*)    All other components within normal limits  LIPASE, BLOOD    EKG  EKG Interpretation None       Radiology Ct Abdomen Pelvis W Contrast  Result Date: 02/10/2018 CLINICAL DATA:  62 year old female with abdominal pain. Right lower quadrant pain. EXAM: CT ABDOMEN AND PELVIS WITH CONTRAST TECHNIQUE: Multidetector CT imaging of the abdomen and pelvis was performed using the standard protocol following bolus administration of intravenous contrast. CONTRAST:  100 cc Isovue 300 COMPARISON:  CT of the abdomen pelvis dated 06/13/2011 FINDINGS: Lower chest: The visualized lung bases are clear. No intra-abdominal free air or free fluid. Hepatobiliary: An area of heterogeneous enhancement in the left lobe of the liver is again noted which is not well characterized but may be related to differential perfusion. A 2.4 x 1.7 cm enhancing area in the left lobe of the liver appears grossly similar to the study of 2012 most consistent with a benign process and may represent a hemangioma or vascular shunting. MRI may provide better characterization if clinically indicated. There is no intrahepatic biliary ductal dilatation. The gallbladder is unremarkable. Pancreas: Unremarkable. No pancreatic ductal dilatation or surrounding inflammatory changes. Spleen: Normal in size without focal abnormality. Adrenals/Urinary Tract: The adrenal glands are unremarkable. There is a 3 mm non-obstructing right renal interpolar stone. No hydronephrosis. Renal vascular calcifications noted. There is  symmetric enhancement and excretion of contrast by both kidneys. The visualized ureters appear unremarkable. The urinary bladder is partially distended. There is apparent diffuse thickening of the bladder wall which may be partly related to underdistention. Cystitis is not excluded. Correlation with urinalysis recommended. Stomach/Bowel: There is sigmoid diverticulosis without active inflammatory changes. There is no bowel obstruction or active inflammation. Normal appendix. Vascular/Lymphatic:  Advanced aortoiliac atherosclerotic disease. A femoral femoral bypass graft is noted. There is poor opacification of the right iliac arteries. No portal venous gas. There is no adenopathy. Reproductive: Hysterectomy.  No pelvic mass. Other: None Musculoskeletal: Degenerate changes of the spine. No acute osseous pathology. IMPRESSION: 1. No acute intra-abdominal or pelvic pathology. Sigmoid diverticulosis. No bowel obstruction or active inflammation. Normal appendix. 2. Small nonobstructing right renal interpolar stone. No hydronephrosis. 3. Advanced Aortic Atherosclerosis (ICD10-I70.0). 4. Stable enhancing lesion in the left lobe of the liver, not characterized, possibly a hemangioma or vascular shunting. Electronically Signed   By: Anner Crete M.D.   On: 02/10/2018 01:53    Procedures Procedures (including critical care time)  Medications Ordered in ED Medications  ondansetron (ZOFRAN-ODT) disintegrating tablet 4 mg (4 mg Oral Given 02/09/18 2204)  sodium chloride 0.9 % bolus 1,000 mL (0 mLs Intravenous Stopped 02/10/18 0035)  morphine 4 MG/ML injection 4 mg (4 mg Intravenous Given 02/09/18 2321)  ondansetron (ZOFRAN) injection 4 mg (4 mg Intravenous Given 02/09/18 2321)  iopamidol (ISOVUE-300) 61 % injection 100 mL (100 mLs Intravenous Contrast Given 02/10/18 0129)  ondansetron (ZOFRAN) injection 4 mg (4 mg Intravenous Given 02/10/18 0131)  cefTRIAXone (ROCEPHIN) 2 g in sodium chloride 0.9 % 100 mL IVPB (0 g  Intravenous Stopped 02/10/18 0304)  sodium chloride 0.9 % bolus 1,000 mL (0 mLs Intravenous Stopped 02/10/18 0403)  gi cocktail (Maalox,Lidocaine,Donnatal) (30 mLs Oral Given 02/10/18 0314)  promethazine (PHENERGAN) tablet 25 mg (25 mg Oral Given 02/10/18 0314)  potassium chloride SA (K-DUR,KLOR-CON) CR tablet 40 mEq (40 mEq Oral Given 02/10/18 0402)     Initial Impression / Assessment and Plan / ED Course  I have reviewed the triage vital signs and the nursing notes.  Pertinent labs & imaging results that were available during my care of the patient were reviewed by me and considered in my medical decision making (see chart for details).     62 year old female with a history of hypertension, DM, hyperlipidemia, femorofemoral bypass, anxiety and depression presents with concern for abdominal pain for 1 week.  Will obtain CT for evaluation for appendicitis, consider cholecystitis. Labs, UA and CT pending at time of transfer of care to Dr. Ellender Hose.  Final Clinical Impressions(s) / ED Diagnoses   Final diagnoses:  Lower urinary tract infection    ED Discharge Orders        Ordered    cephALEXin (KEFLEX) 500 MG capsule  4 times daily     02/10/18 0349    ondansetron (ZOFRAN ODT) 4 MG disintegrating tablet  Every 8 hours PRN     02/10/18 0349    promethazine (PHENERGAN) 25 MG suppository  Every 6 hours PRN     02/10/18 0349    potassium chloride SA (K-DUR,KLOR-CON) 20 MEQ tablet  2 times daily     02/10/18 0349       Gareth Morgan, MD 02/10/18 1700

## 2018-02-13 ENCOUNTER — Emergency Department (HOSPITAL_BASED_OUTPATIENT_CLINIC_OR_DEPARTMENT_OTHER)
Admit: 2018-02-13 | Discharge: 2018-02-13 | Disposition: A | Discharge status: Home / Self Care | Payer: Medicaid Other | Attending: Emergency Medicine | Admitting: Emergency Medicine

## 2018-02-13 ENCOUNTER — Emergency Department (HOSPITAL_COMMUNITY)
Admission: EM | Admit: 2018-02-13 | Discharge: 2018-02-13 | Disposition: A | Discharge status: Home / Self Care | Payer: Medicaid Other | Attending: Emergency Medicine | Admitting: Emergency Medicine

## 2018-02-13 ENCOUNTER — Other Ambulatory Visit: Payer: Self-pay

## 2018-02-13 ENCOUNTER — Encounter (HOSPITAL_COMMUNITY): Payer: Self-pay | Admitting: *Deleted

## 2018-02-13 DIAGNOSIS — F17210 Nicotine dependence, cigarettes, uncomplicated: Secondary | ICD-10-CM | POA: Diagnosis not present

## 2018-02-13 DIAGNOSIS — I10 Essential (primary) hypertension: Secondary | ICD-10-CM | POA: Insufficient documentation

## 2018-02-13 DIAGNOSIS — Z794 Long term (current) use of insulin: Secondary | ICD-10-CM | POA: Insufficient documentation

## 2018-02-13 DIAGNOSIS — R1030 Lower abdominal pain, unspecified: Secondary | ICD-10-CM | POA: Insufficient documentation

## 2018-02-13 DIAGNOSIS — E119 Type 2 diabetes mellitus without complications: Secondary | ICD-10-CM | POA: Insufficient documentation

## 2018-02-13 DIAGNOSIS — R300 Dysuria: Secondary | ICD-10-CM | POA: Diagnosis not present

## 2018-02-13 LAB — CBC WITH DIFFERENTIAL/PLATELET
BASOS ABS: 0 10*3/uL (ref 0.0–0.1)
Basophils Relative: 0 %
EOS ABS: 0 10*3/uL (ref 0.0–0.7)
EOS PCT: 1 %
HCT: 39.5 % (ref 36.0–46.0)
Hemoglobin: 12.8 g/dL (ref 12.0–15.0)
LYMPHS ABS: 2 10*3/uL (ref 0.7–4.0)
LYMPHS PCT: 24 %
MCH: 25.4 pg — AB (ref 26.0–34.0)
MCHC: 32.4 g/dL (ref 30.0–36.0)
MCV: 78.4 fL (ref 78.0–100.0)
MONO ABS: 0.8 10*3/uL (ref 0.1–1.0)
Monocytes Relative: 9 %
Neutro Abs: 5.7 10*3/uL (ref 1.7–7.7)
Neutrophils Relative %: 66 %
PLATELETS: 384 10*3/uL (ref 150–400)
RBC: 5.04 MIL/uL (ref 3.87–5.11)
RDW: 15.4 % (ref 11.5–15.5)
WBC: 8.6 10*3/uL (ref 4.0–10.5)

## 2018-02-13 LAB — COMPREHENSIVE METABOLIC PANEL
ALK PHOS: 68 U/L (ref 38–126)
ALT: 10 U/L — AB (ref 14–54)
AST: 16 U/L (ref 15–41)
Albumin: 3.8 g/dL (ref 3.5–5.0)
Anion gap: 11 (ref 5–15)
BUN: 11 mg/dL (ref 6–20)
CALCIUM: 9.4 mg/dL (ref 8.9–10.3)
CHLORIDE: 101 mmol/L (ref 101–111)
CO2: 28 mmol/L (ref 22–32)
CREATININE: 0.78 mg/dL (ref 0.44–1.00)
GFR calc Af Amer: >60 mL/min (ref >60)
GFR calc non Af Amer: >60 mL/min (ref >60)
Glucose, Bld: 100 mg/dL — ABNORMAL HIGH (ref 65–99)
Potassium: 3.8 mmol/L (ref 3.5–5.1)
Sodium: 140 mmol/L (ref 135–145)
Total Bilirubin: 0.4 mg/dL (ref 0.3–1.2)
Total Protein: 7.4 g/dL (ref 6.5–8.1)

## 2018-02-13 LAB — URINALYSIS, ROUTINE W REFLEX MICROSCOPIC
Bilirubin Urine: NEGATIVE
Glucose, UA: NEGATIVE mg/dL
HGB URINE DIPSTICK: NEGATIVE
KETONES UR: 5 mg/dL — AB
Leukocytes, UA: NEGATIVE
Nitrite: NEGATIVE
PROTEIN: NEGATIVE mg/dL
Specific Gravity, Urine: 1.017 (ref 1.005–1.030)
pH: 8 (ref 5.0–8.0)

## 2018-02-13 LAB — LIPASE, BLOOD: LIPASE: 22 U/L (ref 11–51)

## 2018-02-13 MED ORDER — METHOCARBAMOL 500 MG PO TABS: 500.0000 mg | ORAL_TABLET | Freq: Two times a day (BID) | ORAL | 0 refills | Status: DC

## 2018-02-13 MED ORDER — MORPHINE SULFATE (PF) 4 MG/ML IV SOLN
4.0000 mg | Freq: Once | INTRAVENOUS | Status: AC
  Administered 2018-02-13: 4 mg via INTRAVENOUS
  Filled 2018-02-13: qty 1

## 2018-02-13 MED ORDER — SODIUM CHLORIDE 0.9 % IV BOLUS (SEPSIS)
1000.0000 mL | Freq: Once | INTRAVENOUS | Status: AC
  Administered 2018-02-13: 1000 mL via INTRAVENOUS

## 2018-02-13 MED ORDER — ONDANSETRON HCL 4 MG/2ML IJ SOLN
4.0000 mg | Freq: Once | INTRAMUSCULAR | Status: AC
  Administered 2018-02-13: 4 mg via INTRAVENOUS
  Filled 2018-02-13: qty 2

## 2018-02-13 NOTE — ED Provider Notes (Signed)
Emergency Department Provider Note   I have reviewed the triage vital signs and the nursing notes.   HISTORY  Chief Complaint Abdominal Pain   HPI Angie Cain is a 62 y.o. female with PMH of DM, GERD, HLD, HTN, and PAD s/p fem-fem bypass in 2011 presents to the emergency department for evaluation of pain in the lower abdomen.  She has slight radiation of symptoms down the right leg.  Department recently for these symptoms and diagnosed with a urinary tract infection.  She was ultimately given Rocephin in the ED and discharged home with Keflex which she has been taking routinely.  She denies fevers but has intermittent spasm type pains in the midline lower abdomen.  She continues to have some mild dysuria.  Denies hematuria.  No vaginal bleeding or discharge.  She has some discomfort down the inside of the right leg.  No worsening pain with walking or exertion.  No rash, bruising, or other abnormality of the skin on the right leg.    Past Medical History:  Diagnosis Date  . Allergy   . Anxiety    occasional  . Arthritis   . Atherosclerotic peripheral vascular disease with intermittent claudication ([place])    s/p fem-fem bypass 2011. ABI 07/2012 0.5 R 0.65 L  . Diabetes mellitus without complication ([place])   . GERD (gastroesophageal reflux disease)   . Hyperlipidemia   . Hypertension   . Personal history of colonic polyps - adenomas 03/07/2014  . Tobacco abuse     Patient Active Problem List   Diagnosis Date Noted  . Allergic rhinitis 10/16/2017  . Major depression 10/16/2017  . Iron deficiency anemia 05/08/2017  . Healthcare maintenance 03/18/2017  . Eye discharge 07/31/2016  . Rotator cuff impingement syndrome of left shoulder 03/23/2016  . Osteopenia determined by x-ray 01/25/2015  . Bilateral plantar fasciitis 09/30/2014  . Hx of adenomatous colonic polyps 03/07/2014  . Low back pain 12/18/2013  . GERD (gastroesophageal reflux disease) 06/06/2013  . Hyperlipidemia  associated with type 2 diabetes mellitus ([place]) 06/06/2013  . Tobacco use disorder 07/21/2007  . Hypertension associated with diabetes ([place]) 07/21/2007  . IDDM (insulin dependent diabetes mellitus) ([place]) 07/21/2007  . Insulin dependent type 2 diabetes mellitus, uncontrolled ([place]) 07/20/1993    Past Surgical History:  Procedure Laterality Date  . VAGINAL HYSTERECTOMY    . 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 Order #: [id number] Class: Normal  . Order #: [id number] Class: Historical Med  . Order #: [id number] Class: Normal  . Order #: [id number] Class: Normal  . Order #: [id number] Class: Normal  . Order #: [id number] Class: Print  . Order #: [id number] Class: Normal  . Order #: [id number] Class: Normal  . Order #: [id number] Class: Normal  . Order #: [id number] Class: Normal  . Order #: [id number] Class: Normal  . Order #: [id number] Class: Normal  . Order #: [id number] Class: Print  . Order #: [id number] Class: Print  . Order #: [id number] Class: Print  . Order #: [id number] Class: Normal  . Order #: [id number] Class: Normal  . Order #: [id number] Class: Print    Allergies Metronidazole and Shellfish allergy  Family History  Problem Relation Age of Onset  . Diabetes Mother   . Diabetes Father   . Diabetes Sister   . Hypertension Sister   . Diabetes Brother   . Hypertension Brother   . Colon cancer Neg Hx   . Esophageal cancer Neg Hx   . Stomach cancer Neg Hx   .  Rectal cancer Neg Hx     Social History Social History   Tobacco Use  . Smoking status: Current Every Day Smoker    Packs/day: 0.01    Types: Cigarettes  . Smokeless tobacco: Never Used  . Tobacco comment: 3-4 cigs per week  Substance Use Topics  . Alcohol use: Yes    Alcohol/week: 0.0 oz    Types: 1 - 2 Glasses of wine per week    Comment: Occasionally  . Drug use: No    Review of Systems  Constitutional: No fever/chills Eyes: No visual changes. ENT: No sore  throat. Cardiovascular: Denies chest pain. Respiratory: Denies shortness of breath. Gastrointestinal: Positive lower abdominal pain.  No nausea, no vomiting.  No diarrhea.  No constipation. Genitourinary: Negative for dysuria. Musculoskeletal: Negative for back pain. Positive right leg pain.  Skin: Negative for rash. Neurological: Negative for headaches, focal weakness or numbness.  10-point ROS otherwise negative.  ____________________________________________   PHYSICAL EXAM:  VITAL SIGNS: ED Triage Vitals [02/13/18 1036]  Enc Vitals Group     BP (!) 142/79     Pulse Rate (!) 57     Resp 18     Temp 97.6 F (36.4 C)     Temp Source Oral     SpO2 97 %   Constitutional: Alert and oriented. Appears uncomfortable.  Eyes: Conjunctivae are normal. Head: Atraumatic. Nose: No congestion/rhinnorhea. Mouth/Throat: Mucous membranes are moist.   Neck: No stridor.   Cardiovascular: Normal rate, regular rhythm. Good peripheral circulation. Grossly normal heart sounds.  Palpable DP/PT pulses in the RLE. Normal color and warmth.  Respiratory: Normal respiratory effort.  No retractions. Lungs CTAB. Gastrointestinal: Soft with diffuse tenderness, worse in the lower abdomen. No rebound or guarding. No distention.  Musculoskeletal: No lower extremity tenderness nor edema. No gross deformities of extremities. Neurologic:  Normal speech and language. No gross focal neurologic deficits are appreciated.  Skin:  Skin is warm, dry and intact. No rash noted.  ____________________________________________   LABS (all labs ordered are listed, but only abnormal results are displayed)  Labs Reviewed  COMPREHENSIVE METABOLIC PANEL - Abnormal; Notable for the following components:      Result Value   Glucose, Bld 100 (*)    ALT 10 (*)    All other components within normal limits  CBC WITH DIFFERENTIAL/PLATELET - Abnormal; Notable for the following components:   MCH 25.4 (*)    All other  components within normal limits  URINALYSIS, ROUTINE W REFLEX MICROSCOPIC - Abnormal; Notable for the following components:   Ketones, ur 5 (*)    All other components within normal limits  URINE CULTURE  LIPASE, BLOOD   ____________________________________________  RADIOLOGY  ABI right leg:   Preliminary results by tech - ABI Completed. Stable ABIs when compared to prior study dated 08/11/12. Monophasic waveforms bilaterally with the right sided ABI  0.57 and left sided ABI 0.58. Oda Cogan, BS, RDMS, RVT   ____________________________________________   PROCEDURES  Procedure(s) performed:   Procedures  None ____________________________________________   INITIAL IMPRESSION / ASSESSMENT AND PLAN / ED COURSE  Pertinent labs & imaging results that were available during my care of the patient were reviewed by me and considered in my medical decision making (see chart for details).  Patient presents to the emergency department with continued lower abdominal pain.  CT scan performed during her last ED visit was unremarkable.  She did have evidence of UTI during that encounter.  There is no culture available for  me to review.  She has been compliant with Keflex by report.  No fever, flank pain, or other symptoms to suspect developing pyelonephritis.  Does have history of peripheral artery disease along with femorofemoral bypass done in 2011.  Plan for arterial duplex of the right lower extremity for further evaluation of possible graft related pain but have low suspicion for this.  ABI in the right leg reviewed with no acute findings. Labs reviewed with no acute findings. Suspect bladder spasm as cause of pain. No indication for repeat CT abdomen/pelvis with study done within the last 5 days with the same pain. Will f/u with PCP.   At this time, I do not feel there is any life-threatening condition present. I have reviewed and discussed all results (EKG, imaging, lab, urine as  appropriate), exam findings with patient. I have reviewed nursing notes and appropriate previous records.  I feel the patient is safe to be discharged home without further emergent workup. Discussed usual and customary return precautions. Patient and family (if present) verbalize understanding and are comfortable with this plan.  Patient will follow-up with their primary care provider. If they do not have a primary care provider, information for follow-up has been provided to them. All questions have been answered.  ____________________________________________  FINAL CLINICAL IMPRESSION(S) / ED DIAGNOSES  Final diagnoses:  Lower abdominal pain     MEDICATIONS GIVEN DURING THIS VISIT:  Medications  sodium chloride 0.9 % bolus 1,000 mL (0 mLs Intravenous Stopped 02/13/18 1551)  morphine 4 MG/ML injection 4 mg (4 mg Intravenous Given 02/13/18 1056)  ondansetron (ZOFRAN) injection 4 mg (4 mg Intravenous Given 02/13/18 1056)  morphine 4 MG/ML injection 4 mg (4 mg Intravenous Given 02/13/18 1305)     NEW OUTPATIENT MEDICATIONS STARTED DURING THIS VISIT:  Discharge Medication List as of 02/13/2018  3:37 PM    START taking these medications   Details  methocarbamol (ROBAXIN) 500 MG tablet Take 1 tablet (500 mg total) by mouth 2 (two) times daily., Starting Sun 02/13/2018, Print        Note:  This document was prepared using Dragon voice recognition software and may include unintentional dictation errors.  Nanda Quinton, MD Emergency Medicine    Demetrice Combes, [facility], MD 02/13/18 2013

## 2018-02-13 NOTE — Progress Notes (Signed)
Preliminary results by tech - ABI Completed. Stable ABIs when compared to prior study dated 08/11/12. Monophasic waveforms bilaterally with the right sided ABI  0.57 and left sided ABI 0.58. Oda Cogan, BS, RDMS, RVT

## 2018-02-13 NOTE — Discharge Instructions (Signed)

## 2018-02-13 NOTE — ED Notes (Signed)
Bed: WA05 Expected date:  Expected time:  Means of arrival:  Comments: 

## 2018-02-13 NOTE — ED Notes (Signed)
Pt cannot use restroom at this time, aware urine specimen is needed.  

## 2018-02-13 NOTE — ED Triage Notes (Signed)
Pt complains of abdominal pain. Pt was prescribed keflex for possible UTI. Pt states she started feeling worse this morning.

## 2018-02-15 LAB — URINE CULTURE
Culture: NO GROWTH
SPECIAL REQUESTS: NORMAL

## 2018-02-17 NOTE — Progress Notes (Signed)
CC: Abdominal pain  HPI:  Angie Cain is a 62 y.o. female with diabetes mellitus, essential hypertension, hyperlipidemia, pvd s/p femoral bypass in 2011, and anxiety who presents for emergency room follow up for lower abdominal pain.   Past Medical History:  Diagnosis Date  . Allergy   . Anxiety    occasional  . Arthritis   . Atherosclerotic peripheral vascular disease with intermittent claudication ([place])    s/p fem-fem bypass 2011. ABI 07/2012 0.5 R 0.65 L  . Diabetes mellitus without complication ([place])   . GERD (gastroesophageal reflux disease)   . Hyperlipidemia   . Hypertension   . Personal history of colonic polyps - adenomas 03/07/2014  . Tobacco abuse    Review of Systems:    Nausea, abdominal pain, constipation, right shoulder pain   Physical Exam:  Vitals:   02/18/18 1404  BP: 119/67  Pulse: 78  Temp: 97.9 F (36.6 C)  TempSrc: Oral  SpO2: 100%  Weight: 146 lb 8 oz (66.5 kg)  Height: 5\' 5"  (1.651 m)   Physical Exam  Constitutional: She appears well-developed and well-nourished. No distress.  HENT:  Head: Normocephalic and atraumatic.  Eyes: Conjunctivae are normal.  Cardiovascular: Normal rate, regular rhythm and normal heart sounds.  Respiratory: Effort normal and breath sounds normal. No respiratory distress. She has no wheezes.  GI: Soft. Bowel sounds are normal. There is tenderness (mild generalized ).  Neurological: She is alert.  Skin: She is not diaphoretic.  Psychiatric: Her behavior is normal. Judgment and thought content normal. Her mood appears anxious.     Assessment & Plan:   See Encounters Tab for problem based charting.  Abdominal pain The patient presents with continued lower abdmoinal pain. The patient was seen at [facility] on 02/09/18. CT abdomen found small nonobstructing right renal interpolar stone without hydronephrosis. She was also thought to have early mild pyelonephritis and was treated with rocephin and fluids.  She was discharged with keflex 500mg  4 times daily for 10 day duration. The patient mentions that she stopped using Keflex 2-3 days after ED visit as it was causing nausea and her "to feel sick". She was seen again on 02/13/18 and told to continue antibiotics and follow up with pcp during that visit.   The patient states that she has been having lower abdominal pain that is worse on her left side. She describes the pain currently as 5/10 intensity, better from previous, achy in nature. She states that the pain was initially accompanied with lower back pain, dysuria, and blood in her urine. She states that she used azo initially which somewhat alleviated the pain.   CT abdomen showed a nonobstructing kidney stone which should not cause abdominal pain or any other clinical signs/symptoms. The patient states that she previously used to have a bowel movement daily, but for the past 1-2 weeks she has only been having one every 3 days. The patient's abdomen is slightly distended.   Assessment The patient's pain overall seems abdominal pain nos and seems to be getting better. She likely had a  mild pyelonephritis/cystitis that she is still recovering from. She can stop antibiotics at this time. She has some concomitant constipation which maybe making abdominal pain worse.   Plan Instructed patient to drink plenty of fluids, use over the counter laxatives for constipation as needed, and to get plenty of rest. Told the patient to follow up if the abdominal pain continues for another 2 weeks.    Essential Hypertension The  patient's blood pressure during this visit was 119/67. The patient is currently on prinzide 20-25mg  qd.   Plan Continue current blood pressure regimen as the patient's blood pressure is well controlled.    Diabetes Mellitus The patient's last A1c was 9.1 on 10/15/17. She is currently taking janumet 50-1000mg  bid and lantus 24 units qhs. The patient's home blood glucose measurements over the  past month have ranged 80-320. She is not compliant with her medication. Patient has lost 9 lbs over the past month. The patient's A1c during this visit was 9.9.   The patient states that she has not been taking her diabetes medication over the past 2 weeks as she has been feeling sick.   Plan -Recommended that the patient continue Janumet 50-1000mg  bid and lantus 24 units qhs.   Health Maintenance Does not want flu shot  Patient has appointment to see ophthalmology exam  Patient seen with Dr. Evette Doffing

## 2018-02-18 ENCOUNTER — Encounter: Payer: Self-pay | Admitting: Internal Medicine

## 2018-02-18 ENCOUNTER — Other Ambulatory Visit: Payer: Self-pay

## 2018-02-18 ENCOUNTER — Ambulatory Visit: Payer: Medicaid Other | Admitting: Internal Medicine

## 2018-02-18 VITALS — BP 119/67 | HR 78 | Temp 97.9°F | Ht 65.0 in | Wt 146.5 lb

## 2018-02-18 DIAGNOSIS — R110 Nausea: Secondary | ICD-10-CM | POA: Diagnosis not present

## 2018-02-18 DIAGNOSIS — M545 Low back pain: Secondary | ICD-10-CM | POA: Diagnosis not present

## 2018-02-18 DIAGNOSIS — E1151 Type 2 diabetes mellitus with diabetic peripheral angiopathy without gangrene: Secondary | ICD-10-CM | POA: Diagnosis not present

## 2018-02-18 DIAGNOSIS — I10 Essential (primary) hypertension: Secondary | ICD-10-CM

## 2018-02-18 DIAGNOSIS — N200 Calculus of kidney: Secondary | ICD-10-CM | POA: Diagnosis not present

## 2018-02-18 DIAGNOSIS — E1159 Type 2 diabetes mellitus with other circulatory complications: Secondary | ICD-10-CM | POA: Diagnosis not present

## 2018-02-18 DIAGNOSIS — Z79899 Other long term (current) drug therapy: Secondary | ICD-10-CM

## 2018-02-18 DIAGNOSIS — R319 Hematuria, unspecified: Secondary | ICD-10-CM | POA: Diagnosis not present

## 2018-02-18 DIAGNOSIS — R1032 Left lower quadrant pain: Secondary | ICD-10-CM | POA: Diagnosis not present

## 2018-02-18 DIAGNOSIS — I70309 Unspecified atherosclerosis of unspecified type of bypass graft(s) of the extremities, unspecified extremity: Secondary | ICD-10-CM | POA: Diagnosis not present

## 2018-02-18 DIAGNOSIS — E785 Hyperlipidemia, unspecified: Secondary | ICD-10-CM

## 2018-02-18 DIAGNOSIS — Z794 Long term (current) use of insulin: Secondary | ICD-10-CM

## 2018-02-18 DIAGNOSIS — F419 Anxiety disorder, unspecified: Secondary | ICD-10-CM | POA: Diagnosis not present

## 2018-02-18 DIAGNOSIS — M25511 Pain in right shoulder: Secondary | ICD-10-CM

## 2018-02-18 DIAGNOSIS — R300 Dysuria: Secondary | ICD-10-CM

## 2018-02-18 DIAGNOSIS — K5900 Constipation, unspecified: Secondary | ICD-10-CM

## 2018-02-18 DIAGNOSIS — Z9114 Patient's other noncompliance with medication regimen: Secondary | ICD-10-CM

## 2018-02-18 DIAGNOSIS — R140 Abdominal distension (gaseous): Secondary | ICD-10-CM

## 2018-02-18 DIAGNOSIS — Z0000 Encounter for general adult medical examination without abnormal findings: Secondary | ICD-10-CM

## 2018-02-18 DIAGNOSIS — IMO0001 Reserved for inherently not codable concepts without codable children: Secondary | ICD-10-CM

## 2018-02-18 DIAGNOSIS — E1165 Type 2 diabetes mellitus with hyperglycemia: Secondary | ICD-10-CM

## 2018-02-18 DIAGNOSIS — E119 Type 2 diabetes mellitus without complications: Secondary | ICD-10-CM

## 2018-02-18 DIAGNOSIS — IMO0002 Reserved for concepts with insufficient information to code with codable children: Secondary | ICD-10-CM

## 2018-02-18 LAB — GLUCOSE, CAPILLARY: Glucose-Capillary: 287 mg/dL — ABNORMAL HIGH (ref 65–99)

## 2018-02-18 LAB — POCT GLYCOSYLATED HEMOGLOBIN (HGB A1C): HEMOGLOBIN A1C: 9.9

## 2018-02-18 NOTE — Patient Instructions (Signed)
It was a pleasure to see you today Ms. Rahilly. Please make the following changes:  -Please continue to take janumet 50-1000mg  twice daily and lantus 24 units nightly -Please continue to monitor your abdominal pain. I suspect that it will resolve on its own in a few days.  If you have any questions or concerns, please call our clinic at [phone] between 9am-5pm and after hours call [phone] and ask for the internal medicine resident on call. If you feel you are having a medical emergency please call 911.   Thank you, we look forward to help you remain healthy!  Lars Mage, MD Internal Medicine PGY1

## 2018-02-19 DIAGNOSIS — R109 Unspecified abdominal pain: Secondary | ICD-10-CM | POA: Insufficient documentation

## 2018-02-19 NOTE — Assessment & Plan Note (Signed)
The patient's blood pressure during this visit was 119/67. The patient is currently on prinzide 20-25mg  qd.   Plan Continue current blood pressure regimen as the patient's blood pressure is well controlled.

## 2018-02-19 NOTE — Assessment & Plan Note (Signed)
Does not want flu shot  Patient has appointment to see ophthalmology exam

## 2018-02-19 NOTE — Assessment & Plan Note (Signed)
The patient presents with continued lower abdmoinal pain. The patient was seen at [facility] on 02/09/18. CT abdomen found small nonobstructing right renal interpolar stone without hydronephrosis. She was also thought to have early mild pyelonephritis and was treated with rocephin and fluids. She was discharged with keflex 500mg  4 times daily for 10 day duration. The patient mentions that she stopped using Keflex 2-3 days after ED visit as it was causing nausea and her "to feel sick". She was seen again on 02/13/18 and told to continue antibiotics and follow up with pcp during that visit.   The patient states that she has been having lower abdominal pain that is worse on her left side. She describes the pain currently as 5/10 intensity, better from previous, achy in nature. She states that the pain was initially accompanied with lower back pain, dysuria, and blood in her urine. She states that she used azo initially which somewhat alleviated the pain.   CT abdomen showed a nonobstructing kidney stone which should not cause abdominal pain or any other clinical signs/symptoms. The patient states that she previously used to have a bowel movement daily, but for the past 1-2 weeks she has only been having one every 3 days. The patient's abdomen is slightly distended.   Assessment The patient's pain overall seems abdominal pain nos and seems to be getting better. She likely had a  mild pyelonephritis/cystitis that she is still recovering from. She can stop antibiotics at this time. She has some concomitant constipation which maybe making abdominal pain worse.   Plan Instructed patient to drink plenty of fluids, use over the counter laxatives for constipation as needed, and to get plenty of rest. Told the patient to follow up if the abdominal pain continues for another 2 weeks.

## 2018-02-19 NOTE — Assessment & Plan Note (Signed)
The patient's last A1c was 9.1 on 10/15/17. She is currently taking janumet 50-1000mg  bid and lantus 24 units qhs. The patient's home blood glucose measurements over the past month have ranged 80-320. She is not compliant with her medication. Patient has lost 9 lbs over the past month. The patient's A1c during this visit was 9.9.   The patient states that she has not been taking her diabetes medication over the past 2 weeks as she has been feeling sick.   Plan -Recommended that the patient continue Janumet 50-1000mg  bid and lantus 24 units qhs.

## 2018-02-21 ENCOUNTER — Other Ambulatory Visit: Payer: Self-pay

## 2018-02-21 ENCOUNTER — Emergency Department (HOSPITAL_COMMUNITY)
Admission: EM | Admit: 2018-02-21 | Discharge: 2018-02-21 | Disposition: A | Discharge status: Home / Self Care | Payer: Medicaid Other | Attending: Emergency Medicine | Admitting: Emergency Medicine

## 2018-02-21 ENCOUNTER — Encounter (HOSPITAL_COMMUNITY): Payer: Self-pay

## 2018-02-21 ENCOUNTER — Encounter: Payer: Self-pay | Admitting: Internal Medicine

## 2018-02-21 ENCOUNTER — Ambulatory Visit: Payer: Medicaid Other | Admitting: Internal Medicine

## 2018-02-21 ENCOUNTER — Ambulatory Visit (HOSPITAL_COMMUNITY)
Admit: 2018-02-21 | Discharge: 2018-02-21 | Disposition: A | Discharge status: Home / Self Care | Payer: Medicaid Other | Attending: Internal Medicine | Admitting: Internal Medicine

## 2018-02-21 VITALS — BP 119/75 | HR 73 | Temp 97.6°F | Ht 65.0 in | Wt 141.5 lb

## 2018-02-21 DIAGNOSIS — N179 Acute kidney failure, unspecified: Secondary | ICD-10-CM

## 2018-02-21 DIAGNOSIS — Z5321 Procedure and treatment not carried out due to patient leaving prior to being seen by health care provider: Secondary | ICD-10-CM | POA: Insufficient documentation

## 2018-02-21 DIAGNOSIS — N200 Calculus of kidney: Secondary | ICD-10-CM

## 2018-02-21 DIAGNOSIS — R1084 Generalized abdominal pain: Secondary | ICD-10-CM

## 2018-02-21 DIAGNOSIS — R109 Unspecified abdominal pain: Secondary | ICD-10-CM | POA: Diagnosis not present

## 2018-02-21 LAB — COMPREHENSIVE METABOLIC PANEL
ALT: 10 U/L — AB (ref 14–54)
AST: 12 U/L — ABNORMAL LOW (ref 15–41)
Albumin: 3.7 g/dL (ref 3.5–5.0)
Alkaline Phosphatase: 65 U/L (ref 38–126)
Anion gap: 12 (ref 5–15)
BUN: 31 mg/dL — ABNORMAL HIGH (ref 6–20)
CHLORIDE: 98 mmol/L — AB (ref 101–111)
CO2: 27 mmol/L (ref 22–32)
CREATININE: 2.07 mg/dL — AB (ref 0.44–1.00)
Calcium: 9.3 mg/dL (ref 8.9–10.3)
GFR, EST AFRICAN AMERICAN: 28 mL/min — AB (ref >60)
GFR, EST NON AFRICAN AMERICAN: 24 mL/min — AB (ref >60)
Glucose, Bld: 264 mg/dL — ABNORMAL HIGH (ref 65–99)
POTASSIUM: 3.4 mmol/L — AB (ref 3.5–5.1)
Sodium: 137 mmol/L (ref 135–145)
Total Bilirubin: 0.5 mg/dL (ref 0.3–1.2)
Total Protein: 7.3 g/dL (ref 6.5–8.1)

## 2018-02-21 LAB — CBC
HCT: 41.9 % (ref 36.0–46.0)
Hemoglobin: 13.3 g/dL (ref 12.0–15.0)
MCH: 24.8 pg — ABNORMAL LOW (ref 26.0–34.0)
MCHC: 31.7 g/dL (ref 30.0–36.0)
MCV: 78 fL (ref 78.0–100.0)
PLATELETS: 354 10*3/uL (ref 150–400)
RBC: 5.37 MIL/uL — AB (ref 3.87–5.11)
RDW: 15.3 % (ref 11.5–15.5)
WBC: 8.1 10*3/uL (ref 4.0–10.5)

## 2018-02-21 LAB — LIPASE, BLOOD: LIPASE: 24 U/L (ref 11–51)

## 2018-02-21 NOTE — Progress Notes (Signed)
   CC: Abdominal Pain  HPI:  [name] is a 62 y.o. F with PMHx listed below presenting for Abdominal Pain. Please see the A&P for the status of the patient's chronic medical problems.   Past Medical History:  Diagnosis Date  . Allergy   . Anxiety    occasional  . Arthritis   . Atherosclerotic peripheral vascular disease with intermittent claudication ([place])    s/p fem-fem bypass 2011. ABI 07/2012 0.5 R 0.65 L  . Diabetes mellitus without complication ([place])   . GERD (gastroesophageal reflux disease)   . Hyperlipidemia   . Hypertension   . Personal history of colonic polyps - adenomas 03/07/2014  . Tobacco abuse    Review of Systems: Performed and all others negative.  Physical Exam:  Vitals:   02/21/18 1323 02/21/18 1347  BP:  119/75  Pulse: 76 73  Temp:  97.6 F (36.4 C)  SpO2: 100%   Weight: 141 lb 8 oz (64.2 kg)   Height: 5\' 5"  (1.651 m)    Physical Exam  Constitutional: She appears well-developed and well-nourished. She appears distressed.  Cardiovascular: Normal rate, regular rhythm, normal heart sounds and intact distal pulses.  Pulmonary/Chest: Effort normal and breath sounds normal. No respiratory distress.  Abdominal: Bowel sounds are normal. She exhibits no distension.  Normoactive bowl sounds Tenderness to palpation all quadrants Non tender to pressure with stethoscope during auscultation  Musculoskeletal: She exhibits no edema or deformity.  Skin: Skin is warm and dry.   Assessment & Plan:   See Encounters Tab for problem based charting.  Patient discussed with Dr. Eppie Gibson

## 2018-02-21 NOTE — Patient Instructions (Addendum)
Thank you for allowing [place] to care for you  For your abdominal pain and kidney injury - Labs from the ED showed signs of kidney injury - We are getting urine studies to evaluate for signs of a kidney stone or other kidney injury - We are getting an ultrasound of your Right kidney to access for obstructing kidney stone - We will contact you with results from these studies - Please drink plenty of fluids and try a bland diet  Please return for follow in about 2 days to reassess kidney function if not instructed to return earlier.

## 2018-02-21 NOTE — Assessment & Plan Note (Addendum)
Patient went to the ED this morning and left without being seen. Her initial labwork showed an AKI with Cr 2.07, which is up from 0.78 on labs 8 days ago. She states she has been able to drink fluids despite her nausea with her ongoing abdominal pain. Orthostatic Vitals are mildly positive with BP drop of 20 systolic from laying to sitting and BP drop of 11 diastolic and HR increase of 16 from sitting to standing (not at 3 minutes). Patient maybe be simply volume down with recent nausea and suspected decreased oral intake or have a post renal process due to obstructive stone or acute interstitial nephritis from cephalosporin use. - STAT renal ultrasound to evaluate for obstructive uropathy - Urinalysis to look for casts - Encourage increased hydration - Hold lisinopril-HCTZ for 2 days - Follow up in 2 days to reassess renal function  ADDENDUM: - Renal Ultrasound showed 68mm nonobstructive renal stone, no hydronephrosis or masses. Bladder was decompressed post void.  - U/A showed trace leukocytes, 1+ Protein, Trace Glucose, 1+ Ketones and Micro showed yeast and hyaline casts - Findings dot not indicated obstructive or intrarenal cause of AKI (such as AIN) will evaluate for resolution on follow up.  ADDENDUM - Patient contact by phone and states her pain has largely resolved. Now only have ing some pain in the morning, which is relieved by morning bowl movements. She was instructed to return to clinic for repeat labs.

## 2018-02-21 NOTE — Progress Notes (Signed)
Internal Medicine Clinic Attending  I saw and evaluated the patient.  I personally confirmed the key portions of the history and exam documented by Dr. Chundi and I reviewed pertinent patient test results.  The assessment, diagnosis, and plan were formulated together and I agree with the documentation in the resident's note. 

## 2018-02-21 NOTE — Assessment & Plan Note (Addendum)
Patient has continued to experience diffuse migratory abdominal pain. Pain has been persistent since 02/10/18 as noted during her visit on 02/18/18. Her initial workup included a CT showing bladder wall thickening and non-obstructive 51mm R renal stone and U/A suspicious for UTI. She was treated with Rocephin and keflex. He pain has persisted since that time and worsens in waves. She was seen on Friday during her PCP visit where her pain was improved to 5/10 and she was advised to drink fluids, take laxatives for constipation, and rest. She states that since that time she was doing well until later last night, when the waves of pain returned. Her pain gets as bad as 9/10 comes in waves lasting minutes to half an hour. She has some nausea with the pain but no vomiting. She is unable to localize the pain stating that at times it feel like it is in different locations. She states that she has had relief with the warmth from a warm bath and minimal relief when she took one of her significant others tramadol. She has been able to have bowl movements, which have been normal for her. She denies fever, but endorses some chills. She is afebrile and her lab work from the ED this morning (she left without being seen) showed no leukocytosis and a normal lipase.  With her waves of pain that have persisted as well as her non obstructing renal stone on CT and new AKI on ED labs (see separate note) concern is that the stone has become obstructing or at least intermittently obstructing. Will obtain Renal Ultrasound to evaluate for obstructions and urinalysis to evaluate for blood. - Renal Ultrasound - Urinalysis  ADDENDUM - Renal Ultrasound showed 39mm nonobstructive renal stone, no hydronephrosis or masses. Bladder was decompressed post void.  - U/A showed trace leukocytes, 1+ Protein, Trace Glucose, 1+ Ketones and Micro showed yeast and hyaline casts - Findings dot not indicated obstructive or intrarenal cause of AKI (such as  AIN) or specific cause for abdominal pain. Patient did not describe symptoms of yeast infection at presentation. Will evaluate for resolution on follow up and consider possible fungal UTI.  ADDENDUM - Patient contact by phone and states her pain has largely resolved. Now only have ing some pain in the morning, which is relieved by morning bowl movements. She was instructed to return to clinic for repeat labs.

## 2018-02-21 NOTE — ED Notes (Signed)
Called no answer x2 

## 2018-02-21 NOTE — ED Triage Notes (Signed)
Pt reports lower abdominal pain radiating to the center and left side of her abdomen. Reports vomiting as well. BP 94/67 in triage. Has been seen at [facility] and her PCP for same problem, ongoing X3 weeks.

## 2018-02-22 LAB — MICROSCOPIC EXAMINATION

## 2018-02-22 LAB — URINALYSIS, COMPLETE
Bilirubin, UA: NEGATIVE
NITRITE UA: NEGATIVE
PH UA: 5 (ref 5.0–7.5)
RBC, UA: NEGATIVE
Specific Gravity, UA: 1.028 (ref 1.005–1.030)
Urobilinogen, Ur: 1 mg/dL (ref 0.2–1.0)

## 2018-02-24 ENCOUNTER — Telehealth: Payer: Self-pay | Admitting: Internal Medicine

## 2018-02-24 NOTE — Telephone Encounter (Signed)
Spoke with patient to informed her of the results of her recent studies. She was informed that the was no signs of obstruction in her kidneys on ultrasound. At that while her urinalysis was not normal, there was no indication problems in the kidney to cause her acute kidney injury. UTI also not suspecterd given her lack of symptoms.  She states that her pain has largely resolved.  She still has some pain in the morning, which she attributes to constipation as it is relieved with a bowl movement. She was instructed to follow up for repeat labs this week as previously discussed.  Pearson Grippe, DO IM PGY-1

## 2018-02-24 NOTE — Telephone Encounter (Signed)
Just spoke with patient.  She agreed to an appointment tomorrow 02/25/18 at 3 pm for repeat labs.

## 2018-02-24 NOTE — Progress Notes (Signed)
Case discussed with Dr. Trilby Drummer at the time of the visit.  We reviewed the resident's history and exam and pertinent patient test results.  I agree with the assessment, diagnosis and plan of care documented in the resident's note.  Renal ultrasound with 7 mm non-obstructing stone and no evidence of hydronephrosis.  UA abnormal with 1+ protein, but otherwise not consistent with infection/pyelonephritis.  She was mildly orthostatic.  This, we are left with pre-renal azotemia, which Dr. Trilby Drummer chose to treat with oral hydration and holding lisinopril-HCTZ X 2 days Vs an interstitial nephritis secondary to the cephalosporin therapy.  I am leaning towards the former, which is interveneble VS the latter which usually resolves on its own with the discontinuance of the offending agent.

## 2018-02-25 ENCOUNTER — Other Ambulatory Visit (INDEPENDENT_AMBULATORY_CARE_PROVIDER_SITE_OTHER): Payer: Medicaid Other

## 2018-02-25 ENCOUNTER — Encounter: Payer: Medicaid Other | Admitting: Internal Medicine

## 2018-02-25 DIAGNOSIS — N179 Acute kidney failure, unspecified: Secondary | ICD-10-CM

## 2018-02-25 NOTE — Addendum Note (Signed)
Addended by: Truddie Crumble on: 02/25/2018 02:42 PM   Modules accepted: Orders

## 2018-02-26 LAB — BMP8+ANION GAP
Anion Gap: 14 mmol/L (ref 10.0–18.0)
BUN / CREAT RATIO: 21 (ref 12–28)
BUN: 15 mg/dL (ref 8–27)
CHLORIDE: 100 mmol/L (ref 96–106)
CO2: 26 mmol/L (ref 20–29)
Calcium: 9.4 mg/dL (ref 8.7–10.3)
Creatinine, Ser: 0.73 mg/dL (ref 0.57–1.00)
GFR calc non Af Amer: 88 mL/min/{1.73_m2} (ref >59)
GFR, EST AFRICAN AMERICAN: 101 mL/min/{1.73_m2} (ref >59)
GLUCOSE: 156 mg/dL — AB (ref 65–99)
Potassium: 3.9 mmol/L (ref 3.5–5.2)
SODIUM: 140 mmol/L (ref 134–144)

## 2018-03-04 ENCOUNTER — Encounter: Payer: Medicaid Other | Admitting: Internal Medicine

## 2018-03-08 ENCOUNTER — Other Ambulatory Visit: Payer: Self-pay | Admitting: *Deleted

## 2018-03-08 DIAGNOSIS — IMO0002 Reserved for concepts with insufficient information to code with codable children: Secondary | ICD-10-CM

## 2018-03-08 DIAGNOSIS — E1165 Type 2 diabetes mellitus with hyperglycemia: Secondary | ICD-10-CM

## 2018-03-08 DIAGNOSIS — Z794 Long term (current) use of insulin: Principal | ICD-10-CM

## 2018-03-08 MED ORDER — ACCU-CHEK FASTCLIX LANCETS MISC: 10 refills | Status: DC

## 2018-03-08 NOTE — Telephone Encounter (Signed)
Pharmacist at [facility] called requesting refill on Accu-Chek fastclix lancets. Also requesting refill on lantus but patient is requesting change to pens. Will need rx for pen needles as well. Hubbard Hartshorn, RN, BSN

## 2018-03-08 NOTE — Addendum Note (Signed)
Addended by: Velora Heckler on: 03/08/2018 04:29 PM   Modules accepted: Orders

## 2018-03-09 MED ORDER — INSULIN PEN NEEDLE 31G X 5 MM MISC
2 refills | Status: DC
Start: 2018-03-09 — End: 2018-05-12

## 2018-03-09 MED ORDER — INSULIN GLARGINE 100 UNIT/ML SOLOSTAR PEN: 24.0000 [IU] | PEN_INJECTOR | Freq: Every day | SUBCUTANEOUS | 2 refills | Status: DC

## 2018-03-10 ENCOUNTER — Telehealth: Payer: Self-pay | Admitting: *Deleted

## 2018-03-10 MED ORDER — ACCU-CHEK SOFTCLIX LANCETS MISC: 10 refills | Status: DC

## 2018-03-10 NOTE — Telephone Encounter (Signed)
Angie Cain from [place] called in requesting permission to chang lancets from fastclix to softclix. Ok given and med list updated to reflect change. Hubbard Hartshorn, RN, BSN

## 2018-03-10 NOTE — Telephone Encounter (Signed)
In regards to: Angie Cain from [place] called in requesting permission to chang lancets from fastclix to softclix. Ok given and med list updated to reflect change. Hubbard Hartshorn, RN, BSN   Reply: Thank you for doing that Lauren! I agree.

## 2018-03-16 LAB — HM DIABETES EYE EXAM

## 2018-03-22 ENCOUNTER — Encounter: Payer: Self-pay | Admitting: *Deleted

## 2018-03-31 ENCOUNTER — Encounter: Payer: Self-pay | Admitting: *Deleted

## 2018-04-05 ENCOUNTER — Other Ambulatory Visit: Payer: Self-pay | Admitting: Internal Medicine

## 2018-04-05 DIAGNOSIS — IMO0002 Reserved for concepts with insufficient information to code with codable children: Secondary | ICD-10-CM

## 2018-04-05 DIAGNOSIS — E1165 Type 2 diabetes mellitus with hyperglycemia: Secondary | ICD-10-CM

## 2018-04-05 DIAGNOSIS — Z794 Long term (current) use of insulin: Principal | ICD-10-CM

## 2018-04-22 ENCOUNTER — Encounter: Payer: Medicaid Other | Admitting: Internal Medicine

## 2018-04-22 NOTE — Progress Notes (Deleted)
   CC: ***  HPI:  [name] is a 62 y.o.   Past Medical History:  Diagnosis Date  . Allergy   . Anxiety    occasional  . Arthritis   . Atherosclerotic peripheral vascular disease with intermittent claudication ([place])    s/p fem-fem bypass 2011. ABI 07/2012 0.5 R 0.65 L  . Diabetes mellitus without complication ([place])   . GERD (gastroesophageal reflux disease)   . Hyperlipidemia   . Hypertension   . Personal history of colonic polyps - adenomas 03/07/2014  . Tobacco abuse    Review of Systems:  ***  Physical Exam:  There were no vitals filed for this visit. ***  Assessment & Plan:   See Encounters Tab for problem based charting.  Insulin Dependent Diabetes Mellitus The patient is taking Janmet 50-1000, lantus 24u qhs. The patient's last a1c=9.9 on 02/18/18. The patient's home  blood glucose measurements over the past month have ranged ***. The patient does/does not note episodes of hypoglycemia.  The patient is currently taking ***. He/yes*** or she is compliant with medication.    Patient's weight changes ***   Essential Hypertension The patient's blood pressure during this visit was ***. She is currently taking lisinopril-hydrochlorothiazide 20-25mg  qd.   Major Depressive Disorder The patient's PHQ9 is ***. She is currently on buproprion 100mg  bid.    Health Maintenance Mammogram  Foot exam  Patient {GC/GE:[id number]::"discussed with","seen with"} Dr. {NAMES:[id number]::"Butcher","Granfortuna","E. Hoffman","Klima","Mullen","Narendra","Raines","Vincent"}

## 2018-05-12 ENCOUNTER — Ambulatory Visit: Payer: Medicaid Other | Admitting: Internal Medicine

## 2018-05-12 ENCOUNTER — Other Ambulatory Visit: Payer: Self-pay

## 2018-05-12 VITALS — BP 131/68 | HR 85 | Temp 98.1°F | Ht 65.0 in | Wt 150.4 lb

## 2018-05-12 DIAGNOSIS — F17200 Nicotine dependence, unspecified, uncomplicated: Secondary | ICD-10-CM | POA: Diagnosis not present

## 2018-05-12 DIAGNOSIS — I10 Essential (primary) hypertension: Secondary | ICD-10-CM | POA: Diagnosis not present

## 2018-05-12 DIAGNOSIS — Z794 Long term (current) use of insulin: Secondary | ICD-10-CM | POA: Diagnosis not present

## 2018-05-12 DIAGNOSIS — IMO0002 Reserved for concepts with insufficient information to code with codable children: Secondary | ICD-10-CM

## 2018-05-12 DIAGNOSIS — E1159 Type 2 diabetes mellitus with other circulatory complications: Secondary | ICD-10-CM

## 2018-05-12 DIAGNOSIS — E1165 Type 2 diabetes mellitus with hyperglycemia: Secondary | ICD-10-CM | POA: Diagnosis not present

## 2018-05-12 DIAGNOSIS — F322 Major depressive disorder, single episode, severe without psychotic features: Secondary | ICD-10-CM | POA: Diagnosis not present

## 2018-05-12 DIAGNOSIS — M8580 Other specified disorders of bone density and structure, unspecified site: Secondary | ICD-10-CM | POA: Diagnosis not present

## 2018-05-12 MED ORDER — INSULIN PEN NEEDLE 31G X 5 MM MISC: 2 refills | Status: DC

## 2018-05-12 MED ORDER — BUPROPION HCL 100 MG PO TABS: 100.0000 mg | ORAL_TABLET | Freq: Two times a day (BID) | ORAL | 1 refills | Status: DC

## 2018-05-12 MED ORDER — LISINOPRIL-HYDROCHLOROTHIAZIDE 20-25 MG PO TABS: 1.0000 | ORAL_TABLET | Freq: Every day | ORAL | 3 refills | Status: DC

## 2018-05-12 MED ORDER — SITAGLIPTIN PHOS-METFORMIN HCL 50-1000 MG PO TABS: 1.0000 | ORAL_TABLET | Freq: Two times a day (BID) | ORAL | 1 refills | Status: DC

## 2018-05-12 MED ORDER — GLUCOSE BLOOD VI STRP: ORAL_STRIP | 11 refills | Status: DC

## 2018-05-12 MED ORDER — CALCIUM CARBONATE-VITAMIN D 500-200 MG-UNIT PO TABS: 1.0000 | ORAL_TABLET | Freq: Two times a day (BID) | ORAL | 3 refills | Status: DC

## 2018-05-12 NOTE — Assessment & Plan Note (Signed)
Osteopenia: Refilled calcium vit D supplement

## 2018-05-12 NOTE — Progress Notes (Signed)
   CC: Pruritis and pap smear  HPI:  [name] is a 62 y.o. female with insulin dependent diabetes mellitus, essential hypertension, hypertension, atherosclerotic peripheral vascular disease, and hyperlipidemia who presented for pruritis and pap smar. Please see problem based charting for evaluation, assessment, and plan.   Past Medical History:  Diagnosis Date  . Allergy   . Anxiety    occasional  . Arthritis   . Atherosclerotic peripheral vascular disease with intermittent claudication ([place])    s/p fem-fem bypass 2011. ABI 07/2012 0.5 R 0.65 L  . Diabetes mellitus without complication ([place])   . GERD (gastroesophageal reflux disease)   . Hyperlipidemia   . Hypertension   . Personal history of colonic polyps - adenomas 03/07/2014  . Tobacco abuse    Review of Systems:   Denies sob, chest pain, headaches  Physical Exam:  Vitals:   05/13/18 1601  BP: 122/70  Pulse: 70  Temp: 98.9 F (37.2 C)  TempSrc: Oral  SpO2: 100%  Weight: 151 lb 4.8 oz (68.6 kg)  Height: 5\' 5"  (1.651 m)   Physical Exam  Constitutional: She appears well-developed and well-nourished. No distress.  HENT:  Head: Normocephalic and atraumatic.  Eyes: Conjunctivae are normal.  Cardiovascular: Normal rate, regular rhythm and normal heart sounds.  Respiratory: Effort normal and breath sounds normal. No respiratory distress. She has no wheezes.  GI: Soft. Bowel sounds are normal. She exhibits no distension. There is no tenderness.  Musculoskeletal: She exhibits no edema.  Neurological: She is alert.  Skin: Rash noted. She is not diaphoretic. No erythema.  Psychiatric: She has a normal mood and affect. Her behavior is normal. Judgment and thought content normal.     Assessment & Plan:   See Encounters Tab for problem based charting.  Insulin dependent diabetes mellitus type 2 Th patient's last A1C=9.9 in March 2018. The patient currently takes janumet 50-1000mg  bid and lantus 24u qhs. The  patient's blood glucose at home 110s-280s. The patient has gained 9lbs since march 2019.   Assessment Follow up in 1 month to monitor progression of diabetes  Recommended continuing janumet 50-1000mg  bid and lantus 24u qhs  Essential Hypertension The patient's blood pressure during this visit was 122/70. She is taking lisinopril-hydrochlorothiazide 20-25mg  qd.   -Recommending continuing lisionpril-hctz 20-25mg  qd  Pruritus The patient presented with a few day history of itching and erythema over neck, lower abdomen and upper back. The patient's rash is erythematous 1cm in size that appears like wheals. She denies any change in detergent, perfumes, or soaps which makes contact dermatitis less likely.   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 Recommended patient use eucerin cream and topical benadryl.    Healthcare maintenance The patient's last pap smear was done in 2012 and resulted with negative intraepithelial lesions or malignancies. The patient has a partial hysterectomy. The patient is sexually active only with her husband and they do not use protection.   -Ordered cervicovaginal testing and cytology pap smear -HIV test pending   Patient discussed with Dr. Dareen Piano

## 2018-05-12 NOTE — Progress Notes (Signed)
CC: Routine visit for HTN, diabetes, tobacco abuse and depression evaluation.   HPI:Angie Cain is a 62 y.o. female who presents today for evaluation of her routine medical condition and medication management regarding her diabetes, tobacco, HTN and depression.   HTN: 131/68. NO acute changes indicated.  We will continue the Lisinopril/HCTZ combo tablet. I will order and evaluate a CMP to ensure that her renal function has remained normalized given her attestation to occasional difficulty urinating.    Depression: Patient continues to endorse anhedonia, increased sleep, depressive symptoms, increased appetite, fatigue but denies suicidal thoughts or ideations.  She had not previously completed her bupropion prescription due to the black box warning of suicidality.  She was concerned that this would increase her risk for suicide placing these thoughts within her head possibly prompting her to eventually attempt suicide.  She was informed that the medication itself has not been associated with initiating such thoughts but instead it leads to a staggered approach with improvement in symptoms. First to recover is the cessation of anhedonia leading to increased energy and motivation prior to decreasing depressive symptoms. As such this can potentially lead to increased risk of suicidality particularly in adolescence patients as their motivation returns and with underlying suicidal thoughts they are now motivated to possibly complete them.  She denied suicidal thoughts or ideations today and did not have a plan to complete them. She stated that she is now seeing a therapist and would call them, the hotline or her husband if such thoughts were to occur.  Plan: Continue seeing the therapist. Wellbutrin 100mg  BID Return in 4-6 weeks. Patient preferred to see her PCP on July 12th.   Tobacco abuse: Has decreased her count to 2-3 per day. As per depression A/P we will recommend continuing bupropion  100mg  BID to assist with this as well. I feel that with better control of her depressive symptoms she will continue to improve and be able to complete her tobacco cessation process.  Osteopenia: Refilled calcium vit D supplement  Diabetes: Last A1c 9.9 on 02/18/2018. Will need A1c at next visit.  Glucometer report-Average 132, % w/in range 87, % above range 13, % below range 0 No acute changes indicated.  Refilled test strips and pen needles today.   Past Medical History:  Diagnosis Date  . Allergy   . Anxiety    occasional  . Arthritis   . Atherosclerotic peripheral vascular disease with intermittent claudication ([place])    s/p fem-fem bypass 2011. ABI 07/2012 0.5 R 0.65 L  . Diabetes mellitus without complication ([place])   . GERD (gastroesophageal reflux disease)   . Hyperlipidemia   . Hypertension   . Personal history of colonic polyps - adenomas 03/07/2014  . Tobacco abuse    Review of Systems:   Review of Systems  Constitutional: Negative for chills, diaphoresis and fever.  HENT: Negative for ear pain and sinus pain.   Eyes: Negative for blurred vision, photophobia and redness.  Respiratory: Negative for cough and shortness of breath.   Cardiovascular: Negative for chest pain and leg swelling.  Gastrointestinal: Positive for heartburn, nausea and vomiting. Negative for constipation and diarrhea.  Genitourinary: Negative for flank pain and urgency.  Musculoskeletal: Negative for myalgias.  Neurological: Negative for dizziness and headaches.  Psychiatric/Behavioral: Positive for depression. Negative for memory loss, substance abuse and suicidal ideas. The patient is not nervous/anxious.    Physical Exam:  Vitals:   05/12/18 0933  BP: 131/68  Pulse: 85  Temp: 98.1  F (36.7 C)  TempSrc: Oral  SpO2: 100%  Weight: 150 lb 6.4 oz (68.2 kg)  Height: 5\' 5"  (1.651 m)   Physical Exam  Constitutional: She is oriented to person, place, and time. She appears well-developed and  well-nourished. No distress.  HENT:  Head: Normocephalic and atraumatic.  Eyes: Conjunctivae and EOM are normal.  Neck: Normal range of motion. Neck supple.  Cardiovascular: Normal rate and regular rhythm.  No murmur heard. Pulmonary/Chest: Effort normal and breath sounds normal. No stridor. No respiratory distress.  Abdominal: Soft. Bowel sounds are normal. She exhibits no distension. There is no tenderness.  Musculoskeletal: She exhibits no edema or tenderness.  Neurological: She is alert and oriented to person, place, and time.  Skin: Skin is warm. Capillary refill takes less than 2 seconds. She is not diaphoretic.  Psychiatric: She has a normal mood and affect.  Vitals reviewed.  Assessment & Plan:   See Encounters Tab for problem based charting.  Patient discussed with Dr. Dareen Piano

## 2018-05-12 NOTE — Assessment & Plan Note (Signed)
HTN: 131/68. NO acute changes indicated.  We will continue the Lisinopril/HCTZ combo tablet. I will order and evaluate a CMP to ensure that her renal function has remained normalized given her attestation to occasional difficulty urinating.

## 2018-05-12 NOTE — Patient Instructions (Addendum)
FOLLOW-UP INSTRUCTIONS When: On July 12th with Dr. Maricela Bo. Or sooner if needed For: Routine visit What to bring: All of your medications and your glucometer  Thank you for your visit to the [facility] today.  I have ordered blood work to evaluate your for any potential issue that may be contributing to your itching.  I have refilled your medications and testing strips/needles as requested.   Please continue to take your Wellbutrin. I have refilled this. We   If you have any questions or concerns please feel free to call us at any time.

## 2018-05-12 NOTE — Progress Notes (Signed)
Internal Medicine Clinic Attending  Case discussed with Dr. Harbrecht at the time of the visit.  We reviewed the resident's history and exam and pertinent patient test results.  I agree with the assessment, diagnosis, and plan of care documented in the resident's note.   

## 2018-05-12 NOTE — Assessment & Plan Note (Signed)
Tobacco abuse: Has decreased her count to 2-3 per day. As per depression A/P we will recommend continuing bupropion 100mg  BID to assist with this as well. I feel that with better control of her depressive symptoms she will continue to improve and be able to complete her tobacco cessation process.

## 2018-05-12 NOTE — Assessment & Plan Note (Signed)
Diabetes: Last A1c 9.9 on 02/18/2018. Will need A1c at next visit.  Glucometer report-Average 132, % w/in range 87, % above range 13, % below range 0 No acute changes indicated.  Refilled test strips and pen needles today.

## 2018-05-12 NOTE — Assessment & Plan Note (Signed)
  Depression: Patient continues to endorse anhedonia, increased sleep, depressive symptoms, increased appetite, fatigue but denies suicidal thoughts or ideations.  She had not previously completed her bupropion prescription due to the black box warning of suicidality.  She was concerned that this would increase her risk for suicide placing these thoughts within her head possibly prompting her to eventually attempt suicide.  She was informed that the medication itself has not been associated with initiating such thoughts but instead it leads to a staggered approach with improvement in symptoms. First to recover is the cessation of anhedonia leading to increased energy and motivation prior to decreasing depressive symptoms. As such this can potentially lead to increased risk of suicidality particularly in adolescence patients as their motivation returns and with underlying suicidal thoughts they are now motivated to possibly complete them.  She denied suicidal thoughts or ideations today and did not have a plan to complete them. She stated that she is now seeing a therapist and would call them, the hotline or her husband if such thoughts were to occur.  Plan: Continue seeing the therapist. Wellbutrin 100mg  BID Return in 4-6 weeks. Patient preferred to see her PCP on July 12th.

## 2018-05-13 ENCOUNTER — Other Ambulatory Visit (HOSPITAL_COMMUNITY)
Admission: RE | Admit: 2018-05-13 | Discharge: 2018-05-13 | Disposition: A | Discharge status: Home / Self Care | Payer: Medicaid Other | Source: Ambulatory Visit | Attending: Internal Medicine | Admitting: Internal Medicine

## 2018-05-13 ENCOUNTER — Ambulatory Visit (INDEPENDENT_AMBULATORY_CARE_PROVIDER_SITE_OTHER): Payer: Medicaid Other | Admitting: Internal Medicine

## 2018-05-13 ENCOUNTER — Other Ambulatory Visit: Payer: Self-pay

## 2018-05-13 VITALS — BP 122/70 | HR 70 | Temp 98.9°F | Ht 65.0 in | Wt 151.3 lb

## 2018-05-13 DIAGNOSIS — K219 Gastro-esophageal reflux disease without esophagitis: Secondary | ICD-10-CM | POA: Insufficient documentation

## 2018-05-13 DIAGNOSIS — I739 Peripheral vascular disease, unspecified: Secondary | ICD-10-CM | POA: Diagnosis not present

## 2018-05-13 DIAGNOSIS — Z0000 Encounter for general adult medical examination without abnormal findings: Secondary | ICD-10-CM | POA: Insufficient documentation

## 2018-05-13 DIAGNOSIS — Z90711 Acquired absence of uterus with remaining cervical stump: Secondary | ICD-10-CM

## 2018-05-13 DIAGNOSIS — Z79899 Other long term (current) drug therapy: Secondary | ICD-10-CM | POA: Diagnosis not present

## 2018-05-13 DIAGNOSIS — N898 Other specified noninflammatory disorders of vagina: Secondary | ICD-10-CM | POA: Insufficient documentation

## 2018-05-13 DIAGNOSIS — Z794 Long term (current) use of insulin: Secondary | ICD-10-CM | POA: Insufficient documentation

## 2018-05-13 DIAGNOSIS — L299 Pruritus, unspecified: Secondary | ICD-10-CM | POA: Diagnosis not present

## 2018-05-13 DIAGNOSIS — Z720 Tobacco use: Secondary | ICD-10-CM | POA: Diagnosis not present

## 2018-05-13 DIAGNOSIS — E1165 Type 2 diabetes mellitus with hyperglycemia: Secondary | ICD-10-CM

## 2018-05-13 DIAGNOSIS — I10 Essential (primary) hypertension: Secondary | ICD-10-CM | POA: Diagnosis not present

## 2018-05-13 DIAGNOSIS — E119 Type 2 diabetes mellitus without complications: Secondary | ICD-10-CM | POA: Insufficient documentation

## 2018-05-13 DIAGNOSIS — E785 Hyperlipidemia, unspecified: Secondary | ICD-10-CM | POA: Diagnosis not present

## 2018-05-13 DIAGNOSIS — E1151 Type 2 diabetes mellitus with diabetic peripheral angiopathy without gangrene: Secondary | ICD-10-CM

## 2018-05-13 DIAGNOSIS — IMO0002 Reserved for concepts with insufficient information to code with codable children: Secondary | ICD-10-CM

## 2018-05-13 DIAGNOSIS — I70319 Atherosclerosis of unspecified type of bypass graft(s) of the extremities with intermittent claudication, unspecified extremity: Secondary | ICD-10-CM | POA: Diagnosis not present

## 2018-05-13 LAB — CMP14 + ANION GAP
ALK PHOS: 75 IU/L (ref 39–117)
ALT: 6 IU/L (ref 0–32)
AST: 9 IU/L (ref 0–40)
Albumin/Globulin Ratio: 1.7 (ref 1.2–2.2)
Albumin: 4.2 g/dL (ref 3.6–4.8)
Anion Gap: 19 mmol/L — ABNORMAL HIGH (ref 10.0–18.0)
BUN / CREAT RATIO: 15 (ref 12–28)
BUN: 11 mg/dL (ref 8–27)
CHLORIDE: 101 mmol/L (ref 96–106)
CO2: 23 mmol/L (ref 20–29)
Calcium: 9.4 mg/dL (ref 8.7–10.3)
Creatinine, Ser: 0.72 mg/dL (ref 0.57–1.00)
GFR calc Af Amer: 103 mL/min/{1.73_m2} (ref >59)
GFR calc non Af Amer: 89 mL/min/{1.73_m2} (ref >59)
GLUCOSE: 171 mg/dL — AB (ref 65–99)
Globulin, Total: 2.5 g/dL (ref 1.5–4.5)
Potassium: 3.8 mmol/L (ref 3.5–5.2)
Sodium: 143 mmol/L (ref 134–144)
Total Protein: 6.7 g/dL (ref 6.0–8.5)

## 2018-05-13 NOTE — Patient Instructions (Addendum)
It was a pleasure to see you today [name]. Please make the following changes:  -Please use eucerin cream and benadryl cream for your itching -Avoid hot showers -Continue taking all your medication -Return to clinic if the itching continues   If you have any questions or concerns, please call our clinic at [phone] between 9am-5pm and after hours call [phone] and ask for the internal medicine resident on call. If you feel you are having a medical emergency please call 911.   Thank you, we look forward to help you remain healthy!  Lars Mage, MD Internal Medicine PGY1    Pruritus Pruritus is an itching feeling. There are many different conditions and factors that can make your skin itchy. Dry skin is one of the most common causes of itching. Most cases of itching do not require medical attention. Itchy skin can turn into a rash. Follow these instructions at home: Watch your pruritus for any changes. Take these steps to help with your condition: Skin Care  Moisturize your skin as needed. A moisturizer that contains petroleum jelly is best for keeping moisture in your skin.  Take or apply medicines only as directed by your health care provider. This may include: ? Corticosteroid cream. ? Anti-itch lotions. ? Oral anti-histamines.  Apply cool compresses to the affected areas.  Try taking a bath with: ? Epsom salts. Follow the instructions on the packaging. You can get these at your local pharmacy or grocery store. ? Baking soda. Pour a small amount into the bath as directed by your health care provider. ? Colloidal oatmeal. Follow the instructions on the packaging. You can get this at your local pharmacy or grocery store.  Try applying baking soda paste to your skin. Stir water into baking soda until it reaches a paste-like consistency.  Do not scratch your skin.  Avoid hot showers or baths, which can make itching worse. A cold shower may help with itching as long  as you use a moisturizer after.  Avoid scented soaps, detergents, and perfumes. Use gentle soaps, detergents, perfumes, and other cosmetic products. General instructions  Avoid wearing tight clothes.  Keep a journal to help track what causes your itch. Write down: ? What you eat. ? What cosmetic products you use. ? What you drink. ? What you wear. This includes jewelry.  Use a humidifier. This keeps the air moist, which helps to prevent dry skin. Contact a health care provider if:  The itching does not go away after several days.  You sweat at night.  You have weight loss.  You are unusually thirsty.  You urinate more than normal.  You are more tired than normal.  You have abdominal pain.  Your skin tingles.  You feel weak.  Your skin or the whites of your eyes look yellow (jaundice).  Your skin feels numb. This information is not intended to replace advice given to you by your health care provider. Make sure you discuss any questions you have with your health care provider. Document Released: 08/12/2011 Document Revised: 05/07/2016 Document Reviewed: 11/26/2014 Elsevier Interactive Patient Education  [employer].

## 2018-05-15 DIAGNOSIS — L509 Urticaria, unspecified: Secondary | ICD-10-CM | POA: Insufficient documentation

## 2018-05-15 NOTE — Assessment & Plan Note (Signed)
Th patient's last A1C=9.9 in March 2018. The patient currently takes janumet 50-1000mg  bid and lantus 24u qhs. The patient's blood glucose at home 110s-280s. The patient has gained 9lbs since march 2019.   Assessment Follow up in 1 month to monitor progression of diabetes  Recommended continuing janumet 50-1000mg  bid and lantus 24u qhs

## 2018-05-15 NOTE — Assessment & Plan Note (Signed)
The patient presented with a few day history of itching and erythema over neck, lower abdomen and upper back. The patient's rash is erythematous 1cm in size that appears like wheals. She denies any change in detergent, perfumes, or soaps which makes contact dermatitis less likely.   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 Recommended patient use eucerin cream and topical benadryl.

## 2018-05-15 NOTE — Assessment & Plan Note (Signed)
The patient's last pap smear was done in 2012 and resulted with negative intraepithelial lesions or malignancies. The patient has a partial hysterectomy. The patient is sexually active only with her husband and they do not use protection.   -Ordered cervicovaginal testing and cytology pap smear -HIV test pending

## 2018-05-15 NOTE — Assessment & Plan Note (Signed)
The patient's blood pressure during this visit was 122/70. She is taking lisinopril-hydrochlorothiazide 20-25mg  qd.   -Recommending continuing lisionpril-hctz 20-25mg  qd

## 2018-05-16 LAB — CERVICOVAGINAL ANCILLARY ONLY
BACTERIAL VAGINITIS: NEGATIVE
Candida vaginitis: NEGATIVE
Chlamydia: NEGATIVE
NEISSERIA GONORRHEA: NEGATIVE
TRICH (WINDOWPATH): NEGATIVE

## 2018-05-18 ENCOUNTER — Telehealth: Payer: Self-pay | Admitting: Dietician

## 2018-05-18 ENCOUNTER — Encounter: Payer: Self-pay | Admitting: Internal Medicine

## 2018-05-18 ENCOUNTER — Ambulatory Visit: Payer: Medicaid Other | Admitting: Internal Medicine

## 2018-05-18 ENCOUNTER — Other Ambulatory Visit: Payer: Self-pay

## 2018-05-18 DIAGNOSIS — IMO0002 Reserved for concepts with insufficient information to code with codable children: Secondary | ICD-10-CM

## 2018-05-18 DIAGNOSIS — Z888 Allergy status to other drugs, medicaments and biological substances status: Secondary | ICD-10-CM | POA: Diagnosis not present

## 2018-05-18 DIAGNOSIS — F17200 Nicotine dependence, unspecified, uncomplicated: Secondary | ICD-10-CM

## 2018-05-18 DIAGNOSIS — F339 Major depressive disorder, recurrent, unspecified: Secondary | ICD-10-CM | POA: Diagnosis not present

## 2018-05-18 DIAGNOSIS — E1151 Type 2 diabetes mellitus with diabetic peripheral angiopathy without gangrene: Secondary | ICD-10-CM | POA: Diagnosis not present

## 2018-05-18 DIAGNOSIS — L509 Urticaria, unspecified: Secondary | ICD-10-CM

## 2018-05-18 DIAGNOSIS — F320 Major depressive disorder, single episode, mild: Secondary | ICD-10-CM

## 2018-05-18 DIAGNOSIS — E1165 Type 2 diabetes mellitus with hyperglycemia: Secondary | ICD-10-CM

## 2018-05-18 DIAGNOSIS — R220 Localized swelling, mass and lump, head: Secondary | ICD-10-CM

## 2018-05-18 DIAGNOSIS — I10 Essential (primary) hypertension: Secondary | ICD-10-CM

## 2018-05-18 DIAGNOSIS — Z794 Long term (current) use of insulin: Principal | ICD-10-CM

## 2018-05-18 DIAGNOSIS — Z79899 Other long term (current) drug therapy: Secondary | ICD-10-CM | POA: Diagnosis not present

## 2018-05-18 DIAGNOSIS — L853 Xerosis cutis: Secondary | ICD-10-CM | POA: Diagnosis not present

## 2018-05-18 LAB — CYTOLOGY - PAP
Diagnosis: NEGATIVE
HPV: NOT DETECTED

## 2018-05-18 MED ORDER — EPINEPHRINE 0.3 MG/0.3ML IJ SOAJ: 0.3000 mg | Freq: Once | INTRAMUSCULAR | 0 refills | Status: AC

## 2018-05-18 MED ORDER — PREDNISONE 20 MG PO TABS: 20.0000 mg | ORAL_TABLET | Freq: Every day | ORAL | 0 refills | Status: AC

## 2018-05-18 MED ORDER — ACCU-CHEK AVIVA PLUS W/DEVICE KIT: PACK | 1 refills | Status: DC

## 2018-05-18 MED ORDER — DIPHENHYDRAMINE HCL 25 MG PO TABS: 25.0000 mg | ORAL_TABLET | Freq: Three times a day (TID) | ORAL | 0 refills | Status: DC | PRN

## 2018-05-18 NOTE — Assessment & Plan Note (Signed)
Assessment Suspected allergic reaction to bupropion, as indicated under separate A&P. Patient reports bupropion was helpful in helping to decrease her cravings. Discontinuing bupropion today due to suspected allergic reaction - discussed alternate options including nicotine patches or gum. Patient thinks she will be able to quit smoking without needing these therapies.  Plan - Discontinue bupropion - Counseled on smoking cessation

## 2018-05-18 NOTE — Patient Instructions (Addendum)
FOLLOW-UP INSTRUCTIONS When: 2 months with PCP For: follow up of diabetes What to bring: medications   Angie Cain,  It was nice to meet you today.  Like we talked about, we think you are having an allergic reaction to Bupropion. Please STOP taking bupropion. I have listed this as an allergy on your chart.  - For the itching and rash, please take benadryl 25mg  every 8 hours as needed. This medicine can make you sleepy, so it might be good to take it at bedtime or when you have some time to relax.  - Please also take prednisone 20mg  daily with breakfast for 4 days. - I am also prescribing you an Epi-Pen to be used in an emergency situation. If you start having lightheadedness, or nausea/vomiting, please inject yourself with the Epi-Pen in your thigh and come to the hospital.  To help with your smoking, you can get nicotine patches, gum, or inhalers over the counter.  If you have any questions or concerns, call our clinic at [phone] or after hours call [phone] and ask for the internal medicine resident on call.

## 2018-05-18 NOTE — Assessment & Plan Note (Addendum)
Assessment She has had some improvement with eucerin cream and benadryl, however continues to have hives and now has new facial and lip swelling. She thinks that this started after restarting bupropion and taking it more regularly. She has not had a reaction like this before. She has not started any other new medications or supplements. She is on lisinopril, but has been on this for years.  I suspect a moderate allergic reaction to bupropion given the timing of the onset of her symptoms. Other than cutaneous involvement, she has no evidence of involvement of another organ system, including respiratory, cardiovascular, or GI. I have instructed patient to stop taking bupropion and have added this to her allergy list. Will provide symptomatic therapy for her pruritus and rash, as well as provide prescription for Epi-Pen. Provided instructions regarding when to self-administer Epi-Pen, which include if she develops lightheadedness or N/V which would suggest involvement of 2 organ systems. Also advised to call 911 if she does use the Epi-Pen. Patient expressed understanding and will pick up these medications today.  Plan - Oral benadryl 25mg  every 8 hours as needed for itching - PO prednisone 20mg  daily with breakfast x4 days - Stop bupropion - Rx for Epi-Pen provided

## 2018-05-18 NOTE — Assessment & Plan Note (Signed)
Discontinued bupropion today 2/2 suspected allergic reaction. Recommend alternate therapy for depression, like SSRI, at next visit. - Continue counseling - D/c bupropion - F/u with PCP

## 2018-05-18 NOTE — Progress Notes (Signed)
   CC: pruritus and face swelling  HPI:  [name] is a 62 y.o. female with PMH of HTN, diabetes, and PVD who presents for follow up of pruritus and face swelling.  She was seen in clinic on 6/3 with these complaints and diagnosed with xerosis. She was advised to use emollients and benadryl for the itching. These did provide some relief, but she reports persistent pruritus and now worsened face and lip swelling. She has been on lisinopril-HCTZ for years. The only new medication she has is bupropion. She thought this was causing the symptoms so she decreased the dose to once daily. This did provide some relief, but she still has the itching and swelling. Her pruritus is particularly under her armpits, under her breasts, along her medial thighs, and on her posterior thighs. She denies SOB or wheezing. She has not had this reaction before.  The bupropion was restarted on 5/30 for assistance with smoking cessation. Since restarting this medication, she does note decreases in her cravings, however she is still smoking some. She has never tried nicotine patches, gum, or inhalers.  Past Medical History:  Diagnosis Date  . Allergy   . Anxiety    occasional  . Arthritis   . Atherosclerotic peripheral vascular disease with intermittent claudication ([place])    s/p fem-fem bypass 2011. ABI 07/2012 0.5 R 0.65 L  . Diabetes mellitus without complication ([place])   . GERD (gastroesophageal reflux disease)   . Hyperlipidemia   . Hypertension   . Personal history of colonic polyps - adenomas 03/07/2014  . Tobacco abuse    Review of Systems:   GEN: Negative for fevers NEURO: negative for lightheadedness SKIN: Positive for urticaria, pruritus, and facial swelling PULM: Negative for cough or SOB ABD: Negative for N/V EXT: Negative for LE swelling  Physical Exam:  Vitals:   05/18/18 1351  BP: 130/64  Pulse: 94  Temp: 98.3 F (36.8 C)  TempSrc: Oral  SpO2: 100%  Weight: 147 lb (66.7 kg)    GEN: Sitting comfortably in chair in NAD HENT: Swelling noted of bilateral cheeks and upper lip. No tonsillar or uvular swelling. CV: NR & RR, no m/r/g PULM: CTAB, no wheezes or rales EXT: No LE edema SKIN: Several hives without erythema noted under bilateral breasts and in axillary areas  Assessment & Plan:   See Encounters Tab for problem based charting.  Patient seen with Dr. Evette Doffing

## 2018-05-18 NOTE — Telephone Encounter (Signed)
She has not been abel to check her blood sugar in a week because she lost her meter. She requests a prescription be sent to [facility] pharmacy for anew one. If she is unable to get one from them, she knows she can get a sample ,eter here from [place].

## 2018-05-18 NOTE — Telephone Encounter (Signed)
Not a problem. I signed order for new meter. Thanks!  Rhylie Stehr

## 2018-05-19 ENCOUNTER — Telehealth: Payer: Self-pay | Admitting: Pharmacist

## 2018-05-19 NOTE — Progress Notes (Signed)
Internal Medicine Clinic Attending  I saw and evaluated the patient.  I personally confirmed the key portions of the history and exam documented by Dr. Ronalee Red and I reviewed pertinent patient test results.  The assessment, diagnosis, and plan were formulated together and I agree with the documentation in the resident's note.  Patient here with allergic reaction to buproprion based on time course. She has only mild cutaneous symptoms, no GI, cardiovascular or respiratory symptoms. We will treat supportively, gave Epi pen and instructions about use if she develops signs of anaphylaxis, which seems low risk at this point.

## 2018-05-19 NOTE — Addendum Note (Signed)
Addended by: Lalla Brothers T on: 05/19/2018 11:51 AM   Modules accepted: Level of Service

## 2018-05-19 NOTE — Progress Notes (Signed)
Steroid-Induced Hyperglycemia Prevention and Management Angie Cain is a 62 y.o. female who meets criteria for [facility] glucose monitoring program (diabetes patient prescribed short course of steroids).  A/P Current Regimen  Patient prescribed prednisone 40 mg daily x 4 days, currently on day 2 of therapy.  Current DM regimen Janumet and Lantus  Home BG Monitoring  Patient does have a meter at home and does check BG at home.   BGs at home 174 this morning  BGs prior to steroid course reported as 120, A1C prior to steroid course 9.9  S/Sx of hyper- or hypoglycemia: none, none  Medication Management No changes were made today. Patient prefers [place] to follow up with her on Monday. Advised patient to increase Lantus by 2 units daily if BG > 200. Patient verbalized understanding by repeat back.  Patient Education  Advised patient to continue monitoring BG while on steroid therapy (at least twice daily prior to first 2 meals of the day).  Patient educated about signs/symptoms and advised to contact clinic if hyper- or hypoglycemic.  Follow-up Monday  Flossie Dibble 5:54 PM 05/19/2018

## 2018-05-23 NOTE — Progress Notes (Signed)
Internal Medicine Clinic Attending  Case discussed with Dr. Chundi at the time of the visit.  We reviewed the resident's history and exam and pertinent patient test results.  I agree with the assessment, diagnosis, and plan of care documented in the resident's note. 

## 2018-05-25 ENCOUNTER — Telehealth: Payer: Self-pay | Admitting: *Deleted

## 2018-05-25 NOTE — Telephone Encounter (Signed)
Information was given to [facility] Tr[facility] for [place] for Janumet 50-1000.  Approved 05/25/2018 thru 05/21/2019.  Sander Nephew, RN 05/25/2018 9:37 AM.

## 2018-05-27 ENCOUNTER — Other Ambulatory Visit: Payer: Self-pay

## 2018-05-27 ENCOUNTER — Telehealth: Payer: Self-pay | Admitting: *Deleted

## 2018-05-27 ENCOUNTER — Emergency Department (HOSPITAL_COMMUNITY)
Admission: EM | Admit: 2018-05-27 | Discharge: 2018-05-27 | Disposition: A | Discharge status: Home / Self Care | Payer: Medicaid Other | Attending: Emergency Medicine | Admitting: Emergency Medicine

## 2018-05-27 ENCOUNTER — Encounter (HOSPITAL_COMMUNITY): Payer: Self-pay

## 2018-05-27 DIAGNOSIS — T783XXD Angioneurotic edema, subsequent encounter: Secondary | ICD-10-CM

## 2018-05-27 DIAGNOSIS — F17210 Nicotine dependence, cigarettes, uncomplicated: Secondary | ICD-10-CM | POA: Diagnosis not present

## 2018-05-27 DIAGNOSIS — Z794 Long term (current) use of insulin: Secondary | ICD-10-CM | POA: Diagnosis not present

## 2018-05-27 DIAGNOSIS — E119 Type 2 diabetes mellitus without complications: Secondary | ICD-10-CM | POA: Diagnosis not present

## 2018-05-27 DIAGNOSIS — R21 Rash and other nonspecific skin eruption: Secondary | ICD-10-CM | POA: Diagnosis present

## 2018-05-27 DIAGNOSIS — I10 Essential (primary) hypertension: Secondary | ICD-10-CM | POA: Insufficient documentation

## 2018-05-27 DIAGNOSIS — Z79899 Other long term (current) drug therapy: Secondary | ICD-10-CM | POA: Diagnosis not present

## 2018-05-27 DIAGNOSIS — L509 Urticaria, unspecified: Secondary | ICD-10-CM | POA: Insufficient documentation

## 2018-05-27 MED ORDER — DIPHENHYDRAMINE HCL 50 MG/ML IJ SOLN
25.0000 mg | Freq: Once | INTRAMUSCULAR | Status: AC
  Administered 2018-05-27: 25 mg via INTRAVENOUS
  Filled 2018-05-27: qty 1

## 2018-05-27 MED ORDER — PREDNISONE 20 MG PO TABS: ORAL_TABLET | ORAL | 0 refills | Status: DC

## 2018-05-27 MED ORDER — METHYLPREDNISOLONE SODIUM SUCC 125 MG IJ SOLR
125.0000 mg | Freq: Once | INTRAMUSCULAR | Status: AC
  Administered 2018-05-27: 125 mg via INTRAVENOUS
  Filled 2018-05-27: qty 2

## 2018-05-27 NOTE — Telephone Encounter (Signed)
Patient walked in requesting to be seen for continued allergic reaction. Lips obviously swollen. No available appts in [facility] or with PCP today. Patient states she will go directly to ED. Hubbard Hartshorn, RN, BSN

## 2018-05-27 NOTE — ED Triage Notes (Signed)
Patient here with ongoing itchy rash to body after stopping Wellbutrin several weeks ago, has been seen multiple times in OP clinic and has taken steroids for same. Patient alert and oriented, NAD

## 2018-05-27 NOTE — Discharge Instructions (Signed)
You have been evaluated for your condition.  It is likely due to an allergic reaction.  Stop taking your lisinopril and discuss this with your doctor.  Take steroid prolonged course but monitor your blood sugar carefully. Follow up with allergist for further assessment of your condition.

## 2018-05-27 NOTE — ED Notes (Signed)
Patient verbalizes understanding of discharge instructions. Opportunity for questioning and answers were provided. Armband removed by staff, pt discharged from ED.  

## 2018-05-27 NOTE — ED Provider Notes (Signed)
[facility] EMERGENCY DEPARTMENT Provider Note   CSN: [id number] Arrival date & time: 05/27/18  0920     History   Chief Complaint No chief complaint on file.   HPI Angie Cain is a 62 y.o. female.  HPI   Old female presenting for evaluation of a rash.  Patient developed an allergic rash to bupropion several weeks prior.  Her rash was described as an itchy hives throughout her body with lip swelling.  She saw her PCP for this 3 weeks ago.  She was told to discontinue her Wellbutrin.  Her symptoms persist.  She was last seen on June fifth for an office visit.  At which time, patient was given steroid, Benadryl cream and Benadryl p.o.  Patient have been compliant with the medication but noticed no improvement, prompting her to come here today.  She still endorse an itchy rash to her body and lip swelling which has remained the same.  No report of fever, headache, lightheadedness, dizziness, chest pain, shortness of breath, wheezing, throat swelling, abdominal cramping.  She denies any other environmental changes.  She is frustrated with her current course.  Past Medical History:  Diagnosis Date  . Allergy   . Anxiety    occasional  . Arthritis   . Atherosclerotic peripheral vascular disease with intermittent claudication ([place])    s/p fem-fem bypass 2011. ABI 07/2012 0.5 R 0.65 L  . Diabetes mellitus without complication ([place])   . GERD (gastroesophageal reflux disease)   . Hyperlipidemia   . Hypertension   . Personal history of colonic polyps - adenomas 03/07/2014  . Tobacco abuse     Patient Active Problem List   Diagnosis Date Noted  . Hives 05/15/2018  . Acute renal failure ([place]) 02/21/2018  . Abdominal pain 02/19/2018  . Allergic rhinitis 10/16/2017  . Major depression 10/16/2017  . Iron deficiency anemia 05/08/2017  . Healthcare maintenance 03/18/2017  . Eye discharge 07/31/2016  . Rotator cuff impingement syndrome of left shoulder 03/23/2016  .  Osteopenia determined by x-ray 01/25/2015  . Bilateral plantar fasciitis 09/30/2014  . Hx of adenomatous colonic polyps 03/07/2014  . Low back pain 12/18/2013  . GERD (gastroesophageal reflux disease) 06/06/2013  . Hyperlipidemia associated with type 2 diabetes mellitus ([place]) 06/06/2013  . Tobacco use disorder 07/21/2007  . Essential hypertension 07/21/2007  . IDDM (insulin dependent diabetes mellitus) ([place]) 07/21/2007  . Insulin dependent type 2 diabetes mellitus, uncontrolled ([place]) 07/20/1993    Past Surgical History:  Procedure Laterality Date  . VAGINAL HYSTERECTOMY    . 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 Authorizing Provider  ACCU-CHEK SOFTCLIX LANCETS lancets Use to check blood sugar up to 3 times a day 03/10/18   Lars Mage, MD  aspirin EC 81 MG tablet Take 81 mg by mouth daily.    [provider]  atorvastatin (LIPITOR) 40 MG tablet Take 1 tablet (40 mg total) by mouth daily. 08/13/17   Annia Belt, MD  Blood Glucose Monitoring Suppl (ACCU-CHEK AVIVA PLUS) w/Device KIT Check blood sugar up to 3 times a day 05/18/18   Lars Mage, MD  calcium-vitamin D (OSCAL WITH D) 500-200 MG-UNIT tablet Take 1 tablet by mouth 2 (two) times daily. 05/12/18   Kathi Ludwig, MD  diphenhydrAMINE (BENADRYL)  25 MG tablet Take 1 tablet (25 mg total) by mouth every 8 (eight) hours as needed for up to 5 days for itching. 05/18/18 05/23/18  Colbert Ewing, MD  ferrous sulfate 325 (65 FE) MG EC tablet Take 1 tablet (325 mg total) by mouth daily with breakfast. 11/11/17   Chundi, Vahini, MD  glucose blood (ACCU-CHEK AVIVA PLUS) test strip USE 3 TO 4 TIMES DAILY TO CHECK BLOOD SUGAR The patient is insulin requiring, ICD 10 code E11.9. The patient tests 4 times per day. 05/12/18   Kathi Ludwig, MD  Insulin Glargine (LANTUS) 100  UNIT/ML Solostar Pen Inject 24 Units into the skin daily at 10 pm. 03/09/18   Lars Mage, MD  Insulin Pen Needle 31G X 5 MM MISC INJECT 24 UNITS INTO THE SKIN AT BEDTIME 05/12/18   Kathi Ludwig, MD  Insulin Syringe-Needle U-100 31G X 15/64" 0.3 ML MISC Use to inject insulin one time daily 08/13/17   Annia Belt, MD  lisinopril-hydrochlorothiazide (PRINZIDE,ZESTORETIC) 20-25 MG tablet Take 1 tablet by mouth daily. 05/12/18   Kathi Ludwig, MD  methocarbamol (ROBAXIN) 500 MG tablet Take 1 tablet (500 mg total) by mouth 2 (two) times daily. 02/13/18   Long, Wonda Olds, MD  omeprazole (PRILOSEC) 40 MG capsule TAKE ONE CAPSULE BY MOUTH ONCE DAILY AS NEEDED Patient taking differently: Take 40 mg by mouth daily as needed (acid reflux).  12/23/17   Chundi, Verne Spurr, MD  ondansetron (ZOFRAN ODT) 4 MG disintegrating tablet Take 1 tablet (4 mg total) by mouth every 8 (eight) hours as needed for nausea or vomiting. 02/10/18   Duffy Bruce, MD  potassium chloride SA (K-DUR,KLOR-CON) 20 MEQ tablet Take 2 tablets (40 mEq total) by mouth 2 (two) times daily for 2 days. 02/10/18 02/13/18  Duffy Bruce, MD  promethazine (PHENERGAN) 25 MG suppository Place 1 suppository (25 mg total) rectally every 6 (six) hours as needed for refractory nausea / vomiting. 02/10/18   Duffy Bruce, MD  sitaGLIPtin-metformin (JANUMET) 50-1000 MG tablet Take 1 tablet by mouth 2 (two) times daily with a meal. 05/12/18 08/10/18  Kathi Ludwig, MD    Family History Family History  Problem Relation Age of Onset  . Diabetes Mother   . Diabetes Father   . Diabetes Sister   . Hypertension Sister   . Diabetes Brother   . Hypertension Brother   . Colon cancer Neg Hx   . Esophageal cancer Neg Hx   . Stomach cancer Neg Hx   . Rectal cancer Neg Hx     Social History Social History   Tobacco Use  . Smoking status: Current Every Day Smoker    Packs/day: 0.01    Types: Cigarettes  . Smokeless tobacco: Never Used    . Tobacco comment: Smoking 3-5 cigs per day; wants to quit  Substance Use Topics  . Alcohol use: Yes    Alcohol/week: 0.6 - 1.2 oz    Types: 1 - 2 Glasses of wine per week    Comment: Occasionally  . Drug use: No     Allergies   Bupropion; Metronidazole; and Shellfish allergy   Review of Systems Review of Systems  All other systems reviewed and are negative.    Physical Exam Updated Vital Signs BP (!) 145/87   Pulse 85   Temp 98.5 F (36.9 C) (Oral)   Resp 18   SpO2 100%   Physical Exam  Constitutional: She is oriented to person, place, and time. She appears well-developed and well-nourished. No distress.  HENT:  Head: Atraumatic.  Edema of lower lip without tongue involvement, no trismus.  Eyes: Conjunctivae are normal.  Neck: Normal range of motion. Neck supple. No tracheal deviation present.  Cardiovascular: Normal rate and regular rhythm.  Pulmonary/Chest: Effort normal and breath sounds normal. She has no wheezes.  Abdominal: Soft. She exhibits no distension. There is no tenderness.  Neurological: She is alert and oriented to person, place, and time.  Skin: Rash (Urticarial rash noted throughout body with some excoriation marks to bilateral forearms but no evidence of infection.) noted.  Psychiatric: She has a normal mood and affect.  Nursing note and vitals reviewed.    ED Treatments / Results  Labs (all labs ordered are listed, but only abnormal results are displayed) Labs Reviewed - No data to display  EKG None  Radiology No results found.  Procedures Procedures (including critical care time)  Medications Ordered in ED Medications - No data to display   Initial Impression / Assessment and Plan / ED Course  I have reviewed the triage vital signs and the nursing notes.  Pertinent labs & imaging results that were available during my care of the patient were reviewed by me and considered in my medical decision making (see chart for  details).     BP (!) 145/87   Pulse 85   Temp 98.5 F (36.9 C) (Oral)   Resp 18   SpO2 100%    Final Clinical Impressions(s) / ED Diagnoses   Final diagnoses:  Angioedema of lips, subsequent encounter  Urticaria    ED Discharge Orders        Ordered    predniSONE (DELTASONE) 20 MG tablet     05/27/18 1253     10:49 AM Patient here with urticaria for the past several weeks suspect related to Wellbutrin.  She has stopped using Wellbutrin for the past 2 to 3 weeks but report minimal improvement of her condition.  No other environmental changes that she can recall.  She does have edema of her lower lip as well as currently been on lisinopril but she has been on that this medication for a prolonged period of time.  Urticaria is presence throughout body without evidence of infection.  Will give Solu-Medrol, and Benadryl here for symptomatic treatment.  12:57 PM Pt felt better.  Recommend discontinuing lisinopril as it may be an offending agent. F/u with allergist.  Return for further care.   Domenic Moras, PA-C 05/27/18 1258    Charlesetta Shanks, MD 05/27/18 1714

## 2018-06-17 NOTE — Progress Notes (Signed)
CC: Diabetes Mellitus Follow up   HPI:  AngieAngie Cain is a 62 y.o. with essential hypertension, Diabetes Mellitus type, iron deficiency anemia, and hyperlipidemia who presented for a diabetes follow up. Please see problem based charting for evaluation, assessment, and plan.  Past Medical History:  Diagnosis Date  . Allergy   . Anxiety    occasional  . Arthritis   . Atherosclerotic peripheral vascular disease with intermittent claudication ([place])    s/p fem-fem bypass 2011. ABI 07/2012 0.5 R 0.65 L  . Diabetes mellitus without complication ([place])   . GERD (gastroesophageal reflux disease)   . Hyperlipidemia   . Hypertension   . Personal history of colonic polyps - adenomas 03/07/2014  . Tobacco abuse    Review of Systems:    Review of Systems  Respiratory: Negative for shortness of breath.   Cardiovascular: Negative for chest pain.  Gastrointestinal: Positive for nausea and vomiting. Negative for abdominal pain.  Neurological: Positive for headaches.   Physical Exam:  Vitals:   06/20/18 1450  BP: (!) 154/77  Pulse: 66  Temp: 98.9 F (37.2 C)  TempSrc: Oral  SpO2: 100%  Weight: 149 lb (67.6 kg)  Height: 5\' 5"  (1.651 m)   Physical Exam  Constitutional: She appears well-developed and well-nourished. No distress.  Cardiovascular: Normal rate, regular rhythm and normal heart sounds.  Pulmonary/Chest: Effort normal and breath sounds normal. No respiratory distress. She has no wheezes.  Neurological: Coordination abnormal.  Skin: She is not diaphoretic.     Assessment & Plan:   See Encounters Tab for problem based charting.  Diabetes Mellitus Type 2  The patient's last a1c=9.9 in March 2019. The patient's home  blood glucose measurements over the past month have ranged 80-220. The patient does/does not note episodes of hypoglycemia. The patient is currently taking janumet 50-1000mg  bid, lantus 24u qhs.  Patient states that she has been taking her Lantus  regularly but she occasionally forgets to take her Janumet.  Assessment and plan The patient's diabetes is not controlled at this time.  Will increase the patient's Lantus from 24 units to 29 units daily.  Continue Janumet 50-thousand milligrams twice daily.    Essential Hypertension  The patient's blood pressure during this visit was 154/77. The patient was seen in ED on 05/27/18 for angioedema and therefore lisinopril was discontinued.   Assessment and plan The patient's blood pressure is not controlled at this time since she has been discontinued off of her blood pressure medication.  Will start amlodipine 5 mg daily and will follow up with the patient in 1 month.  Major Depression The patient's phq9=12.  Patient was previously on bupropion but it was stopped due to concern that it might be possibly causing an allergic reaction/urticaria.  The patient states that her mood continues to be low when she has decreased energy levels.  Assessment and plan The patient continues to have major depressive disorder symptoms.  She is not currently on any pharmacologic agent.  Will start Zoloft 25 mg daily.  Recommended the patient continue counseling and avoid missing any  counseling appointments.    Urticaria The patient has been having pruritus since the beginning of June 2019.  Extensive discussion has been had with the patient regarding lotions, perfumes, assessing whether she has changed any new detergents, soaps that might be contributing to pruritus.  The patient has reviewed all these agents and she continues to have pruritus.  Review of the patient's blood work shows that she does  not have any renal or hepatic dysfunction that  can explain her  pruritus.  Will check TSH as a possible cause of pruritus.  Assessment and plan The patient's pruritus may be  psychosomatic in nature.  Therefore will focus on controlling the patient's depression.  The patient was started on Zoloft 25 mg during this  visit.  The patient was also encouraged to continue using Eucerin cream and Benadryl as needed.  The patient was told to also use calming agents such as [place], [place], Cetaphil.  Healthcare maintenance Mammogram ordered  Patient discussed with Dr. Dareen Piano

## 2018-06-20 ENCOUNTER — Ambulatory Visit: Payer: Medicaid Other | Admitting: Internal Medicine

## 2018-06-20 ENCOUNTER — Other Ambulatory Visit: Payer: Self-pay

## 2018-06-20 ENCOUNTER — Encounter (INDEPENDENT_AMBULATORY_CARE_PROVIDER_SITE_OTHER): Payer: Self-pay

## 2018-06-20 ENCOUNTER — Encounter: Payer: Self-pay | Admitting: Internal Medicine

## 2018-06-20 VITALS — BP 154/77 | HR 66 | Temp 98.9°F | Ht 65.0 in | Wt 149.0 lb

## 2018-06-20 DIAGNOSIS — E785 Hyperlipidemia, unspecified: Secondary | ICD-10-CM | POA: Diagnosis not present

## 2018-06-20 DIAGNOSIS — F339 Major depressive disorder, recurrent, unspecified: Secondary | ICD-10-CM

## 2018-06-20 DIAGNOSIS — Z0000 Encounter for general adult medical examination without abnormal findings: Secondary | ICD-10-CM

## 2018-06-20 DIAGNOSIS — E1165 Type 2 diabetes mellitus with hyperglycemia: Secondary | ICD-10-CM | POA: Diagnosis not present

## 2018-06-20 DIAGNOSIS — D509 Iron deficiency anemia, unspecified: Secondary | ICD-10-CM

## 2018-06-20 DIAGNOSIS — E118 Type 2 diabetes mellitus with unspecified complications: Secondary | ICD-10-CM

## 2018-06-20 DIAGNOSIS — E1169 Type 2 diabetes mellitus with other specified complication: Secondary | ICD-10-CM

## 2018-06-20 DIAGNOSIS — F320 Major depressive disorder, single episode, mild: Secondary | ICD-10-CM

## 2018-06-20 DIAGNOSIS — I10 Essential (primary) hypertension: Secondary | ICD-10-CM

## 2018-06-20 DIAGNOSIS — L509 Urticaria, unspecified: Secondary | ICD-10-CM | POA: Diagnosis present

## 2018-06-20 DIAGNOSIS — R51 Headache: Secondary | ICD-10-CM

## 2018-06-20 DIAGNOSIS — Z79899 Other long term (current) drug therapy: Secondary | ICD-10-CM

## 2018-06-20 DIAGNOSIS — IMO0002 Reserved for concepts with insufficient information to code with codable children: Secondary | ICD-10-CM

## 2018-06-20 DIAGNOSIS — Z794 Long term (current) use of insulin: Secondary | ICD-10-CM | POA: Diagnosis not present

## 2018-06-20 DIAGNOSIS — R112 Nausea with vomiting, unspecified: Secondary | ICD-10-CM

## 2018-06-20 LAB — POCT GLYCOSYLATED HEMOGLOBIN (HGB A1C): Hemoglobin A1C: 9 % — AB (ref 4.0–5.6)

## 2018-06-20 LAB — GLUCOSE, CAPILLARY: Glucose-Capillary: 146 mg/dL — ABNORMAL HIGH (ref 70–99)

## 2018-06-20 MED ORDER — SERTRALINE HCL 25 MG PO TABS: 25.0000 mg | ORAL_TABLET | Freq: Every day | ORAL | 1 refills | Status: DC

## 2018-06-20 MED ORDER — ATORVASTATIN CALCIUM 40 MG PO TABS: 40.0000 mg | ORAL_TABLET | Freq: Every day | ORAL | 11 refills | Status: DC

## 2018-06-20 MED ORDER — INSULIN GLARGINE 100 UNIT/ML SOLOSTAR PEN: 29.0000 [IU] | PEN_INJECTOR | Freq: Every day | SUBCUTANEOUS | 2 refills | Status: DC

## 2018-06-20 MED ORDER — AMLODIPINE BESYLATE 5 MG PO TABS: 5.0000 mg | ORAL_TABLET | Freq: Every day | ORAL | 1 refills | Status: DC

## 2018-06-20 NOTE — Assessment & Plan Note (Signed)
The patient's blood pressure during this visit was 154/77. The patient was seen in ED on 05/27/18 for angioedema and therefore lisinopril was discontinued.   Assessment and plan The patient's blood pressure is not controlled at this time since she has been discontinued off of her blood pressure medication.  Will start amlodipine 5 mg daily and will follow up with the patient in 1 month.

## 2018-06-20 NOTE — Patient Instructions (Signed)
It was a pleasure to see you today [name]. Please make the following changes:  -Please increase your lantus from 24u to 29u daily at bedtime -Please start taking zoloft 35mg  daily -Please start taking amlodpine 5mg  daily -Please continue to use eucerin and use dove, olay or cetaphil products -Please follow up in 1 month  If you have any questions or concerns, please call our clinic at [phone] between 9am-5pm and after hours call [phone] and ask for the internal medicine resident on call. If you feel you are having a medical emergency please call 911.   Thank you, we look forward to help you remain healthy!  Angie Mage, MD Internal Medicine PGY1

## 2018-06-20 NOTE — Assessment & Plan Note (Signed)
The patient's last a1c=9.9 in March 2019. The patient's home  blood glucose measurements over the past month have ranged 80-220. The patient does/does not note episodes of hypoglycemia. The patient is currently taking janumet 50-1000mg  bid, lantus 24u qhs.  Patient states that she has been taking her Lantus regularly but she occasionally forgets to take her Janumet.  Assessment and plan The patient's diabetes is not controlled at this time.  Will increase the patient's Lantus from 24 units to 29 units daily.  Continue Janumet 50-thousand milligrams twice daily.

## 2018-06-20 NOTE — Assessment & Plan Note (Signed)
The patient has been having pruritus since the beginning of June 2019.  Extensive discussion has been had with the patient regarding lotions, perfumes, assessing whether she has changed any new detergents, soaps that might be contributing to pruritus.  The patient has reviewed all these agents and she continues to have pruritus.  Review of the patient's blood work shows that she does not have any renal or hepatic dysfunction that  can explain her  pruritus.  Will check TSH as a possible cause of pruritus.  Assessment and plan The patient's pruritus may be  psychosomatic in nature.  Therefore will focus on controlling the patient's depression.  The patient was started on Zoloft 25 mg during this visit.  The patient was also encouraged to continue using Eucerin cream and Benadryl as needed.  The patient was told to also use calming agents such as [place], [place], Cetaphil.

## 2018-06-20 NOTE — Assessment & Plan Note (Signed)
The patient's phq9=12.  Patient was previously on bupropion but it was stopped due to concern that it might be possibly causing an allergic reaction/urticaria.  The patient states that her mood continues to be low when she has decreased energy levels.  Assessment and plan The patient continues to have major depressive disorder symptoms.  She is not currently on any pharmacologic agent.  Will start Zoloft 25 mg daily.  Recommended the patient continue counseling and avoid missing any  counseling appointments.

## 2018-06-20 NOTE — Assessment & Plan Note (Signed)
Mammogram ordered

## 2018-06-21 LAB — TSH: TSH: 1.2 u[IU]/mL (ref 0.450–4.500)

## 2018-06-22 NOTE — Progress Notes (Signed)
Internal Medicine Clinic Attending  Case discussed with Dr. Chundi at the time of the visit.  We reviewed the resident's history and exam and pertinent patient test results.  I agree with the assessment, diagnosis, and plan of care documented in the resident's note. 

## 2018-06-24 ENCOUNTER — Encounter: Payer: Medicaid Other | Admitting: Internal Medicine

## 2018-07-22 ENCOUNTER — Ambulatory Visit: Payer: Medicaid Other | Admitting: Internal Medicine

## 2018-07-22 VITALS — BP 121/68 | HR 70 | Temp 99.2°F | Ht 65.0 in | Wt 147.7 lb

## 2018-07-22 DIAGNOSIS — E1165 Type 2 diabetes mellitus with hyperglycemia: Secondary | ICD-10-CM

## 2018-07-22 DIAGNOSIS — Z0000 Encounter for general adult medical examination without abnormal findings: Secondary | ICD-10-CM

## 2018-07-22 DIAGNOSIS — F320 Major depressive disorder, single episode, mild: Secondary | ICD-10-CM

## 2018-07-22 DIAGNOSIS — IMO0002 Reserved for concepts with insufficient information to code with codable children: Secondary | ICD-10-CM

## 2018-07-22 DIAGNOSIS — F329 Major depressive disorder, single episode, unspecified: Secondary | ICD-10-CM

## 2018-07-22 DIAGNOSIS — Z7982 Long term (current) use of aspirin: Secondary | ICD-10-CM | POA: Diagnosis not present

## 2018-07-22 DIAGNOSIS — Z79899 Other long term (current) drug therapy: Secondary | ICD-10-CM

## 2018-07-22 DIAGNOSIS — Z794 Long term (current) use of insulin: Secondary | ICD-10-CM

## 2018-07-22 DIAGNOSIS — Z86010 Personal history of colonic polyps: Secondary | ICD-10-CM | POA: Diagnosis not present

## 2018-07-22 DIAGNOSIS — K219 Gastro-esophageal reflux disease without esophagitis: Secondary | ICD-10-CM | POA: Diagnosis not present

## 2018-07-22 DIAGNOSIS — I10 Essential (primary) hypertension: Secondary | ICD-10-CM

## 2018-07-22 DIAGNOSIS — E119 Type 2 diabetes mellitus without complications: Secondary | ICD-10-CM

## 2018-07-22 DIAGNOSIS — D509 Iron deficiency anemia, unspecified: Secondary | ICD-10-CM

## 2018-07-22 NOTE — Assessment & Plan Note (Signed)
Patient's last A1c was 9 in July 2019.  The patient is currently taking Janumet 50-1000mg  twice daily, Lantus 29 units nightly.  Patient states that her blood glucose has been ranging 100-150s.  She did not bring her meter readings in today.  She is also on aspirin 81 mg and atorvastatin 40 mg.  Her renal function continues to be normal.   Assessment and plan The patient's blood glucose readings do not correlate with her recent A1c 1 month ago.  Recommended the patient collect blood glucose readings both before and after meals and call me if her readings are greater than 250.  At that time I will adjust her medication.

## 2018-07-22 NOTE — Assessment & Plan Note (Signed)
The patient states that her mood is pretty stable. Her PHQ9=3. She has been missing some doses of sertraline.    Assessment and plan Encouraged the patient to continue taking sertraline 25mg  qd.

## 2018-07-22 NOTE — Assessment & Plan Note (Signed)
Patient's last iron check level was checked in 2018 during which time it was 9.  The patient was started on iron supplementation at that time and recommended for repeat colonoscopy.  The patient's colonoscopy showed 3 sessile polyps in the ascending colon that were removed, however the recommendation date as to when to do follow up colonoscopy based on biopsy result was not determined.   Assessment and plan  Will contact patient's gastroenterologist to find out when follow-up colonoscopy should be -Ordered iron, CBC, ferritin to evaluate the patient's iron levels are continuing to be low

## 2018-07-22 NOTE — Progress Notes (Signed)
CC: Diabetes mellitus follow-up  HPI:  Angie Cain is a 62 y.o. with essential hypertension, insulin-dependent diabetes mellitus, GERD, iron deficiency anemia, major depression who presents for diabetes follow-up. Please see problem based charting for evaluation, assessment, and plan.  Past Medical History:  Diagnosis Date  . Allergy   . Anxiety    occasional  . Arthritis   . Atherosclerotic peripheral vascular disease with intermittent claudication ([place])    s/p fem-fem bypass 2011. ABI 07/2012 0.5 R 0.65 L  . Diabetes mellitus without complication ([place])   . GERD (gastroesophageal reflux disease)   . Hyperlipidemia   . Hypertension   . Personal history of colonic polyps - adenomas 03/07/2014  . Tobacco abuse    Review of Systems:    Has pruritus Denies chest pain, shortness of breath, dizziness  Physical Exam:  Vitals:   07/22/18 1543  BP: 121/68  Pulse: 70  Temp: 99.2 F (37.3 C)  TempSrc: Oral  SpO2: 100%  Weight: 147 lb 11.2 oz (67 kg)  Height: 5\' 5"  (1.651 m)   Physical Exam  Constitutional: She appears well-developed and well-nourished. No distress.  HENT:  Head: Normocephalic and atraumatic.  Eyes: Pupils are equal, round, and reactive to light. Conjunctivae are normal.  Cardiovascular: Normal rate, regular rhythm and normal heart sounds.  Pulmonary/Chest: Effort normal and breath sounds normal. No stridor. No respiratory distress.  Abdominal: Soft. Bowel sounds are normal. She exhibits no distension. There is no tenderness.  Musculoskeletal: She exhibits no edema.  Neurological: She is alert.  Skin: She is not diaphoretic.  Psychiatric: She has a normal mood and affect. Her behavior is normal. Judgment and thought content normal.    Assessment & Plan:   See Encounters Tab for problem based charting.  Diabetes mellitus follow-up Patient's last A1c was 9 in July 2019.  The patient is currently taking Janumet 50-1000mg  twice daily, Lantus 29  units nightly.  Patient states that her blood glucose has been ranging 100-150s.  She did not bring her meter readings in today.  She is also on aspirin 81 mg and atorvastatin 40 mg.  Her renal function continues to be normal.   Assessment and plan The patient's blood glucose readings do not correlate with her recent A1c 1 month ago.  Recommended the patient collect blood glucose readings both before and after meals and call me if her readings are greater than 250.  At that time I will adjust her medication.  Major Depressive Disorder The patient states that her mood is pretty stable. Her PHQ9=3. She has been missing some doses of sertraline.    Assessment and plan Encouraged the patient to continue taking sertraline 25mg  qd.  Hypertension The patient's blood pressure during this visit was 121/68.  Patient is currently taking amlodipine 5 mg daily.  ACE inhibitor even though she has concomittant diabetes because she developed angioedema from using it.   Assessment and plan The patient's blood pressure is well controlled and therefore we will continue her on amlodipine 5 mg daily.  Iron deficiency anemia Patient's last iron check level was checked in 2018 during which time it was 9.  The patient was started on iron supplementation at that time and recommended for repeat colonoscopy.  The patient's colonoscopy showed 3 sessile polyps in the ascending colon that were removed, however the recommendation date as to when to do follow up colonoscopy based on biopsy result was not determined.   Assessment and plan  Will contact patient's gastroenterologist to  find out when follow-up colonoscopy should be -Ordered iron, CBC, ferritin to evaluate the patient's iron levels are continuing to be low  Health maintenance Bone density    Patient discussed with Dr. Daryll Drown

## 2018-07-22 NOTE — Assessment & Plan Note (Signed)
Bone density

## 2018-07-22 NOTE — Patient Instructions (Addendum)
It was a pleasure to see you today Ms. Stay. Your blood pressure and diabetes appear to be well controlled. Please continue your current medications. Please record your blood glucose readings both before and after meals and call me if they are above >250 consistently. Thank you!   If you have any questions or concerns, please call our clinic at [phone] between 9am-5pm and after hours call [phone] and ask for the internal medicine resident on call. If you feel you are having a medical emergency please call 911.   Thank you, we look forward to help you remain healthy!  Lars Mage, MD Internal Medicine PGY2

## 2018-07-22 NOTE — Assessment & Plan Note (Signed)
The patient's blood pressure during this visit was 121/68.  Patient is currently taking amlodipine 5 mg daily.  ACE inhibitor even though she has concomittant diabetes because she developed angioedema from using it.   Assessment and plan The patient's blood pressure is well controlled and therefore we will continue her on amlodipine 5 mg daily.

## 2018-07-23 LAB — CBC
Hematocrit: 33.6 % — ABNORMAL LOW (ref 34.0–46.6)
Hemoglobin: 10.4 g/dL — ABNORMAL LOW (ref 11.1–15.9)
MCH: 22.8 pg — AB (ref 26.6–33.0)
MCHC: 31 g/dL — AB (ref 31.5–35.7)
MCV: 74 fL — ABNORMAL LOW (ref 79–97)
PLATELETS: 501 10*3/uL — AB (ref 150–450)
RBC: 4.57 x10E6/uL (ref 3.77–5.28)
RDW: 16.2 % — AB (ref 12.3–15.4)
WBC: 7.7 10*3/uL (ref 3.4–10.8)

## 2018-07-23 LAB — FERRITIN: FERRITIN: 10 ng/mL — AB (ref 15–150)

## 2018-07-23 LAB — IRON: Iron: 18 ug/dL — ABNORMAL LOW (ref 27–139)

## 2018-07-25 NOTE — Progress Notes (Signed)
Internal Medicine Clinic Attending  Case discussed with Dr. Chundi at the time of the visit.  We reviewed the resident's history and exam and pertinent patient test results.  I agree with the assessment, diagnosis, and plan of care documented in the resident's note. 

## 2018-08-16 ENCOUNTER — Other Ambulatory Visit: Payer: Self-pay | Admitting: Oncology

## 2018-08-16 ENCOUNTER — Other Ambulatory Visit: Payer: Self-pay | Admitting: Internal Medicine

## 2018-08-16 DIAGNOSIS — I10 Essential (primary) hypertension: Secondary | ICD-10-CM

## 2018-08-24 ENCOUNTER — Ambulatory Visit: Payer: Medicaid Other

## 2018-09-30 ENCOUNTER — Other Ambulatory Visit: Payer: Self-pay | Admitting: Internal Medicine

## 2018-12-24 ENCOUNTER — Emergency Department (HOSPITAL_COMMUNITY): Payer: Medicaid Other

## 2018-12-24 ENCOUNTER — Encounter (HOSPITAL_COMMUNITY): Payer: Self-pay | Admitting: Emergency Medicine

## 2018-12-24 ENCOUNTER — Emergency Department (HOSPITAL_COMMUNITY)
Admission: EM | Admit: 2018-12-24 | Discharge: 2018-12-24 | Disposition: A | Discharge status: Home / Self Care | Payer: Medicaid Other | Attending: Emergency Medicine | Admitting: Emergency Medicine

## 2018-12-24 ENCOUNTER — Other Ambulatory Visit: Payer: Self-pay

## 2018-12-24 DIAGNOSIS — F17210 Nicotine dependence, cigarettes, uncomplicated: Secondary | ICD-10-CM | POA: Insufficient documentation

## 2018-12-24 DIAGNOSIS — I10 Essential (primary) hypertension: Secondary | ICD-10-CM | POA: Insufficient documentation

## 2018-12-24 DIAGNOSIS — Z7982 Long term (current) use of aspirin: Secondary | ICD-10-CM | POA: Insufficient documentation

## 2018-12-24 DIAGNOSIS — Z794 Long term (current) use of insulin: Secondary | ICD-10-CM | POA: Insufficient documentation

## 2018-12-24 DIAGNOSIS — E119 Type 2 diabetes mellitus without complications: Secondary | ICD-10-CM | POA: Insufficient documentation

## 2018-12-24 DIAGNOSIS — R109 Unspecified abdominal pain: Secondary | ICD-10-CM | POA: Diagnosis present

## 2018-12-24 DIAGNOSIS — Z79899 Other long term (current) drug therapy: Secondary | ICD-10-CM | POA: Insufficient documentation

## 2018-12-24 DIAGNOSIS — N3000 Acute cystitis without hematuria: Secondary | ICD-10-CM | POA: Insufficient documentation

## 2018-12-24 LAB — CBC
HCT: 33.8 % — ABNORMAL LOW (ref 36.0–46.0)
Hemoglobin: 9.6 g/dL — ABNORMAL LOW (ref 12.0–15.0)
MCH: 19.7 pg — ABNORMAL LOW (ref 26.0–34.0)
MCHC: 28.4 g/dL — ABNORMAL LOW (ref 30.0–36.0)
MCV: 69.3 fL — ABNORMAL LOW (ref 80.0–100.0)
PLATELETS: 426 10*3/uL — AB (ref 150–400)
RBC: 4.88 MIL/uL (ref 3.87–5.11)
RDW: 18.1 % — AB (ref 11.5–15.5)
WBC: 7 10*3/uL (ref 4.0–10.5)
nRBC: 0 % (ref 0.0–0.2)

## 2018-12-24 LAB — COMPREHENSIVE METABOLIC PANEL
ALBUMIN: 3.9 g/dL (ref 3.5–5.0)
ALT: 7 U/L (ref 0–44)
ANION GAP: 9 (ref 5–15)
AST: 12 U/L — AB (ref 15–41)
Alkaline Phosphatase: 66 U/L (ref 38–126)
BUN: 6 mg/dL — AB (ref 8–23)
CO2: 26 mmol/L (ref 22–32)
Calcium: 8.4 mg/dL — ABNORMAL LOW (ref 8.9–10.3)
Chloride: 105 mmol/L (ref 98–111)
Creatinine, Ser: 0.54 mg/dL (ref 0.44–1.00)
GFR calc Af Amer: >60 mL/min (ref >60)
GFR calc non Af Amer: >60 mL/min (ref >60)
GLUCOSE: 142 mg/dL — AB (ref 70–99)
POTASSIUM: 3 mmol/L — AB (ref 3.5–5.1)
SODIUM: 140 mmol/L (ref 135–145)
Total Bilirubin: 0.5 mg/dL (ref 0.3–1.2)
Total Protein: 7.4 g/dL (ref 6.5–8.1)

## 2018-12-24 LAB — URINALYSIS, ROUTINE W REFLEX MICROSCOPIC
Bilirubin Urine: NEGATIVE
Glucose, UA: 50 mg/dL — AB
Hgb urine dipstick: NEGATIVE
Ketones, ur: NEGATIVE mg/dL
Leukocytes, UA: NEGATIVE
Nitrite: NEGATIVE
Protein, ur: 30 mg/dL — AB
Specific Gravity, Urine: 1.01 (ref 1.005–1.030)
pH: 9 — ABNORMAL HIGH (ref 5.0–8.0)

## 2018-12-24 LAB — TROPONIN I: Troponin I: <0.03 ng/mL (ref <0.03)

## 2018-12-24 LAB — LIPASE, BLOOD: Lipase: 23 U/L (ref 11–51)

## 2018-12-24 MED ORDER — ONDANSETRON HCL 4 MG/2ML IJ SOLN
4.0000 mg | Freq: Once | INTRAMUSCULAR | Status: AC
  Administered 2018-12-24: 4 mg via INTRAVENOUS
  Filled 2018-12-24: qty 2

## 2018-12-24 MED ORDER — IOPAMIDOL (ISOVUE-370) INJECTION 76%
100.0000 mL | Freq: Once | INTRAVENOUS | Status: AC | PRN
  Administered 2018-12-24: 100 mL via INTRAVENOUS

## 2018-12-24 MED ORDER — IOPAMIDOL (ISOVUE-300) INJECTION 61%
INTRAVENOUS | Status: AC
  Filled 2018-12-24: qty 100

## 2018-12-24 MED ORDER — IOPAMIDOL (ISOVUE-370) INJECTION 76%
INTRAVENOUS | Status: AC
  Filled 2018-12-24: qty 100

## 2018-12-24 MED ORDER — SODIUM CHLORIDE 0.9 % IV SOLN
1.0000 g | Freq: Once | INTRAVENOUS | Status: AC
  Administered 2018-12-24: 1 g via INTRAVENOUS
  Filled 2018-12-24: qty 10

## 2018-12-24 MED ORDER — FENTANYL CITRATE (PF) 100 MCG/2ML IJ SOLN
50.0000 ug | Freq: Once | INTRAMUSCULAR | Status: AC
  Administered 2018-12-24: 50 ug via INTRAVENOUS
  Filled 2018-12-24: qty 2

## 2018-12-24 MED ORDER — PHENAZOPYRIDINE HCL 95 MG PO TABS: 95.0000 mg | ORAL_TABLET | Freq: Three times a day (TID) | ORAL | 0 refills | Status: DC | PRN

## 2018-12-24 MED ORDER — CEPHALEXIN 500 MG PO CAPS: 500.0000 mg | ORAL_CAPSULE | Freq: Two times a day (BID) | ORAL | 0 refills | Status: DC

## 2018-12-24 MED ORDER — SODIUM CHLORIDE (PF) 0.9 % IJ SOLN
INTRAMUSCULAR | Status: AC
  Filled 2018-12-24: qty 50

## 2018-12-24 NOTE — ED Provider Notes (Signed)
Received patient at signout from [facility].  Refer to provider note for full history and physical examination.  Briefly, patient is a 63 year old female brought in for evaluation of severe lower abdominal pain.  Imaging and lab work reassuring.  Pending UA and reassessment.   MDM  UA suggest UTI.  Will culture.  Patient resting comfortably in no apparent distress on reevaluation.  She feels comfortable with discharge home.  Discussed strict ED return precautions.  Patient and husband verbalized understanding of and agreement with plan and patient stable for discharge home at this time.       Renita Papa, PA-C 12/24/18 1445    Daleen Bo, MD 12/24/18 1724

## 2018-12-24 NOTE — ED Provider Notes (Signed)
[facility] DEPT Provider Note   CSN: [id number] Arrival date & time: 12/24/18  0326     History   Chief Complaint Chief Complaint  Patient presents with  . Abdominal Pain    possible bowel obstruction    HPI Angie Cain is a 63 y.o. female with a past medical history of diabetes, tobacco abuse, peripheral vascular disease who is status post femorofemoral bypass brought in by her husband for severe abdominal pain.  This was given by the patient's husband because she is crying.  He states that she has had lower abdominal pain for a year but has refused to see a doctor about it. He states that usually she would get in the tub and get some relief. She is complaining pain in her lower abdomen below her surgical scar incision.  She has not had a bowel movement in 2 days.  She has nausea without vomiting.  No diarrhea.  Patient denies leg pain or chest pain  HPI  Past Medical History:  Diagnosis Date  . Allergy   . Anxiety    occasional  . Arthritis   . Atherosclerotic peripheral vascular disease with intermittent claudication ([place])    s/p fem-fem bypass 2011. ABI 07/2012 0.5 R 0.65 L  . Diabetes mellitus without complication ([place])   . GERD (gastroesophageal reflux disease)   . Hyperlipidemia   . Hypertension   . Personal history of colonic polyps - adenomas 03/07/2014  . Tobacco abuse     Patient Active Problem List   Diagnosis Date Noted  . Hives 05/15/2018  . Acute renal failure ([place]) 02/21/2018  . Abdominal pain 02/19/2018  . Allergic rhinitis 10/16/2017  . Major depression 10/16/2017  . Iron deficiency anemia 05/08/2017  . Healthcare maintenance 03/18/2017  . Eye discharge 07/31/2016  . Rotator cuff impingement syndrome of left shoulder 03/23/2016  . Osteopenia determined by x-ray 01/25/2015  . Bilateral plantar fasciitis 09/30/2014  . Hx of adenomatous colonic polyps 03/07/2014  . Low back pain 12/18/2013  . GERD (gastroesophageal  reflux disease) 06/06/2013  . Hyperlipidemia associated with type 2 diabetes mellitus ([place]) 06/06/2013  . Tobacco use disorder 07/21/2007  . Essential hypertension 07/21/2007  . IDDM (insulin dependent diabetes mellitus) ([place]) 07/21/2007  . Insulin dependent type 2 diabetes mellitus, uncontrolled ([place]) 07/20/1993    Past Surgical History:  Procedure Laterality Date  . VAGINAL HYSTERECTOMY    . 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      Home Medications    Prior to Admission medications   Medication Sig Start Date End Date Taking? Authorizing Provider  ACCU-CHEK SOFTCLIX LANCETS lancets Use to check blood sugar up to 3 times a day 03/10/18   Lars Mage, MD  amLODipine (NORVASC) 5 MG tablet Take 1 tablet (5 mg total) by mouth daily. 08/16/18 11/14/18  Lars Mage, MD  aspirin EC 81 MG tablet Take 81 mg by mouth daily.    [provider]  atorvastatin (LIPITOR) 40 MG tablet Take 1 tablet (40 mg total) by mouth daily. 06/20/18   Chundi, Verne Spurr, MD  Blood Glucose Monitoring Suppl (ACCU-CHEK AVIVA PLUS) w/Device KIT Check blood sugar up to 3 times a day 05/18/18   Lars Mage, MD  calcium-vitamin D (OSCAL WITH D) 500-200 MG-UNIT tablet Take 1 tablet by mouth 2 (two) times daily. 05/12/18   Kathi Ludwig, MD  diphenhydrAMINE (BENADRYL) 25  MG tablet Take 1 tablet (25 mg total) by mouth every 8 (eight) hours as needed for up to 5 days for itching. 05/18/18 05/23/18  Colbert Ewing, MD  ferrous sulfate 325 (65 FE) MG EC tablet Take 1 tablet (325 mg total) by mouth daily with breakfast. 11/11/17   Chundi, Vahini, MD  glucose blood (ACCU-CHEK AVIVA PLUS) test strip USE 3 TO 4 TIMES DAILY TO CHECK BLOOD SUGAR The patient is insulin requiring, ICD 10 code E11.9. The patient tests 4 times per day. 05/12/18   Kathi Ludwig, MD  Insulin Pen Needle 31G X 5 MM MISC INJECT 24 UNITS INTO THE SKIN AT BEDTIME 05/12/18    Kathi Ludwig, MD  Insulin Syringe-Needle U-100 31G X 15/64" 0.3 ML MISC Use to inject insulin one time daily 08/13/17   Annia Belt, MD  LANTUS SOLOSTAR 100 UNIT/ML Solostar Pen INJECT 24 UNITS INTO THE SKIN AT 10:00PM EVERY EVENING 09/30/18   Chundi, Verne Spurr, MD  omeprazole (PRILOSEC) 40 MG capsule TAKE ONE CAPSULE BY MOUTH ONCE DAILY AS NEEDED Patient taking differently: Take 40 mg by mouth daily as needed (acid reflux).  12/23/17   Chundi, Verne Spurr, MD  sertraline (ZOLOFT) 25 MG tablet Take 1 tablet (25 mg total) by mouth daily. 06/20/18 07/20/18  Lars Mage, MD  sitaGLIPtin-metformin (JANUMET) 50-1000 MG tablet Take 1 tablet by mouth 2 (two) times daily with a meal. 05/12/18 08/10/18  Kathi Ludwig, MD    Family History Family History  Problem Relation Age of Onset  . Diabetes Mother   . Diabetes Father   . Diabetes Sister   . Hypertension Sister   . Diabetes Brother   . Hypertension Brother   . Colon cancer Neg Hx   . Esophageal cancer Neg Hx   . Stomach cancer Neg Hx   . Rectal cancer Neg Hx     Social History Social History   Tobacco Use  . Smoking status: Current Every Day Smoker    Packs/day: 0.10    Types: Cigarettes  . Smokeless tobacco: Never Used  . Tobacco comment: Smokes 1-2 cigs/day.  Substance Use Topics  . Alcohol use: Yes    Alcohol/week: 1.0 - 2.0 standard drinks    Types: 1 - 2 Glasses of wine per week    Comment: Occasionally  . Drug use: No     Allergies   Bupropion; Metronidazole; and Shellfish allergy   Review of Systems Review of Systems Ten systems reviewed and are negative for acute change, except as noted in the HPI.    Physical Exam Updated Vital Signs BP (!) 143/67 (BP Location: Right Arm)   Pulse 62   Temp 98.6 F (37 C) (Oral)   Resp 18   Ht '5\' 5"'  (1.651 m)   Wt 68 kg   SpO2 99%   BMI 24.96 kg/m   Physical Exam Vitals signs and nursing note reviewed.  Constitutional:      General: She is not in acute  distress.    Appearance: She is well-developed. She is not ill-appearing, toxic-appearing or diaphoretic.  HENT:     Head: Normocephalic and atraumatic.  Eyes:     General: No scleral icterus.    Conjunctiva/sclera: Conjunctivae normal.  Neck:     Musculoskeletal: Normal range of motion.  Cardiovascular:     Rate and Rhythm: Normal rate and regular rhythm.     Heart sounds: Normal heart sounds. No murmur. No friction rub. No gallop.      Comments: Normal DP  PT pulses bilaterally Pulmonary:     Effort: Pulmonary effort is normal. No respiratory distress.     Breath sounds: Normal breath sounds.  Abdominal:     General: Bowel sounds are normal. There is no distension.     Palpations: Abdomen is soft. There is no mass.     Tenderness: There is generalized abdominal tenderness. There is no right CVA tenderness, left CVA tenderness or guarding.  Skin:    General: Skin is warm and dry.  Neurological:     Mental Status: She is alert and oriented to person, place, and time.  Psychiatric:        Behavior: Behavior normal.      ED Treatments / Results  Labs (all labs ordered are listed, but only abnormal results are displayed) Labs Reviewed  CBC - Abnormal; Notable for the following components:      Result Value   Hemoglobin 9.6 (*)    HCT 33.8 (*)    MCV 69.3 (*)    MCH 19.7 (*)    MCHC 28.4 (*)    RDW 18.1 (*)    Platelets 426 (*)    All other components within normal limits  URINALYSIS, ROUTINE W REFLEX MICROSCOPIC - Abnormal; Notable for the following components:   APPearance CLOUDY (*)    pH 9.0 (*)    Glucose, UA 50 (*)    Protein, ur 30 (*)    Bacteria, UA MANY (*)    All other components within normal limits  LIPASE, BLOOD  COMPREHENSIVE METABOLIC PANEL    EKG None  Radiology No results found.  Procedures Procedures (including critical care time)  Medications Ordered in ED Medications - No data to display   Initial Impression / Assessment and Plan /  ED Course  I have reviewed the triage vital signs and the nursing notes.  Pertinent labs & imaging results that were available during my care of the patient were reviewed by me and considered in my medical decision making (see chart for details).     Patient with urinary tract infection.  P.o. challenge.  Expect discharge.  Signout given to [facility]  Final Clinical Impressions(s) / ED Diagnoses   Final diagnoses:  Acute cystitis without hematuria    ED Discharge Orders    None       Margarita Mail, PA-C 12/29/18 0602    Palumbo, April, MD 12/31/18 2322

## 2018-12-24 NOTE — ED Notes (Signed)
Will obtain EKG once pt returns from imaging.

## 2018-12-24 NOTE — Discharge Instructions (Signed)
1. Medications: Please take all of your antibiotics until finished!   You may develop abdominal discomfort or diarrhea from the antibiotic.  You may help offset this with probiotics which you can buy or get in yogurt. Do not eat  or take the probiotics until 2 hours after your antibiotic.  Take Azo as needed for pain with urination and lower abdominal pain. 2. Treatment: rest, drink plenty of fluids, take medications as prescribed 3. Follow Up: Please followup with your primary doctor in 3 days for discussion of your diagnoses and further evaluation after today's visit; if you do not have a primary care doctor use the resource guide provided to find one; return to the ER for fevers, persistent vomiting, worsening abdominal pain or other concerning symptoms.

## 2018-12-24 NOTE — ED Triage Notes (Signed)
Patient presents with N/V and no bowel movement x2 days. Patient stating that she is not belching or passing gas. Patient states pain low, mid abd pain.

## 2018-12-26 ENCOUNTER — Emergency Department (HOSPITAL_COMMUNITY)
Admission: EM | Admit: 2018-12-26 | Discharge: 2018-12-26 | Disposition: A | Discharge status: Home / Self Care | Payer: Medicaid Other | Attending: Emergency Medicine | Admitting: Emergency Medicine

## 2018-12-26 ENCOUNTER — Encounter (HOSPITAL_COMMUNITY): Payer: Self-pay

## 2018-12-26 ENCOUNTER — Other Ambulatory Visit: Payer: Self-pay

## 2018-12-26 DIAGNOSIS — R1030 Lower abdominal pain, unspecified: Secondary | ICD-10-CM | POA: Insufficient documentation

## 2018-12-26 DIAGNOSIS — E119 Type 2 diabetes mellitus without complications: Secondary | ICD-10-CM | POA: Insufficient documentation

## 2018-12-26 DIAGNOSIS — Z79899 Other long term (current) drug therapy: Secondary | ICD-10-CM | POA: Insufficient documentation

## 2018-12-26 DIAGNOSIS — I10 Essential (primary) hypertension: Secondary | ICD-10-CM | POA: Insufficient documentation

## 2018-12-26 DIAGNOSIS — F17210 Nicotine dependence, cigarettes, uncomplicated: Secondary | ICD-10-CM | POA: Diagnosis not present

## 2018-12-26 DIAGNOSIS — Z7982 Long term (current) use of aspirin: Secondary | ICD-10-CM | POA: Diagnosis not present

## 2018-12-26 DIAGNOSIS — R102 Pelvic and perineal pain: Secondary | ICD-10-CM

## 2018-12-26 DIAGNOSIS — E876 Hypokalemia: Secondary | ICD-10-CM | POA: Insufficient documentation

## 2018-12-26 LAB — BASIC METABOLIC PANEL
Anion gap: 8 (ref 5–15)
BUN: 16 mg/dL (ref 8–23)
CO2: 30 mmol/L (ref 22–32)
Calcium: 8.6 mg/dL — ABNORMAL LOW (ref 8.9–10.3)
Chloride: 101 mmol/L (ref 98–111)
Creatinine, Ser: 0.73 mg/dL (ref 0.44–1.00)
GFR calc Af Amer: >60 mL/min (ref >60)
GFR calc non Af Amer: >60 mL/min (ref >60)
Glucose, Bld: 192 mg/dL — ABNORMAL HIGH (ref 70–99)
Potassium: 2.7 mmol/L — CL (ref 3.5–5.1)
SODIUM: 139 mmol/L (ref 135–145)

## 2018-12-26 LAB — URINALYSIS, ROUTINE W REFLEX MICROSCOPIC
BILIRUBIN URINE: NEGATIVE
Bacteria, UA: NONE SEEN
Glucose, UA: 50 mg/dL — AB
Hgb urine dipstick: NEGATIVE
Ketones, ur: NEGATIVE mg/dL
Leukocytes, UA: NEGATIVE
Nitrite: POSITIVE — AB
Protein, ur: 100 mg/dL — AB
Specific Gravity, Urine: 1.025 (ref 1.005–1.030)
pH: 5 (ref 5.0–8.0)

## 2018-12-26 LAB — CBC WITH DIFFERENTIAL/PLATELET
Abs Immature Granulocytes: 0.02 10*3/uL (ref 0.00–0.07)
BASOS PCT: 0 %
Basophils Absolute: 0 10*3/uL (ref 0.0–0.1)
EOS PCT: 0 %
Eosinophils Absolute: 0 10*3/uL (ref 0.0–0.5)
HCT: 33.9 % — ABNORMAL LOW (ref 36.0–46.0)
Hemoglobin: 9.7 g/dL — ABNORMAL LOW (ref 12.0–15.0)
Immature Granulocytes: 0 %
Lymphocytes Relative: 18 %
Lymphs Abs: 1.3 10*3/uL (ref 0.7–4.0)
MCH: 19.8 pg — ABNORMAL LOW (ref 26.0–34.0)
MCHC: 28.6 g/dL — ABNORMAL LOW (ref 30.0–36.0)
MCV: 69 fL — ABNORMAL LOW (ref 80.0–100.0)
Monocytes Absolute: 0.8 10*3/uL (ref 0.1–1.0)
Monocytes Relative: 11 %
Neutro Abs: 5.1 10*3/uL (ref 1.7–7.7)
Neutrophils Relative %: 71 %
PLATELETS: 445 10*3/uL — AB (ref 150–400)
RBC: 4.91 MIL/uL (ref 3.87–5.11)
RDW: 18.2 % — ABNORMAL HIGH (ref 11.5–15.5)
WBC: 7.2 10*3/uL (ref 4.0–10.5)
nRBC: 0 % (ref 0.0–0.2)

## 2018-12-26 LAB — MAGNESIUM: Magnesium: 1.2 mg/dL — ABNORMAL LOW (ref 1.7–2.4)

## 2018-12-26 MED ORDER — HYDROCODONE-ACETAMINOPHEN 5-325 MG PO TABS: ORAL_TABLET | ORAL | 0 refills | Status: DC

## 2018-12-26 MED ORDER — ACETAMINOPHEN 325 MG PO TABS
650.0000 mg | ORAL_TABLET | Freq: Once | ORAL | Status: AC
  Administered 2018-12-26: 650 mg via ORAL
  Filled 2018-12-26: qty 2

## 2018-12-26 MED ORDER — MAGNESIUM OXIDE 400 (241.3 MG) MG PO TABS: 400.0000 mg | ORAL_TABLET | Freq: Two times a day (BID) | ORAL | 0 refills | Status: DC

## 2018-12-26 MED ORDER — POTASSIUM CHLORIDE CRYS ER 20 MEQ PO TBCR: 20.0000 meq | EXTENDED_RELEASE_TABLET | Freq: Two times a day (BID) | ORAL | 0 refills | Status: DC

## 2018-12-26 MED ORDER — HYDROCODONE-ACETAMINOPHEN 5-325 MG PO TABS
1.0000 | ORAL_TABLET | Freq: Once | ORAL | Status: AC
  Administered 2018-12-26: 1 via ORAL
  Filled 2018-12-26: qty 1

## 2018-12-26 MED ORDER — MAGNESIUM OXIDE 400 (241.3 MG) MG PO TABS
400.0000 mg | ORAL_TABLET | Freq: Once | ORAL | Status: AC
  Administered 2018-12-26: 400 mg via ORAL
  Filled 2018-12-26: qty 1

## 2018-12-26 MED ORDER — POTASSIUM CHLORIDE CRYS ER 20 MEQ PO TBCR
60.0000 meq | EXTENDED_RELEASE_TABLET | Freq: Once | ORAL | Status: AC
  Administered 2018-12-26: 60 meq via ORAL
  Filled 2018-12-26: qty 3

## 2018-12-26 NOTE — ED Notes (Addendum)
Date and time results received: 12/26/18 11:42 AM    Test: potassium Critical Value: 2.7  Name of Provider Notified: Kathrynn Humble MD  Orders Received? Or Actions Taken?: acknowledges order and verbalizes will let other provider know; order placed by Kathrynn Humble MD

## 2018-12-26 NOTE — ED Notes (Signed)
Freda Munro from main lab verbalizes will add on/process magnesium.

## 2018-12-26 NOTE — Discharge Instructions (Signed)
Please read and follow all provided instructions.  Your diagnoses today include:  1. Suprapubic pain   2. Hypokalemia   3. Hypomagnesemia     Tests performed today include:  Blood counts and electrolytes - shows low potassium and magnesium  Blood tests to check liver and kidney function  Vital signs. See below for your results today.   Medications prescribed:   Potassium and magnesium supplementation  Take any prescribed medications only as directed.  Home care instructions:   Follow any educational materials contained in this packet.  Follow-up instructions: Please follow-up with your primary care provider in the next 3 days for further evaluation of your symptoms and recheck of your urine test.   Return instructions:  SEEK IMMEDIATE MEDICAL ATTENTION IF:  The pain does not go away or becomes severe   A temperature above 101F develops   Repeated vomiting occurs (multiple episodes)   The pain becomes localized to portions of the abdomen. The right side could possibly be appendicitis. In an adult, the left lower portion of the abdomen could be colitis or diverticulitis.   Blood is being passed in stools or vomit (bright red or black tarry stools)   You develop chest pain, difficulty breathing, dizziness or fainting, or become confused, poorly responsive, or inconsolable (young children)  If you have any other emergent concerns regarding your health  Additional Information: Abdominal (belly) pain can be caused by many things. Your caregiver performed an examination and possibly ordered blood/urine tests and imaging (CT scan, x-rays, ultrasound). Many cases can be observed and treated at home after initial evaluation in the emergency department. Even though you are being discharged home, abdominal pain can be unpredictable. Therefore, you need a repeated exam if your pain does not resolve, returns, or worsens. Most patients with abdominal pain don't have to be admitted to  the hospital or have surgery, but serious problems like appendicitis and gallbladder attacks can start out as nonspecific pain. Many abdominal conditions cannot be diagnosed in one visit, so follow-up evaluations are very important.  Your vital signs today were: BP 127/75    Pulse 65    Temp 97.8 F (36.6 C) (Oral)    Resp 15    Ht 5\' 5"  (1.651 m)    Wt 68 kg    SpO2 100%    BMI 24.95 kg/m  If your blood pressure (bp) was elevated above 135/85 this visit, please have this repeated by your doctor within one month. --------------

## 2018-12-26 NOTE — ED Triage Notes (Signed)
Pt states keflex is having side effects. Pt states she is having fever, chills, and dizziness.

## 2018-12-26 NOTE — ED Provider Notes (Signed)
[facility] DEPT Provider Note   CSN: [id number] Arrival date & time: 12/26/18  1030     History   Chief Complaint Chief Complaint  Patient presents with  . Abdominal Pain  . Back Pain    HPI Angie Cain is a 63 y.o. female.  Patient with history of peripheral vascular disease, DM returns to the emergency department today with complaint of persistence suprapubic and lower abdominal pains with radiation to her back.  Patient was seen in the emergency department 2 days ago.  She had extensive work-up including lab work, UA, CT angio without any emergent findings.  She was started on treatment for UTI.  Culture negative to this point.  She denies fever to me (contradicting RN note).  Patient has associated vomiting which she thinks is from the antibiotic.  Course is constant.  Nothing makes symptoms better or worse.  Denies vaginal bleeding or discharge.     Past Medical History:  Diagnosis Date  . Allergy   . Anxiety    occasional  . Arthritis   . Atherosclerotic peripheral vascular disease with intermittent claudication ([place])    s/p fem-fem bypass 2011. ABI 07/2012 0.5 R 0.65 L  . Diabetes mellitus without complication ([place])   . GERD (gastroesophageal reflux disease)   . Hyperlipidemia   . Hypertension   . Personal history of colonic polyps - adenomas 03/07/2014  . Tobacco abuse     Patient Active Problem List   Diagnosis Date Noted  . Hives 05/15/2018  . Acute renal failure ([place]) 02/21/2018  . Abdominal pain 02/19/2018  . Allergic rhinitis 10/16/2017  . Major depression 10/16/2017  . Iron deficiency anemia 05/08/2017  . Healthcare maintenance 03/18/2017  . Eye discharge 07/31/2016  . Rotator cuff impingement syndrome of left shoulder 03/23/2016  . Osteopenia determined by x-ray 01/25/2015  . Bilateral plantar fasciitis 09/30/2014  . Hx of adenomatous colonic polyps 03/07/2014  . Low back pain 12/18/2013  . GERD  (gastroesophageal reflux disease) 06/06/2013  . Hyperlipidemia associated with type 2 diabetes mellitus ([place]) 06/06/2013  . Tobacco use disorder 07/21/2007  . Essential hypertension 07/21/2007  . IDDM (insulin dependent diabetes mellitus) ([place]) 07/21/2007  . Insulin dependent type 2 diabetes mellitus, uncontrolled ([place]) 07/20/1993    Past Surgical History:  Procedure Laterality Date  . VAGINAL HYSTERECTOMY    . 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      Home Medications    Prior to Admission medications   Medication Sig Start Date End Date Taking? Authorizing Provider  ACCU-CHEK SOFTCLIX LANCETS lancets Use to check blood sugar up to 3 times a day 03/10/18   Lars Mage, MD  amLODipine (NORVASC) 5 MG tablet Take 1 tablet (5 mg total) by mouth daily. 08/16/18 12/24/18  Lars Mage, MD  aspirin EC 81 MG tablet Take 81 mg by mouth daily.    [provider]  atorvastatin (LIPITOR) 40 MG tablet Take 1 tablet (40 mg total) by mouth daily. 06/20/18   Chundi, Verne Spurr, MD  Blood Glucose Monitoring Suppl (ACCU-CHEK AVIVA PLUS) w/Device KIT Check blood sugar up to 3 times a day 05/18/18   Lars Mage, MD  calcium-vitamin D (OSCAL WITH D) 500-200 MG-UNIT tablet Take 1 tablet by mouth 2 (two) times daily. Patient taking differently: Take 2 tablets by mouth.  05/12/18   Kathi Ludwig, MD  cephALEXin (KEFLEX) 500 MG  capsule Take 1 capsule (500 mg total) by mouth 2 (two) times daily. 12/24/18   Fawze, Mina A, PA-C  diphenhydrAMINE (BENADRYL) 25 MG tablet Take 1 tablet (25 mg total) by mouth every 8 (eight) hours as needed for up to 5 days for itching. 05/18/18 12/24/18  Colbert Ewing, MD  ferrous sulfate 325 (65 FE) MG EC tablet Take 1 tablet (325 mg total) by mouth daily with breakfast. Patient not taking: Reported on 12/24/2018 11/11/17   Chundi, Verne Spurr, MD  glucose blood (ACCU-CHEK AVIVA PLUS) test strip USE 3 TO 4  TIMES DAILY TO CHECK BLOOD SUGAR The patient is insulin requiring, ICD 10 code E11.9. The patient tests 4 times per day. 05/12/18   Kathi Ludwig, MD  Insulin Pen Needle 31G X 5 MM MISC INJECT 24 UNITS INTO THE SKIN AT BEDTIME 05/12/18   Kathi Ludwig, MD  Insulin Syringe-Needle U-100 31G X 15/64" 0.3 ML MISC Use to inject insulin one time daily 08/13/17   Annia Belt, MD  LANTUS SOLOSTAR 100 UNIT/ML Solostar Pen INJECT 24 UNITS INTO THE SKIN AT 10:00PM EVERY EVENING Patient taking differently: Inject 29 Units into the skin at bedtime.  09/30/18   Chundi, Verne Spurr, MD  latanoprost (XALATAN) 0.005 % ophthalmic solution Place 1 drop into both eyes at bedtime.    [provider]  omeprazole (PRILOSEC) 40 MG capsule TAKE ONE CAPSULE BY MOUTH ONCE DAILY AS NEEDED Patient taking differently: Take 40 mg by mouth daily as needed (acid reflux).  12/23/17   Lars Mage, MD  phenazopyridine (PYRIDIUM) 95 MG tablet Take 1 tablet (95 mg total) by mouth 3 (three) times daily as needed for pain. 12/24/18   Fawze, Mina A, PA-C  sertraline (ZOLOFT) 25 MG tablet Take 1 tablet (25 mg total) by mouth daily. 06/20/18 07/20/18  Lars Mage, MD  sitaGLIPtin-metformin (JANUMET) 50-1000 MG tablet Take 1 tablet by mouth 2 (two) times daily with a meal. 05/12/18 12/24/18  Kathi Ludwig, MD    Family History Family History  Problem Relation Age of Onset  . Diabetes Mother   . Diabetes Father   . Diabetes Sister   . Hypertension Sister   . Diabetes Brother   . Hypertension Brother   . Colon cancer Neg Hx   . Esophageal cancer Neg Hx   . Stomach cancer Neg Hx   . Rectal cancer Neg Hx     Social History Social History   Tobacco Use  . Smoking status: Current Every Day Smoker    Packs/day: 0.10    Types: Cigarettes  . Smokeless tobacco: Never Used  . Tobacco comment: Smokes 1-2 cigs/day.  Substance Use Topics  . Alcohol use: Yes    Alcohol/week: 1.0 - 2.0 standard drinks     Types: 1 - 2 Glasses of wine per week    Comment: Occasionally  . Drug use: No     Allergies   Bupropion; Metronidazole; and Shellfish allergy   Review of Systems Review of Systems  Constitutional: Negative for fever.  HENT: Negative for rhinorrhea and sore throat.   Eyes: Negative for redness.  Respiratory: Negative for cough.   Cardiovascular: Negative for chest pain.  Gastrointestinal: Positive for abdominal pain, nausea and vomiting. Negative for diarrhea.  Genitourinary: Negative for dysuria.  Musculoskeletal: Positive for back pain. Negative for myalgias.  Skin: Negative for rash.  Neurological: Negative for headaches.     Physical Exam Updated Vital Signs BP 123/71 (BP Location: Left Arm)   Pulse 63  Temp 97.8 F (36.6 C) (Oral)   Resp 16   Ht '5\' 5"'  (1.651 m)   Wt 68 kg   SpO2 97%   BMI 24.95 kg/m   Physical Exam Vitals signs and nursing note reviewed.  Constitutional:      Appearance: She is well-developed.  HENT:     Head: Normocephalic and atraumatic.  Eyes:     General:        Right eye: No discharge.        Left eye: No discharge.     Conjunctiva/sclera: Conjunctivae normal.  Neck:     Musculoskeletal: Normal range of motion and neck supple.  Cardiovascular:     Rate and Rhythm: Normal rate and regular rhythm.     Heart sounds: Normal heart sounds.  Pulmonary:     Effort: Pulmonary effort is normal.     Breath sounds: Normal breath sounds.  Abdominal:     Palpations: Abdomen is soft.     Tenderness: There is abdominal tenderness (Mild suprapubic tenderness) in the suprapubic area. There is no right CVA tenderness, left CVA tenderness, guarding or rebound.  Skin:    General: Skin is warm and dry.  Neurological:     Mental Status: She is alert.      ED Treatments / Results  Labs (all labs ordered are listed, but only abnormal results are displayed) Labs Reviewed  CBC WITH DIFFERENTIAL/PLATELET - Abnormal; Notable for the following  components:      Result Value   Hemoglobin 9.7 (*)    HCT 33.9 (*)    MCV 69.0 (*)    MCH 19.8 (*)    MCHC 28.6 (*)    RDW 18.2 (*)    Platelets 445 (*)    All other components within normal limits  BASIC METABOLIC PANEL - Abnormal; Notable for the following components:   Potassium 2.7 (*)    Glucose, Bld 192 (*)    Calcium 8.6 (*)    All other components within normal limits  URINALYSIS, ROUTINE W REFLEX MICROSCOPIC - Abnormal; Notable for the following components:   Color, Urine AMBER (*)    APPearance HAZY (*)    Glucose, UA 50 (*)    Protein, ur 100 (*)    Nitrite POSITIVE (*)    All other components within normal limits  MAGNESIUM - Abnormal; Notable for the following components:   Magnesium 1.2 (*)    All other components within normal limits    EKG None  Radiology No results found.  Procedures Procedures (including critical care time)  Medications Ordered in ED Medications  acetaminophen (TYLENOL) tablet 650 mg (650 mg Oral Given 12/26/18 1111)  potassium chloride SA (K-DUR,KLOR-CON) CR tablet 60 mEq (60 mEq Oral Given 12/26/18 1224)  magnesium oxide (MAG-OX) tablet 400 mg (400 mg Oral Given 12/26/18 1224)  HYDROcodone-acetaminophen (NORCO/VICODIN) 5-325 MG per tablet 1 tablet (1 tablet Oral Given 12/26/18 1336)     Initial Impression / Assessment and Plan / ED Course  I have reviewed the triage vital signs and the nursing notes.  Pertinent labs & imaging results that were available during my care of the patient were reviewed by me and considered in my medical decision making (see chart for details).     Patient seen and examined.  Reviewed previous work-up.  Will recheck blood work and UA.  Will give Tylenol for pain.  Patient discussed with Dr. Venora Maples.  Vital signs reviewed and are as follows: BP 123/71 (BP Location: Left  Arm)   Pulse 63   Temp 97.8 F (36.6 C) (Oral)   Resp 16   Ht '5\' 5"'  (1.651 m)   Wt 68 kg   SpO2 97%   BMI 24.95 kg/m   Patient  discussed with and seen by Dr. Venora Maples.  Worsening hypokalemia and hypermagnesemia noted.  Otherwise lab work is stable.  Again equivocal findings on UA.  Patient will continue Keflex for UTI.  Home with pain medication and potassium/magnesium supplementation.  Encouraged PCP follow-up in 3 days for recheck.  Patient counseled on use of narcotic pain medications. Counseled not to combine these medications with others containing tylenol. Urged not to drink alcohol, drive, or perform any other activities that requires focus while taking these medications. The patient verbalizes understanding and agrees with the plan.   Final Clinical Impressions(s) / ED Diagnoses   Final diagnoses:  Suprapubic pain  Hypokalemia  Hypomagnesemia   Patient with lower abdominal pain.  Currently undergoing treatment for UTI.  Urine culture without growth at this point.  Recent abdominal CT angiography without acute abnormality.  Otherwise exam and symptoms seem stable.  Continue symptomatic treatment.  Patient with hypo-kalemia and hypomagnesemia.  Patient was given oral supplementation here in emergency department.  She will continue this for 1 week.  She will need to have her levels rechecked by her primary care doctor.  Patient aware.    ED Discharge Orders         Ordered    potassium chloride SA (K-DUR,KLOR-CON) 20 MEQ tablet  2 times daily     12/26/18 1439    magnesium oxide (MAG-OX) 400 (241.3 Mg) MG tablet  2 times daily     12/26/18 1439    HYDROcodone-acetaminophen (NORCO/VICODIN) 5-325 MG tablet     12/26/18 1454           Carlisle Cater, PA-C 12/26/18 1529    Jola Schmidt, MD 12/26/18 1637

## 2018-12-26 NOTE — ED Notes (Signed)
Patientt is aware that a urine specimen is needed.

## 2018-12-27 LAB — URINE CULTURE: Culture: NO GROWTH

## 2019-01-05 ENCOUNTER — Encounter: Payer: Self-pay | Admitting: Internal Medicine

## 2019-01-05 NOTE — Progress Notes (Signed)
   CC: Diabetes Follow up  HPI:  Ms.Mychelle Gosdin is a 63 y.o. with essential hypertension, insulin-dependent diabetes mellitus, iron deficiency anemia who presented for diabetes follow-up. Please see problem based charting for evaluation, assessment, and plan.  Was recently seen in [facility] ED with symptoms of uti. UA was equivocal with positive nitrite, negative bacteria and leukocytes. The patient was prescribed course of keflex which she has completed.   Past Medical History:  Diagnosis Date  . Allergy   . Anxiety    occasional  . Arthritis   . Atherosclerotic peripheral vascular disease with intermittent claudication ([place])    s/p fem-fem bypass 2011. ABI 07/2012 0.5 R 0.65 L  . Diabetes mellitus without complication ([place])   . GERD (gastroesophageal reflux disease)   . Hyperlipidemia   . Hypertension   . IDDM (insulin dependent diabetes mellitus) ([place]) 07/21/2007   July 2011: Femoral-femoral bypass  ABI 08/11/2012 notable for 0.4-0.59 on right ankle suggestive of severe arterial occlusive disease at rest and 0.60-0.7 on left ankle suggestive of moderate arterial occlusive disease at rest.   . Personal history of colonic polyps - adenomas 03/07/2014  . Tobacco abuse    Review of Systems:    Review of Systems  Constitutional: Negative for chills and fever.  Cardiovascular: Positive for chest pain. Negative for palpitations.  Gastrointestinal: Negative for abdominal pain, nausea and vomiting.  Neurological: Negative for dizziness.   Physical Exam:  Vitals:   01/06/19 1424  BP: (!) 134/57  Pulse: 88  Temp: 98 F (36.7 C)  TempSrc: Oral  SpO2: 100%  Weight: 149 lb 9.6 oz (67.9 kg)  Height: 5\' 5"  (1.651 m)   Physical Exam  Constitutional: Appears well-developed and well-nourished. No distress.  HENT:  Head: Normocephalic and atraumatic.  Eyes: Conjunctivae are normal.  Cardiovascular: Normal rate, regular rhythm and normal heart sounds.  Respiratory: Effort normal and  breath sounds normal. No respiratory distress. No wheezes.  GI: Soft. Bowel sounds are normal. No distension. There is no tenderness.  Musculoskeletal: No edema.  Neurological: Is alert.  Skin: Not diaphoretic. No erythema.  Psychiatric: Normal mood and affect. Behavior is normal. Judgment and thought content normal.    Assessment & Plan:   See Encounters Tab for problem based charting.  Patient discussed with Dr. Evette Doffing

## 2019-01-06 ENCOUNTER — Other Ambulatory Visit: Payer: Self-pay

## 2019-01-06 ENCOUNTER — Ambulatory Visit: Payer: Medicaid Other | Admitting: Internal Medicine

## 2019-01-06 VITALS — BP 134/57 | HR 88 | Temp 98.0°F | Ht 65.0 in | Wt 149.6 lb

## 2019-01-06 DIAGNOSIS — Z23 Encounter for immunization: Secondary | ICD-10-CM

## 2019-01-06 DIAGNOSIS — Z79899 Other long term (current) drug therapy: Secondary | ICD-10-CM

## 2019-01-06 DIAGNOSIS — E1165 Type 2 diabetes mellitus with hyperglycemia: Secondary | ICD-10-CM | POA: Diagnosis not present

## 2019-01-06 DIAGNOSIS — F17200 Nicotine dependence, unspecified, uncomplicated: Secondary | ICD-10-CM

## 2019-01-06 DIAGNOSIS — IMO0002 Reserved for concepts with insufficient information to code with codable children: Secondary | ICD-10-CM

## 2019-01-06 DIAGNOSIS — D509 Iron deficiency anemia, unspecified: Secondary | ICD-10-CM | POA: Diagnosis not present

## 2019-01-06 DIAGNOSIS — E118 Type 2 diabetes mellitus with unspecified complications: Secondary | ICD-10-CM | POA: Diagnosis not present

## 2019-01-06 DIAGNOSIS — Z794 Long term (current) use of insulin: Secondary | ICD-10-CM

## 2019-01-06 DIAGNOSIS — Z87440 Personal history of urinary (tract) infections: Secondary | ICD-10-CM

## 2019-01-06 DIAGNOSIS — I10 Essential (primary) hypertension: Secondary | ICD-10-CM | POA: Diagnosis not present

## 2019-01-06 DIAGNOSIS — Z0000 Encounter for general adult medical examination without abnormal findings: Secondary | ICD-10-CM

## 2019-01-06 LAB — GLUCOSE, CAPILLARY: Glucose-Capillary: 227 mg/dL — ABNORMAL HIGH (ref 70–99)

## 2019-01-06 LAB — POCT GLYCOSYLATED HEMOGLOBIN (HGB A1C): Hemoglobin A1C: 8.5 % — AB (ref 4.0–5.6)

## 2019-01-06 MED ORDER — FERROUS SULFATE 325 (65 FE) MG PO TABS: 325.0000 mg | ORAL_TABLET | Freq: Every day | ORAL | 1 refills | Status: DC

## 2019-01-06 MED ORDER — INSULIN GLARGINE 100 UNIT/ML SOLOSTAR PEN: 32.0000 [IU] | PEN_INJECTOR | Freq: Every day | SUBCUTANEOUS | 1 refills | Status: DC

## 2019-01-06 NOTE — Assessment & Plan Note (Signed)
Patient's last cbc done 12/26/18 showed hb of 9.7 with a baseline ranging 10-13. Iron studies done in August showed ferritin of 10 and iron of 18. It appears that the patients iron supplementation that was previously prescribed was discontinued by an ED PA Carlisle Cater.   Assessment and plan The patient is still anemic and likely to benefit from further iron supplementation. The patient should continue to be on iron supplementation for at least 3-6 months after normalization of iron. I will prescribe oral iron supplementation again at this visit and check iron levels at subsequent visit.

## 2019-01-06 NOTE — Assessment & Plan Note (Signed)
The patient's blood pressure during this visit was 134/57. The patient is currently taking amlodipine 5mg  qd. His last blood pressure visits are   BP Readings from Last 3 Encounters:  01/06/19 (!) 134/57  12/26/18 130/74  12/24/18 (!) 153/84   Assessment and Plan Blood pressure well controlled, recommend continuing amlodipine 5mg  qd.

## 2019-01-06 NOTE — Patient Instructions (Signed)
It was a pleasure to see you today Ms. Hillmer. Please make the following changes:  Please increase your lantus dose to 32units from your prior 29units. Please make sure you get your mammogram and dexa scan done  You got a flu shot during this visit  Please resume taking your iron tablets   If you have any questions or concerns, please call our clinic at [phone] between 9am-5pm and after hours call [phone] and ask for the internal medicine resident on call. If you feel you are having a medical emergency please call 911.   Thank you, we look forward to help you remain healthy!  Lars Mage, MD Internal Medicine PGY2

## 2019-01-06 NOTE — Assessment & Plan Note (Addendum)
The patient's last a1c=9.0 on July 2019. The patient's home blood glucose measurements over the past month have ranged 62-298. The patient is currently taking lantus 29u and sitagliptin-metformin 50-1000mg  bid. The patient is compliant with medication. She has not had any significant weight changes.  Assessment and plan Patient's a1c has improved from 9 to 8.5. Patient's glucose levels still need to be lowered and therefore increased lantus from 29u to 32 units and continued janumet 50-1000mg  bid.

## 2019-01-06 NOTE — Assessment & Plan Note (Signed)
Patient was given influenza vaccine during this encounter and dexa scan ordered.

## 2019-01-07 LAB — BMP8+ANION GAP
Anion Gap: 18 mmol/L (ref 10.0–18.0)
BUN/Creatinine Ratio: 22 (ref 12–28)
BUN: 16 mg/dL (ref 8–27)
CALCIUM: 9.5 mg/dL (ref 8.7–10.3)
CO2: 21 mmol/L (ref 20–29)
Chloride: 101 mmol/L (ref 96–106)
Creatinine, Ser: 0.73 mg/dL (ref 0.57–1.00)
GFR calc Af Amer: 101 mL/min/{1.73_m2} (ref >59)
GFR calc non Af Amer: 87 mL/min/{1.73_m2} (ref >59)
Glucose: 180 mg/dL — ABNORMAL HIGH (ref 65–99)
Potassium: 4.1 mmol/L (ref 3.5–5.2)
Sodium: 140 mmol/L (ref 134–144)

## 2019-01-07 LAB — MAGNESIUM: Magnesium: 1.5 mg/dL — ABNORMAL LOW (ref 1.6–2.3)

## 2019-01-09 NOTE — Progress Notes (Signed)
Internal Medicine Clinic Attending  Case discussed with Dr. Chundi at the time of the visit.  We reviewed the resident's history and exam and pertinent patient test results.  I agree with the assessment, diagnosis, and plan of care documented in the resident's note. 

## 2019-01-10 ENCOUNTER — Other Ambulatory Visit: Payer: Self-pay | Admitting: Internal Medicine

## 2019-01-10 ENCOUNTER — Telehealth: Payer: Self-pay

## 2019-01-10 MED ORDER — MAGNESIUM OXIDE 400 (241.3 MG) MG PO TABS: 400.0000 mg | ORAL_TABLET | Freq: Two times a day (BID) | ORAL | 0 refills | Status: AC

## 2019-01-10 NOTE — Telephone Encounter (Signed)
Called pt - informed "potassium level is normal (@ 4.1), her magnesium is slightly low (@ 1.5) and needs to be repleted" per Dr Maricela Bo. Informed her doctor wants her to take Magnesium 400 mg take 1 tab 2 times a day x 4 days. Voiced understanding. She asked about iron pill -stated it was ordered/sent to [facility] on 1/24.  I will call Magnesium rx to [facility] ("Print" when ordered).

## 2019-01-10 NOTE — Telephone Encounter (Signed)
Requesting lab result. Please call pt back.  

## 2019-01-10 NOTE — Progress Notes (Signed)
Patient with mg level of 1.5 will replete Mg oxide 400mg  bid for 4 days.   Lars Mage, MD Internal Medicine PGY2 [id number]:[phone] 01/10/2019, 11:39 AM

## 2019-01-10 NOTE — Telephone Encounter (Signed)
-----   Message from Lars Mage, MD sent at 01/10/2019 11:43 AM EST ----- [place],  [facility] you are doing great! I was unable to reach Ms. Bamburg to inform her to pick up and take the magnesium I have sent in for her. I left a message on voicemail for her to call back, when she calls please let her know that her potassium level is normal, her magnesium is slightly low and needs to be repleted. Thanks   [employer]

## 2019-01-30 ENCOUNTER — Ambulatory Visit
Admission: RE | Admit: 2019-01-30 | Discharge: 2019-01-30 | Disposition: A | Discharge status: Home / Self Care | Payer: Medicaid Other | Source: Ambulatory Visit | Attending: Internal Medicine | Admitting: Internal Medicine

## 2019-02-01 ENCOUNTER — Other Ambulatory Visit: Payer: Self-pay | Admitting: Internal Medicine

## 2019-02-01 DIAGNOSIS — R928 Other abnormal and inconclusive findings on diagnostic imaging of breast: Secondary | ICD-10-CM

## 2019-02-03 ENCOUNTER — Other Ambulatory Visit: Payer: Self-pay | Admitting: Internal Medicine

## 2019-02-03 ENCOUNTER — Ambulatory Visit
Admission: RE | Admit: 2019-02-03 | Discharge: 2019-02-03 | Disposition: A | Discharge status: Home / Self Care | Payer: Medicaid Other | Source: Ambulatory Visit | Attending: Internal Medicine | Admitting: Internal Medicine

## 2019-02-03 DIAGNOSIS — R921 Mammographic calcification found on diagnostic imaging of breast: Secondary | ICD-10-CM

## 2019-02-03 DIAGNOSIS — R928 Other abnormal and inconclusive findings on diagnostic imaging of breast: Secondary | ICD-10-CM

## 2019-02-08 ENCOUNTER — Ambulatory Visit
Admission: RE | Admit: 2019-02-08 | Discharge: 2019-02-08 | Disposition: A | Discharge status: Home / Self Care | Payer: Medicaid Other | Source: Ambulatory Visit | Attending: Internal Medicine | Admitting: Internal Medicine

## 2019-02-08 DIAGNOSIS — R921 Mammographic calcification found on diagnostic imaging of breast: Secondary | ICD-10-CM

## 2019-02-08 HISTORY — PX: BREAST BIOPSY: SHX20

## 2019-03-14 ENCOUNTER — Other Ambulatory Visit: Payer: Self-pay | Admitting: *Deleted

## 2019-03-14 DIAGNOSIS — M8580 Other specified disorders of bone density and structure, unspecified site: Secondary | ICD-10-CM

## 2019-03-14 MED ORDER — CALCIUM CARBONATE-VITAMIN D 500-200 MG-UNIT PO TABS: 1.0000 | ORAL_TABLET | Freq: Two times a day (BID) | ORAL | 3 refills | Status: DC

## 2019-03-16 ENCOUNTER — Telehealth: Payer: Self-pay | Admitting: *Deleted

## 2019-03-16 ENCOUNTER — Other Ambulatory Visit: Payer: Self-pay | Admitting: *Deleted

## 2019-03-16 DIAGNOSIS — E1165 Type 2 diabetes mellitus with hyperglycemia: Secondary | ICD-10-CM

## 2019-03-16 DIAGNOSIS — IMO0002 Reserved for concepts with insufficient information to code with codable children: Secondary | ICD-10-CM

## 2019-03-16 DIAGNOSIS — Z794 Long term (current) use of insulin: Principal | ICD-10-CM

## 2019-03-16 DIAGNOSIS — M8580 Other specified disorders of bone density and structure, unspecified site: Secondary | ICD-10-CM

## 2019-03-16 NOTE — Telephone Encounter (Signed)
If she is concerned, we can do a televisit. Thanks!

## 2019-03-16 NOTE — Telephone Encounter (Signed)
If the patient has noted new chest pain, please have her evaluated by telehealth in [place]

## 2019-03-16 NOTE — Telephone Encounter (Signed)
Tried to help patient reset meter that was getting an E-9 low battery error message. She said it still did not work. She requests a new meter as hers is no longer being made. I will leave a new Accu check guide meter at hte front desk for her to pick up at her convenience. I informed her that I would request a new prescriptions for strips and lancets to use with this meter.

## 2019-03-16 NOTE — Telephone Encounter (Signed)
Pt calls and states she has had "chest pain" at intervals since jan 2020. This started after she had a biopsy. Denies short breath. Denies cough. Along with the pain she has N&V and a slight h/a but not always. These are very vague but she is clear at intervals since 12/2018, NOTHING NEW IN RECENT MONTH.  Also she has a problem w/ her glucometer and testing blood sugars Sending to dr Maricela Bo for pain and donnap. For diab. Part Ph# [phone]

## 2019-03-16 NOTE — Telephone Encounter (Signed)
It is not new, it has been ongoing since biopsy 12/2018 not daily it is at intervals. She states it is no worse and unchanged. Explained to her this would not be a reason to bring her in at this time necessarily, she stated she was wanting to make dr aware that this started in jan 2020 after biopsy

## 2019-03-17 MED ORDER — CALCIUM CARBONATE-VITAMIN D 500-200 MG-UNIT PO TABS: 1.0000 | ORAL_TABLET | Freq: Two times a day (BID) | ORAL | 3 refills | Status: AC

## 2019-03-17 MED ORDER — ACCU-CHEK FASTCLIX LANCETS MISC: 6 refills | Status: DC

## 2019-03-17 MED ORDER — GLUCOSE BLOOD VI STRP: ORAL_STRIP | 12 refills | Status: DC

## 2019-03-17 MED ORDER — INSULIN GLARGINE 100 UNIT/ML SOLOSTAR PEN: 32.0000 [IU] | PEN_INJECTOR | Freq: Every day | SUBCUTANEOUS | 1 refills | Status: DC

## 2019-03-17 NOTE — Telephone Encounter (Signed)
Angie Cain picked up her new Accu chek guide glucometer today. She was instructed on how to use the accu fastclix lancing device. She repeated back demonstration.  She reports that she did get a reading from her old meter last night.

## 2019-03-31 ENCOUNTER — Encounter: Payer: Medicaid Other | Admitting: Internal Medicine

## 2019-04-07 ENCOUNTER — Other Ambulatory Visit: Payer: Self-pay

## 2019-04-07 ENCOUNTER — Ambulatory Visit: Payer: Medicaid Other | Admitting: Internal Medicine

## 2019-04-07 DIAGNOSIS — D242 Benign neoplasm of left breast: Secondary | ICD-10-CM | POA: Insufficient documentation

## 2019-04-07 NOTE — Progress Notes (Signed)
  [facility] Internal Medicine Residency Telephone Encounter Continuity Care Appointment  Patient was not able to be reached.  Lars Mage, MD Internal Medicine PGY2 [id number]:[phone] , 2:18 PM

## 2019-04-07 NOTE — Assessment & Plan Note (Signed)
Patient had a screening mammogram done in February 2020 which showed scattered areas of fibroglandular density especially in left breast which prompted diagnostic mammogram of left breast.  Left breast diagnostic mammogram showed indeterminate left breast calcifications.  Therefore stereotactic guided core biopsy of left breast calcification was done that showed fibrocystic and fibroadenoma changes with calcification.

## 2019-04-12 ENCOUNTER — Telehealth: Payer: Self-pay

## 2019-04-12 NOTE — Telephone Encounter (Signed)
Return pt's call - stated she missed call on Friday (telehealth from Dr Maricela Bo). C/o diarrhea and stomach pain; also requesting to be tested for covid-19.  NURSING TRIAGE NOTE FOR RESPIRATORY SYMPTOMS  Do you have a fever? No but stated having sweats. Do you have a cough? Yes - non-productive ; stated a dry cough. Do you have shortness of breath more than normal? No Do you have chest pain?Yes "slightly". Are you able to eat and drink normally? Yes but not as much Have you seen a physician for these symptoms? No  Also c/o fatigue.  Action - [facility] tele health appt scheduled tomorrow @ 1115 AM per front office.

## 2019-04-12 NOTE — Telephone Encounter (Signed)
Requesting to speak with a nurse about not feeling well. Please call back.  

## 2019-04-13 ENCOUNTER — Ambulatory Visit (INDEPENDENT_AMBULATORY_CARE_PROVIDER_SITE_OTHER): Payer: Medicaid Other | Admitting: Internal Medicine

## 2019-04-13 ENCOUNTER — Other Ambulatory Visit: Payer: Self-pay

## 2019-04-13 DIAGNOSIS — R1084 Generalized abdominal pain: Secondary | ICD-10-CM | POA: Diagnosis not present

## 2019-04-13 NOTE — Progress Notes (Signed)
Internal Medicine Clinic Attending  Case discussed with Dr. Helberg at the time of the visit.  We reviewed the resident's history and exam and pertinent patient test results.  I agree with the assessment, diagnosis, and plan of care documented in the resident's note.    

## 2019-04-13 NOTE — Progress Notes (Signed)
   CC: Abdominal pain  This is a telephone encounter between Angie Cain and Angie Cain on 04/13/2019 for abdominal pain. The visit was conducted with the patient located at home and [facility] at [facility]. The patient's identity was confirmed using their DOB and current address. The patient has consented to being evaluated through a telephone encounter and understands the associated risks (an examination cannot be done and the patient may need to come in for an appointment) / benefits (allows the patient to remain at home, decreasing exposure to coronavirus). I personally spent 12 minutes on medical discussion.   HPI:  Angie Cain is a 63 y.o. with PMH as below.   Please see A&P for assessment of the patient's acute and chronic medical conditions.   Past Medical History:  Diagnosis Date  . Allergy   . Anxiety    occasional  . Arthritis   . Atherosclerotic peripheral vascular disease with intermittent claudication ([place])    s/p fem-fem bypass 2011. ABI 07/2012 0.5 R 0.65 L  . Diabetes mellitus without complication ([place])   . GERD (gastroesophageal reflux disease)   . Hyperlipidemia   . Hypertension   . IDDM (insulin dependent diabetes mellitus) ([place]) 07/21/2007   July 2011: Femoral-femoral bypass  ABI 08/11/2012 notable for 0.4-0.59 on right ankle suggestive of severe arterial occlusive disease at rest and 0.60-0.7 on left ankle suggestive of moderate arterial occlusive disease at rest.   . Personal history of colonic polyps - adenomas 03/07/2014  . Tobacco abuse    Review of Systems:  Performed and all others negative.  Assessment & Plan:   See Encounters Tab for problem based charting.  Patient discussed with Dr. Dareen Piano

## 2019-04-13 NOTE — Assessment & Plan Note (Signed)
HPI:  Called in with concerns of generalized abdominal pain. She states that this is been going on for several years and attributed to her diabetes. She does not know any correlation with bowel movements. She does not know any correlation with eating. She states that it just kinda comes and goes. Describes it as aching. She has had issues with kidney stones in the past but states that this is different. She denies abdominal bloating, nausea/vomiting, early satiety, acid reflux symptoms, changes in urinary or bowel habits.  Her main concern is whether she has COVID-19. She continually reiterates that she thinks her abdominal pain is related to her diabetes.  A/P: - Advised against travel  - Low likelihood of COVID-19  - Will need more detailed in person evaluation at some point.

## 2019-04-25 ENCOUNTER — Other Ambulatory Visit: Payer: Self-pay | Admitting: Internal Medicine

## 2019-04-25 DIAGNOSIS — E1165 Type 2 diabetes mellitus with hyperglycemia: Secondary | ICD-10-CM

## 2019-04-25 DIAGNOSIS — IMO0002 Reserved for concepts with insufficient information to code with codable children: Secondary | ICD-10-CM

## 2019-04-25 DIAGNOSIS — I10 Essential (primary) hypertension: Secondary | ICD-10-CM

## 2019-06-15 ENCOUNTER — Encounter: Payer: Self-pay | Admitting: *Deleted

## 2019-06-15 LAB — HM DIABETES EYE EXAM

## 2019-06-19 ENCOUNTER — Other Ambulatory Visit: Payer: Self-pay | Admitting: Internal Medicine

## 2019-06-19 DIAGNOSIS — IMO0002 Reserved for concepts with insufficient information to code with codable children: Secondary | ICD-10-CM

## 2019-06-19 DIAGNOSIS — E1169 Type 2 diabetes mellitus with other specified complication: Secondary | ICD-10-CM

## 2019-06-19 DIAGNOSIS — E1165 Type 2 diabetes mellitus with hyperglycemia: Secondary | ICD-10-CM

## 2019-07-18 ENCOUNTER — Other Ambulatory Visit: Payer: Self-pay | Admitting: Internal Medicine

## 2019-07-18 DIAGNOSIS — IMO0002 Reserved for concepts with insufficient information to code with codable children: Secondary | ICD-10-CM

## 2019-07-18 DIAGNOSIS — E1165 Type 2 diabetes mellitus with hyperglycemia: Secondary | ICD-10-CM

## 2019-07-19 ENCOUNTER — Ambulatory Visit (INDEPENDENT_AMBULATORY_CARE_PROVIDER_SITE_OTHER): Payer: Medicare Other | Admitting: Internal Medicine

## 2019-07-19 ENCOUNTER — Encounter: Payer: Self-pay | Admitting: Internal Medicine

## 2019-07-19 ENCOUNTER — Other Ambulatory Visit: Payer: Self-pay

## 2019-07-19 ENCOUNTER — Telehealth: Payer: Self-pay | Admitting: *Deleted

## 2019-07-19 DIAGNOSIS — Z720 Tobacco use: Secondary | ICD-10-CM

## 2019-07-19 DIAGNOSIS — Z711 Person with feared health complaint in whom no diagnosis is made: Secondary | ICD-10-CM

## 2019-07-19 NOTE — Telephone Encounter (Signed)
Thank you :)

## 2019-07-19 NOTE — Assessment & Plan Note (Addendum)
Angie Cain is a 63 year old African-American woman whom I had the pleasure of speaking to on the phone today.  She made a telemedicine appointment because she was concerned about having coronavirus.  She denies fevers, chills, rhinorrhea, lacrimation, nasal congestion, shortness of breath, myalgia, sick contacts and states that she has been avoiding crowds.  The only person that goes in and out of the house is her husband and he has not experienced any symptoms.  She is usually indoors and avoids going outside.  She does however report of Aguesia and decreased appetite however she states this has been going on for "a while."Per chart review, patient was evaluated by Dr. Tarri Abernethy last month with concern of having coronavirus.  Prior to your discussion, she was deemed low risk due to negative subjective reports.  She reports that she is traveling to see her son this Friday in [place] and is somewhat worried.  Plan: - Patient advised not to travel - Pending COVID testing

## 2019-07-19 NOTE — Progress Notes (Signed)
  [facility] Internal Medicine Residency Telephone Encounter Continuity Care Appointment  HPI:   This telephone encounter was created for [name] on 07/19/2019 for the following purpose/cc routine telemedicine.  Chief complaint: "I am worried if I have coronavirus "  Please see problem based charting for further details.   Past Medical History:  Past Medical History:  Diagnosis Date  . Allergy   . Anxiety    occasional  . Arthritis   . Atherosclerotic peripheral vascular disease with intermittent claudication ([place])    s/p fem-fem bypass 2011. ABI 07/2012 0.5 R 0.65 L  . Diabetes mellitus without complication ([place])   . GERD (gastroesophageal reflux disease)   . Hyperlipidemia   . Hypertension   . IDDM (insulin dependent diabetes mellitus) ([place]) 07/21/2007   July 2011: Femoral-femoral bypass  ABI 08/11/2012 notable for 0.4-0.59 on right ankle suggestive of severe arterial occlusive disease at rest and 0.60-0.7 on left ankle suggestive of moderate arterial occlusive disease at rest.   . Personal history of colonic polyps - adenomas 03/07/2014  . Tobacco abuse       ROS:  Review of Systems  Constitutional: Negative.   HENT:       [place]   Eyes: Negative.   Respiratory: Negative.   Cardiovascular: Negative.   Musculoskeletal: Negative.   Skin: Negative.   Neurological: Negative.      Assessment / Plan / Recommendations:   Please see A&P under problem oriented charting for assessment of the patient's acute and chronic medical conditions.   As always, pt is advised that if symptoms worsen or new symptoms arise, they should go to an urgent care facility or to to ER for further evaluation.   Consent and Medical Decision Making:   Patient discussed with Dr. Evette Doffing  This is a telephone encounter between [facility] and Angie Cain on 07/19/2019 for [facility] Tele visit. The visit was conducted with the patient located at home and Angie Cain at [facility]. The patient's  identity was confirmed using their DOB and current address. The patient has consented to being evaluated through a telephone encounter and understands the associated risks (an examination cannot be done and the patient may need to come in for an appointment) / benefits (allows the patient to remain at home, decreasing exposure to coronavirus). I personally spent 15 minutes on medical discussion.

## 2019-07-19 NOTE — Telephone Encounter (Signed)
Patient called in requesting test for Covid. States she's diabetic and has had increase in peripheral neuropathy pain and tingling from right great toe, up leg, behind both legs, and arms. Also c/o H/A, low abdominal pain, low back pain. Denies fever but states, "I'm having sweats." Also, c/o feeling depressed with "everything going on." Placed on [facility] schedule for this afternoon for telehealth visit. Hubbard Hartshorn, RN, BSN

## 2019-07-20 ENCOUNTER — Other Ambulatory Visit: Payer: Self-pay

## 2019-07-20 DIAGNOSIS — Z20822 Contact with and (suspected) exposure to covid-19: Secondary | ICD-10-CM

## 2019-07-20 NOTE — Progress Notes (Signed)
Internal Medicine Clinic Attending  Case discussed with Dr. Agyei at the time of the visit.  We reviewed the resident's history and exam and pertinent patient test results.  I agree with the assessment, diagnosis, and plan of care documented in the resident's note.    

## 2019-07-22 LAB — NOVEL CORONAVIRUS, NAA: SARS-CoV-2, NAA: NOT DETECTED

## 2019-07-22 LAB — SPECIMEN STATUS REPORT

## 2019-07-25 ENCOUNTER — Telehealth: Payer: Self-pay | Admitting: General Practice

## 2019-07-25 NOTE — Telephone Encounter (Signed)
Pt notified of negative test result with understanding

## 2019-08-03 ENCOUNTER — Other Ambulatory Visit: Payer: Self-pay | Admitting: Internal Medicine

## 2019-08-03 DIAGNOSIS — I10 Essential (primary) hypertension: Secondary | ICD-10-CM

## 2019-08-14 NOTE — Progress Notes (Signed)
   CC: Generalized myalgias and fatigue  HPI:  [name] is a 63 y.o. with essential htn, iddm, ida, mdd who presents for follow up of the chronic diseases. Please see problem based charting for evaluation, assessment, and plan.  Past Medical History:  Diagnosis Date  . Allergy   . Anxiety    occasional  . Arthritis   . Atherosclerotic peripheral vascular disease with intermittent claudication ([place])    s/p fem-fem bypass 2011. ABI 07/2012 0.5 R 0.65 L  . Diabetes mellitus without complication ([place])   . GERD (gastroesophageal reflux disease)   . Hyperlipidemia   . Hypertension   . IDDM (insulin dependent diabetes mellitus) ([place]) 07/21/2007   July 2011: Femoral-femoral bypass  ABI 08/11/2012 notable for 0.4-0.59 on right ankle suggestive of severe arterial occlusive disease at rest and 0.60-0.7 on left ankle suggestive of moderate arterial occlusive disease at rest.   . Personal history of colonic polyps - adenomas 03/07/2014  . Tobacco abuse    Review of Systems:    Review of Systems  Constitutional: Positive for malaise/fatigue. Negative for chills and fever.  HENT: Positive for tinnitus.   Eyes: Positive for blurred vision.  Cardiovascular: Negative for chest pain.  Gastrointestinal: Positive for nausea. Negative for abdominal pain and vomiting.  Musculoskeletal: Positive for myalgias.  Neurological: Positive for headaches. Negative for dizziness.  Psychiatric/Behavioral: Negative for depression.   Physical Exam:  Vitals:   08/15/19 1418  BP: (!) 150/72  Pulse: 67  Temp: 99.2 F (37.3 C)  TempSrc: Oral  SpO2: 100%  Weight: 152 lb 6.4 oz (69.1 kg)  Height: 5\' 5"  (1.651 m)   Physical Exam  Constitutional: She is oriented to person, place, and time. She appears well-developed and well-nourished. No distress.  HENT:  Head: Normocephalic and atraumatic.  Eyes: Conjunctivae are normal.  Cardiovascular: Normal rate, regular rhythm and normal heart sounds.   Respiratory: Effort normal and breath sounds normal. No respiratory distress. She has no wheezes.  GI: Soft. Bowel sounds are normal. She exhibits no distension. There is no abdominal tenderness.  Musculoskeletal:        General: Tenderness (right hip) present. No edema.     Comments: 5/5 muscle strength in bilateral upper and lower extremities  Neurological: She is alert and oriented to person, place, and time.  Positive straight leg test on right  Skin: She is not diaphoretic.  Psychiatric: She has a normal mood and affect. Her behavior is normal. Judgment and thought content normal.   Assessment & Plan:   See Encounters Tab for problem based charting.  Patient discussed with Dr. Rebeca Alert

## 2019-08-15 ENCOUNTER — Other Ambulatory Visit: Payer: Self-pay | Admitting: Internal Medicine

## 2019-08-15 ENCOUNTER — Other Ambulatory Visit: Payer: Self-pay

## 2019-08-15 ENCOUNTER — Encounter: Payer: Self-pay | Admitting: Internal Medicine

## 2019-08-15 ENCOUNTER — Ambulatory Visit (HOSPITAL_COMMUNITY)
Admission: RE | Admit: 2019-08-15 | Discharge: 2019-08-15 | Disposition: A | Discharge status: Home / Self Care | Payer: Medicare Other | Source: Ambulatory Visit | Attending: Internal Medicine | Admitting: Internal Medicine

## 2019-08-15 ENCOUNTER — Ambulatory Visit (INDEPENDENT_AMBULATORY_CARE_PROVIDER_SITE_OTHER): Payer: Medicare Other | Admitting: Internal Medicine

## 2019-08-15 VITALS — BP 145/70 | HR 65 | Temp 99.2°F | Ht 65.0 in | Wt 152.4 lb

## 2019-08-15 DIAGNOSIS — Z794 Long term (current) use of insulin: Secondary | ICD-10-CM | POA: Diagnosis not present

## 2019-08-15 DIAGNOSIS — D509 Iron deficiency anemia, unspecified: Secondary | ICD-10-CM

## 2019-08-15 DIAGNOSIS — M791 Myalgia, unspecified site: Secondary | ICD-10-CM | POA: Insufficient documentation

## 2019-08-15 DIAGNOSIS — E1165 Type 2 diabetes mellitus with hyperglycemia: Secondary | ICD-10-CM | POA: Diagnosis not present

## 2019-08-15 DIAGNOSIS — E538 Deficiency of other specified B group vitamins: Secondary | ICD-10-CM | POA: Diagnosis not present

## 2019-08-15 DIAGNOSIS — H9319 Tinnitus, unspecified ear: Secondary | ICD-10-CM

## 2019-08-15 DIAGNOSIS — IMO0002 Reserved for concepts with insufficient information to code with codable children: Secondary | ICD-10-CM

## 2019-08-15 DIAGNOSIS — E559 Vitamin D deficiency, unspecified: Secondary | ICD-10-CM | POA: Diagnosis not present

## 2019-08-15 DIAGNOSIS — I10 Essential (primary) hypertension: Secondary | ICD-10-CM

## 2019-08-15 DIAGNOSIS — M25551 Pain in right hip: Secondary | ICD-10-CM

## 2019-08-15 DIAGNOSIS — M1611 Unilateral primary osteoarthritis, right hip: Secondary | ICD-10-CM | POA: Insufficient documentation

## 2019-08-15 LAB — POCT GLYCOSYLATED HEMOGLOBIN (HGB A1C): Hemoglobin A1C: 7.9 % — AB (ref 4.0–5.6)

## 2019-08-15 LAB — GLUCOSE, CAPILLARY: Glucose-Capillary: 99 mg/dL (ref 70–99)

## 2019-08-15 MED ORDER — AMLODIPINE BESYLATE 5 MG PO TABS: 5.0000 mg | ORAL_TABLET | Freq: Every day | ORAL | 1 refills | Status: DC

## 2019-08-15 MED ORDER — FERROUS SULFATE 325 (65 FE) MG PO TABS: 325.0000 mg | ORAL_TABLET | Freq: Every day | ORAL | 1 refills | Status: DC

## 2019-08-15 MED ORDER — ACCU-CHEK GUIDE VI STRP: ORAL_STRIP | 2 refills | Status: DC

## 2019-08-15 NOTE — Assessment & Plan Note (Signed)
Patient states that she has been having few month history of tinnitus.  She is currently not on any medications that can be contributory to her tinnitus.  She has uncontrolled hypertension and diabetes which can be a cause of her tinnitus.  She does not have any current viral symptoms that can explain her tinnitus.  Assessment and plan Recommended that we optimize her blood pressure and blood glucose levels prior to thinking of other causes for her tinnitus.

## 2019-08-15 NOTE — Assessment & Plan Note (Signed)
Patient states that she has been having progressively worsening symptoms of right hip pain.  She states that the pain initially started after her femoral-femoral bypass for peripheral vascular disease in 2011.  She describes the pain as being present over her right hip, right lower back, right groin and radiating into the right lower extremity.  She says that she the pain is constantly present and ranges from 5-9/10 in intensity.  The pain is not worsened with walking or when she moves from a sitting to standing position.  She states that she occasionally has to change her position in order to find some mild relief.   The pain is alleviated after she takes naproxen.  She does not have any decrease in muscle strength.  The pain is accompanied with numbness and tingling.  Right-sided straight leg test is positive.  Assessment and plan We will get right hip x-ray to determine if patient has any joint space narrowing that can be because of hip osteoarthritis.  Other considerations include sciatica, complication femoral bypass.

## 2019-08-15 NOTE — Assessment & Plan Note (Signed)
Patient says usually has a few month history of generalized muscle aches in both bilateral upper and lower extremities. The aches are accompanied with fatigue and progressive weight gain.  She does not have any localized weakness.  Assessment and plan We will check TSH, free T4, vitamin B12, vitamin D levels.

## 2019-08-15 NOTE — Patient Instructions (Signed)
It was a pleasure to see you today Angie Cain. Please make the following changes:  -I have ordered some labs to determine why you have muscle pain and fatigue -I have ordered a right hip xray to help determine cause of right hip pain  -Please make sure to take janumet twice daily  -Please make sure to take amlodipine daily -follow up in 4 weeks  If you have any questions or concerns, please call our clinic at [phone] between 9am-5pm and after hours call [phone] and ask for the internal medicine resident on call. If you feel you are having a medical emergency please call 911.   Thank you, we look forward to help you remain healthy!  Lars Mage, MD Internal Medicine PGY3

## 2019-08-15 NOTE — Assessment & Plan Note (Signed)
The patient's blood pressure during this visit was 150/72, 67. The patient is currently taking amlodipine 5mg  qd. His last blood pressure visits are   BP Readings from Last 3 Encounters:  08/15/19 (!) 145/70  01/06/19 (!) 134/57  12/26/18 130/74  Assessment and Plan Patient has not been taking amlodipine regularly as she states it makes her sleepy. Informed her to take the medication daily at nighttime.

## 2019-08-15 NOTE — Assessment & Plan Note (Signed)
The patient's last a1c=8.5 in January 2020 and during today's visit is 7.9. The patient's home blood glucose measurements over the past month have ranged 64-184. The patient does not note episodes of hypoglycemia.   The patient is currently being prescribed janumet 50-1000mg  bid and lantus 32u qhs. Patient states that she occasionally forgets to take her medication. She has been taking janumet once daily.    Assessment and plan  Instructed the patient to make sure to take janumet 50-1000mg  twice daily. Follow up in 4 weeks.

## 2019-08-16 LAB — IRON AND TIBC
Iron Saturation: 10 % — ABNORMAL LOW (ref 15–55)
Iron: 31 ug/dL (ref 27–139)
Total Iron Binding Capacity: 310 ug/dL (ref 250–450)
UIBC: 279 ug/dL (ref 118–369)

## 2019-08-16 LAB — VITAMIN D 25 HYDROXY (VIT D DEFICIENCY, FRACTURES): Vit D, 25-Hydroxy: 39.5 ng/mL (ref 30.0–100.0)

## 2019-08-16 LAB — FERRITIN: Ferritin: 21 ng/mL (ref 15–150)

## 2019-08-16 LAB — VITAMIN B12: Vitamin B-12: 312 pg/mL (ref 232–1245)

## 2019-08-16 LAB — T4, FREE: Free T4: 1.05 ng/dL (ref 0.82–1.77)

## 2019-08-16 LAB — TSH: TSH: 0.929 u[IU]/mL (ref 0.450–4.500)

## 2019-08-16 NOTE — Progress Notes (Signed)
Internal Medicine Clinic Attending  Case discussed with Dr. Chundi at the time of the visit.  We reviewed the resident's history and exam and pertinent patient test results.  I agree with the assessment, diagnosis, and plan of care documented in the resident's note.  Alexander Raines, M.D., Ph.D.  

## 2019-09-04 ENCOUNTER — Other Ambulatory Visit: Payer: Self-pay | Admitting: Internal Medicine

## 2019-09-04 DIAGNOSIS — E1169 Type 2 diabetes mellitus with other specified complication: Secondary | ICD-10-CM

## 2019-09-09 NOTE — Progress Notes (Signed)
   CC: Diabetes Follow up  HPI:  [name] is a 63 y.o. with essential htn, iddm, tobacco use disorder, ida who presents for diabetes follow up. Please see problem based charting for evaluation, assessment, and plan.  Past Medical History:  Diagnosis Date  . Allergy   . Anxiety    occasional  . Arthritis   . Atherosclerotic peripheral vascular disease with intermittent claudication ([place])    s/p fem-fem bypass 2011. ABI 07/2012 0.5 R 0.65 L  . Diabetes mellitus without complication ([place])   . GERD (gastroesophageal reflux disease)   . Hyperlipidemia   . Hypertension   . IDDM (insulin dependent diabetes mellitus) ([place]) 07/21/2007   July 2011: Femoral-femoral bypass  ABI 08/11/2012 notable for 0.4-0.59 on right ankle suggestive of severe arterial occlusive disease at rest and 0.60-0.7 on left ankle suggestive of moderate arterial occlusive disease at rest.   . Personal history of colonic polyps - adenomas 03/07/2014  . Tobacco abuse    Review of Systems:    Review of Systems  Respiratory: Negative for cough.   Cardiovascular: Negative for chest pain.  Gastrointestinal: Positive for nausea. Negative for abdominal pain and vomiting.  Musculoskeletal: Positive for joint pain and myalgias.  Neurological: Negative for dizziness and headaches.   Physical Exam:  Vitals:   09/12/19 0953  BP: (!) 141/70  Pulse: 73  Temp: 98 F (36.7 C)  TempSrc: Oral  SpO2: 100%  Weight: 154 lb 8 oz (70.1 kg)  Height: 5\' 5"  (1.651 m)   Physical Exam  Constitutional: Appears well-developed and well-nourished. No distress.  HENT:  Head: Normocephalic and atraumatic.  Eyes: Conjunctivae are normal.  Cardiovascular: Normal rate, regular rhythm and normal heart sounds.  Respiratory: Effort normal and breath sounds normal. No respiratory distress. No wheezes.  GI: Soft. Bowel sounds are normal. No distension. There is no tenderness.  Musculoskeletal: No edema.  Neurological: Is alert.   Skin: Not diaphoretic. No erythema.  Psychiatric: Normal mood and affect. Behavior is normal. Judgment and thought content normal.   Assessment & Plan:   See Encounters Tab for problem based charting.  Patient discussed with Dr. Lynnae January

## 2019-09-12 ENCOUNTER — Encounter (INDEPENDENT_AMBULATORY_CARE_PROVIDER_SITE_OTHER): Payer: Self-pay

## 2019-09-12 ENCOUNTER — Other Ambulatory Visit: Payer: Self-pay

## 2019-09-12 ENCOUNTER — Encounter: Payer: Self-pay | Admitting: Internal Medicine

## 2019-09-12 ENCOUNTER — Ambulatory Visit (INDEPENDENT_AMBULATORY_CARE_PROVIDER_SITE_OTHER): Payer: Medicare Other | Admitting: Internal Medicine

## 2019-09-12 VITALS — BP 141/70 | HR 73 | Temp 98.0°F | Ht 65.0 in | Wt 154.5 lb

## 2019-09-12 DIAGNOSIS — I10 Essential (primary) hypertension: Secondary | ICD-10-CM

## 2019-09-12 DIAGNOSIS — Z23 Encounter for immunization: Secondary | ICD-10-CM | POA: Diagnosis not present

## 2019-09-12 DIAGNOSIS — F320 Major depressive disorder, single episode, mild: Secondary | ICD-10-CM | POA: Diagnosis not present

## 2019-09-12 DIAGNOSIS — M25551 Pain in right hip: Secondary | ICD-10-CM

## 2019-09-12 DIAGNOSIS — Z794 Long term (current) use of insulin: Secondary | ICD-10-CM

## 2019-09-12 DIAGNOSIS — IMO0002 Reserved for concepts with insufficient information to code with codable children: Secondary | ICD-10-CM

## 2019-09-12 DIAGNOSIS — M1611 Unilateral primary osteoarthritis, right hip: Secondary | ICD-10-CM

## 2019-09-12 DIAGNOSIS — E1165 Type 2 diabetes mellitus with hyperglycemia: Secondary | ICD-10-CM

## 2019-09-12 DIAGNOSIS — M791 Myalgia, unspecified site: Secondary | ICD-10-CM | POA: Diagnosis not present

## 2019-09-12 LAB — GLUCOSE, CAPILLARY: Glucose-Capillary: 93 mg/dL (ref 70–99)

## 2019-09-12 MED ORDER — DICLOFENAC SODIUM 1 % TD GEL: 2.0000 g | Freq: Four times a day (QID) | TRANSDERMAL | 1 refills | Status: DC

## 2019-09-12 MED ORDER — ACCU-CHEK GUIDE VI STRP: ORAL_STRIP | 2 refills | Status: DC

## 2019-09-12 NOTE — Assessment & Plan Note (Signed)
The patient's blood pressure during this visit was 141/70. The patient is currently taking amlodipine 5mg  qd. Her last blood pressure visits are   BP Readings from Last 3 Encounters:  09/12/19 (!) 141/70  08/15/19 (!) 145/70  01/06/19 (!) 134/57  Assessment and Plan Patient's blood pressure is at goal. Continue amlodipine 5mg  qd.

## 2019-09-12 NOTE — Assessment & Plan Note (Signed)
The patient's last a1c=7.9 in on 08/15/19. The patient's home blood glucose measurements over the past month have ranged 110-150s. The patient had one early morning hypoglycemic episode with blood glucose of 58.  The patient is currently taking lantus 32u qhs, janumet 50-1000mg  bid. The patient has been missing some doses of janumet.  Patient has gained 2lbs in the past month.   Assessment and plan  Counseled patient on being adherent to janumet. Follow up in 2 months for a1c recheck.

## 2019-09-12 NOTE — Patient Instructions (Signed)
It was a pleasure to see you today [name]. Please make the following changes:  -please apply voltaren gel over your right hip for the pain  -I have referred you to physical therapy for the right hip pain  -I have referred you to [place] to speak about your mood  -please MAKE SURE TO NOT [place]  If you have any questions or concerns, please call our clinic at [phone] between 9am-5pm and after hours call [phone] and ask for the internal medicine resident on call. If you feel you are having a medical emergency please call 911.   Thank you, we look forward to help you remain healthy!  Angie Mage, MD Internal Medicine PGY3

## 2019-09-12 NOTE — Progress Notes (Signed)
Internal Medicine Clinic Attending  Case discussed with Dr. Chundi at the time of the visit.  We reviewed the resident's history and exam and pertinent patient test results.  I agree with the assessment, diagnosis, and plan of care documented in the resident's note. 

## 2019-09-12 NOTE — Assessment & Plan Note (Addendum)
Patient states that she has been eating less over the past few months, she feels that she has had decreased concentration, diffuse myalgias, and has not been enjoying tasks that she usually likes to do. PHQ9 score of 9.   Assessment and plan  The patient seems to have some depressive symptoms for which she will likely benefit from speaking to behavioral health counselor. I am concerned that her myalgias maybe a somatic manifestation of her mood symptoms.   The patient used to take sertraline in 2019, but is not currently. Patient states that she stopped sertraline as she developed hives to the medication.  Will assess response to behavioral health counseling first.

## 2019-09-12 NOTE — Assessment & Plan Note (Signed)
Review of patient's right hip xray shows severe narrowing of the right medial hip joint space which is suggestive of osteoarthritis. The patient has been using ibuprofen over the past 4 weeks and stating that it is giving her pain relief, but she is concerned about GI side affect.   Assessment and plan  Recommended patient to use topical nsaid (voltaren gel). I have also referred her to physical therapy. If these interventions do not work, then will consider intraarticular steroid injection.

## 2019-09-13 ENCOUNTER — Telehealth: Payer: Self-pay | Admitting: Licensed Clinical Social Worker

## 2019-09-13 ENCOUNTER — Encounter: Payer: Self-pay | Admitting: Licensed Clinical Social Worker

## 2019-09-13 NOTE — Telephone Encounter (Signed)
Patient was contacted to discuss the referral from her doctor. Patient did not answer, and a vm was left for the patient.

## 2019-09-19 ENCOUNTER — Encounter: Payer: Self-pay | Admitting: *Deleted

## 2019-09-21 ENCOUNTER — Telehealth: Payer: Self-pay | Admitting: Licensed Clinical Social Worker

## 2019-09-21 NOTE — Telephone Encounter (Signed)
Patient was contacted (2nd attempt) to establish care. Pt did not answer, and a vm was left for the patient to contact our office back to schedule a future appointment.

## 2019-10-05 ENCOUNTER — Encounter: Payer: Self-pay | Admitting: Physical Therapy

## 2019-10-05 ENCOUNTER — Other Ambulatory Visit: Payer: Self-pay

## 2019-10-05 ENCOUNTER — Ambulatory Visit: Payer: Medicare Other | Attending: Internal Medicine | Admitting: Physical Therapy

## 2019-10-05 DIAGNOSIS — R209 Unspecified disturbances of skin sensation: Secondary | ICD-10-CM | POA: Diagnosis present

## 2019-10-05 DIAGNOSIS — M6281 Muscle weakness (generalized): Secondary | ICD-10-CM | POA: Diagnosis present

## 2019-10-05 DIAGNOSIS — M25651 Stiffness of right hip, not elsewhere classified: Secondary | ICD-10-CM

## 2019-10-05 DIAGNOSIS — M25551 Pain in right hip: Secondary | ICD-10-CM | POA: Diagnosis not present

## 2019-10-05 NOTE — Therapy (Signed)
[place], [place], [address] Phone: [phone]   Fax:  [phone]  Physical Therapy Evaluation  Patient Details  Name: Angie Cain MRN: [id number] Date of Birth: 02/17/1956 Referring Provider (PT): Dr. Larey Dresser    Encounter Date: 10/05/2019  PT End of Session - 10/05/19 1353    Visit Number  1    Number of Visits  12    Date for PT Re-Evaluation  11/16/19    Authorization Type  MCR/MCD    PT Start Time  1400    PT Stop Time  1445    PT Time Calculation (min)  45 min    Activity Tolerance  Patient tolerated treatment well    Behavior During Therapy  [facility] for tasks assessed/performed       Past Medical History:  Diagnosis Date  . Allergy   . Anxiety    occasional  . Arthritis   . Atherosclerotic peripheral vascular disease with intermittent claudication ([place])    s/p fem-fem bypass 2011. ABI 07/2012 0.5 R 0.65 L  . Diabetes mellitus without complication ([place])   . GERD (gastroesophageal reflux disease)   . Hyperlipidemia   . Hypertension   . IDDM (insulin dependent diabetes mellitus) 07/21/2007   July 2011: Femoral-femoral bypass  ABI 08/11/2012 notable for 0.4-0.59 on right ankle suggestive of severe arterial occlusive disease at rest and 0.60-0.7 on left ankle suggestive of moderate arterial occlusive disease at rest.   . Personal history of colonic polyps - adenomas 03/07/2014  . Tobacco abuse     Past Surgical History:  Procedure Laterality Date  . VAGINAL HYSTERECTOMY    . 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   Subjective Assessment - 10/05/19 1404    Subjective  Patient reports pain in Rt hip and Rt LE which began a few years ago (after fem bypass) . It has been more constant and may be worsening. She has numbness and tingling in her Rt LE and also feels weaker in that leg as well.  Her Rt. leg is noticeably  smaller than Rt which she is self conscious about.  She has difficulty sleeping, wakes up stiff.  She avoids walking due to pain, needs new footwear. As long as she takes her time she can do what she needs to do.    Pertinent History  Rt arm pain as well, diabetic, hypertension, heart disease, 2011 Fem-fem bypass    Limitations  Sitting;Standing;Walking    Diagnostic tests  08/15/19: Osteoarthritic changes of the hips with severe narrowing of the medial joint space    Patient Stated Goals  To have less pain, be more comfortable    Currently in Pain?  Yes    Pain Score  6     Pain Location  Hip    Pain Orientation  Right    Pain Descriptors / Indicators  Pins and needles;Aching    Pain Type  Chronic pain    Pain Radiating Towards  Rt LE    Pain Onset  More than a month ago    Pain Frequency  Constant    Aggravating Factors   moving quickly    Pain Relieving Factors  moving slow, take time, Aleve    Effect of Pain on Daily Activities  has to move slowly         [facility] PT Assessment - 10/05/19 0001  Assessment   Medical Diagnosis  R hip pain     Referring Provider (PT)  Dr. Larey Dresser     Onset Date/Surgical Date  --   chronic    Prior Therapy  No       Precautions   Precautions  None      Restrictions   Weight Bearing Restrictions  No      Balance Screen   Has the patient fallen in the past 6 months  No   fell last yr and then another time    Has the patient had a decrease in activity level because of a fear of falling?   Yes    Is the patient reluctant to leave their home because of a fear of falling?   No      Home Environment   Living Environment  Private residence    Living Arrangements  Spouse/significant other;Children    Home Access  Level entry    [place]  One level      Prior Function   Level of Independence  Independent with basic ADLs;Independent with household mobility without device;Independent with community mobility without device    Vocation   Unemployed    Vocation Requirements  housekeeping    Leisure  read, arts and crafts, Bible study, family, sewing       Cognition   Overall Cognitive Status  Within Functional Limits for tasks assessed      Observation/Other Assessments   Focus on Therapeutic Outcomes (FOTO)   NT, internet down       Sensation   Light Touch  Impaired by gross assessment    Additional Comments  Rt leg diminished light touch      Posture/Postural Control   Posture/Postural Control  Postural limitations      AROM   Lumbar Flexion  WFL    Lumbar Extension  WFL pain in Rt LE to heel    Lumbar - Right Side Bend  WFL pain in Lumbar     Lumbar - Left Side Bend  WFL    Lumbar - Right Rotation  WFL    Lumbar - Left Rotation  WFL       PROM   Overall PROM Comments  limited to 90 deg Rt hip flexion, ER about 30 deg and IR about 20 deg. pain with Rt hip flexion, ER and IR       Strength   Right Hip Flexion  4/5    Right Hip ABduction  3+/5    Left Hip Flexion  5/5    Left Hip ABduction  3+/5    Right Knee Flexion  5/5    Right Knee Extension  4+/5    Left Knee Flexion  5/5    Left Knee Extension  5/5      Flexibility   Hamstrings  tight     Piriformis  tight       Palpation   Spinal mobility  not tolerated    Palpation comment  Tender, painful Rt gluteals, lumbar and Gr. Trochanter/surounding mm. Trochanter and       Special Tests   Other special tests  deferred Sc[place] test due to confirmed OA       Transfers   Comments  no difficulty, moves normally         Objective measurements completed on examination: See above findings.      PT Education - 10/05/19 1937    Education Details  PT/POC, HEP, hip vs  back pain , eval findings    Person(s) Educated  Patient    Methods  Explanation;Handout    Comprehension  Verbalized understanding;Returned demonstration          PT Long Term Goals - 10/05/19 1939      PT LONG TERM GOAL #1   Title  Pt will be I with HEP for trunk  flexibility, hip and core strength    Time  6    Period  Weeks    Status  New    Target Date  11/16/19      PT LONG TERM GOAL #2   Title  Pt will be able to wake up with min stiffness relieved with stretches, improved 50%    Time  6    Period  Weeks    Status  New    Target Date  11/16/19      PT LONG TERM GOAL #3   Title  Pt will be able to demo hip strength increased by 1/2 muscle grade    Time  6    Period  Weeks    Status  New    Target Date  11/16/19      PT LONG TERM GOAL #4   Title  Pt will be able to shop, walk for 45-60 min with no more than min pain in Rt hip (3/10)    Time  6    Period  Weeks    Status  New    Target Date  11/16/19      PT LONG TERM GOAL #5   Title  Pt will show good form with lifting, squatting for home tasks such as vacuuming and changing lines, etc.    Time  6    Period  Weeks    Status  New    Target Date  11/16/19             Plan - 10/05/19 1943    Clinical Impression Statement  Pt presents for mod complexity eval of Rt hip pain with progressive pain and altered sensation in R leg. She has limitations in ROM, strength and muscle mass. She would like to be able to improve her sleep and the stiffness she has in the morning.  With her severe OA in Rt hip she may have limited benefit but I do feel she could gain more function and reduce pain to allow for recreation, comfort at home.    Personal Factors and Comorbidities  Comorbidity 1;Comorbidity 2;Social Background;Time since onset of injury/illness/exacerbation    Comorbidities  diabetes, bypass surgery due to PVD,  myalgia    Examination-Activity Limitations  Bed Mobility;Reach Overhead;Squat;Stand;Lift;Locomotion Level    Examination-Participation Restrictions  Community Activity;Interpersonal Relationship;Shop;Laundry    Stability/Clinical Decision Making  Evolving/Moderate complexity    Clinical Decision Making  Moderate    Rehab Potential  Excellent    PT Frequency  2x / week     PT Duration  6 weeks    PT Treatment/Interventions  ADLs/Self Care Home Management;Iontophoresis 4mg /ml Dexamethasone;Functional mobility training;Patient/family education;Moist Heat;Therapeutic activities;Ultrasound;Therapeutic exercise;Passive range of motion;Dry needling;Manual techniques;Cryotherapy;Electrical Stimulation;Neuromuscular re-education;Taping    PT Next Visit Plan  check HEP, add piriformis and consider [place], manual or E stim , core    PT Home Exercise Plan  hamstring , hip flexor and knee to chest    Consulted and Agree with Plan of Care  Patient       Patient will benefit from skilled therapeutic intervention in order to improve the  following deficits and impairments:  Decreased range of motion, Difficulty walking, Increased fascial restricitons, Pain, Impaired flexibility, Decreased mobility, Decreased strength, Impaired sensation  Visit Diagnosis: Pain in right hip  Stiffness of right hip, not elsewhere classified  Unspecified disturbances of skin sensation  Muscle weakness (generalized)     Problem List Patient Active Problem List   Diagnosis Date Noted  . Tinnitus 08/15/2019  . Osteoarthritis of right hip 08/15/2019  . Myalgia 08/15/2019  . Concern about disease without diagnosis 07/19/2019  . 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  . Hives 05/15/2018  . Abdominal pain 02/19/2018  . Allergic rhinitis 10/16/2017  . Major depression 10/16/2017  . Iron deficiency anemia 05/08/2017  . Healthcare maintenance 03/18/2017  . Eye discharge 07/31/2016  . Rotator cuff impingement syndrome of left shoulder 03/23/2016  . Osteopenia determined by x-ray 01/25/2015  . Bilateral plantar fasciitis 09/30/2014  . Hx of adenomatous colonic polyps 03/07/2014  . Low back pain 12/18/2013  . GERD (gastroesophageal reflux disease) 06/06/2013  . Hyperlipidemia associated with type 2 diabetes mellitus ([place]) 06/06/2013  . Tobacco use disorder 07/21/2007  . Essential hypertension  07/21/2007  . Insulin dependent type 2 diabetes mellitus, uncontrolled ([place]) 07/20/1993    Dessiree Sze 10/05/2019, 8:15 PM  [facility] [address] [place], [place], [address] Phone: [phone]   Fax:  [phone]  Name: Angie Cain MRN: [id number] Date of Birth: 10-Nov-1956   Raeford Razor, PT 10/05/19 8:15 PM Phone: [phone] Fax: [phone]

## 2019-10-10 ENCOUNTER — Ambulatory Visit: Payer: Medicare Other | Admitting: Licensed Clinical Social Worker

## 2019-10-16 NOTE — Addendum Note (Signed)
Addended by: Hulan Fray on: 10/16/2019 06:35 PM   Modules accepted: Orders

## 2019-10-17 ENCOUNTER — Encounter: Payer: Self-pay | Admitting: Physical Therapy

## 2019-10-17 ENCOUNTER — Ambulatory Visit: Payer: Medicare Other | Attending: Internal Medicine | Admitting: Physical Therapy

## 2019-10-17 ENCOUNTER — Other Ambulatory Visit: Payer: Self-pay

## 2019-10-17 DIAGNOSIS — M25551 Pain in right hip: Secondary | ICD-10-CM | POA: Insufficient documentation

## 2019-10-17 DIAGNOSIS — M6281 Muscle weakness (generalized): Secondary | ICD-10-CM | POA: Insufficient documentation

## 2019-10-17 DIAGNOSIS — R209 Unspecified disturbances of skin sensation: Secondary | ICD-10-CM | POA: Insufficient documentation

## 2019-10-17 DIAGNOSIS — M25651 Stiffness of right hip, not elsewhere classified: Secondary | ICD-10-CM | POA: Diagnosis present

## 2019-10-17 NOTE — Therapy (Signed)
[place], [place], [address] Phone: [phone]   Fax:  [phone]  Physical Therapy Treatment  Patient Details  Name: Angie Cain MRN: [id number] Date of Birth: 01/19/1956 Referring Provider (PT): Dr. Larey Dresser    Encounter Date: 10/17/2019  PT End of Session - 10/17/19 1036    Visit Number  2    Number of Visits  12    Date for PT Re-Evaluation  11/16/19    Authorization Type  MCR/MCD    PT Start Time  0845    PT Stop Time  0928    PT Time Calculation (min)  43 min    Activity Tolerance  Patient tolerated treatment well    Behavior During Therapy  [facility] for tasks assessed/performed       Past Medical History:  Diagnosis Date  . Allergy   . Anxiety    occasional  . Arthritis   . Atherosclerotic peripheral vascular disease with intermittent claudication ([place])    s/p fem-fem bypass 2011. ABI 07/2012 0.5 R 0.65 L  . Diabetes mellitus without complication ([place])   . GERD (gastroesophageal reflux disease)   . Hyperlipidemia   . Hypertension   . IDDM (insulin dependent diabetes mellitus) 07/21/2007   July 2011: Femoral-femoral bypass  ABI 08/11/2012 notable for 0.4-0.59 on right ankle suggestive of severe arterial occlusive disease at rest and 0.60-0.7 on left ankle suggestive of moderate arterial occlusive disease at rest.   . Personal history of colonic polyps - adenomas 03/07/2014  . Tobacco abuse     Past Surgical History:  Procedure Laterality Date  . VAGINAL HYSTERECTOMY    . 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  Subjective Assessment - 10/17/19 0848    Subjective  Patient reportts she has been using her stretches in the morning which have helped. She is still sitff in the morning when she wakes up. She is not having nay pain today.    Pertinent History  Rt arm pain as well, diabetic, hypertension, heart disease,  2011 Fem-fem bypass    Limitations  Sitting;Standing;Walking    Diagnostic tests  08/15/19: Osteoarthritic changes of the hips with severe narrowing of the medial joint space    Patient Stated Goals  To have less pain, be more comfortable    Currently in Pain?  No/denies    Pain Onset  More than a month ago    Pain Frequency  Constant                       [facility] Adult PT Treatment/Exercise - 10/17/19 0001      Knee/Hip Exercises: Stretches   Active Hamstring Stretch  Right;2 reps    Piriformis Stretch Limitations  attmepted but painfu       Knee/Hip Exercises: Seated   Long Arc Quad Limitations  2x10 some crepitus noted in the right knee. Patient advised if it causes pain at home not to do it       Knee/Hip Exercises: Supine   Quad Sets Limitations  2x10 5 sec hold with cuing for glut activation     Other Supine Knee/Hip Exercises  supine clam shell 2x10 yellow      Manual Therapy   Manual Therapy  Joint mobilization;Soft tissue mobilization;Manual Traction    Joint Mobilization  posterior hip mobilization below 90 dgrees  Soft tissue mobilization  to posuterior and anterior hip using roller and manual trigger poin trelease     Manual Traction  LAD to right leg with hip ER;             PT Education - 10/17/19 0849    Education Details  reviewed benefits and risk of TPDN., Given information. Updated HEP.    Person(s) Educated  Patient    Methods  Explanation;Demonstration;Tactile cues;Verbal cues    Comprehension  Verbalized understanding;Returned demonstration;Verbal cues required;Tactile cues required          PT Long Term Goals - 10/05/19 1939      PT LONG TERM GOAL #1   Title  Pt will be I with HEP for trunk flexibility, hip and core strength    Time  6    Period  Weeks    Status  New    Target Date  11/16/19      PT LONG TERM GOAL #2   Title  Pt will be able to wake up with min stiffness relieved with stretches, improved 50%    Time  6     Period  Weeks    Status  New    Target Date  11/16/19      PT LONG TERM GOAL #3   Title  Pt will be able to demo hip strength increased by 1/2 muscle grade    Time  6    Period  Weeks    Status  New    Target Date  11/16/19      PT LONG TERM GOAL #4   Title  Pt will be able to shop, walk for 45-60 min with no more than min pain in Rt hip (3/10)    Time  6    Period  Weeks    Status  New    Target Date  11/16/19      PT LONG TERM GOAL #5   Title  Pt will show good form with lifting, squatting for home tasks such as vacuuming and changing lines, etc.    Time  6    Period  Weeks    Status  New    Target Date  11/16/19            Plan - 10/17/19 1038    Clinical Impression Statement  Patient tolerated treatment well. She had some difficulty telling the difference between pain, pressure, and stretch. Patient has trigger points in her anterior and posterior hip. Therapy spoke with her about trigger point dry needling but the patient was hesitant. Therapy worked on decompressing her hip and she was given 2 new exercises for home.    Personal Factors and Comorbidities  Comorbidity 1;Comorbidity 2;Social Background;Time since onset of injury/illness/exacerbation    Comorbidities  diabetes, bypass surgery due to PVD,  myalgia    Examination-Activity Limitations  Bed Mobility;Reach Overhead;Squat;Stand;Lift;Locomotion Level    Examination-Participation Restrictions  Community Activity;Interpersonal Relationship;Shop;Laundry    Stability/Clinical Decision Making  Evolving/Moderate complexity    Clinical Decision Making  Moderate    Rehab Potential  Excellent    PT Frequency  2x / week    PT Duration  6 weeks    PT Treatment/Interventions  ADLs/Self Care Home Management;Iontophoresis 4mg /ml Dexamethasone;Functional mobility training;Patient/family education;Moist Heat;Therapeutic activities;Ultrasound;Therapeutic exercise;Passive range of motion;Dry needling;Manual  techniques;Cryotherapy;Electrical Stimulation;Neuromuscular re-education;Taping    PT Next Visit Plan  check HEP, add piriformis and consider [place], manual or E stim , core    PT Home Exercise  Plan  hamstring , hip flexor and knee to chest    Consulted and Agree with Plan of Care  Patient       Patient will benefit from skilled therapeutic intervention in order to improve the following deficits and impairments:  Decreased range of motion, Difficulty walking, Increased fascial restricitons, Pain, Impaired flexibility, Decreased mobility, Decreased strength, Impaired sensation  Visit Diagnosis: Pain in right hip  Stiffness of right hip, not elsewhere classified  Unspecified disturbances of skin sensation  Muscle weakness (generalized)     Problem List Patient Active Problem List   Diagnosis Date Noted  . Tinnitus 08/15/2019  . Osteoarthritis of right hip 08/15/2019  . Myalgia 08/15/2019  . Concern about disease without diagnosis 07/19/2019  . 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  . Hives 05/15/2018  . Abdominal pain 02/19/2018  . Allergic rhinitis 10/16/2017  . Major depression 10/16/2017  . Iron deficiency anemia 05/08/2017  . Healthcare maintenance 03/18/2017  . Eye discharge 07/31/2016  . Rotator cuff impingement syndrome of left shoulder 03/23/2016  . Osteopenia determined by x-ray 01/25/2015  . Bilateral plantar fasciitis 09/30/2014  . Hx of adenomatous colonic polyps 03/07/2014  . Low back pain 12/18/2013  . GERD (gastroesophageal reflux disease) 06/06/2013  . Hyperlipidemia associated with type 2 diabetes mellitus ([place]) 06/06/2013  . Tobacco use disorder 07/21/2007  . Essential hypertension 07/21/2007  . Insulin dependent type 2 diabetes mellitus, uncontrolled ([place]) 07/20/1993    Carney Living PT DPT  10/17/2019, 10:43 AM  [facility] [address] [place], [place], [address] Phone: [phone]   Fax:   [phone]  Name: Angie Cain MRN: [id number] Date of Birth: [birth year]

## 2019-10-19 ENCOUNTER — Ambulatory Visit: Payer: Medicare Other | Admitting: Physical Therapy

## 2019-10-19 ENCOUNTER — Other Ambulatory Visit: Payer: Self-pay

## 2019-10-19 ENCOUNTER — Encounter: Payer: Self-pay | Admitting: Physical Therapy

## 2019-10-19 DIAGNOSIS — M25551 Pain in right hip: Secondary | ICD-10-CM | POA: Diagnosis not present

## 2019-10-19 DIAGNOSIS — M25651 Stiffness of right hip, not elsewhere classified: Secondary | ICD-10-CM

## 2019-10-19 DIAGNOSIS — M6281 Muscle weakness (generalized): Secondary | ICD-10-CM

## 2019-10-19 DIAGNOSIS — R209 Unspecified disturbances of skin sensation: Secondary | ICD-10-CM

## 2019-10-19 NOTE — Therapy (Signed)
[place], [place], [address] Phone: [phone]   Fax:  [phone]  Physical Therapy Treatment  Patient Details  Name: Angie Cain MRN: [id number] Date of Birth: [birth year] Referring Provider (PT): Dr. Larey Dresser    Encounter Date: 10/19/2019  PT End of Session - 10/19/19 0807    Visit Number  3    Number of Visits  12    Date for PT Re-Evaluation  11/16/19    Authorization Type  MCR/MCD    PT Start Time  0800    PT Stop Time  0842    PT Time Calculation (min)  42 min    Activity Tolerance  Patient tolerated treatment well    Behavior During Therapy  [facility] for tasks assessed/performed       Past Medical History:  Diagnosis Date  . Allergy   . Anxiety    occasional  . Arthritis   . Atherosclerotic peripheral vascular disease with intermittent claudication ([place])    s/p fem-fem bypass 2011. ABI 07/2012 0.5 R 0.65 L  . Diabetes mellitus without complication ([place])   . GERD (gastroesophageal reflux disease)   . Hyperlipidemia   . Hypertension   . IDDM (insulin dependent diabetes mellitus) 07/21/2007   July 2011: Femoral-femoral bypass  ABI 08/11/2012 notable for 0.4-0.59 on right ankle suggestive of severe arterial occlusive disease at rest and 0.60-0.7 on left ankle suggestive of moderate arterial occlusive disease at rest.   . Personal history of colonic polyps - adenomas 03/07/2014  . Tobacco abuse     Past Surgical History:  Procedure Laterality Date  . VAGINAL HYSTERECTOMY    . 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  Subjective Assessment - 10/19/19 0803    Subjective  Patient reports she is stiff today.    Pertinent History  Rt arm pain as well, diabetic, hypertension, heart disease, 2011 Fem-fem bypass    Limitations  Sitting;Standing;Walking    Diagnostic tests  08/15/19: Osteoarthritic changes of the hips with  severe narrowing of the medial joint space    Patient Stated Goals  To have less pain, be more comfortable    Currently in Pain?  Yes    Pain Score  5     Pain Location  Hip    Pain Orientation  Right    Pain Descriptors / Indicators  Aching    Pain Type  Chronic pain    Pain Onset  More than a month ago    Pain Frequency  Constant    Aggravating Factors   mornings    Pain Relieving Factors  taking time, alleve    Effect of Pain on Daily Activities  has to move slowly                       [facility] Adult PT Treatment/Exercise - 10/19/19 0001      Knee/Hip Exercises: Stretches   Active Hamstring Stretch  Right;2 reps    Active Hamstring Stretch Limitations  seated       Knee/Hip Exercises: Standing   Knee Flexion Limitations  standing march 2x10     Abduction Limitations  x10 right only     Extension Limitations  x10 mod cuing for technique       Knee/Hip Exercises: Seated   Long Arc Quad Limitations  2x10 some crepitus noted in the right  knee. Patient advised if it causes pain at home not to do it     Abd/Adduction Limitations  seated clamshell right       Knee/Hip Exercises: Supine   Quad Sets Limitations  x10       Manual Therapy   Manual Therapy  Joint mobilization;Soft tissue mobilization;Manual Traction    Joint Mobilization  posterior hip mobilization below 90 dgrees     Soft tissue mobilization  to posuterior and anterior hip using roller and manual trigger poin trelease ; roller for IT band. Most of her pain is in her IT band today. Showed how to do at home.     Manual Traction  LAD to right leg with hip ER;             PT Education - 10/19/19 0805    Education Details  reviewed HEP and symptom mangement    Person(s) Educated  Patient    Methods  Explanation;Demonstration;Tactile cues;Verbal cues    Comprehension  Verbalized understanding;Returned demonstration;Verbal cues required;Tactile cues required          PT Long Term Goals -  10/19/19 1007      PT LONG TERM GOAL #1   Title  Pt will be I with HEP for trunk flexibility, hip and core strength    Time  6    Period  Weeks      PT LONG TERM GOAL #2   Title  Pt will be able to wake up with min stiffness relieved with stretches, improved 50%    Time  6    Period  Weeks    Status  On-going      PT LONG TERM GOAL #3   Title  Pt will be able to demo hip strength increased by 1/2 muscle grade    Time  6    Period  Weeks    Status  On-going      PT LONG TERM GOAL #4   Title  Pt will be able to shop, walk for 45-60 min with no more than min pain in Rt hip (3/10)    Time  6    Period  Weeks    Status  On-going      PT LONG TERM GOAL #5   Title  Pt will show good form with lifting, squatting for home tasks such as vacuuming and changing lines, etc.    Time  6    Period  Weeks    Status  On-going            Plan - 10/19/19 1000    Clinical Impression Statement  Patient tolerated treatment well. She hreports sometimes it is hard for her to lie down during the day to do her exercises. She was given standing exercises just on the right. She had the most difficulty with standing hip extension. When kept in a low range she was able to complete.    Personal Factors and Comorbidities  Comorbidity 1;Comorbidity 2;Social Background;Time since onset of injury/illness/exacerbation    Comorbidities  diabetes, bypass surgery due to PVD,  myalgia    Examination-Activity Limitations  Bed Mobility;Reach Overhead;Squat;Stand;Lift;Locomotion Level    Stability/Clinical Decision Making  Evolving/Moderate complexity    Rehab Potential  Excellent    PT Duration  6 weeks    PT Treatment/Interventions  ADLs/Self Care Home Management;Iontophoresis 4mg /ml Dexamethasone;Functional mobility training;Patient/family education;Moist Heat;Therapeutic activities;Ultrasound;Therapeutic exercise;Passive range of motion;Dry needling;Manual techniques;Cryotherapy;Electrical  Stimulation;Neuromuscular re-education;Taping    PT Next Visit Plan  asses  tolerance to new standing exercises. Advance exercises to both legs as tolerated. Continue with manual therapy    PT Home Exercise Plan  hamstring , hip flexor and knee to chest    Consulted and Agree with Plan of Care  Patient       Patient will benefit from skilled therapeutic intervention in order to improve the following deficits and impairments:  Decreased range of motion, Difficulty walking, Increased fascial restricitons, Pain, Impaired flexibility, Decreased mobility, Decreased strength, Impaired sensation  Visit Diagnosis: Pain in right hip  Stiffness of right hip, not elsewhere classified  Unspecified disturbances of skin sensation  Muscle weakness (generalized)     Problem List Patient Active Problem List   Diagnosis Date Noted  . Tinnitus 08/15/2019  . Osteoarthritis of right hip 08/15/2019  . Myalgia 08/15/2019  . Concern about disease without diagnosis 07/19/2019  . 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  . Hives 05/15/2018  . Abdominal pain 02/19/2018  . Allergic rhinitis 10/16/2017  . Major depression 10/16/2017  . Iron deficiency anemia 05/08/2017  . Healthcare maintenance 03/18/2017  . Eye discharge 07/31/2016  . Rotator cuff impingement syndrome of left shoulder 03/23/2016  . Osteopenia determined by x-ray 01/25/2015  . Bilateral plantar fasciitis 09/30/2014  . Hx of adenomatous colonic polyps 03/07/2014  . Low back pain 12/18/2013  . GERD (gastroesophageal reflux disease) 06/06/2013  . Hyperlipidemia associated with type 2 diabetes mellitus ([place]) 06/06/2013  . Tobacco use disorder 07/21/2007  . Essential hypertension 07/21/2007  . Insulin dependent type 2 diabetes mellitus, uncontrolled ([place]) 07/20/1993    Carney Living PT DPT  10/19/2019, 10:17 AM  [facility] [address] [place], [place], [address] Phone:  [phone]   Fax:  [phone]  Name: Hulene Cambria MRN: [id number] Date of Birth: [birth year]

## 2019-10-23 ENCOUNTER — Ambulatory Visit: Payer: Medicare Other | Admitting: Physical Therapy

## 2019-10-25 ENCOUNTER — Ambulatory Visit: Payer: Medicare Other | Admitting: Physical Therapy

## 2019-10-25 ENCOUNTER — Encounter: Payer: Self-pay | Admitting: Physical Therapy

## 2019-10-25 ENCOUNTER — Other Ambulatory Visit: Payer: Self-pay

## 2019-10-25 DIAGNOSIS — M6281 Muscle weakness (generalized): Secondary | ICD-10-CM

## 2019-10-25 DIAGNOSIS — M25551 Pain in right hip: Secondary | ICD-10-CM | POA: Diagnosis not present

## 2019-10-25 DIAGNOSIS — M25651 Stiffness of right hip, not elsewhere classified: Secondary | ICD-10-CM

## 2019-10-25 DIAGNOSIS — R209 Unspecified disturbances of skin sensation: Secondary | ICD-10-CM

## 2019-10-25 NOTE — Therapy (Signed)
[place], [place], [address] Phone: [phone]   Fax:  [phone]  Physical Therapy Treatment  Patient Details  Name: Angie Cain MRN: [id number] Date of Birth: August 03, 1956 Referring Provider (PT): Dr. Larey Dresser    Encounter Date: 10/25/2019  PT End of Session - 10/25/19 1337    Visit Number  4    Number of Visits  12    Date for PT Re-Evaluation  11/16/19    Authorization Type  MCR/MCD    PT Start Time  0845    PT Stop Time  0923    PT Time Calculation (min)  38 min    Activity Tolerance  Patient tolerated treatment well    Behavior During Therapy  [facility] for tasks assessed/performed       Past Medical History:  Diagnosis Date  . Allergy   . Anxiety    occasional  . Arthritis   . Atherosclerotic peripheral vascular disease with intermittent claudication ([place])    s/p fem-fem bypass 2011. ABI 07/2012 0.5 R 0.65 L  . Diabetes mellitus without complication ([place])   . GERD (gastroesophageal reflux disease)   . Hyperlipidemia   . Hypertension   . IDDM (insulin dependent diabetes mellitus) 07/21/2007   July 2011: Femoral-femoral bypass  ABI 08/11/2012 notable for 0.4-0.59 on right ankle suggestive of severe arterial occlusive disease at rest and 0.60-0.7 on left ankle suggestive of moderate arterial occlusive disease at rest.   . Personal history of colonic polyps - adenomas 03/07/2014  . Tobacco abuse     Past Surgical History:  Procedure Laterality Date  . VAGINAL HYSTERECTOMY    . 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  Subjective Assessment - 10/25/19 0851    Subjective  Patient reports she woke up this morning in significant pain. She is a little better now that she has been up and moving. Overall she reports it has been pretty good. It is raining hard this morning.    Pertinent History  Rt arm pain as well, diabetic,  hypertension, heart disease, 2011 Fem-fem bypass    Limitations  Sitting;Standing;Walking    Diagnostic tests  08/15/19: Osteoarthritic changes of the hips with severe narrowing of the medial joint space    Patient Stated Goals  To have less pain, be more comfortable    Currently in Pain?  Yes    Pain Score  7     Pain Location  Hip    Pain Orientation  Right    Pain Descriptors / Indicators  Aching    Pain Type  Chronic pain    Pain Onset  More than a month ago    Pain Frequency  Intermittent    Aggravating Factors   mornings    Pain Relieving Factors  taking her time    Effect of Pain on Daily Activities  has to move slowly.                       [facility] Adult PT Treatment/Exercise - 10/25/19 0001      Knee/Hip Exercises: Supine   Quad Sets Limitations  x20     Other Supine Knee/Hip Exercises  supine clam shell no resistance x20     Other Supine Knee/Hip Exercises  glut set x20       Modalities   Modalities  Electrical Stimulation;Moist Heat  Moist Heat Therapy   Number Minutes Moist Heat  15 Minutes    Moist Heat Location  Lumbar Spine      Electrical Stimulation   Electrical Stimulation Location  right lumbar spine     Electrical Stimulation Action  IFC     Electrical Stimulation Parameters  to tolerance     Electrical Stimulation Goals  Pain      Manual Therapy   Manual Therapy  Joint mobilization;Soft tissue mobilization;Manual Traction    Joint Mobilization  posterior hip mobilization below 90 dgrees     Soft tissue mobilization  tirgger point release to glut medius, lumbar paraspinals, IT band;     Manual Traction  LAD to right leg with hip ER;             PT Education - 10/25/19 0853    Education Details  reviewed HEp and symptom mangement    Person(s) Educated  Patient    Methods  Explanation    Comprehension  Verbalized understanding;Returned demonstration;Verbal cues required;Tactile cues required          PT Long Term Goals -  10/25/19 1415      PT LONG TERM GOAL #1   Title  Pt will be I with HEP for trunk flexibility, hip and core strength    Time  6    Period  Weeks    Status  On-going      PT LONG TERM GOAL #2   Title  Pt will be able to wake up with min stiffness relieved with stretches, improved 50%    Time  6    Period  Weeks    Status  On-going      PT LONG TERM GOAL #3   Title  Pt will be able to demo hip strength increased by 1/2 muscle grade    Time  6    Period  Weeks    Status  On-going      PT LONG TERM GOAL #4   Title  Pt will be able to shop, walk for 45-60 min with no more than min pain in Rt hip (3/10)    Time  6    Period  Weeks    Status  On-going      PT LONG TERM GOAL #5   Title  Pt will show good form with lifting, squatting for home tasks such as vacuuming and changing lines, etc.    Time  6    Period  Weeks    Status  On-going            Plan - 10/25/19 1339    Clinical Impression Statement  Patient was limited today by pain. She reported 10/10 pain overnight and it was not much better this morning. She had significant tenderness to palpation in thre gluteals and lateral hip. She had a minor improvement in pain with manual therapy. Therapy also reviewed light exercises to do over the next few days. Therapy will progress her depending on how she feels on Monday.    Personal Factors and Comorbidities  Comorbidity 1;Comorbidity 2;Social Background;Time since onset of injury/illness/exacerbation    Comorbidities  diabetes, bypass surgery due to PVD,  myalgia    Examination-Activity Limitations  Bed Mobility;Reach Overhead;Squat;Stand;Lift;Locomotion Level    Examination-Participation Restrictions  Community Activity;Interpersonal Relationship;Shop;Laundry    Stability/Clinical Decision Making  Evolving/Moderate complexity    Clinical Decision Making  Moderate    Rehab Potential  Excellent    PT Frequency  2x /  week    PT Duration  6 weeks    PT Treatment/Interventions   ADLs/Self Care Home Management;Iontophoresis 4mg /ml Dexamethasone;Functional mobility training;Patient/family education;Moist Heat;Therapeutic activities;Ultrasound;Therapeutic exercise;Passive range of motion;Dry needling;Manual techniques;Cryotherapy;Electrical Stimulation;Neuromuscular re-education;Taping    PT Next Visit Plan  asses tolerance to new standing exercises. Advance exercises to both legs as tolerated. Continue with manual therapy    PT Home Exercise Plan  hamstring , hip flexor and knee to chest    Consulted and Agree with Plan of Care  Patient       Patient will benefit from skilled therapeutic intervention in order to improve the following deficits and impairments:  Decreased range of motion, Difficulty walking, Increased fascial restricitons, Pain, Impaired flexibility, Decreased mobility, Decreased strength, Impaired sensation  Visit Diagnosis: Pain in right hip  Stiffness of right hip, not elsewhere classified  Unspecified disturbances of skin sensation  Muscle weakness (generalized)     Problem List Patient Active Problem List   Diagnosis Date Noted  . Tinnitus 08/15/2019  . Osteoarthritis of right hip 08/15/2019  . Myalgia 08/15/2019  . Concern about disease without diagnosis 07/19/2019  . 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  . Hives 05/15/2018  . Abdominal pain 02/19/2018  . Allergic rhinitis 10/16/2017  . Major depression 10/16/2017  . Iron deficiency anemia 05/08/2017  . Healthcare maintenance 03/18/2017  . Eye discharge 07/31/2016  . Rotator cuff impingement syndrome of left shoulder 03/23/2016  . Osteopenia determined by x-ray 01/25/2015  . Bilateral plantar fasciitis 09/30/2014  . Hx of adenomatous colonic polyps 03/07/2014  . Low back pain 12/18/2013  . GERD (gastroesophageal reflux disease) 06/06/2013  . Hyperlipidemia associated with type 2 diabetes mellitus ([place]) 06/06/2013  . Tobacco use disorder 07/21/2007  . Essential hypertension  07/21/2007  . Insulin dependent type 2 diabetes mellitus, uncontrolled ([place]) 07/20/1993    Carney Living PT DPT  10/25/2019, 2:17 PM  [facility] [address] [place], [place], [address] Phone: [phone]   Fax:  [phone]  Name: Angie Cain MRN: [id number] Date of Birth: 30-Apr-1956

## 2019-10-30 ENCOUNTER — Ambulatory Visit: Payer: Medicare Other | Admitting: Physical Therapy

## 2019-10-30 ENCOUNTER — Encounter: Payer: Self-pay | Admitting: Physical Therapy

## 2019-10-30 ENCOUNTER — Other Ambulatory Visit: Payer: Self-pay

## 2019-10-30 DIAGNOSIS — R209 Unspecified disturbances of skin sensation: Secondary | ICD-10-CM

## 2019-10-30 DIAGNOSIS — M25651 Stiffness of right hip, not elsewhere classified: Secondary | ICD-10-CM

## 2019-10-30 DIAGNOSIS — M25551 Pain in right hip: Secondary | ICD-10-CM

## 2019-10-30 DIAGNOSIS — M6281 Muscle weakness (generalized): Secondary | ICD-10-CM

## 2019-10-30 NOTE — Therapy (Signed)
[place], [place], [address] Phone: [phone]   Fax:  [phone]  Physical Therapy Treatment  Patient Details  Name: Angie Cain MRN: [id number] Date of Birth: Jul 12, 1956 Referring Provider (PT): Dr. Larey Dresser    Encounter Date: 10/30/2019  PT End of Session - 10/30/19 1002    Visit Number  5    Number of Visits  12    Date for PT Re-Evaluation  11/16/19    Authorization Type  MCR/MCD    PT Start Time  0916    PT Stop Time  1016    PT Time Calculation (min)  60 min    Activity Tolerance  Patient tolerated treatment well    Behavior During Therapy  [facility] for tasks assessed/performed       Past Medical History:  Diagnosis Date  . Allergy   . Anxiety    occasional  . Arthritis   . Atherosclerotic peripheral vascular disease with intermittent claudication ([place])    s/p fem-fem bypass 2011. ABI 07/2012 0.5 R 0.65 L  . Diabetes mellitus without complication ([place])   . GERD (gastroesophageal reflux disease)   . Hyperlipidemia   . Hypertension   . IDDM (insulin dependent diabetes mellitus) 07/21/2007   July 2011: Femoral-femoral bypass  ABI 08/11/2012 notable for 0.4-0.59 on right ankle suggestive of severe arterial occlusive disease at rest and 0.60-0.7 on left ankle suggestive of moderate arterial occlusive disease at rest.   . Personal history of colonic polyps - adenomas 03/07/2014  . Tobacco abuse     Past Surgical History:  Procedure Laterality Date  . VAGINAL HYSTERECTOMY    . 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  Subjective Assessment - 10/30/19 0922    Subjective  Today its better than last week.  Both ankles hurt.   The pain causes restlessness, I just can't comfortable.    Currently in Pain?  Yes    Pain Score  4     Pain Location  Hip    Pain Orientation  Right    Pain Descriptors / Indicators   Nagging;Discomfort    Pain Type  Chronic pain    Pain Onset  More than a month ago    Pain Frequency  Intermittent    Aggravating Factors   morning, sleep    Pain Relieving Factors  moving slowly, warmer temps, heat    Multiple Pain Sites  Yes    Pain Score  10    Pain Location  Ankle    Pain Orientation  Right;Left    Pain Descriptors / Indicators  Aching    Pain Type  Chronic pain    Pain Onset  More than a month ago    Pain Frequency  Intermittent    Aggravating Factors   bedtime, laying down    Pain Relieving Factors  rubbing it        [facility] Adult PT Treatment/Exercise - 10/30/19 0001      Knee/Hip Exercises: Stretches   Active Hamstring Stretch  Right;2 reps    Active Hamstring Stretch Limitations  strap     Hip Flexor Stretch  Both;3 reps    Hip Flexor Stretch Limitations  standing     Piriformis Stretch  Both    Piriformis Stretch Limitations  press down on knee gently     Other Knee/Hip Stretches  knee to chest  x 3 each side       Knee/Hip Exercises: Aerobic   Nustep  L6 UE and LE x 6 min       Knee/Hip Exercises: Standing   Knee Flexion Limitations  standing march 2x10     Hip Abduction  Stengthening;Both;1 set;10 reps    Hip Extension  Stengthening;Both;1 set;10 reps    Functional Squat  1 set;10 reps    Functional Squat Limitations  mini squat at chair     Wall Squat  1 set;10 reps      Knee/Hip Exercises: Supine   Bridges  Strengthening;Both;1 set;10 reps    Other Supine Knee/Hip Exercises  hip ER/IR x 10 each side       Knee/Hip Exercises: Sidelying   Hip ABduction  Strengthening;Right;1 set;15 reps    Clams  x15 Rt LE only       Moist Heat Therapy   Number Minutes Moist Heat  15 Minutes    Moist Heat Location  Hip      Electrical Stimulation   Electrical Stimulation Location  Rt post lateral hip     Electrical Stimulation Action  IFC     Electrical Stimulation Parameters  to tol  , 20     Electrical Stimulation Goals  Pain              PT Education - 10/30/19 1001    Education Details  exercises as a ritual, consistency, referral for ortho due to pain in multi joints and ongoing hip OA    Person(s) Educated  Patient    Methods  Explanation    Comprehension  Verbalized understanding          PT Long Term Goals - 10/25/19 1415      PT LONG TERM GOAL #1   Title  Pt will be I with HEP for trunk flexibility, hip and core strength    Time  6    Period  Weeks    Status  On-going      PT LONG TERM GOAL #2   Title  Pt will be able to wake up with min stiffness relieved with stretches, improved 50%    Time  6    Period  Weeks    Status  On-going      PT LONG TERM GOAL #3   Title  Pt will be able to demo hip strength increased by 1/2 muscle grade    Time  6    Period  Weeks    Status  On-going      PT LONG TERM GOAL #4   Title  Pt will be able to shop, walk for 45-60 min with no more than min pain in Rt hip (3/10)    Time  6    Period  Weeks    Status  On-going      PT LONG TERM GOAL #5   Title  Pt will show good form with lifting, squatting for home tasks such as vacuuming and changing lines, etc.    Time  6    Period  Weeks    Status  On-going            Plan - 10/30/19 0926    Clinical Impression Statement  Patient with less pain overall, able to tolerate standing exercises despite bilateral ankle pain.  She does a good job of staying fairly active but does not do a structured routine.  I recommend she see an Orthopedic for her hip  and ankle, shoulder pain.  Her progress will be limited due to severity of arthritis. Repeat of IFC for pain relief.    PT Treatment/Interventions  ADLs/Self Care Home Management;Iontophoresis 4mg /ml Dexamethasone;Functional mobility training;Patient/family education;Moist Heat;Therapeutic activities;Ultrasound;Therapeutic exercise;Passive range of motion;Dry needling;Manual techniques;Cryotherapy;Electrical Stimulation;Neuromuscular re-education;Taping     PT Next Visit Plan  Assess tolerance to standing ex.  Continue with modalties as needed.    PT Home Exercise Plan  hamstring , hip flexor and knee to chest, hip ER/IR (supine) , LAQ ,  isometric clam, standing abd, ext and march    Consulted and Agree with Plan of Care  Patient       Patient will benefit from skilled therapeutic intervention in order to improve the following deficits and impairments:  Decreased range of motion, Difficulty walking, Increased fascial restricitons, Pain, Impaired flexibility, Decreased mobility, Decreased strength, Impaired sensation  Visit Diagnosis: Pain in right hip  Stiffness of right hip, not elsewhere classified  Unspecified disturbances of skin sensation  Muscle weakness (generalized)     Problem List Patient Active Problem List   Diagnosis Date Noted  . Tinnitus 08/15/2019  . Osteoarthritis of right hip 08/15/2019  . Myalgia 08/15/2019  . Concern about disease without diagnosis 07/19/2019  . 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  . Hives 05/15/2018  . Abdominal pain 02/19/2018  . Allergic rhinitis 10/16/2017  . Major depression 10/16/2017  . Iron deficiency anemia 05/08/2017  . Healthcare maintenance 03/18/2017  . Eye discharge 07/31/2016  . Rotator cuff impingement syndrome of left shoulder 03/23/2016  . Osteopenia determined by x-ray 01/25/2015  . Bilateral plantar fasciitis 09/30/2014  . Hx of adenomatous colonic polyps 03/07/2014  . Low back pain 12/18/2013  . GERD (gastroesophageal reflux disease) 06/06/2013  . Hyperlipidemia associated with type 2 diabetes mellitus ([place]) 06/06/2013  . Tobacco use disorder 07/21/2007  . Essential hypertension 07/21/2007  . Insulin dependent type 2 diabetes mellitus, uncontrolled ([place]) 07/20/1993    Ladarion Munyon 10/30/2019, 11:12 AM  [facility] [address] [place], [place], [address] Phone: [phone]   Fax:  [phone]  Name:  Angie Cain MRN: [id number] Date of Birth: February 11, 1956  Raeford Razor, PT 10/30/19 11:12 AM Phone: [phone] Fax: [phone]

## 2019-10-31 ENCOUNTER — Ambulatory Visit: Payer: Medicare Other | Admitting: Licensed Clinical Social Worker

## 2019-10-31 ENCOUNTER — Ambulatory Visit (INDEPENDENT_AMBULATORY_CARE_PROVIDER_SITE_OTHER): Payer: Medicare Other | Admitting: Internal Medicine

## 2019-10-31 ENCOUNTER — Encounter: Payer: Self-pay | Admitting: Internal Medicine

## 2019-10-31 VITALS — BP 162/73 | HR 72 | Temp 98.1°F | Ht 65.0 in | Wt 152.6 lb

## 2019-10-31 DIAGNOSIS — F17200 Nicotine dependence, unspecified, uncomplicated: Secondary | ICD-10-CM

## 2019-10-31 DIAGNOSIS — E1165 Type 2 diabetes mellitus with hyperglycemia: Secondary | ICD-10-CM

## 2019-10-31 DIAGNOSIS — Z794 Long term (current) use of insulin: Secondary | ICD-10-CM | POA: Diagnosis not present

## 2019-10-31 DIAGNOSIS — IMO0002 Reserved for concepts with insufficient information to code with codable children: Secondary | ICD-10-CM

## 2019-10-31 DIAGNOSIS — I10 Essential (primary) hypertension: Secondary | ICD-10-CM | POA: Diagnosis present

## 2019-10-31 DIAGNOSIS — M1611 Unilateral primary osteoarthritis, right hip: Secondary | ICD-10-CM

## 2019-10-31 MED ORDER — LOSARTAN POTASSIUM 25 MG PO TABS: 25.0000 mg | ORAL_TABLET | Freq: Every day | ORAL | 0 refills | Status: DC

## 2019-10-31 MED ORDER — NICOTINE 7 MG/24HR TD PT24: 7.0000 mg | MEDICATED_PATCH | Freq: Every day | TRANSDERMAL | 0 refills | Status: AC

## 2019-10-31 NOTE — Assessment & Plan Note (Addendum)
The patient's blood pressure during this visit was 162/73. The patient is currently taking amlodipine 5mg  qd. Her last blood pressure visits are   BP Readings from Last 3 Encounters:  10/31/19 (!) 162/73  09/12/19 (!) 141/70  08/15/19 (!) 145/70  Assessment and Plan The patient states that she has not been taking her amlodipine daily because it makes her lethargic. She is a diabetic and needs to be on acei/arb for renoprotection. She was unable to tolerate lisinopril previously as it caused her tingling around the mouth. She is willing to try arb.   -Will add losartan 25mg  qd -continue amlodipine 5mg  qd  -bmp

## 2019-10-31 NOTE — Patient Instructions (Addendum)
It was a pleasure to see you today [name]. Please make the following changes:  For your blood pressure  -please start taking losartan 25mg  daily   For your diabetes -continue taking lantus and janumet   For your hip pain Continue working with physical therapy  For your tobacco use -please start taking nicotine patch daily  Follow up in 6 weeks  If you have any questions or concerns, please call our clinic at [phone] between 9am-5pm and after hours call [phone] and ask for the internal medicine resident on call. If you feel you are having a medical emergency please call 911.   Thank you, we look forward to help you remain healthy!  Angie Mage, MD Internal Medicine PGY3

## 2019-10-31 NOTE — Progress Notes (Signed)
   CC: Diabetes follow up   HPI:  [name] is a 63 y.o. female with essential htn, iddm, iron deficiency anemia who presents for diabetes follow up. Please see problem based charting for evaluation, assessment, and plan.  Past Medical History:  Diagnosis Date  . Allergy   . Anxiety    occasional  . Arthritis   . Atherosclerotic peripheral vascular disease with intermittent claudication ([place])    s/p fem-fem bypass 2011. ABI 07/2012 0.5 R 0.65 L  . Diabetes mellitus without complication ([place])   . GERD (gastroesophageal reflux disease)   . Hyperlipidemia   . Hypertension   . IDDM (insulin dependent diabetes mellitus) 07/21/2007   July 2011: Femoral-femoral bypass  ABI 08/11/2012 notable for 0.4-0.59 on right ankle suggestive of severe arterial occlusive disease at rest and 0.60-0.7 on left ankle suggestive of moderate arterial occlusive disease at rest.   . Personal history of colonic polyps - adenomas 03/07/2014  . Tobacco abuse    Review of Systems:    Review of Systems  Constitutional: Negative for chills, fever and weight loss.  HENT: Positive for tinnitus. Negative for ear pain.   Eyes: Negative for blurred vision.  Respiratory: Negative for cough and shortness of breath.   Cardiovascular: Negative for chest pain and palpitations.  Gastrointestinal: Positive for constipation and nausea. Negative for abdominal pain, diarrhea and vomiting.  Neurological: Positive for headaches. Negative for dizziness.   Physical Exam:  Vitals:   10/31/19 1608  BP: (!) 162/73  Pulse: 72  Temp: 98.1 F (36.7 C)  TempSrc: Oral  SpO2: 100%  Weight: 152 lb 9.6 oz (69.2 kg)  Height: 5\' 5"  (1.651 m)   Physical Exam  Constitutional: Appears well-developed and well-nourished. No distress.  HENT:  Head: Normocephalic and atraumatic.  Eyes: Conjunctivae are normal.  Cardiovascular: Normal rate, regular rhythm and normal heart sounds.  Respiratory: Effort normal and breath sounds  normal. No respiratory distress. No wheezes.  GI: Soft. Bowel sounds are normal. No distension. There is no tenderness.  Musculoskeletal: No edema.  Neurological: Is alert.  Skin: Not diaphoretic. No erythema.  Psychiatric: Normal mood and affect. Behavior is normal. Judgment and thought content normal.    Assessment & Plan:   See Encounters Tab for problem based charting.  Patient discussed with Dr. Rebeca Alert

## 2019-10-31 NOTE — Assessment & Plan Note (Signed)
The patient's last a1c=7.9 on 08/15/19. The patient's home blood glucose measurements over the past month have ranged 70-263. The patient does/does not note episodes of hypoglycemia.   The patient is currently taking lantus 32u qd, janumet 50-1000mg  bid. The patient is compliant with medication. Patient has lost 2lbs in the past 2 months.   Assessment and plan  The patient's blood glucose are at a good range. Continue current medication regimen.

## 2019-11-01 ENCOUNTER — Encounter: Payer: Self-pay | Admitting: Physical Therapy

## 2019-11-01 ENCOUNTER — Ambulatory Visit: Payer: Medicare Other | Admitting: Physical Therapy

## 2019-11-01 ENCOUNTER — Other Ambulatory Visit: Payer: Self-pay

## 2019-11-01 DIAGNOSIS — M25551 Pain in right hip: Secondary | ICD-10-CM | POA: Diagnosis not present

## 2019-11-01 DIAGNOSIS — M25651 Stiffness of right hip, not elsewhere classified: Secondary | ICD-10-CM

## 2019-11-01 DIAGNOSIS — R209 Unspecified disturbances of skin sensation: Secondary | ICD-10-CM

## 2019-11-01 DIAGNOSIS — M6281 Muscle weakness (generalized): Secondary | ICD-10-CM

## 2019-11-01 LAB — BMP8+ANION GAP
Anion Gap: 15 mmol/L (ref 10.0–18.0)
BUN/Creatinine Ratio: 20 (ref 12–28)
BUN: 15 mg/dL (ref 8–27)
CO2: 23 mmol/L (ref 20–29)
Calcium: 9.8 mg/dL (ref 8.7–10.3)
Chloride: 105 mmol/L (ref 96–106)
Creatinine, Ser: 0.75 mg/dL (ref 0.57–1.00)
GFR calc Af Amer: 97 mL/min/{1.73_m2} (ref >59)
GFR calc non Af Amer: 84 mL/min/{1.73_m2} (ref >59)
Glucose: 175 mg/dL — ABNORMAL HIGH (ref 65–99)
Potassium: 4 mmol/L (ref 3.5–5.2)
Sodium: 143 mmol/L (ref 134–144)

## 2019-11-01 NOTE — Therapy (Signed)
[place], [place], [address] Phone: [phone]   Fax:  [phone]  Physical Therapy Treatment  Patient Details  Name: Angie Cain MRN: [id number] Date of Birth: 1956-06-21 Referring Provider (PT): Dr. Larey Dresser    Encounter Date: 11/01/2019  PT End of Session - 11/01/19 0925    Visit Number  6    Number of Visits  12    Date for PT Re-Evaluation  11/16/19    Authorization Type  MCR/MCD    PT Start Time  0845    PT Stop Time  0927    PT Time Calculation (min)  42 min    Activity Tolerance  Patient tolerated treatment well    Behavior During Therapy  [facility] for tasks assessed/performed       Past Medical History:  Diagnosis Date  . Allergy   . Anxiety    occasional  . Arthritis   . Atherosclerotic peripheral vascular disease with intermittent claudication ([place])    s/p fem-fem bypass 2011. ABI 07/2012 0.5 R 0.65 L  . Diabetes mellitus without complication ([place])   . GERD (gastroesophageal reflux disease)   . Hyperlipidemia   . Hypertension   . IDDM (insulin dependent diabetes mellitus) 07/21/2007   July 2011: Femoral-femoral bypass  ABI 08/11/2012 notable for 0.4-0.59 on right ankle suggestive of severe arterial occlusive disease at rest and 0.60-0.7 on left ankle suggestive of moderate arterial occlusive disease at rest.   . Personal history of colonic polyps - adenomas 03/07/2014  . Tobacco abuse     Past Surgical History:  Procedure Laterality Date  . VAGINAL HYSTERECTOMY    . 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  Subjective Assessment - 11/01/19 0852    Subjective  Patient reports it has not been too bad today. It was just achy when she woke up.    Pertinent History  Rt arm pain as well, diabetic, hypertension, heart disease, 2011 Fem-fem bypass    Limitations  Sitting;Standing;Walking    Diagnostic tests   08/15/19: Osteoarthritic changes of the hips with severe narrowing of the medial joint space    Patient Stated Goals  To have less pain, be more comfortable    Currently in Pain?  Yes    Pain Score  3     Pain Location  Hip    Pain Orientation  Right    Pain Descriptors / Indicators  Aching;Tightness    Pain Type  Chronic pain    Pain Onset  More than a month ago    Pain Frequency  Intermittent    Aggravating Factors   morning    Pain Relieving Factors  moves slowly    Effect of Pain on Daily Activities  certain movements hurt    Multiple Pain Sites  No                       [facility] Adult PT Treatment/Exercise - 11/01/19 0001      Knee/Hip Exercises: Stretches   Active Hamstring Stretch  Right;2 reps    Active Hamstring Stretch Limitations  strap     Piriformis Stretch  Both    Piriformis Stretch Limitations  press down on knee gently     Other Knee/Hip Stretches  knee to chest x 3 each side       Knee/Hip Exercises: Aerobic  Nustep  L6 UE and LE x 6 min       Knee/Hip Exercises: Standing   Knee Flexion Limitations  standing march 2x10     Hip Abduction  Stengthening;Both;1 set;10 reps    Hip Extension  Stengthening;Both;1 set;10 reps    Functional Squat  --    Functional Squat Limitations  --      Knee/Hip Exercises: Supine   Bridges  Strengthening;Both;1 set;10 reps    Other Supine Knee/Hip Exercises  low range supoine march       Moist Heat Therapy   Number Minutes Moist Heat  --      Electrical Stimulation   Electrical Stimulation Location  --   patient declined    Printmaker Action  --    Electrical Stimulation Parameters  --    Electrical Stimulation Goals  --      Manual Therapy   Joint Mobilization  posterior hip mobilization below 90 dgrees     Manual Traction  LAD to right leg with hip ER;             PT Education - 11/01/19 0855    Education Details  reviewed HEP and symptom mangement    Person(s) Educated  Patient     Methods  Explanation;Demonstration;Tactile cues;Verbal cues    Comprehension  Verbalized understanding;Returned demonstration;Verbal cues required;Tactile cues required          PT Long Term Goals - 11/01/19 1443      PT LONG TERM GOAL #1   Title  Pt will be I with HEP for trunk flexibility, hip and core strength    Time  6    Period  Weeks    Status  On-going      PT LONG TERM GOAL #2   Title  Pt will be able to wake up with min stiffness relieved with stretches, improved 50%    Time  6    Period  Weeks    Status  On-going      PT LONG TERM GOAL #3   Title  Pt will be able to demo hip strength increased by 1/2 muscle grade    Time  6    Period  Weeks    Status  On-going      PT LONG TERM GOAL #4   Title  Pt will be able to shop, walk for 45-60 min with no more than min pain in Rt hip (3/10)    Time  6    Period  Weeks    Status  On-going      PT LONG TERM GOAL #5   Title  Pt will show good form with lifting, squatting for home tasks such as vacuuming and changing lines, etc.    Time  6    Period  Weeks    Status  On-going            Plan - 11/01/19 1047    Clinical Impression Statement  Patient reports she has had less pain in the mornings Hetr C/O at this time is pain in her elbow. Therapy advised her she would need a seperate script for that. Therapy worked on [employer] mobilization to improve movement and spasce in the hip. She tolerated ther-ex well. She has beecome more consistent with her ther-ex at home. She has several sheets for home. Therapy talked to her abput next visit or the visit after. Putting it all together on one sheet. She was advised to really work  on her stretching and see which ones ar emost beneficial for her. She may benefit from further therapy for 3-4 more weeks. We talked about doing 1x a week.    Personal Factors and Comorbidities  Comorbidity 1;Comorbidity 2;Social Background;Time since onset of injury/illness/exacerbation    Comorbidities   diabetes, bypass surgery due to PVD,  myalgia    Examination-Activity Limitations  Bed Mobility;Reach Overhead;Squat;Stand;Lift;Locomotion Level    Stability/Clinical Decision Making  Evolving/Moderate complexity    Clinical Decision Making  Moderate    Rehab Potential  Excellent    PT Frequency  2x / week    PT Duration  6 weeks    PT Treatment/Interventions  ADLs/Self Care Home Management;Iontophoresis 4mg /ml Dexamethasone;Functional mobility training;Patient/family education;Moist Heat;Therapeutic activities;Ultrasound;Therapeutic exercise;Passive range of motion;Dry needling;Manual techniques;Cryotherapy;Electrical Stimulation;Neuromuscular re-education;Taping    PT Next Visit Plan  Assess tolerance to standing ex.  Continue with modalties as needed.    PT Home Exercise Plan  hamstring , hip flexor and knee to chest, hip ER/IR (supine) , LAQ ,  isometric clam, standing abd, ext and march    Consulted and Agree with Plan of Care  Patient       Patient will benefit from skilled therapeutic intervention in order to improve the following deficits and impairments:  Decreased range of motion, Difficulty walking, Increased fascial restricitons, Pain, Impaired flexibility, Decreased mobility, Decreased strength, Impaired sensation  Visit Diagnosis: Pain in right hip  Stiffness of right hip, not elsewhere classified  Unspecified disturbances of skin sensation  Muscle weakness (generalized)     Problem List Patient Active Problem List   Diagnosis Date Noted  . Tinnitus 08/15/2019  . Osteoarthritis of right hip 08/15/2019  . Myalgia 08/15/2019  . Concern about disease without diagnosis 07/19/2019  . 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  . Hives 05/15/2018  . Abdominal pain 02/19/2018  . Allergic rhinitis 10/16/2017  . Major depression 10/16/2017  . Iron deficiency anemia 05/08/2017  . Healthcare maintenance 03/18/2017  . Eye discharge 07/31/2016  . Rotator cuff impingement  syndrome of left shoulder 03/23/2016  . Osteopenia determined by x-ray 01/25/2015  . Bilateral plantar fasciitis 09/30/2014  . Hx of adenomatous colonic polyps 03/07/2014  . Low back pain 12/18/2013  . GERD (gastroesophageal reflux disease) 06/06/2013  . Hyperlipidemia associated with type 2 diabetes mellitus ([place]) 06/06/2013  . Tobacco use disorder 07/21/2007  . Essential hypertension 07/21/2007  . Insulin dependent type 2 diabetes mellitus, uncontrolled ([place]) 07/20/1993    Carney Living PT DPT  11/01/2019, 2:44 PM  [facility] [address] [place], [place], [address] Phone: [phone]   Fax:  [phone]  Name: Angie Cain MRN: [id number] Date of Birth: [birth year]

## 2019-11-02 NOTE — Assessment & Plan Note (Signed)
Patient continues to smoke 4-5 cigarettes per day. She stated that she would like to quit. She did not like that bupropion made her lethargic. However, she was agreeable to trying nicotine patches.   -7mg  nicotine patch qd prescribed

## 2019-11-02 NOTE — Assessment & Plan Note (Signed)
The patient states that she continues to have some discomfort in her right hip. She is following with physical therapy regularly. Per physical therapy report they recommend the patient be evaluated by orthopedics since she still does not have any pain relief from PT although she is doing the exercises regularly.   -referral to orthopedics

## 2019-11-03 NOTE — Progress Notes (Signed)
Internal Medicine Clinic Attending  Case discussed with Dr. Chundi at the time of the visit.  We reviewed the resident's history and exam and pertinent patient test results.  I agree with the assessment, diagnosis, and plan of care documented in the resident's note.  Alexander Raines, M.D., Ph.D.  

## 2019-11-06 ENCOUNTER — Ambulatory Visit: Payer: Medicare Other | Admitting: Physical Therapy

## 2019-11-06 ENCOUNTER — Other Ambulatory Visit: Payer: Self-pay

## 2019-11-06 ENCOUNTER — Encounter: Payer: Self-pay | Admitting: Physical Therapy

## 2019-11-06 DIAGNOSIS — M25551 Pain in right hip: Secondary | ICD-10-CM

## 2019-11-06 DIAGNOSIS — M25651 Stiffness of right hip, not elsewhere classified: Secondary | ICD-10-CM

## 2019-11-06 DIAGNOSIS — M6281 Muscle weakness (generalized): Secondary | ICD-10-CM

## 2019-11-06 DIAGNOSIS — R209 Unspecified disturbances of skin sensation: Secondary | ICD-10-CM

## 2019-11-06 NOTE — Therapy (Addendum)
[place], [place], [address] Phone: [phone]   Fax:  [phone]  Physical Therapy Treatment/Discharge   Patient Details  Name: Angie Cain MRN: [id number] Date of Birth: 01-Aug-1956 Referring Provider (PT): Dr. Larey Dresser    Encounter Date: 11/06/2019  PT End of Session - 11/06/19 1051    Visit Number  7    Number of Visits  12    Date for PT Re-Evaluation  11/16/19    Authorization Type  MCR/MCD    PT Start Time  1000    PT Stop Time  1045    PT Time Calculation (min)  45 min    Activity Tolerance  Patient tolerated treatment well    Behavior During Therapy  [facility] for tasks assessed/performed       Past Medical History:  Diagnosis Date  . Allergy   . Anxiety    occasional  . Arthritis   . Atherosclerotic peripheral vascular disease with intermittent claudication ([place])    s/p fem-fem bypass 2011. ABI 07/2012 0.5 R 0.65 L  . Diabetes mellitus without complication ([place])   . GERD (gastroesophageal reflux disease)   . Hyperlipidemia   . Hypertension   . IDDM (insulin dependent diabetes mellitus) 07/21/2007   July 2011: Femoral-femoral bypass  ABI 08/11/2012 notable for 0.4-0.59 on right ankle suggestive of severe arterial occlusive disease at rest and 0.60-0.7 on left ankle suggestive of moderate arterial occlusive disease at rest.   . Personal history of colonic polyps - adenomas 03/07/2014  . Tobacco abuse     Past Surgical History:  Procedure Laterality Date  . VAGINAL HYSTERECTOMY    . 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  Subjective Assessment - 11/06/19 1005    Subjective  Mornings are rough somedays.  No pain right now.    Currently in Pain?  No/denies         [facility] Adult PT Treatment/Exercise - 11/06/19 0001      Self-Care   Other Self-Care Comments   stretches for long drive (hip)       Knee/Hip  Exercises: Stretches   Active Hamstring Stretch  Right;2 reps    Active Hamstring Stretch Limitations  strap     Piriformis Stretch  Both;3 reps;30 seconds    Piriformis Stretch Limitations  press down on knee gently     Other Knee/Hip Stretches  seated hip stretches x 2     Other Knee/Hip Stretches  LTR with wide knees for hip ER/IR x 10 each       Knee/Hip Exercises: Aerobic   Nustep  L6 LE x 6 min       Knee/Hip Exercises: Standing   Hip Abduction  Stengthening;Both;1 set;15 reps    Abduction Limitations  red    Hip Extension  Stengthening;Both;1 set;10 reps    Extension Limitations  red band at sink     Functional Squat  2 sets;15 reps    Functional Squat Limitations  wide and deep for ROM, then min UE assist for 2nd set       Knee/Hip Exercises: Supine   Bridges  Strengthening;Both;2 sets;10 reps                  PT Long Term Goals - 11/06/19 1051      PT LONG TERM GOAL #1   Title  Pt will be I  with HEP for trunk flexibility, hip and core strength    Baseline  up to date    Status  On-going      PT LONG TERM GOAL #2   Title  Pt will be able to wake up with min stiffness relieved with stretches, improved 50%    Baseline  still a big issue    Status  On-going      PT LONG TERM GOAL #3   Title  Pt will be able to demo hip strength increased by 1/2 muscle grade    Status  Unable to assess      PT LONG TERM GOAL #4   Title  Pt will be able to shop, walk for 45-60 min with no more than min pain in Rt hip (3/10)    Baseline  improving, 30 min    Status  On-going      PT LONG TERM GOAL #5   Title  Pt will show good form with lifting, squatting for home tasks such as vacuuming and changing lines, etc.    Status  Achieved            Plan - 11/06/19 1021    Clinical Impression Statement  Pt doing better.  She is planning to drive to [place] AM, about 7 hours.  Showed her some stretches to do on rest breaks (hip flexor tightness) .  She tolerates  exercises well in standing.    PT Treatment/Interventions  ADLs/Self Care Home Management;Iontophoresis 18m/ml Dexamethasone;Functional mobility training;Patient/family education;Moist Heat;Therapeutic activities;Ultrasound;Therapeutic exercise;Passive range of motion;Dry needling;Manual techniques;Cryotherapy;Electrical Stimulation;Neuromuscular re-education;Taping    PT Next Visit Plan  Assess tolerance to standing ex.  Continue with modalties as needed.    PT Home Exercise Plan  hamstring , hip flexor and knee to chest, hip ER/IR (supine) , LAQ ,  isometric clam, standing abd, ext and march    Consulted and Agree with Plan of Care  Patient       Patient will benefit from skilled therapeutic intervention in order to improve the following deficits and impairments:  Decreased range of motion, Difficulty walking, Increased fascial restricitons, Pain, Impaired flexibility, Decreased mobility, Decreased strength, Impaired sensation  Visit Diagnosis: Pain in right hip  Stiffness of right hip, not elsewhere classified  Unspecified disturbances of skin sensation  Muscle weakness (generalized)     Problem List Patient Active Problem List   Diagnosis Date Noted  . Tinnitus 08/15/2019  . Osteoarthritis of right hip 08/15/2019  . Myalgia 08/15/2019  . Concern about disease without diagnosis 07/19/2019  . 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  . Hives 05/15/2018  . Abdominal pain 02/19/2018  . Allergic rhinitis 10/16/2017  . Major depression 10/16/2017  . Iron deficiency anemia 05/08/2017  . Healthcare maintenance 03/18/2017  . Eye discharge 07/31/2016  . Rotator cuff impingement syndrome of left shoulder 03/23/2016  . Osteopenia determined by x-ray 01/25/2015  . Bilateral plantar fasciitis 09/30/2014  . Hx of adenomatous colonic polyps 03/07/2014  . Low back pain 12/18/2013  . GERD (gastroesophageal reflux disease) 06/06/2013  . Hyperlipidemia associated with type 2 diabetes  m[place]itus [place] 06/06/2013  . Tobacco use disorder 07/21/2007  . Essential hypertension 07/21/2007  . Insulin dependent type 2 diabetes mellitus, uncontrolled ([place] 07/20/1993    Aristide Waggle 11/06/2019, 12:56 PM  [facility] [address]Phone: [phone]  Fax:  [phone] Name: Angie OdonnelMRN: [id number] of Birth: 619-Dec-1957 JRaeford Razor PT  11/06/19 12:56 PM Phone: [phone] Fax: [phone]   PHYSICAL THERAPY DISCHARGE SUMMARY  Visits from Start of Care: 7  Current functional level related to goals / functional outcomes: See above    Remaining deficits: Pain, weakness, AROM, see above for most recent info    Education / Equipment: HEP, RICE, PNE Plan: Patient agrees to discharge.  Patient goals were not met. Patient is being discharged due to not returning since the last visit.  ?????    Raeford Razor, PT 12/25/19 9:07 AM Phone: [phone] Fax: [phone]

## 2019-11-21 ENCOUNTER — Other Ambulatory Visit: Payer: Self-pay

## 2019-11-21 ENCOUNTER — Encounter: Payer: Self-pay | Admitting: Licensed Clinical Social Worker

## 2019-11-21 ENCOUNTER — Ambulatory Visit (INDEPENDENT_AMBULATORY_CARE_PROVIDER_SITE_OTHER): Payer: Medicare Other | Admitting: Licensed Clinical Social Worker

## 2019-11-21 DIAGNOSIS — F329 Major depressive disorder, single episode, unspecified: Secondary | ICD-10-CM

## 2019-11-21 DIAGNOSIS — F32A Depression, unspecified: Secondary | ICD-10-CM

## 2019-11-21 NOTE — BH Specialist Note (Signed)
Integrated Behavioral Health Visit In person  11/21/2019 Angie Cain [id number]   Session Start time: 9:10  Session End time: 10:00 Total time: 50  minutes  Referring Provider: Dr. Maricela Bo  Discussed confidentiality: Yes   PRESENTING CONCERNS: Patient and/or family reports the following symptoms/concerns: mild depression and interpersonal conflict/stressors.  Duration of problem: increased over the past several months; Severity of problem: mild  STRENGTHS (Protective Factors/Coping Skills): Self-awareness  GOALS ADDRESSED: Patient will: 1.  Reduce symptoms of: depression and stress  2.  Increase knowledge and/or ability of: coping skills, healthy habits and stress reduction  3.  Demonstrate ability to: Increase healthy adjustment to current life circumstances and Increase adequate support systems for patient/family  INTERVENTIONS: Interventions utilized:  Brief CBT and Supportive Counseling Standardized Assessments completed: assessed for SI, HI, and self-harm.  ASSESSMENT: Patient currently experiencing mild levels of depression and stress. Patient identified triggers that lead to periods of sadness and frustration. Patient has healthy levels of self-awareness, and identified goals for the future to increase happiness and independence.  Patient identified triggers within her family dynamics, and identified personal goals to work towards personal happiness. Patient plans to move in the future, and processed necessary steps to make this happen.   Patient may benefit from counseling.  PLAN: 1. Follow up with behavioral health clinician on : one week.  Angie Cain, [facility], [place]

## 2019-11-22 ENCOUNTER — Other Ambulatory Visit: Payer: Self-pay | Admitting: Internal Medicine

## 2019-11-22 DIAGNOSIS — E785 Hyperlipidemia, unspecified: Secondary | ICD-10-CM

## 2019-11-22 DIAGNOSIS — IMO0002 Reserved for concepts with insufficient information to code with codable children: Secondary | ICD-10-CM

## 2019-11-22 DIAGNOSIS — E1165 Type 2 diabetes mellitus with hyperglycemia: Secondary | ICD-10-CM

## 2019-11-30 ENCOUNTER — Ambulatory Visit: Payer: Medicare Other | Admitting: Licensed Clinical Social Worker

## 2019-12-05 ENCOUNTER — Encounter: Payer: Self-pay | Admitting: Internal Medicine

## 2019-12-05 ENCOUNTER — Other Ambulatory Visit: Payer: Self-pay

## 2019-12-05 ENCOUNTER — Ambulatory Visit (INDEPENDENT_AMBULATORY_CARE_PROVIDER_SITE_OTHER): Payer: Medicare Other | Admitting: Internal Medicine

## 2019-12-05 VITALS — BP 143/71 | HR 72 | Temp 99.2°F | Ht 65.0 in | Wt 152.3 lb

## 2019-12-05 DIAGNOSIS — Z794 Long term (current) use of insulin: Secondary | ICD-10-CM | POA: Diagnosis not present

## 2019-12-05 DIAGNOSIS — E1165 Type 2 diabetes mellitus with hyperglycemia: Secondary | ICD-10-CM

## 2019-12-05 DIAGNOSIS — IMO0002 Reserved for concepts with insufficient information to code with codable children: Secondary | ICD-10-CM

## 2019-12-05 DIAGNOSIS — I10 Essential (primary) hypertension: Secondary | ICD-10-CM | POA: Diagnosis present

## 2019-12-05 LAB — POCT GLYCOSYLATED HEMOGLOBIN (HGB A1C): Hemoglobin A1C: 8.7 % — AB (ref 4.0–5.6)

## 2019-12-05 LAB — GLUCOSE, CAPILLARY: Glucose-Capillary: 131 mg/dL — ABNORMAL HIGH (ref 70–99)

## 2019-12-05 MED ORDER — LOSARTAN POTASSIUM 50 MG PO TABS: 50.0000 mg | ORAL_TABLET | Freq: Every day | ORAL | 1 refills | Status: DC

## 2019-12-05 NOTE — Progress Notes (Signed)
   CC: HTN  HPI:AngieAngie Cain is a 63 y.o. female who presents for evaluation of HTN. Please see individual problem based A/P for details.  Past Medical History:  Diagnosis Date  . Allergy   . Anxiety    occasional  . Arthritis   . Atherosclerotic peripheral vascular disease with intermittent claudication ([place])    s/p fem-fem bypass 2011. ABI 07/2012 0.5 R 0.65 L  . Diabetes mellitus without complication ([place])   . GERD (gastroesophageal reflux disease)   . Hyperlipidemia   . Hypertension   . IDDM (insulin dependent diabetes mellitus) 07/21/2007   July 2011: Femoral-femoral bypass  ABI 08/11/2012 notable for 0.4-0.59 on right ankle suggestive of severe arterial occlusive disease at rest and 0.60-0.7 on left ankle suggestive of moderate arterial occlusive disease at rest.   . Personal history of colonic polyps - adenomas 03/07/2014  . Tobacco abuse    Review of Systems:  ROS negative except as per HPI.  Physical Exam: Vitals:   12/05/19 1500  BP: (!) 143/71  Pulse: 72  Temp: 99.2 F (37.3 C)  TempSrc: Oral  SpO2: 100%  Weight: 152 lb 4.8 oz (69.1 kg)  Height: 5\' 5"  (1.651 m)   General: A/O x4, in no acute distress, afebrile, nondiaphoretic HEENT: PEERL, EMO intact Cardio: RRR, no mrg's  Pulmonary: CTA bilaterally, no wheezing or crackles  Abdomen: Bowel sounds normal, soft, nontender  MSK: BLE nontender, nonedematous Psych: Appropriate affect, not depressed in appearance, engages well  Assessment & Plan:   See Encounters Tab for problem based charting.  Patient discussed with Dr. Evette Cain

## 2019-12-05 NOTE — Assessment & Plan Note (Signed)
  DMII: Hgb A1c 7.9 % at the last visit. Current A1c 8.7%. The patient denied polyuria, polydipsia, headache, fatigue, confusion, nausea, vomiting or diaphoresis. She has been experiencing occasional headaches from time to time that she feels might be related to her many medication changes. As such, she would like to make no medication changes today. She has not been injecting in to the abdominal area as often and may not be delivering all of the mediation but does describe appropriate technique.   Plan: Continue lantus 32U nightly Continue Janumet 50-1000 Repeat A1c at next visit Consider adding an SGLT-2 or GLP1 agonist. Xultophy vs Soliqua may be options that the patient could be amenable to

## 2019-12-05 NOTE — Assessment & Plan Note (Signed)
Hypertension: Patient's BP today is 143/71 with a goal of <140/80. The patient endorses adherence to her medication regimen. She denied, chest pain, headache, visual changes, lightheadedness, weakness, dizziness on standing, swelling in the feet or ankles.   Plan: Continue amlodipine 5mg  daily Continue losartan but increased to 50mg  daily BMP TODAY

## 2019-12-05 NOTE — Patient Instructions (Signed)
FOLLOW-UP INSTRUCTIONS When: 4-6 wks For: Routine and lab check What to bring: All of your medications  For you diabetes please continue Lantus 32 units nightly and the Janumet 50-1000 mg tablets 2 times daily.  I will notify you if any changes should be made based on her A1c from today. For your high blood pressure please continue loaded pain 5 mg daily and increase the losartan from 25 mg daily to 50 mg daily. You would need to return in 4 to 6 weeks for a repeat lab and blood pressure check to make certain we are at a stable level.  Thank you for your visit to the [facility] today. If you have any questions or concerns please call us at [phone].

## 2019-12-06 LAB — BMP8+ANION GAP
Anion Gap: 16 mmol/L (ref 10.0–18.0)
BUN/Creatinine Ratio: 19 (ref 12–28)
BUN: 14 mg/dL (ref 8–27)
CO2: 23 mmol/L (ref 20–29)
Calcium: 10 mg/dL (ref 8.7–10.3)
Chloride: 102 mmol/L (ref 96–106)
Creatinine, Ser: 0.73 mg/dL (ref 0.57–1.00)
GFR calc Af Amer: 100 mL/min/{1.73_m2} (ref >59)
GFR calc non Af Amer: 87 mL/min/{1.73_m2} (ref >59)
Glucose: 129 mg/dL — ABNORMAL HIGH (ref 65–99)
Potassium: 3.7 mmol/L (ref 3.5–5.2)
Sodium: 141 mmol/L (ref 134–144)

## 2019-12-11 NOTE — Progress Notes (Signed)
Internal Medicine Clinic Attending  Case discussed with Dr. Harbrecht at the time of the visit.  We reviewed the resident's history and exam and pertinent patient test results.  I agree with the assessment, diagnosis, and plan of care documented in the resident's note.   

## 2020-01-10 ENCOUNTER — Ambulatory Visit: Payer: Medicare Other | Attending: Internal Medicine

## 2020-01-10 DIAGNOSIS — Z20822 Contact with and (suspected) exposure to covid-19: Secondary | ICD-10-CM

## 2020-01-11 LAB — NOVEL CORONAVIRUS, NAA: SARS-CoV-2, NAA: NOT DETECTED

## 2020-01-12 ENCOUNTER — Telehealth: Payer: Self-pay | Admitting: General Practice

## 2020-01-12 NOTE — Telephone Encounter (Signed)
Gave patient negative covid test results Patient understood 

## 2020-01-22 ENCOUNTER — Ambulatory Visit: Payer: Medicare Other | Attending: Internal Medicine

## 2020-01-22 DIAGNOSIS — Z23 Encounter for immunization: Secondary | ICD-10-CM | POA: Insufficient documentation

## 2020-01-22 NOTE — Progress Notes (Signed)
   Covid-19 Vaccination Clinic  Name:  Aradhana Lillo    MRN: [id number] DOB: 1956-02-23  01/22/2020  Ms. Gadberry was observed post Covid-19 immunization for 15 minutes without incidence. She was provided with Vaccine Information Sheet and instruction to access the V-Safe system.   Ms. Mohn was instructed to call 911 with any severe reactions post vaccine: Marland Kitchen Difficulty breathing  . Swelling of your face and throat  . A fast heartbeat  . A bad rash all over your body  . Dizziness and weakness    Immunizations Administered    Name Date Dose VIS Date Route   Pfizer COVID-19 Vaccine 01/22/2020  1:58 PM 0.3 mL 11/24/2019 Intramuscular   Manufacturer: [place]   Lot: [id number]   [place]: [id number]

## 2020-01-24 ENCOUNTER — Other Ambulatory Visit: Payer: Self-pay | Admitting: Internal Medicine

## 2020-01-24 DIAGNOSIS — E1165 Type 2 diabetes mellitus with hyperglycemia: Secondary | ICD-10-CM

## 2020-01-24 DIAGNOSIS — IMO0002 Reserved for concepts with insufficient information to code with codable children: Secondary | ICD-10-CM

## 2020-02-13 ENCOUNTER — Other Ambulatory Visit: Payer: Self-pay | Admitting: *Deleted

## 2020-02-13 DIAGNOSIS — D509 Iron deficiency anemia, unspecified: Secondary | ICD-10-CM

## 2020-02-13 DIAGNOSIS — E1165 Type 2 diabetes mellitus with hyperglycemia: Secondary | ICD-10-CM

## 2020-02-13 DIAGNOSIS — IMO0002 Reserved for concepts with insufficient information to code with codable children: Secondary | ICD-10-CM

## 2020-02-13 DIAGNOSIS — I10 Essential (primary) hypertension: Secondary | ICD-10-CM

## 2020-02-15 MED ORDER — LANTUS SOLOSTAR 100 UNIT/ML ~~LOC~~ SOPN: 32.0000 [IU] | PEN_INJECTOR | Freq: Every day | SUBCUTANEOUS | 1 refills | Status: DC

## 2020-02-15 MED ORDER — UNIFINE PENTIPS 31G X 5 MM MISC: 1 refills | Status: DC

## 2020-02-15 MED ORDER — AMLODIPINE BESYLATE 5 MG PO TABS: 5.0000 mg | ORAL_TABLET | Freq: Every day | ORAL | 1 refills | Status: DC

## 2020-02-15 MED ORDER — FERROUS SULFATE 325 (65 FE) MG PO TABS: 325.0000 mg | ORAL_TABLET | Freq: Every day | ORAL | 0 refills | Status: DC

## 2020-02-15 MED ORDER — LATANOPROST 0.005 % OP SOLN: 1.0000 [drp] | Freq: Every day | OPHTHALMIC | 1 refills | Status: DC

## 2020-02-16 ENCOUNTER — Other Ambulatory Visit: Payer: Self-pay

## 2020-02-16 ENCOUNTER — Ambulatory Visit: Payer: Medicare HMO | Attending: Internal Medicine

## 2020-02-16 DIAGNOSIS — IMO0002 Reserved for concepts with insufficient information to code with codable children: Secondary | ICD-10-CM

## 2020-02-16 DIAGNOSIS — E1169 Type 2 diabetes mellitus with other specified complication: Secondary | ICD-10-CM

## 2020-02-16 DIAGNOSIS — Z23 Encounter for immunization: Secondary | ICD-10-CM

## 2020-02-16 DIAGNOSIS — E785 Hyperlipidemia, unspecified: Secondary | ICD-10-CM

## 2020-02-16 DIAGNOSIS — E1165 Type 2 diabetes mellitus with hyperglycemia: Secondary | ICD-10-CM

## 2020-02-16 NOTE — Progress Notes (Signed)
   Covid-19 Vaccination Clinic  Name:  Angie Cain    MRN: [id number] DOB: [birth year]  02/16/2020  [name] was observed post Covid-19 immunization for 15 minutes without incident. She was provided with Vaccine Information Sheet and instruction to access the V-Safe system.   [name] was instructed to call 911 with any severe reactions post vaccine: Marland Kitchen Difficulty breathing  . Swelling of face and throat  . A fast heartbeat  . A bad rash all over body  . Dizziness and weakness   Immunizations Administered    Name Date Dose VIS Date Route   Pfizer COVID-19 Vaccine 02/16/2020 11:25 AM 0.3 mL 11/24/2019 Intramuscular   Manufacturer: [place]   Lot: [id number]   [place]: [id number]

## 2020-02-19 MED ORDER — JANUMET 50-1000 MG PO TABS: ORAL_TABLET | ORAL | 0 refills | Status: DC

## 2020-02-19 MED ORDER — ATORVASTATIN CALCIUM 40 MG PO TABS: ORAL_TABLET | ORAL | 0 refills | Status: DC

## 2020-02-22 ENCOUNTER — Other Ambulatory Visit: Payer: Self-pay | Admitting: *Deleted

## 2020-02-22 DIAGNOSIS — D509 Iron deficiency anemia, unspecified: Secondary | ICD-10-CM

## 2020-02-22 DIAGNOSIS — I10 Essential (primary) hypertension: Secondary | ICD-10-CM

## 2020-02-22 DIAGNOSIS — E785 Hyperlipidemia, unspecified: Secondary | ICD-10-CM

## 2020-02-22 DIAGNOSIS — E1169 Type 2 diabetes mellitus with other specified complication: Secondary | ICD-10-CM

## 2020-02-22 DIAGNOSIS — IMO0002 Reserved for concepts with insufficient information to code with codable children: Secondary | ICD-10-CM

## 2020-02-22 DIAGNOSIS — E1165 Type 2 diabetes mellitus with hyperglycemia: Secondary | ICD-10-CM

## 2020-02-26 MED ORDER — ACCU-CHEK FASTCLIX LANCETS MISC: 1 refills | Status: DC

## 2020-02-26 MED ORDER — ATORVASTATIN CALCIUM 40 MG PO TABS: ORAL_TABLET | ORAL | 1 refills | Status: DC

## 2020-02-26 MED ORDER — BD SWAB SINGLE USE REGULAR PADS: 1.0000 | MEDICATED_PAD | Freq: Every day | 1 refills | Status: DC

## 2020-02-26 MED ORDER — JANUMET 50-1000 MG PO TABS: ORAL_TABLET | ORAL | 1 refills | Status: DC

## 2020-02-26 MED ORDER — FERROUS SULFATE 325 (65 FE) MG PO TABS: 325.0000 mg | ORAL_TABLET | Freq: Every day | ORAL | 1 refills | Status: DC

## 2020-02-26 MED ORDER — ACCU-CHEK AVIVA PLUS W/DEVICE KIT: PACK | 1 refills | Status: DC

## 2020-02-26 MED ORDER — LANTUS SOLOSTAR 100 UNIT/ML ~~LOC~~ SOPN: 32.0000 [IU] | PEN_INJECTOR | Freq: Every day | SUBCUTANEOUS | 1 refills | Status: DC

## 2020-02-26 MED ORDER — ACCU-CHEK GUIDE VI STRP: ORAL_STRIP | 1 refills | Status: DC

## 2020-02-26 MED ORDER — LOSARTAN POTASSIUM 50 MG PO TABS: 50.0000 mg | ORAL_TABLET | Freq: Every day | ORAL | 1 refills | Status: DC

## 2020-02-26 MED ORDER — ACCU-CHEK AVIVA VI SOLN: 0 refills | Status: DC

## 2020-05-22 IMAGING — CT CT ANGIO CHEST-ABD-PELV FOR DISSECTION W/ AND WO/W CM
3 of 7 series · 14 of 46 positions shown, 16 images · IV contrast (iopamidol)
Comparison: CT scan of February 10, 2018 and June 13, 2011.

CLINICAL DATA: Back and lower abdominal pain.

EXAM:
CT ANGIOGRAPHY CHEST, ABDOMEN AND PELVIS
TECHNIQUE: Multidetector CT imaging through the chest, abdomen and pelvis was
performed using the standard protocol during bolus administration of
intravenous contrast. Multiplanar reconstructed images and MIPs were
obtained and reviewed to evaluate the vascular anatomy.
CONTRAST:  100mL 2NA4S6-AMY IOPAMIDOL (2NA4S6-AMY) INJECTION 76%

[Series 2: axial pre · axial · non-contrast · 0.90mm/px · z∈[+1538,+1632]mm · 2 of 59 slices shown]
[im 20/59  soft-tissue]
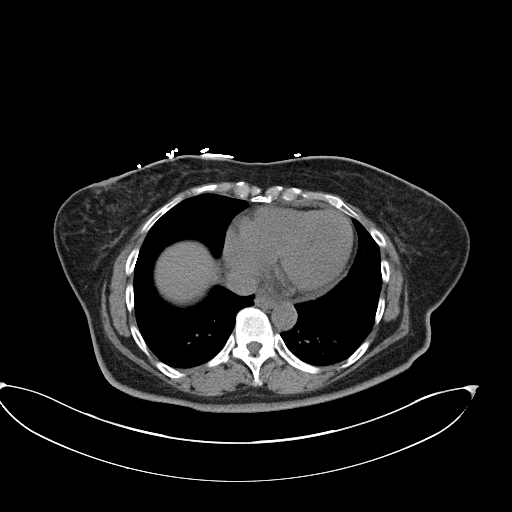
[im 39/59  soft-tissue]
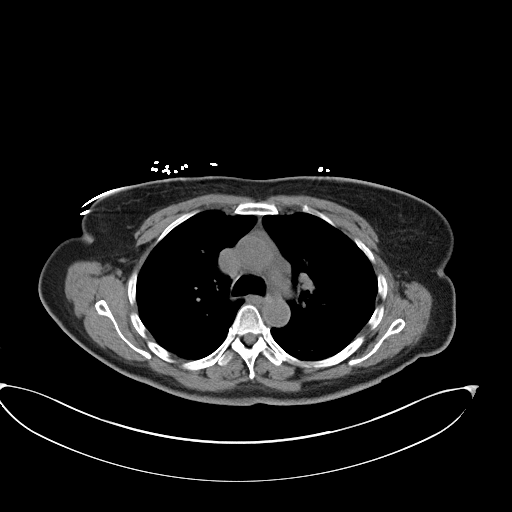

[Series 5: axial arterial · axial · arterial · 0.98mm/px · z∈[+1172,+1670]mm · 9 of 208 slices shown, 11 images]
[im 21/208  soft-tissue]
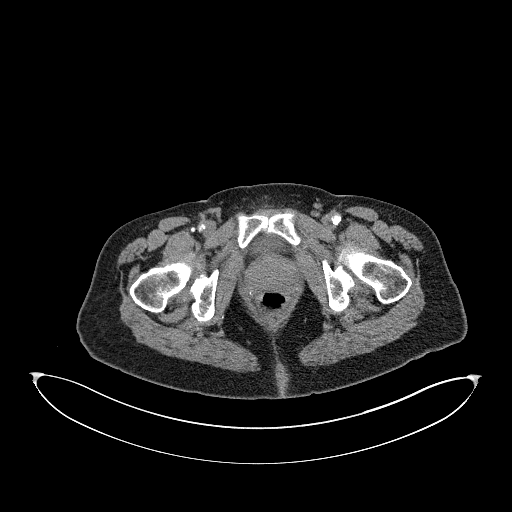
[im 21/208  bone]
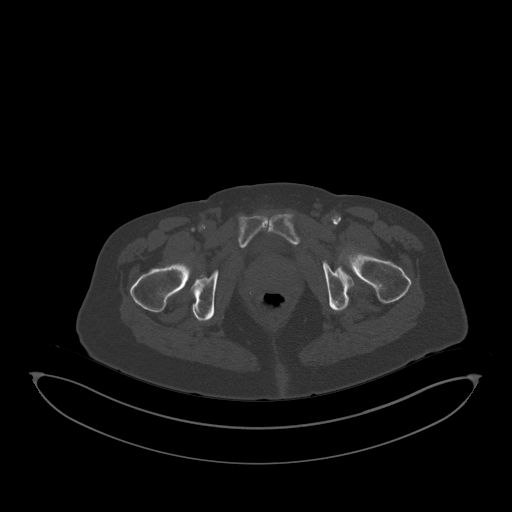
[im 42/208  soft-tissue]
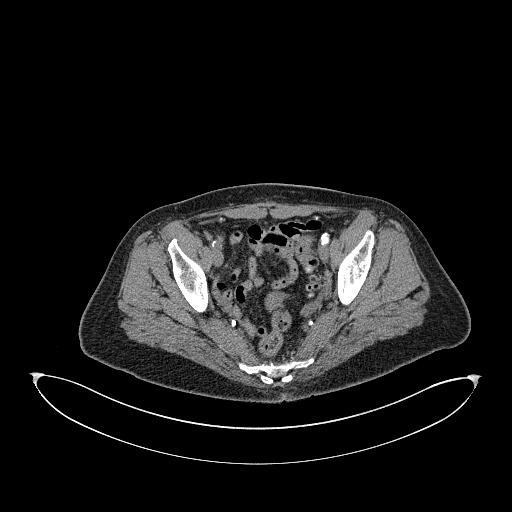
[im 63/208  soft-tissue]
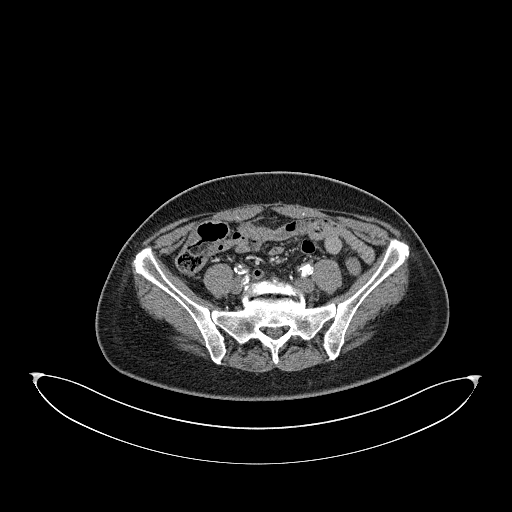
[im 83/208  soft-tissue]
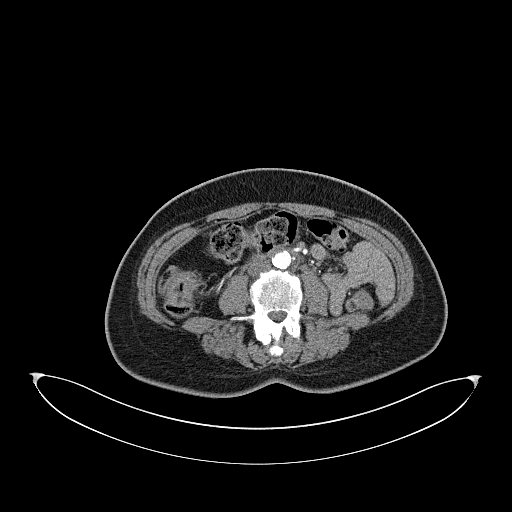
[im 104/208  soft-tissue]
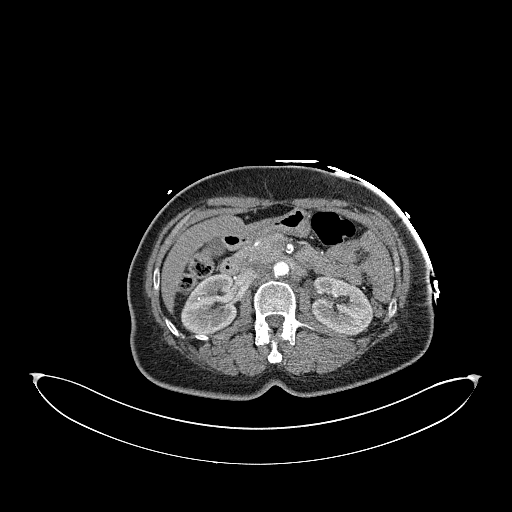
[im 125/208  soft-tissue]
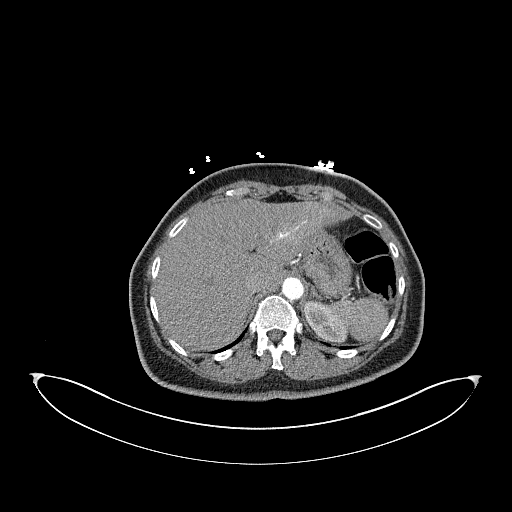
[im 145/208  soft-tissue]
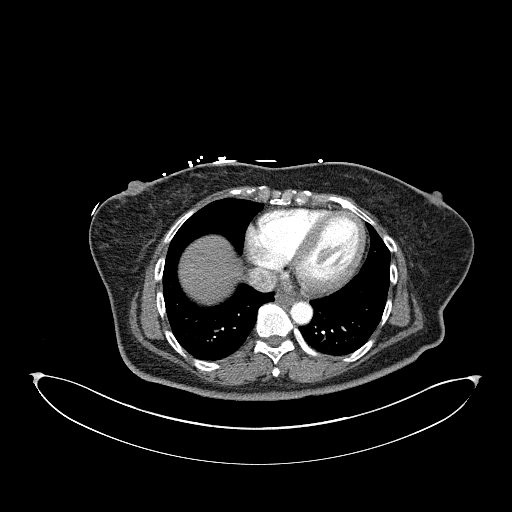
[im 166/208  soft-tissue]
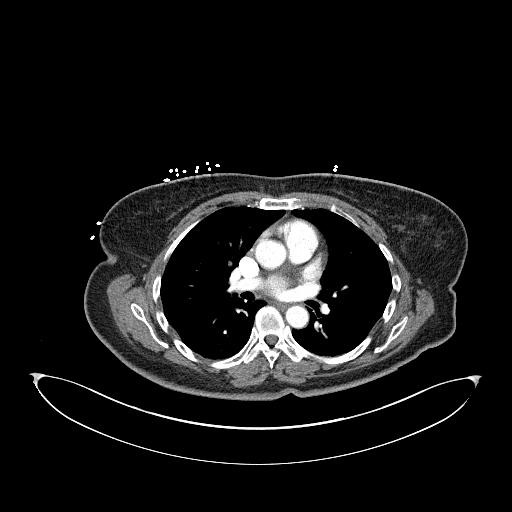
[im 187/208  soft-tissue]
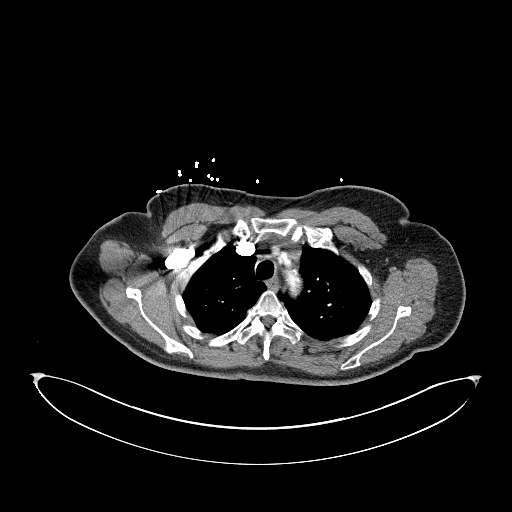
[im 187/208  bone]
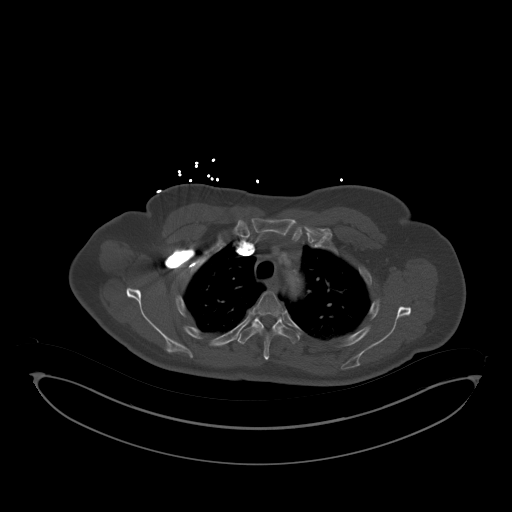

[Series 7: coronals · coronal · 0.86mm/px · 3 of 150 slices shown]
[im 38/150  soft-tissue]
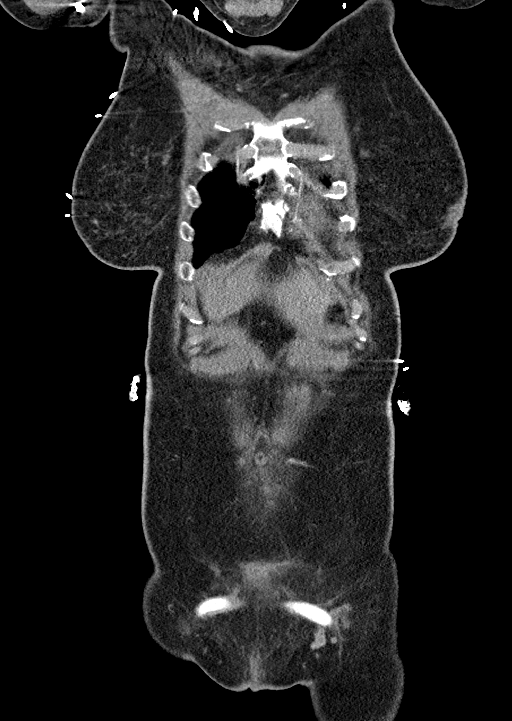
[im 75/150  soft-tissue]
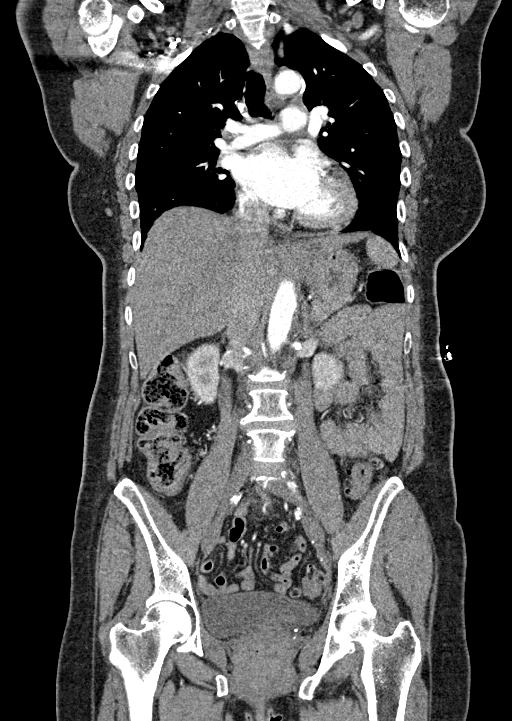
[im 112/150  soft-tissue]
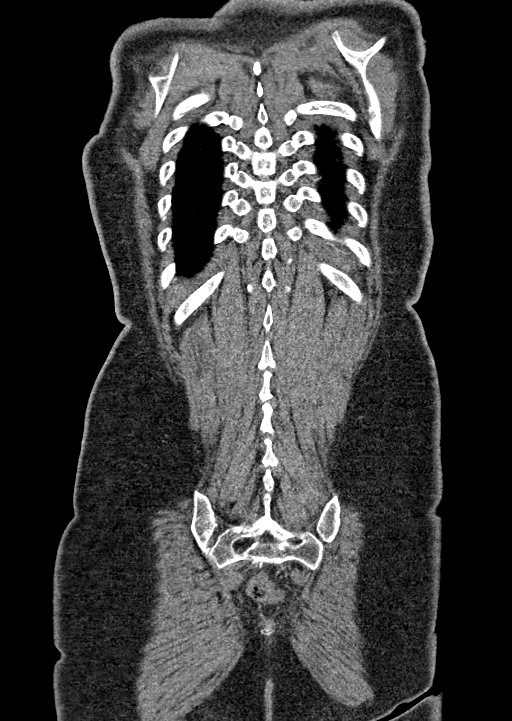

[14 of 46 positions shown; findings below may reference images not displayed]

FINDINGS: CTA CHEST FINDINGS

Cardiovascular: Atherosclerosis of thoracic aorta is noted without
aneurysm or dissection. Normal cardiac size. No pericardial effusion
is noted.

Mediastinum/Nodes: No enlarged mediastinal, hilar, or axillary lymph
nodes. Thyroid gland, trachea, and esophagus demonstrate no
significant findings.

Lungs/Pleura: Lungs are clear. No pleural effusion or pneumothorax.

Musculoskeletal: No chest wall abnormality. No acute or significant
osseous findings.

Review of the MIP images confirms the above findings.

CTA ABDOMEN AND PELVIS FINDINGS

VASCULAR

Aorta: Atherosclerosis of abdominal aorta is noted without aneurysm
or dissection.

Celiac: Mild stenosis is noted at the origin of the celiac artery
with mild poststenotic dilatation.

SMA: Proximal portion of superior mesenteric artery is patent,
although significant atheromatous disease is noted in its middle and
distal portions resulting in significant stenosis.

Renals: Severe stenosis is noted in the proximal portion of the left
renal artery secondary to calcified plaque. Moderate stenosis is
noted at the origin of the right renal artery secondary to eccentric
calcified plaque.

IMA: Patent without evidence of aneurysm, dissection, vasculitis or
significant stenosis.

Inflow: Severe atheromatous disease is noted in the right common
iliac artery with complete occlusion of the right external iliac
artery. Atheromatous disease is seen throughout the left common and
external iliac arteries resulting in multifocal mild stenoses.
Patent left to right fem-fem bypass graft is noted.

Veins: The visualized portions of the proximal superficial femoral
arteries bilaterally are patent.

Review of the MIP images confirms the above findings.

NON-VASCULAR

Hepatobiliary: No gallstones are noted. No biliary dilatation is
noted. Stable ill-defined enhancing abnormality is noted in left
hepatic lobe compared to prior exam most consistent with benign
vascular malformation or hemangioma given its lack of change since
9099.

Pancreas: Unremarkable. No pancreatic ductal dilatation or
surrounding inflammatory changes.

Spleen: Normal in size without focal abnormality.

Adrenals/Urinary Tract: Adrenal glands are unremarkable. Kidneys are
normal, without renal calculi, focal lesion, or hydronephrosis.
Bladder is unremarkable.

Stomach/Bowel: Stomach is within normal limits. Appendix appears
normal. No evidence of bowel wall thickening, distention, or
inflammatory changes.

Lymphatic: No significant adenopathy is noted.

Reproductive: Status post hysterectomy. No adnexal masses.

Other: No abdominal wall hernia or abnormality. No abdominopelvic
ascites.

Musculoskeletal: No acute or significant osseous findings.

Review of the MIP images confirms the above findings.
IMPRESSION: No evidence of thoracic or abdominal aortic aneurysm or dissection.

Mild stenosis is noted at origin of celiac artery with poststenotic
dilatation.

Severe atheromatous disease is noted in the middle and distal
portions of superior mesenteric artery resulting in multifocal
moderate stenosis.

Severe stenosis is noted at origin of left renal artery, with
moderate stenosis noted at origin of right renal artery.

Complete occlusion of right external iliac artery is noted. Mild
atheromatous disease is seen throughout the left common and external
iliac arteries, with patent left to right fem-fem bypass graft.

Stable enhancing abnormality seen in left hepatic lobe since 9099
most consistent with benign etiology.

Aortic Atherosclerosis ([id number].X).

## 2020-05-31 ENCOUNTER — Encounter: Payer: Self-pay | Admitting: *Deleted

## 2020-06-10 ENCOUNTER — Encounter: Payer: Self-pay | Admitting: *Deleted

## 2020-06-18 ENCOUNTER — Encounter: Payer: Self-pay | Admitting: *Deleted

## 2020-06-18 DIAGNOSIS — H04123 Dry eye syndrome of bilateral lacrimal glands: Secondary | ICD-10-CM | POA: Diagnosis not present

## 2020-06-18 DIAGNOSIS — H401131 Primary open-angle glaucoma, bilateral, mild stage: Secondary | ICD-10-CM | POA: Diagnosis not present

## 2020-06-18 DIAGNOSIS — H2513 Age-related nuclear cataract, bilateral: Secondary | ICD-10-CM | POA: Diagnosis not present

## 2020-06-18 DIAGNOSIS — H40013 Open angle with borderline findings, low risk, bilateral: Secondary | ICD-10-CM | POA: Diagnosis not present

## 2020-06-18 DIAGNOSIS — E119 Type 2 diabetes mellitus without complications: Secondary | ICD-10-CM | POA: Diagnosis not present

## 2020-06-18 LAB — HM DIABETES EYE EXAM

## 2020-08-28 ENCOUNTER — Other Ambulatory Visit: Payer: Self-pay | Admitting: *Deleted

## 2020-08-28 MED ORDER — LATANOPROST 0.005 % OP SOLN: 1.0000 [drp] | Freq: Every day | OPHTHALMIC | 1 refills | Status: DC

## 2020-08-28 NOTE — Telephone Encounter (Signed)
Next appt scheduled 10/5 with PCP.

## 2020-09-17 ENCOUNTER — Ambulatory Visit (INDEPENDENT_AMBULATORY_CARE_PROVIDER_SITE_OTHER): Payer: Medicare HMO | Admitting: Student

## 2020-09-17 ENCOUNTER — Encounter: Payer: Self-pay | Admitting: *Deleted

## 2020-09-17 ENCOUNTER — Ambulatory Visit: Payer: Medicare HMO

## 2020-09-17 ENCOUNTER — Encounter: Payer: Self-pay | Admitting: Student

## 2020-09-17 ENCOUNTER — Other Ambulatory Visit: Payer: Self-pay

## 2020-09-17 VITALS — BP 136/78 | HR 74 | Temp 98.0°F | Wt 149.3 lb

## 2020-09-17 DIAGNOSIS — E1165 Type 2 diabetes mellitus with hyperglycemia: Secondary | ICD-10-CM

## 2020-09-17 DIAGNOSIS — F332 Major depressive disorder, recurrent severe without psychotic features: Secondary | ICD-10-CM

## 2020-09-17 DIAGNOSIS — Z23 Encounter for immunization: Secondary | ICD-10-CM | POA: Diagnosis not present

## 2020-09-17 DIAGNOSIS — I10 Essential (primary) hypertension: Secondary | ICD-10-CM | POA: Diagnosis not present

## 2020-09-17 DIAGNOSIS — Z794 Long term (current) use of insulin: Secondary | ICD-10-CM | POA: Diagnosis not present

## 2020-09-17 DIAGNOSIS — M1611 Unilateral primary osteoarthritis, right hip: Secondary | ICD-10-CM | POA: Diagnosis not present

## 2020-09-17 DIAGNOSIS — IMO0002 Reserved for concepts with insufficient information to code with codable children: Secondary | ICD-10-CM

## 2020-09-17 LAB — POCT GLYCOSYLATED HEMOGLOBIN (HGB A1C): Hemoglobin A1C: 9.4 % — AB (ref 4.0–5.6)

## 2020-09-17 LAB — GLUCOSE, CAPILLARY: Glucose-Capillary: 198 mg/dL — ABNORMAL HIGH (ref 70–99)

## 2020-09-17 NOTE — Assessment & Plan Note (Signed)
Patent had a BP of 136/78 in clinic today. Well-controlled on current BP regimen. Will continue to monitor.

## 2020-09-17 NOTE — Patient Instructions (Addendum)
Thank you, [name] for allowing [place] to provide your care today. Today we discussed your depression and diabetes. We have referred you to a behavioral health for counseling. Continue taking your medications as prescribed. .    I have ordered the following labs for you:   Lab Orders     Glucose, capillary     POC Hbg A1C   I will call if any are abnormal. All of your labs can be accessed through "My Chart".  I have place a referrals to Behavioral health.    Please follow-up as needed  Should you have any questions or concerns please call the internal medicine clinic at [phone].    Linwood Dibbles, MD, MPH [facility]   My Chart Access: [email or web address]   If you have not already done so, please get your COVID 19 vaccine  To schedule an appointment for a COVID vaccine choice any of the following: Go to [email or web address]   Go to [email or web address]                  Call [phone]                                     Call [phone] and select Option 2

## 2020-09-17 NOTE — Progress Notes (Signed)
94

## 2020-09-17 NOTE — Assessment & Plan Note (Addendum)
Patient presented with worsening depression with a PHQ9 score of 17 today compared to a score of 9 one year ago. Patient states since May of this year, she has had a significant amount of stressors that include a brother that died from a drug overdose, 2 cousins who died just days apart from cancer and diabetic complications and a friend who also passed away. Her husband and son have not been as supportive as she would like at home. She has loss of appetite, feels depress and hopeless, have difficulty sleeping and feels fatigue most days. She also has difficulty concentrating or doing the things she enjoyed doing before. Patient reports she was prescribed Zoloft and bupropion by a psychiatrist more than 5 years ago for depression but she stopped taking the medications due to side effects. She would like mental health care for her depression but would prefer to speak with a behavioral health counselor first before jumping to medications. Patient states she does not want bereavement support at this time as she will grief at her own time. She just wants to feel better so she can work on taking care of herself.   Plan: --Referral to psychiatry/behavioral health --CBT plus an SSRI will benefit patient --F/u on symptoms at next office visit and start an SSRI if no improvement.

## 2020-09-17 NOTE — Progress Notes (Signed)
   CC: Depression  HPI:  Angie Cain is a 64 y.o.  Female w/ PMH of IDDM, HLP, GERD, PVD, arthritis and IDA who presents for depressed mood.   Please see problem based charting for evaluation, assessment and plan.  Past Medical History:  Diagnosis Date  . Allergy   . Anxiety    occasional  . Arthritis   . Atherosclerotic peripheral vascular disease with intermittent claudication ([place])    s/p fem-fem bypass 2011. ABI 07/2012 0.5 R 0.65 L  . Diabetes mellitus without complication ([place])   . GERD (gastroesophageal reflux disease)   . Hyperlipidemia   . Hypertension   . IDDM (insulin dependent diabetes mellitus) 07/21/2007   July 2011: Femoral-femoral bypass  ABI 08/11/2012 notable for 0.4-0.59 on right ankle suggestive of severe arterial occlusive disease at rest and 0.60-0.7 on left ankle suggestive of moderate arterial occlusive disease at rest.   . Personal history of colonic polyps - adenomas 03/07/2014  . Tobacco abuse    Review of Systems:  All ROS negative otherwise as stated in the HPI  Physical Exam:  General: Pleasant, well developed and nourished elderly woman. No acute distress. Head: Normocephalic, atraumatic w/o tenderness Cardiac: RRR. No murmurs, rubs or gallops. S1, S2. No lower extremities edema Respiratory: Lungs CTAB. No wheezing or crackles. No increased WOB Abdominal: Soft, symmetric and non tender. No organomegaly. Normal bowel sounds Skin: Warm, dry and intact without rashes or lesions Extremities: Atraumatic. Full ROM. Pulse palpable. Neuro: A&O x 3. Moves all extremities. Normal sensation. Psych: Depressed mood. Normal judgement and insight. No hallucinations.  Vitals:   09/17/20 0949  BP: 136/78  Pulse: 74  Temp: 98 F (36.7 C)  TempSrc: Oral  SpO2: 100%  Weight: 149 lb 4.8 oz (67.7 kg)    Assessment & Plan:   See Encounters Tab for problem based charting.  Patient seen with Dr. Louann Liv, MD, MPH

## 2020-09-17 NOTE — Assessment & Plan Note (Signed)
Patient's A1C was 8.7% at last office visit. A1C is now 9.4%. Patient states she has felt depressed the last few months due to multiple deaths among her family and friends. This has made her less adherence to her diabetic regimen. Sometimes she does not take her insulin if she doesn't feel well. Other times, she does not get all the medication into her body on one injection because of pain in her right arm and hands. States she usually eats a healthy diet that includes baked fish, chicken and veggies but has to cook unhealthy foods for her husband and son so she eats some of those meals sometimes. She endorse some polydipsia and polyuria but denies any headaches, dizziness, or abd pain. Patient also reports having shooting pains in her right leg that is being treated by ortho. Patient's glucose meter ranged from a low of 55 to a high of 320.   Plan: --Continue Lantus 23 units --Continue Janumet 50-1000 mg --Consider modifying diabetic regimen if blood sugars/A1C are still elevated after patient seeks mental health care and is more consistent with taking her insulin.

## 2020-09-17 NOTE — Assessment & Plan Note (Signed)
Patient continues to have shooting pains down her right leg. This has been limiting her functional status. She follows ortho for this and they have been managing it with injections. Plans to set up an appointment with them later this month.  Plan: --Schedule f/u with ortho.  --Home hip and stretching exercise.

## 2020-09-19 NOTE — Progress Notes (Signed)
DOS 09/17/20:  Internal Medicine Clinic Attending  I saw and evaluated the patient.  I personally confirmed the key portions of the history and exam documented by Dr. Coy Saunas and I reviewed pertinent patient test results.  The assessment, diagnosis, and plan were formulated together and I agree with the documentation in the resident's note.

## 2020-09-24 ENCOUNTER — Ambulatory Visit: Payer: Medicare HMO | Attending: Internal Medicine

## 2020-09-24 DIAGNOSIS — Z23 Encounter for immunization: Secondary | ICD-10-CM

## 2020-09-24 NOTE — Progress Notes (Signed)
   Covid-19 Vaccination Clinic  Name:  Adream Parzych    MRN: [id number] DOB: November 25, 1956  09/24/2020  Ms. Desilva was observed post Covid-19 immunization for 15 minutes without incident. She was provided with Vaccine Information Sheet and instruction to access the V-Safe system.   Ms. Andreasen was instructed to call 911 with any severe reactions post vaccine: Marland Kitchen Difficulty breathing  . Swelling of face and throat  . A fast heartbeat  . A bad rash all over body  . Dizziness and weakness

## 2020-09-25 ENCOUNTER — Encounter: Payer: Self-pay | Admitting: *Deleted

## 2020-09-25 NOTE — Progress Notes (Deleted)
Things That May Be Affecting Your Health:  Alcohol  Hearing loss  Pain    Depression  Home Safety  Sexual Health   Diabetes  Lack of physical activity  Stress   Difficulty with daily activities  Loneliness  Tiredness   Drug use  Medicines  Tobacco use   Falls  Motor Vehicle Safety  Weight   Food choices  Oral Health  Other    YOUR PERSONALIZED HEALTH PLAN : 1. Schedule your next subsequent Medicare Wellness visit in one year 2. Attend all of your regular appointments to address your medical issues 3. Complete the preventative screenings and services   Annual Wellness Visit   Medicare Covered Preventative Screenings and Services  Services & Screenings Men and Women Who How Often Need? Date of Last Service Action  Abdominal Aortic Aneurysm Adults with AAA risk factors Once     Alcohol Misuse and Counseling All Adults Screening once a year if no alcohol misuse. Counseling up to 4 face to face sessions.     Bone Density Measurement  Adults at risk for osteoporosis Once every 2 yrs     Lipid Panel Z13.6 All adults without CV disease Once every 5 yrs     Colorectal Cancer   Stool sample or  Colonoscopy All adults 50 and older   Once every year  Every 10 years     Depression All Adults Once a year  Today   Diabetes Screening Blood glucose, post glucose load, or GTT Z13.1  All adults at risk  Pre-diabetics  Once per year  Twice per year     Diabetes  Self-Management Training All adults Diabetics 10 hrs first year; 2 hours subsequent years. Requires Copay     Glaucoma  Diabetics  Family history of glaucoma  African Americans 50 yrs +  Hispanic Americans 65 yrs + Annually - requires coppay     Hepatitis C Z72.89 or F19.20  High Risk for HCV  Born between 1945 and 1965  Annually  Once     HIV Z11.4 All adults based on risk  Annually btw ages 15 & 65 regardless of risk  Annually > 65 yrs if at increased risk     Lung Cancer Screening Asymptomatic adults aged  55-77 with 30 pack yr history and current smoker OR quit within the last 15 yrs Annually Must have counseling and shared decision making documentation before first screen     Medical Nutrition Therapy Adults with   Diabetes  Renal disease  Kidney transplant within past 3 yrs 3 hours first year; 2 hours subsequent years     Obesity and Counseling All adults Screening once a year Counseling if BMI 30 or higher  Today   Tobacco Use Counseling Adults who use tobacco  Up to 8 visits in one year     Vaccines Z23  Hepatitis B  Influenza   Pneumonia  Adults   Once  Once every flu season  Two different vaccines separated by one year     Next Annual Wellness Visit People with Medicare Every year  Today     Services & Screenings Women Who How Often Need  Date of Last Service Action  Mammogram  Z12.31 Women over 40 One baseline ages 35-39. Annually ager 40 yrs+     Pap tests All women Annually if high risk. Every 2 yrs for normal risk women     Screening for cervical cancer with   Pap (Z01.419 nl or Z01.411abnl) &    HPV Z11.51 Women aged 51 to 43 Once every 5 yrs     Screening pelvic and breast exams All women Annually if high risk. Every 2 yrs for normal risk women     Sexually Transmitted Diseases  Chlamydia  Gonorrhea  Syphilis All at risk adults Annually for non pregnant females at increased risk         [place] Men Who How Ofter Need  Date of Last Service Action  Prostate Cancer - DRE & PSA Men over 50 Annually.  DRE might require a copay.     Sexually Transmitted Diseases  Syphilis All at risk adults Annually for men at increased risk

## 2020-09-25 NOTE — Progress Notes (Unsigned)
Things That May Be Affecting Your Health:  Alcohol  Hearing loss  Pain   X Depression  Home Safety  Sexual Health  X Diabetes  Lack of physical activity X Stress   Difficulty with daily activities  Loneliness X Tiredness   Drug use  Medicines  Tobacco use   Falls  Motor Vehicle Safety  Weight   Food choices  Oral Health  Other    YOUR PERSONALIZED HEALTH PLAN : 1. Schedule your next subsequent Medicare Wellness visit in one year 2. Attend all of your regular appointments to address your medical issues 3. Complete the preventative screenings and services   Annual Wellness Visit   Medicare Covered Preventative Screenings and [place] Men and Women Who How Often Need? Date of Last Service Action  Abdominal Aortic Aneurysm Adults with AAA risk factors Once No    Alcohol Misuse and Counseling All Adults Screening once a year if no alcohol misuse. Counseling up to 4 face to face sessions.     Bone Density Measurement  Adults at risk for osteoporosis Once every 2 yrs Yes 05/2016 Last Dexa scan showed osteopenia. Due for one now  Lipid Panel Z13.6 All adults without CV disease Once every 5 yrs Yes 05/2014 Last lipid panel was normal. Due for another.   Colorectal Cancer   Stool sample or  Colonoscopy All adults 50 and older   Once every year  Every 10 years No 06/2017 They found polyps that were removed. Due for next one in 2023.   Depression All Adults Once a year  Today Very depressed, significant stressors and some grief. Referred to behavioral health. Follow up with referral.   Diabetes Screening Blood glucose, post glucose load, or GTT Z13.1  All adults at risk  Pre-diabetics  Once per year  Twice per year No 09/2020 On a diabetic regimen. A1C not at goal.   Diabetes  Self-Management Training All adults Diabetics 10 hrs first year; 2 hours subsequent years. Requires Copay Yes    Glaucoma  Diabetics  Family history of glaucoma  African Americans 50  yrs +  Hispanic Americans 65 yrs + Annually - requires coppay     Hepatitis C Z72.89 or F19.20  High Risk for HCV  Born between [place]  Annually  Once No 09/2016 Neg for Hep C virus ab.   HIV Z11.4 All adults based on risk  Annually btw ages 69 & 28 regardless of risk  Annually > 65 yrs if at increased risk No 09/2016 Last HIV test was non reactive in 2017. Not at increased risk  Lung Cancer Screening Asymptomatic adults aged 34-77 with 30 pack yr history and current smoker OR quit within the last 15 yrs Annually Must have counseling and shared decision making documentation before first screen     Medical Nutrition Therapy Adults with   Diabetes  Renal disease  Kidney transplant within past 3 yrs 3 hours first year; 2 hours subsequent years     Obesity and Counseling All adults Screening once a year Counseling if BMI 30 or higher  Today   Tobacco Use Counseling Adults who use tobacco  Up to 8 visits in one year     Vaccines Z23  Hepatitis B  Influenza   Pneumonia  Adults   Once  Once every flu season  Two different vaccines separated by one year     Next Annual Wellness Visit People with Medicare Every year  Today  Services & Screenings Women Who How Often Need  Date of Last Service Action  Mammogram  Z12.31 Women over 8 One baseline ages 15-39. Annually ager 40 yrs+     Pap tests All women Annually if high risk. Every 2 yrs for normal risk women     Screening for cervical cancer with   Pap (Z01.419 nl or Z01.411abnl) &  HPV Z11.51 Women aged 23 to 61 Once every 5 yrs     Screening pelvic and breast exams All women Annually if high risk. Every 2 yrs for normal risk women     Sexually Transmitted Diseases  Chlamydia  Gonorrhea  Syphilis All at risk adults Annually for non pregnant females at increased risk         [place] Men Who How Ofter Need  Date of Last Service Action  Prostate Cancer - DRE & PSA Men over 50 Annually.   DRE might require a copay.     Sexually Transmitted Diseases  Syphilis All at risk adults Annually for men at increased risk

## 2020-09-25 NOTE — Progress Notes (Unsigned)
New Patient Office Visit  Subjective:  Patient ID: Angie Cain, female    DOB: 1956-11-03  Age: 64 y.o. MRN: [id number]  CC: No chief complaint on file.   HPI Angie Cain presents for ***  Past Medical History:  Diagnosis Date  . Allergy   . Anxiety    occasional  . Arthritis   . Atherosclerotic peripheral vascular disease with intermittent claudication ([place])    s/p fem-fem bypass 2011. ABI 07/2012 0.5 R 0.65 L  . Diabetes mellitus without complication ([place])   . GERD (gastroesophageal reflux disease)   . Hyperlipidemia   . Hypertension   . IDDM (insulin dependent diabetes mellitus) 07/21/2007   July 2011: Femoral-femoral bypass  ABI 08/11/2012 notable for 0.4-0.59 on right ankle suggestive of severe arterial occlusive disease at rest and 0.60-0.7 on left ankle suggestive of moderate arterial occlusive disease at rest.   . Personal history of colonic polyps - adenomas 03/07/2014  . Tobacco abuse     Past Surgical History:  Procedure Laterality Date  . VAGINAL HYSTERECTOMY    . 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 Diabetes Mother   . Diabetes Father   . Diabetes Sister   . Hypertension Sister   . Diabetes Brother   . Hypertension Brother   . Colon cancer Neg Hx   . Esophageal cancer Neg Hx   . Stomach cancer Neg Hx   . Rectal cancer Neg Hx   . Breast cancer Neg Hx     Social History   Socioeconomic History  . Marital status: Married    Spouse name: Not on file  . Number of children: Not on file  . Years of education: Not on file  . Highest education level: Not on file  Occupational History  . Not on file  Tobacco Use  . Smoking status: Current Every Day Smoker    Packs/day: 0.10    Types: Cigarettes  . Smokeless tobacco: Never Used  . Tobacco comment: Smokes 2-3 cigs/day.  Vaping Use  . Vaping Use: Never used  Substance and Sexual Activity  . Alcohol use: Yes     Alcohol/week: 1.0 - 2.0 standard drink    Types: 1 - 2 Glasses of wine per week    Comment: Occasionally  . Drug use: No  . Sexual activity: Yes    Partners: Male  Other Topics Concern  . Not on file  Social History Narrative  . Not on file   Social Determinants of Health   Financial Resource Strain:   . Difficulty of Paying Living Expenses: Not on file  Food Insecurity:   . Worried About Charity fundraiser in the Last Year: Not on file  . Ran Out of Food in the Last Year: Not on file  Transportation Needs:   . Lack of Transportation (Medical): Not on file  . Lack of Transportation (Non-Medical): Not on file  Physical Activity:   . Days of Exercise per Week: Not on file  . Minutes of Exercise per Session: Not on file  Stress:   . Feeling of Stress : Not on file  Social Connections:   . Frequency of Communication with Friends and Family: Not on file  . Frequency of Social Gatherings with Friends and Family: Not on file  . Attends Religious Services: Not on file  . Active Member of Clubs or Organizations: Not on  file  . Attends Archivist Meetings: Not on file  . Marital Status: Not on file  Intimate Partner Violence:   . Fear of Current or Ex-Partner: Not on file  . Emotionally Abused: Not on file  . Physically Abused: Not on file  . Sexually Abused: Not on file    ROS Review of Systems  Objective:   Today's Vitals: There were no vitals taken for this visit.  Physical Exam  Assessment & Plan:   Problem List Items Addressed This Visit    None      Outpatient Encounter Medications as of 09/25/2020  Medication Sig  . Accu-Chek FastClix Lancets MISC Check blood sugar 3 times a day  . Alcohol Swabs (B-D SINGLE USE SWABS REGULAR) PADS 1 each by Does not apply route 6 (six) times daily.  Marland Kitchen amLODipine (NORVASC) 5 MG tablet Take 1 tablet (5 mg total) by mouth daily.  Marland Kitchen aspirin EC 81 MG tablet Take 81 mg by mouth daily.  Marland Kitchen atorvastatin (LIPITOR) 40 MG  tablet TAKE ONE (1) TABLET BY MOUTH EVERY DAY  . Blood Glucose Calibration (ACCU-CHEK AVIVA) SOLN Use to check controls on diabetic glucose meter monthly  . Blood Glucose Monitoring Suppl (ACCU-CHEK AVIVA PLUS) w/Device KIT Check blood sugar up to 3 times a day  . calcium-vitamin D (OSCAL WITH D) 500-200 MG-UNIT tablet Take 1 tablet by mouth 2 (two) times daily.  . diclofenac sodium (VOLTAREN) 1 % GEL Apply 2 g topically 4 (four) times daily.  . ferrous sulfate 325 (65 FE) MG tablet Take 1 tablet (325 mg total) by mouth daily.  Marland Kitchen glucose blood (ACCU-CHEK GUIDE) test strip Check blood sugar 3 times per day  . insulin glargine (LANTUS SOLOSTAR) 100 UNIT/ML Solostar Pen Inject 32 Units into the skin at bedtime.  . Insulin Pen Needle (UNIFINE PENTIPS) 31G X 5 MM MISC Use to inject insulin into the skin 1 time daily at bedtime  . Insulin Syringe-Needle U-100 31G X 15/64" 0.3 ML MISC Use to inject insulin one time daily  . latanoprost (XALATAN) 0.005 % ophthalmic solution Place 1 drop into both eyes at bedtime.  Marland Kitchen losartan (COZAAR) 50 MG tablet Take 1 tablet (50 mg total) by mouth daily.  Marland Kitchen omeprazole (PRILOSEC) 40 MG capsule TAKE ONE CAPSULE BY MOUTH ONCE DAILY AS NEEDED (Patient taking differently: Take 40 mg by mouth 2 (two) times daily as needed (acid reflux). )  . sitaGLIPtin-metformin (JANUMET) 50-1000 MG tablet TAKE 1 TABLET BY MOUTH TWO TIMES DAILY WITH A MEAL  . [DISCONTINUED] amLODipine (NORVASC) 5 MG tablet TAKE 1 TABLET (5 MG TOTAL) BY MOUTH DAILY.   No facility-administered encounter medications on file as of 09/25/2020.    Follow-up: No follow-ups on file.   Avie Echevaria

## 2020-09-25 NOTE — Progress Notes (Unsigned)

## 2020-09-26 NOTE — Progress Notes (Signed)
Okay, working on it.

## 2020-10-02 ENCOUNTER — Other Ambulatory Visit: Payer: Self-pay | Admitting: *Deleted

## 2020-10-02 DIAGNOSIS — E1165 Type 2 diabetes mellitus with hyperglycemia: Secondary | ICD-10-CM

## 2020-10-02 DIAGNOSIS — IMO0002 Reserved for concepts with insufficient information to code with codable children: Secondary | ICD-10-CM

## 2020-10-02 MED ORDER — ACCU-CHEK FASTCLIX LANCETS MISC: 1 refills | Status: DC

## 2020-10-18 ENCOUNTER — Other Ambulatory Visit: Payer: Self-pay

## 2020-10-18 DIAGNOSIS — E1169 Type 2 diabetes mellitus with other specified complication: Secondary | ICD-10-CM

## 2020-10-18 DIAGNOSIS — E785 Hyperlipidemia, unspecified: Secondary | ICD-10-CM

## 2020-10-18 MED ORDER — ATORVASTATIN CALCIUM 40 MG PO TABS: ORAL_TABLET | ORAL | 1 refills | Status: DC

## 2020-11-22 ENCOUNTER — Ambulatory Visit (INDEPENDENT_AMBULATORY_CARE_PROVIDER_SITE_OTHER): Payer: Medicare HMO | Admitting: Student

## 2020-11-22 ENCOUNTER — Encounter: Payer: Self-pay | Admitting: Student

## 2020-11-22 ENCOUNTER — Other Ambulatory Visit: Payer: Self-pay

## 2020-11-22 DIAGNOSIS — F320 Major depressive disorder, single episode, mild: Secondary | ICD-10-CM | POA: Diagnosis not present

## 2020-11-22 DIAGNOSIS — Z0000 Encounter for general adult medical examination without abnormal findings: Secondary | ICD-10-CM

## 2020-11-22 DIAGNOSIS — E2839 Other primary ovarian failure: Secondary | ICD-10-CM

## 2020-11-22 NOTE — Progress Notes (Signed)
This AWV is being conducted by [place] only. The patient was located at home and I was located in [facility]. The patient's identity was confirmed using their DOB and current address. The patient or his/her legal guardian has consented to being evaluated through a telephone encounter and understands the associated risks (an examination cannot be done and the patient may need to come in for an appointment) / benefits (allows the patient to remain at home, decreasing exposure to coronavirus). I personally spent 45 minutes conducting the AWV.  Subjective:   Angie Cain is a 64 y.o. female who presents for a [employer] Visit.  The following items have been reviewed and updated today in the appropriate area in the EMR.   Health Risk Assessment  Height, weight, BMI, and BP Visual acuity if needed Depression screen Fall risk / safety level Advance directive discussion Medical and family history were reviewed and updated Updating list of other providers & suppliers Medication reconciliation, including over the counter medicines Cognitive screen Written screening schedule Risk Factor list Personalized health advice, risky behaviors, and treatment advice  Social History   Social History Narrative   Current Social History 11/22/2020        Patient lives with spouse in an apartment which is 1 story. There are no steps up to the entrance.       Patient's method of transportation is personal car.      The highest level of education was high school diploma.      The patient currently disabled.      Identified important Relationships are Husband and sons       Pets : None       Interests / Fun: Arts and crafts, music, reading       Current Stressors: depression       Religious / Personal Beliefs: Christian, non-denominational          Objective:    Vitals: There were no vitals taken for this visit. Vitals are unable to obtained due to WOEHO-12 public  health emergency  Activities of Daily Living In your present state of health, do you have any difficulty performing the following activities: 09/17/2020 12/05/2019  Hearing? N Y  Vision? N N  Difficulty concentrating or making decisions? N N  Walking or climbing stairs? N N  Dressing or bathing? N N  Doing errands, shopping? N N  Some recent data might be hidden    Goals Goals    . Blood Pressure < 140/90    . HEMOGLOBIN A1C < 7.0    . Increase physical activity     Begin seated and standing exercises with the enclosed exercise band.       Marland Kitchen LDL CALC < 100    . Quit smoking / using tobacco       Fall Risk Fall Risk  11/22/2020 09/17/2020 12/05/2019 11/01/2019 09/12/2019  Falls in the past year? 0 0 0 0 0  Comment - - - - -  Number falls in past yr: - - - - -  Injury with Fall? - - - - -  Comment - - - - -  Risk for fall due to : - No Fall Risks - - -  Risk for fall due to: Comment - - - - -  Follow up Falls evaluation completed Falls evaluation completed - - Falls prevention discussed    Depression Screen PHQ 2/9 Scores 11/22/2020 09/17/2020 12/05/2019 11/01/2019  PHQ - 2 Score  4 5 2 3   PHQ- 9 Score 14 17 6 9   Exception Documentation - - - -     Cognitive Testing Six-Item Cognitive Screener   "I would like to ask you some questions that ask you to use your memory. I am going to name three objects. Please wait until I say all three words, then repeat them. Remember what they are  because I am going to ask you to name them again in a few minutes. Please repeat these words for me: APPLE--TABLE--PENNY." (Interviewer may repeat names 3 times if necessary but repetition not scored.)  Did patient correctly repeat all three words? Yes - may proceed with screen  What year is this? Correct What month is this? Correct What day of the week is this? Correct  What were the three objects I asked you to remember? . Apple Correct . Table Correct . Penny Correct  Score one  point for each incorrect answer.  A score of 2 or more points warrants additional investigation.  Patient's score 0   CDC Handout on Fall Prevention and Handout on Home Exercise Program, Access codes [id number] and [id number] mailed to patient with exercise band.      Assessment and Plan:    During the course of the visit the patient was educated and counseled about appropriate screening and preventive services as documented in the assessment and plan.  At the patient's last office visit, she was referred to [facility].  She has not been successful finding a therapist.  Today she was agreeable to see Dr. Theodis Shove in [facility], a referral has been placed.  She scored 14 on her PHQ9, she denies any suicidal ideation.  [facility] urgent phone numbers were given to the patient.  Dexa scan has been ordered.  A follow up appointment was made with Dr. Coy Saunas on 12/23/20 @ 9:45 for labs (A1C,lipid check, PNA vaccine, and podiatry referral).    The printed AVS was given to the patient and included an updated screening schedule, a list of risk factors, and personalized health advice.        Higinio Roger, RN  11/22/2020

## 2020-11-22 NOTE — Patient Instructions (Addendum)
Things That May Be Affecting Your Health:  Alcohol  Hearing loss  Pain   X Depression  Home Safety  Sexual Health  X Diabetes  Lack of physical activity X Stress   Difficulty with daily activities  Loneliness X Tiredness   Drug use  Medicines  Tobacco use   Falls  Motor Vehicle Safety  Weight   Food choices  Oral Health  Other    YOUR PERSONALIZED HEALTH PLAN : 1. Schedule your next subsequent Medicare Wellness visit in one year 2. Attend your next scheduled appointment with Dr. Coy Saunas at [facility] on 12/23/2020 @ 9:45 for a 3 month follow-up (A1C, lipid labwork; podiatry referral, pneumonia vaccine) 3. An order has been placed for a bone density scan.  Please call [phone] to schedule this test. 4.  A referral has been placed to [facility] in the [place].  If you have urgent needs before your appointment with Dr. Theodis Shove, please call [phone]. 5.  Information on smoking cessation has been included in your packet. 6.  Congratulations on setting a goal of improving your lifestyle and increasing your physical activity! Begin seated and standing exercises with the enclosed exercise band. 7.  Have a great time on your cruise!     Annual Wellness Visit                       Medicare Covered Preventative Screenings and Services  Services & Screenings Men and Women Who How Often Need? Date of Last Service Action  Abdominal Aortic Aneurysm Adults with AAA risk factors Once No    Alcohol Misuse and Counseling All Adults Screening once a year if no alcohol misuse. Counseling up to 4 face to face sessions.     Bone Density Measurement  Adults at risk for osteoporosis Once every 2 yrs Yes 05/2016 Last Dexa scan showed osteopenia. Due for one now  Lipid Panel Z13.6 All adults without CV disease Once every 5 yrs Yes 05/2014 Last lipid panel was normal. Due for another.   Colorectal Cancer   Stool  sample or  Colonoscopy All adults 50 and older   Once every year  Every 10 years No 06/2017 They found polyps that were removed. Due for next one in 2023.   Depression All Adults Once a year  Today Very depressed, significant stressors and some grief. Referred to behavioral health. Follow up with referral.   Diabetes Screening Blood glucose, post glucose load, or GTT Z13.1  All adults at risk  Pre-diabetics  Once per year  Twice per year No 09/2020 On a diabetic regimen. A1C not at goal.   Diabetes  Self-Management Training All adults Diabetics 10 hrs first year; 2 hours subsequent years. Requires Copay Yes    Glaucoma  Diabetics  Family history of glaucoma  African Americans 50 yrs +  Hispanic Americans 65 yrs + Annually - requires coppay     Hepatitis C Z72.89 or F19.20  High Risk for HCV  Born between [place]  Annually  Once No 09/2016 Neg for Hep C virus ab.   HIV Z11.4 All adults based on risk  Annually btw ages 36 & 30 regardless of risk  Annually > 65 yrs if at increased risk No 09/2016 Last HIV test was non reactive in 2017. Not at increased risk  Lung Cancer Screening Asymptomatic adults aged 64-77 with 30 pack yr history and current smoker OR quit within the last  15 yrs Annually Must have counseling and shared decision making documentation before first screen     Medical Nutrition Therapy Adults with   Diabetes  Renal disease  Kidney transplant within past 3 yrs 3 hours first year; 2 hours subsequent years     Obesity and Counseling All adults Screening once a year Counseling if BMI 30 or higher  Today   Tobacco Use Counseling Adults who use tobacco  Up to 8 visits in one year     Vaccines Z23  Hepatitis B  Influenza   Pneumonia  Adults   Once  Once every flu season  Two different vaccines separated by one year     Next Annual Wellness Visit People with Medicare Every year  Today     Services &  Screenings Women Who How Often Need  Date of Last Service Action  Mammogram  Z12.31 Women over 79 One baseline ages 71-39. Annually ager 40 yrs+     Pap tests All women Annually if high risk. Every 2 yrs for normal risk women     Screening for cervical cancer with   Pap (Z01.419 nl or Z01.411abnl) &  HPV Z11.51 Women aged 31 to 62 Once every 5 yrs     Screening pelvic and breast exams All women Annually if high risk. Every 2 yrs for normal risk women     Sexually Transmitted Diseases  Chlamydia  Gonorrhea  Syphilis All at risk adults Annually for non pregnant females at increased risk         [place] Men Who How Ofter Need  Date of Last Service Action  Prostate Cancer - DRE & PSA Men over 50 Annually.  DRE might require a copay.     Sexually Transmitted Diseases  Syphilis All at risk adults Annually for men at increased risk        Bone Density Test The bone density test uses a special type of X-ray to measure the amount of calcium and other minerals in your bones. It can measure bone density in the hip and the spine. The test procedure is similar to having a regular X-ray. This test may also be called:  Bone densitometry.  Bone mineral density test.  Dual-energy X-ray absorptiometry (DEXA). You may have this test to:  Diagnose a condition that causes weak or thin bones (osteoporosis).  Screen you for osteoporosis.  Predict your risk for a broken bone (fracture).  Determine how well your osteoporosis treatment is working. Tell a health care provider about:  Any allergies you have.  All medicines you are taking, including vitamins, herbs, eye drops, creams, and over-the-counter medicines.  Any problems you or family members have had with anesthetic medicines.  Any blood disorders you have.  Any surgeries you have had.  Any medical conditions you have.  Whether you are pregnant or may be pregnant.  Any medical  tests you have had within the past 14 days that used contrast material. What are the risks? Generally, this is a safe procedure. However, it does expose you to a small amount of radiation, which can slightly increase your cancer risk. What happens before the procedure?  Do not take any calcium supplements starting 24 hours before your test.  Remove all metal jewelry, eyeglasses, dental appliances, and any other metal objects. What happens during the procedure?   You will lie down on an exam table. There will be an X-ray generator below you and an imaging device above you.  Other  devices, such as boxes or braces, may be used to position your body properly for the scan.  The machine will slowly scan your body. You will need to keep still.  The images will show up on a screen in the room. Images will be examined by a specialist after your test is done. The procedure may vary among health care providers and hospitals. What happens after the procedure?  It is up to you to get your test results. Ask your health care provider, or the department that is doing the test, when your results will be ready. Summary  A bone density test is an imaging test that uses a type of X-ray to measure the amount of calcium and other minerals in your bones.  The test may be used to diagnose or screen you for a condition that causes weak or thin bones (osteoporosis), predict your risk for a broken bone (fracture), or determine how well your osteoporosis treatment is working.  Do not take any calcium supplements starting 24 hours before your test.  Ask your health care provider, or the department that is doing the test, when your results will be ready. This information is not intended to replace advice given to you by your health care provider. Make sure you discuss any questions you have with your health care provider. Document Revised: 12/16/2017 Document Reviewed: 10/04/2017 Elsevier Patient Education  [place].   Diabetes Mellitus and [place] care is an important part of your health, especially when you have diabetes. Diabetes may cause you to have problems because of poor blood flow (circulation) to your feet and legs, which can cause your skin to:  Become thinner and drier.  Break more easily.  Heal more slowly.  Peel and crack. You may also have nerve damage (neuropathy) in your legs and feet, causing decreased feeling in them. This means that you may not notice minor injuries to your feet that could lead to more serious problems. Noticing and addressing any potential problems early is the best way to prevent future foot problems. How to care for your feet Foot hygiene  Wash your feet daily with warm water and mild soap. Do not use hot water. Then, pat your feet and the areas between your toes until they are completely dry. Do not soak your feet as this can dry your skin.  Trim your toenails straight across. Do not dig under them or around the cuticle. File the edges of your nails with an emery board or nail file.  Apply a moisturizing lotion or petroleum jelly to the skin on your feet and to dry, brittle toenails. Use lotion that does not contain alcohol and is unscented. Do not apply lotion between your toes. Shoes and socks  Wear clean socks or stockings every day. Make sure they are not too tight. Do not wear knee-high stockings since they may decrease blood flow to your legs.  Wear shoes that fit properly and have enough cushioning. Always look in your shoes before you put them on to be sure there are no objects inside.  To break in new shoes, wear them for just a few hours a day. This prevents injuries on your feet. Wounds, scrapes, corns, and calluses  Check your feet daily for blisters, cuts, bruises, sores, and redness. If you cannot see the bottom of your feet, use a mirror or ask someone for help.  Do not cut corns or calluses or try to remove them with  medicine.  If you find a minor scrape, cut, or break in the skin on your feet, keep it and the skin around it clean and dry. You may clean these areas with mild soap and water. Do not clean the area with peroxide, alcohol, or iodine.  If you have a wound, scrape, corn, or callus on your foot, look at it several times a day to make sure it is healing and not infected. Check for: ? Redness, swelling, or pain. ? Fluid or blood. ? Warmth. ? Pus or a bad smell. General instructions  Do not cross your legs. This may decrease blood flow to your feet.  Do not use heating pads or hot water bottles on your feet. They may burn your skin. If you have lost feeling in your feet or legs, you may not know this is happening until it is too late.  Protect your feet from hot and cold by wearing shoes, such as at the beach or on hot pavement.  Schedule a complete foot exam at least once a year (annually) or more often if you have foot problems. If you have foot problems, report any cuts, sores, or bruises to your health care provider immediately. Contact a health care provider if:  You have a medical condition that increases your risk of infection and you have any cuts, sores, or bruises on your feet.  You have an injury that is not healing.  You have redness on your legs or feet.  You feel burning or tingling in your legs or feet.  You have pain or cramps in your legs and feet.  Your legs or feet are numb.  Your feet always feel cold.  You have pain around a toenail. Get help right away if:  You have a wound, scrape, corn, or callus on your foot and: ? You have pain, swelling, or redness that gets worse. ? You have fluid or blood coming from the wound, scrape, corn, or callus. ? Your wound, scrape, corn, or callus feels warm to the touch. ? You have pus or a bad smell coming from the wound, scrape, corn, or callus. ? You have a fever. ? You have a red line going up your leg. Summary  Check  your feet every day for cuts, sores, red spots, swelling, and blisters.  Moisturize feet and legs daily.  Wear shoes that fit properly and have enough cushioning.  If you have foot problems, report any cuts, sores, or bruises to your health care provider immediately.  Schedule a complete foot exam at least once a year (annually) or more often if you have foot problems. This information is not intended to replace advice given to you by your health care provider. Make sure you discuss any questions you have with your health care provider. Document Revised: 08/23/2019 Document Reviewed: 01/01/2017 Elsevier Patient Education  [place] Prevention in the Home, Adult Falls can cause injuries. They can happen to people of all ages. There are many things you can do to make your home safe and to help prevent falls. Ask for help when making these changes, if needed. What actions can I take to prevent falls? General Instructions  Use good lighting in all rooms. Replace any light bulbs that burn out.  Turn on the lights when you go into a dark area. Use night-lights.  Keep items that you use often in easy-to-reach places. Lower the shelves around your home if necessary.  Set up your furniture so  you have a clear path. Avoid moving your furniture around.  Do not have throw rugs and other things on the floor that can make you trip.  Avoid walking on wet floors.  If any of your floors are uneven, fix them.  Add color or contrast paint or tape to clearly mark and help you see: ? Any grab bars or handrails. ? First and last steps of stairways. ? Where the edge of each step is.  If you use a stepladder: ? Make sure that it is fully opened. Do not climb a closed stepladder. ? Make sure that both sides of the stepladder are locked into place. ? Ask someone to hold the stepladder for you while you use it.  If there are any pets around you, be aware of where they are. What can  I do in the bathroom?      Keep the floor dry. Clean up any water that spills onto the floor as soon as it happens.  Remove soap buildup in the tub or shower regularly.  Use non-skid mats or decals on the floor of the tub or shower.  Attach bath mats securely with double-sided, non-slip rug tape.  If you need to sit down in the shower, use a plastic, non-slip stool.  Install grab bars by the toilet and in the tub and shower. Do not use towel bars as grab bars. What can I do in the bedroom?  Make sure that you have a light by your bed that is easy to reach.  Do not use any sheets or blankets that are too big for your bed. They should not hang down onto the floor.  Have a firm chair that has side arms. You can use this for support while you get dressed. What can I do in the kitchen?  Clean up any spills right away.  If you need to reach something above you, use a strong step stool that has a grab bar.  Keep electrical cords out of the way.  Do not use floor polish or wax that makes floors slippery. If you must use wax, use non-skid floor wax. What can I do with my stairs?  Do not leave any items on the stairs.  Make sure that you have a light switch at the top of the stairs and the bottom of the stairs. If you do not have them, ask someone to add them for you.  Make sure that there are handrails on both sides of the stairs, and use them. Fix handrails that are broken or loose. Make sure that handrails are as long as the stairways.  Install non-slip stair treads on all stairs in your home.  Avoid having throw rugs at the top or bottom of the stairs. If you do have throw rugs, attach them to the floor with carpet tape.  Choose a carpet that does not hide the edge of the steps on the stairway.  Check any carpeting to make sure that it is firmly attached to the stairs. Fix any carpet that is loose or worn. What can I do on the outside of my home?  Use bright outdoor  lighting.  Regularly fix the edges of walkways and driveways and fix any cracks.  Remove anything that might make you trip as you walk through a door, such as a raised step or threshold.  Trim any bushes or trees on the path to your home.  Regularly check to see if handrails are loose or broken.  Make sure that both sides of any steps have handrails.  Install guardrails along the edges of any raised decks and porches.  Clear walking paths of anything that might make someone trip, such as tools or rocks.  Have any leaves, snow, or ice cleared regularly.  Use sand or salt on walking paths during winter.  Clean up any spills in your garage right away. This includes grease or oil spills. What other actions can I take?  Wear shoes that: ? Have a low heel. Do not wear high heels. ? Have rubber bottoms. ? Are comfortable and fit you well. ? Are closed at the toe. Do not wear open-toe sandals.  Use tools that help you move around (mobility aids) if they are needed. These include: ? Canes. ? Walkers. ? Scooters. ? Crutches.  Review your medicines with your doctor. Some medicines can make you feel dizzy. This can increase your chance of falling. Ask your doctor what other things you can do to help prevent falls. Where to find more information  Centers for Disease Control and Prevention, [facility]: https://garcia.biz/  [employer] on Aging: BrainJudge.co.uk Contact a doctor if:  You are afraid of falling at home.  You feel weak, drowsy, or dizzy at home.  You fall at home. Summary  There are many simple things that you can do to make your home safe and to help prevent falls.  Ways to make your home safe include removing tripping hazards and installing grab bars in the bathroom.  Ask for help when making these changes in your home. This information is not intended to replace advice given to you by your health care provider. Make sure you discuss any questions you  have with your health care provider. Document Revised: 03/23/2019 Document Reviewed: 07/15/2017 Elsevier Patient Education  2020 [place] Maintenance, Female Adopting a healthy lifestyle and getting preventive care are important in promoting health and wellness. Ask your health care provider about:  The right schedule for you to have regular tests and exams.  Things you can do on your own to prevent diseases and keep yourself healthy. What should I know about diet, weight, and exercise? Eat a healthy diet   Eat a diet that includes plenty of vegetables, fruits, low-fat dairy products, and lean protein.  Do not eat a lot of foods that are high in solid fats, added sugars, or sodium. Maintain a healthy weight Body mass index (BMI) is used to identify weight problems. It estimates body fat based on height and weight. Your health care provider can help determine your BMI and help you achieve or maintain a healthy weight. Get regular exercise Get regular exercise. This is one of the most important things you can do for your health. Most adults should:  Exercise for at least 150 minutes each week. The exercise should increase your heart rate and make you sweat (moderate-intensity exercise).  Do strengthening exercises at least twice a week. This is in addition to the moderate-intensity exercise.  Spend less time sitting. Even light physical activity can be beneficial. Watch cholesterol and blood lipids Have your blood tested for lipids and cholesterol at 66 years of age, then have this test every 5 years. Have your cholesterol levels checked more often if:  Your lipid or cholesterol levels are high.  You are older than 66 years of age.  You are at high risk for heart disease. What should I know about cancer screening? Depending on your health history and  family history, you may need to have cancer screening at various ages. This may include screening for:  Breast  cancer.  Cervical cancer.  Colorectal cancer.  Skin cancer.  Lung cancer. What should I know about heart disease, diabetes, and high blood pressure? Blood pressure and heart disease  High blood pressure causes heart disease and increases the risk of stroke. This is more likely to develop in people who have high blood pressure readings, are of African descent, or are overweight.  Have your blood pressure checked: ? Every 3-5 years if you are 58-3 years of age. ? Every year if you are 27 years old or older. Diabetes Have regular diabetes screenings. This checks your fasting blood sugar level. Have the screening done:  Once every three years after age 24 if you are at a normal weight and have a low risk for diabetes.  More often and at a younger age if you are overweight or have a high risk for diabetes. What should I know about preventing infection? Hepatitis B If you have a higher risk for hepatitis B, you should be screened for this virus. Talk with your health care provider to find out if you are at risk for hepatitis B infection. Hepatitis C Testing is recommended for:  Everyone born from [birth year] through 1967.  Anyone with known risk factors for hepatitis C. Sexually transmitted infections (STIs)  Get screened for STIs, including gonorrhea and chlamydia, if: ? You are sexually active and are younger than 66 years of age. ? You are older than 66 years of age and your health care provider tells you that you are at risk for this type of infection. ? Your sexual activity has changed since you were last screened, and you are at increased risk for chlamydia or gonorrhea. Ask your health care provider if you are at risk.  Ask your health care provider about whether you are at high risk for HIV. Your health care provider may recommend a prescription medicine to help prevent HIV infection. If you choose to take medicine to prevent HIV, you should first get tested for HIV. You should  then be tested every 3 months for as long as you are taking the medicine. Pregnancy  If you are about to stop having your period (premenopausal) and you may become pregnant, seek counseling before you get pregnant.  Take 400 to 800 micrograms (mcg) of folic acid every day if you become pregnant.  Ask for birth control (contraception) if you want to prevent pregnancy. Osteoporosis and menopause Osteoporosis is a disease in which the bones lose minerals and strength with aging. This can result in bone fractures. If you are 82 years old or older, or if you are at risk for osteoporosis and fractures, ask your health care provider if you should:  Be screened for bone loss.  Take a calcium or vitamin D supplement to lower your risk of fractures.  Be given hormone replacement therapy (HRT) to treat symptoms of menopause. Follow these instructions at home: Lifestyle  Do not use any products that contain nicotine or tobacco, such as cigarettes, e-cigarettes, and chewing tobacco. If you need help quitting, ask your health care provider.  Do not use street drugs.  Do not share needles.  Ask your health care provider for help if you need support or information about quitting drugs. Alcohol use  Do not drink alcohol if: ? Your health care provider tells you not to drink. ? You are pregnant, may be pregnant,  or are planning to become pregnant.  If you drink alcohol: ? Limit how much you use to 0-1 drink a day. ? Limit intake if you are breastfeeding.  Be aware of how much alcohol is in your drink. In the [place] one drink equals one 12 oz bottle of beer (355 mL), one 5 oz glass of wine (148 mL), or one 1 oz glass of hard liquor (44 mL). General instructions  Schedule regular health, dental, and eye exams.  Stay current with your vaccines.  Tell your health care provider if: ? You often feel depressed. ? You have ever been abused or do not feel safe at home. Summary  Adopting a  healthy lifestyle and getting preventive care are important in promoting health and wellness.  Follow your health care provider's instructions about healthy diet, exercising, and getting tested or screened for diseases.  Follow your health care provider's instructions on monitoring your cholesterol and blood pressure. This information is not intended to replace advice given to you by your health care provider. Make sure you discuss any questions you have with your health care provider. Document Revised: 11/23/2018 Document Reviewed: 11/23/2018 Elsevier Patient Education  2020 [employer].   Steps to Quit Smoking Smoking tobacco is the leading cause of preventable death. It can affect almost every organ in the body. Smoking puts you and people around you at risk for many serious, long-lasting (chronic) diseases. Quitting smoking can be hard, but it is one of the best things that you can do for your health. It is never too late to quit. How do I get ready to quit? When you decide to quit smoking, make a plan to help you succeed. Before you quit:  Pick a date to quit. Set a date within the next 2 weeks to give you time to prepare.  Write down the reasons why you are quitting. Keep this list in places where you will see it often.  Tell your family, friends, and co-workers that you are quitting. Their support is important.  Talk with your doctor about the choices that may help you quit.  Find out if your health insurance will pay for these treatments.  Know the people, places, things, and activities that make you want to smoke (triggers). Avoid them. What first steps can I take to quit smoking?  Throw away all cigarettes at home, at work, and in your car.  Throw away the things that you use when you smoke, such as ashtrays and lighters.  Clean your car. Make sure to empty the ashtray.  Clean your home, including curtains and carpets. What can I do to help me quit smoking? Talk with  your doctor about taking medicines and seeing a counselor at the same time. You are more likely to succeed when you do both.  If you are pregnant or breastfeeding, talk with your doctor about counseling or other ways to quit smoking. Do not take medicine to help you quit smoking unless your doctor tells you to do so. To quit smoking: Quit right away  Quit smoking totally, instead of slowly cutting back on how much you smoke over a period of time.  Go to counseling. You are more likely to quit if you go to counseling sessions regularly. Take medicine You may take medicines to help you quit. Some medicines need a prescription, and some you can buy over-the-counter. Some medicines may contain a drug called nicotine to replace the nicotine in cigarettes. Medicines may:  Help you  to stop having the desire to smoke (cravings).  Help to stop the problems that come when you stop smoking (withdrawal symptoms). Your doctor may ask you to use:  Nicotine patches, gum, or lozenges.  Nicotine inhalers or sprays.  Non-nicotine medicine that is taken by mouth. Find resources Find resources and other ways to help you quit smoking and remain smoke-free after you quit. These resources are most helpful when you use them often. They include:  Online chats with a Social worker.  Phone quitlines.  Printed Furniture conservator/restorer.  Support groups or group counseling.  Text messaging programs.  Mobile phone apps. Use apps on your mobile phone or tablet that can help you stick to your quit plan. There are many free apps for mobile phones and tablets as well as websites. Examples include Quit Guide from the [employer] and smokefree.gov  What things can I do to make it easier to quit?   Talk to your family and friends. Ask them to support and encourage you.  Call a phone quitline (1-800-QUIT-NOW), reach out to support groups, or work with a Social worker.  Ask people who smoke to not smoke around you.  Avoid places  that make you want to smoke, such as: ? Bars. ? Parties. ? Smoke-break areas at work.  Spend time with people who do not smoke.  Lower the stress in your life. Stress can make you want to smoke. Try these things to help your stress: ? Getting regular exercise. ? Doing deep-breathing exercises. ? Doing yoga. ? Meditating. ? Doing a body scan. To do this, close your eyes, focus on one area of your body at a time from head to toe. Notice which parts of your body are tense. Try to relax the muscles in those areas. How will I feel when I quit smoking? Day 1 to 3 weeks Within the first 24 hours, you may start to have some problems that come from quitting tobacco. These problems are very bad 2-3 days after you quit, but they do not often last for more than 2-3 weeks. You may get these symptoms:  Mood swings.  Feeling restless, nervous, angry, or annoyed.  Trouble concentrating.  Dizziness.  Strong desire for high-sugar foods and nicotine.  Weight gain.  Trouble pooping (constipation).  Feeling like you may vomit (nausea).  Coughing or a sore throat.  Changes in how the medicines that you take for other issues work in your body.  Depression.  Trouble sleeping (insomnia). Week 3 and afterward After the first 2-3 weeks of quitting, you may start to notice more positive results, such as:  Better sense of smell and taste.  Less coughing and sore throat.  Slower heart rate.  Lower blood pressure.  Clearer skin.  Better breathing.  Fewer sick days. Quitting smoking can be hard. Do not give up if you fail the first time. Some people need to try a few times before they succeed. Do your best to stick to your quit plan, and talk with your doctor if you have any questions or concerns. Summary  Smoking tobacco is the leading cause of preventable death. Quitting smoking can be hard, but it is one of the best things that you can do for your health.  When you decide to quit  smoking, make a plan to help you succeed.  Quit smoking right away, not slowly over a period of time.  When you start quitting, seek help from your doctor, family, or friends. This information is not intended to  replace advice given to you by your health care provider. Make sure you discuss any questions you have with your health care provider. Document Revised: 08/25/2019 Document Reviewed: 02/18/2019 Elsevier Patient Education  [place].

## 2020-11-22 NOTE — Progress Notes (Signed)
I discussed the AWV findings with the RN who conducted the visit. I was present in the office suite and immediately available to provide assistance and direction throughout the time the service was provided.  Mitzi Hansen, MD Internal Medicine Resident PGY-2 [facility] Pager: [phone] 11/22/2020 2:03 PM

## 2020-11-25 NOTE — Progress Notes (Signed)
Internal Medicine Clinic Attending  Case discussed with Dr.  Christian .  We reviewed the AWV findings.  I agree with the assessment, diagnosis, and plan of care documented in the AWV note.     

## 2020-11-25 NOTE — Progress Notes (Signed)
Internal Medicine Clinic Attending  Case discussed with Dr. Darrick Meigs .  We reviewed the AWV findings.  I agree with the assessment, diagnosis, and plan of care documented in the AWV note.

## 2020-11-28 ENCOUNTER — Encounter: Payer: Self-pay | Admitting: Student

## 2020-12-02 ENCOUNTER — Encounter (HOSPITAL_COMMUNITY): Payer: Self-pay | Admitting: Emergency Medicine

## 2020-12-02 ENCOUNTER — Other Ambulatory Visit: Payer: Self-pay

## 2020-12-02 ENCOUNTER — Ambulatory Visit (HOSPITAL_COMMUNITY)
Admission: EM | Admit: 2020-12-02 | Discharge: 2020-12-02 | Disposition: A | Discharge status: Home / Self Care | Payer: Medicare HMO | Attending: Family Medicine | Admitting: Family Medicine

## 2020-12-02 DIAGNOSIS — U071 COVID-19: Secondary | ICD-10-CM | POA: Insufficient documentation

## 2020-12-02 DIAGNOSIS — R6883 Chills (without fever): Secondary | ICD-10-CM | POA: Diagnosis not present

## 2020-12-02 DIAGNOSIS — Z20822 Contact with and (suspected) exposure to covid-19: Secondary | ICD-10-CM | POA: Diagnosis not present

## 2020-12-02 LAB — SARS CORONAVIRUS 2 (TAT 6-24 HRS): SARS Coronavirus 2: POSITIVE — AB

## 2020-12-02 NOTE — Discharge Instructions (Addendum)
You have been tested for COVID-19 today. °If your test returns positive, you will receive a phone call from [facility] regarding your results. °Negative test results are not called. °Both positive and negative results area always visible on MyChart. °If you do not have a MyChart account, sign up instructions are provided in your discharge papers. °Please do not hesitate to contact us should you have questions or concerns. ° °

## 2020-12-02 NOTE — ED Provider Notes (Signed)
[facility]   [id number] 12/02/20 Arrival Time: 0086  ASSESSMENT & PLAN:  1. Exposure to COVID-19 virus   2. Chills     COVID-19 testing sent. See letter/work note on file for self-isolation guidelines. OTC symptom care as needed.   Follow-up Information    [place], Charisse March, MD.   Specialty: Internal Medicine Why: As needed. Contact information: [place] [address] [phone]               Reviewed expectations re: course of current medical issues. Questions answered. Outlined signs and symptoms indicating need for more acute intervention. Understanding verbalized. After Visit Summary given.   SUBJECTIVE: History from: patient. Angie Cain is a 64 y.o. female who reports COVID exp; family member. Chills for a few days; decreased appetite. Is vaccinated. Denies: fever and difficulty breathing. Normal PO intake without n/v/d.    OBJECTIVE:  Vitals:   12/02/20 1014 12/02/20 1016  BP:  138/79  Pulse:  67  Resp:  17  Temp:  97.6 F (36.4 C)  TempSrc:  Oral  SpO2:  100%  Weight: 65.8 kg   Height: 5\' 5"  (1.651 m)     General appearance: alert; no distress Eyes: PERRLA; EOMI; conjunctiva normal HENT: [place]; AT Neck: supple  Lungs: speaks full sentences without difficulty; unlabored Extremities: no edema Skin: warm and dry Neurologic: normal gait Psychological: alert and cooperative; normal mood and affect  Labs:  Labs Reviewed  SARS CORONAVIRUS 2 (TAT 6-24 HRS)      Allergies  Allergen Reactions  . Bupropion Hives and Swelling  . Metronidazole Itching  . Shellfish Allergy Itching    Mouth irritations    Past Medical History:  Diagnosis Date  . Allergy   . Anxiety    occasional  . Arthritis   . Atherosclerotic peripheral vascular disease with intermittent claudication ([place])    s/p fem-fem bypass 2011. ABI 07/2012 0.5 R 0.65 L  . Diabetes mellitus without complication ([place])   . GERD (gastroesophageal  reflux disease)   . Hyperlipidemia   . Hypertension   . IDDM (insulin dependent diabetes mellitus) 07/21/2007   July 2011: Femoral-femoral bypass  ABI 08/11/2012 notable for 0.4-0.59 on right ankle suggestive of severe arterial occlusive disease at rest and 0.60-0.7 on left ankle suggestive of moderate arterial occlusive disease at rest.   . Personal history of colonic polyps - adenomas 03/07/2014  . Tobacco abuse    Social History   Socioeconomic History  . Marital status: Married    Spouse name: Not on file  . Number of children: Not on file  . Years of education: Not on file  . Highest education level: Not on file  Occupational History  . Not on file  Tobacco Use  . Smoking status: Current Every Day Smoker    Packs/day: 0.10    Types: Cigarettes  . Smokeless tobacco: Never Used  . Tobacco comment: Smokes 2-4 cigs/day.  Vaping Use  . Vaping Use: Never used  Substance and Sexual Activity  . Alcohol use: Yes    Alcohol/week: 1.0 - 2.0 standard drink    Types: 1 - 2 Glasses of wine per week    Comment: weekly  . Drug use: No  . Sexual activity: Yes    Partners: Male  Other Topics Concern  . Not on file  Social History Narrative   Current Social History 11/22/2020        Patient lives with spouse in an apartment which is  1 story. There are no steps up to the entrance.       Patient's method of transportation is personal car.      The highest level of education was high school diploma.      The patient currently disabled.      Identified important Relationships are Husband and sons       Pets : None       Interests / Fun: Arts and crafts, music, reading       Current Stressors: depression       Religious / Personal Beliefs: Christian, non-denominational    Social Determinants of Health   Financial Resource Strain: Not on file  Food Insecurity: Not on file  Transportation Needs: Not on file  Physical Activity: Not on file  Stress: Not on file  Social  Connections: Not on file  Intimate Partner Violence: Not on file   Family History  Problem Relation Age of Onset  . Diabetes Mother   . Diabetes Father   . Diabetes Sister   . Hypertension Sister   . Diabetes Brother   . Hypertension Brother   . Diabetes Son   . Diabetes Brother   . Diabetes Brother   . Diabetes Brother   . Diabetes Brother   . Diabetes Brother   . Colon cancer Neg Hx   . Esophageal cancer Neg Hx   . Stomach cancer Neg Hx   . Rectal cancer Neg Hx   . Breast cancer Neg Hx    Past Surgical History:  Procedure Laterality Date  . VAGINAL HYSTERECTOMY    . 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

## 2020-12-02 NOTE — ED Triage Notes (Signed)
Patient c/o chills and loss of appetite since last week.   Patient endorses having a recent COVID exposure to a family member.   Patient denies any other complaints or symptoms.   Patient hasn't used any treatment methods.

## 2020-12-03 ENCOUNTER — Other Ambulatory Visit: Payer: Self-pay | Admitting: Physician Assistant

## 2020-12-03 ENCOUNTER — Ambulatory Visit (HOSPITAL_COMMUNITY)
Admission: RE | Admit: 2020-12-03 | Discharge: 2020-12-03 | Disposition: A | Discharge status: Home / Self Care | Payer: Medicare Other | Source: Ambulatory Visit | Attending: Pulmonary Disease | Admitting: Pulmonary Disease

## 2020-12-03 ENCOUNTER — Telehealth: Payer: Self-pay | Admitting: Physician Assistant

## 2020-12-03 DIAGNOSIS — E1165 Type 2 diabetes mellitus with hyperglycemia: Secondary | ICD-10-CM | POA: Insufficient documentation

## 2020-12-03 DIAGNOSIS — I10 Essential (primary) hypertension: Secondary | ICD-10-CM | POA: Diagnosis present

## 2020-12-03 DIAGNOSIS — U071 COVID-19: Secondary | ICD-10-CM

## 2020-12-03 DIAGNOSIS — F17200 Nicotine dependence, unspecified, uncomplicated: Secondary | ICD-10-CM | POA: Insufficient documentation

## 2020-12-03 DIAGNOSIS — Z794 Long term (current) use of insulin: Secondary | ICD-10-CM | POA: Diagnosis present

## 2020-12-03 DIAGNOSIS — IMO0002 Reserved for concepts with insufficient information to code with codable children: Secondary | ICD-10-CM

## 2020-12-03 DIAGNOSIS — Z23 Encounter for immunization: Secondary | ICD-10-CM | POA: Diagnosis not present

## 2020-12-03 MED ORDER — ALBUTEROL SULFATE HFA 108 (90 BASE) MCG/ACT IN AERS: 2.0000 | INHALATION_SPRAY | Freq: Once | RESPIRATORY_TRACT | Status: DC | PRN

## 2020-12-03 MED ORDER — DIPHENHYDRAMINE HCL 50 MG/ML IJ SOLN: 50.0000 mg | Freq: Once | INTRAMUSCULAR | Status: DC | PRN

## 2020-12-03 MED ORDER — FAMOTIDINE IN NACL 20-0.9 MG/50ML-% IV SOLN: 20.0000 mg | Freq: Once | INTRAVENOUS | Status: DC | PRN

## 2020-12-03 MED ORDER — SODIUM CHLORIDE 0.9 % IV SOLN: INTRAVENOUS | Status: DC | PRN

## 2020-12-03 MED ORDER — SODIUM CHLORIDE 0.9 % IV SOLN: Freq: Once | INTRAVENOUS | Status: AC

## 2020-12-03 MED ORDER — METHYLPREDNISOLONE SODIUM SUCC 125 MG IJ SOLR: 125.0000 mg | Freq: Once | INTRAMUSCULAR | Status: DC | PRN

## 2020-12-03 MED ORDER — EPINEPHRINE 0.3 MG/0.3ML IJ SOAJ: 0.3000 mg | Freq: Once | INTRAMUSCULAR | Status: DC | PRN

## 2020-12-03 NOTE — Progress Notes (Signed)
I connected by phone with Angie Cain on 12/03/2020 at 9:13 AM to discuss the potential use of a new treatment for mild to moderate COVID-19 viral infection in non-hospitalized patients.  This patient is a 64 y.o. female that meets the FDA criteria for Emergency Use Authorization of COVID monoclonal antibody sotrovimab, casirivimab/imdevimab or bamlamivimab/estevimab.  Has a (+) direct SARS-CoV-2 viral test result  Has mild or moderate COVID-19   Is NOT hospitalized due to COVID-19  Is within 10 days of symptom onset  Has at least one of the high risk factor(s) for progression to severe COVID-19 and/or hospitalization as defined in EUA.  Specific high risk criteria : Older age (>/= 65 yo), BMI > 25, Diabetes, Cardiovascular disease or hypertension, Chronic Lung Disease and Other high risk medical condition per CDC:  high SVI   I have spoken and communicated the following to the patient or parent/caregiver regarding COVID monoclonal antibody treatment:  1. FDA has authorized the emergency use for the treatment of mild to moderate COVID-19 in adults and pediatric patients with positive results of direct SARS-CoV-2 viral testing who are 12 years of age and older weighing at least 40 kg, and who are at high risk for progressing to severe COVID-19 and/or hospitalization.  2. The significant known and potential risks and benefits of COVID monoclonal antibody, and the extent to which such potential risks and benefits are unknown.  3. Information on available alternative treatments and the risks and benefits of those alternatives, including clinical trials.  4. Patients treated with COVID monoclonal antibody should continue to self-isolate and use infection control measures (e.g., wear mask, isolate, social distance, avoid sharing personal items, clean and disinfect "high touch" surfaces, and frequent handwashing) according to CDC guidelines.   5. The patient or parent/caregiver has the  option to accept or refuse COVID monoclonal antibody treatment.  After reviewing this information with the patient, the patient has agreed to receive one of the available covid 19 monoclonal antibodies and will be provided an appropriate fact sheet prior to infusion.  Sx onset 12/14. Set up for infusion on 12/21 @ 4:30pm. Directions given to [facility]. Pt is aware that insurance will be charged an infusion fee. Pt is fully vaccinated. + in epic.   Angie Cain 12/03/2020 9:13 AM     

## 2020-12-03 NOTE — Discharge Instructions (Signed)
If you have any questions or concerns after the infusion please call the Advanced Practice Provider on call at [phone]. This number is ONLY intended for your use regarding questions or concerns about the infusion post-treatment side-effects.  Please do not provide this number to others for use. For return to work notes please contact your primary care provider.  ° °If someone you know is interested in receiving treatment please have them call the COVID hotline at [phone]. ° ° ° ° ° ° °What types of side effects do monoclonal antibody drugs cause?  °Common side effects ° °In general, the more common side effects caused by monoclonal antibody drugs include: °• Allergic reactions, such as hives or itching °• Flu-like signs and symptoms, including chills, fatigue, fever, and muscle aches and pains °• Nausea, vomiting °• Diarrhea °• Skin rashes °• Low blood pressure ° ° °The CDC is recommending patients who receive monoclonal antibody treatments wait at least 90 days before being vaccinated. ° °Currently, there are no data on the safety and efficacy of mRNA COVID-19 vaccines in persons who received monoclonal antibodies or convalescent plasma as part of COVID-19 treatment. Based on the estimated half-life of such therapies as well as evidence suggesting that reinfection is uncommon in the 90 days after initial infection, vaccination should be deferred for at least 90 days, as a precautionary measure until additional information becomes available, to avoid interference of the antibody treatment with vaccine-induced immune responses. ° ° ° ° ° ° °10 Things You Can Do to Manage Your COVID-19 Symptoms at Home °If you have possible or confirmed COVID-19: °1. Stay home from work and school. And stay away from other public places. If you must go out, avoid using any kind of public transportation, ridesharing, or taxis. °2. Monitor your symptoms carefully. If your symptoms get worse, call your healthcare provider  immediately. °3. Get rest and stay hydrated. °4. If you have a medical appointment, call the healthcare provider ahead of time and tell them that you have or may have COVID-19. °5. For medical emergencies, call 911 and notify the dispatch personnel that you have or may have COVID-19. °6. Cover your cough and sneezes with a tissue or use the inside of your elbow. °7. Wash your hands often with soap and water for at least 20 seconds or clean your hands with an alcohol-based hand sanitizer that contains at least 60% alcohol. °8. As much as possible, stay in a specific room and away from other people in your home. Also, you should use a separate bathroom, if available. If you need to be around other people in or outside of the home, wear a mask. °9. Avoid sharing personal items with other people in your household, like dishes, towels, and bedding. °10. Clean all surfaces that are touched often, like counters, tabletops, and doorknobs. Use household cleaning sprays or wipes according to the label instructions. °cdc.gov/coronavirus °06/14/2019 °This information is not intended to replace advice given to you by your health care provider. Make sure you discuss any questions you have with your health care provider. °Document Revised: 11/16/2019 Document Reviewed: 11/16/2019 °Elsevier Patient Education © 2020 Elsevier Inc. ° °

## 2020-12-03 NOTE — Telephone Encounter (Signed)
I connected by phone with Angie Cain on 12/03/2020 at 9:13 AM to discuss the potential use of a new treatment for mild to moderate COVID-19 viral infection in non-hospitalized patients.  This patient is a 64 y.o. female that meets the FDA criteria for Emergency Use Authorization of COVID monoclonal antibody sotrovimab, casirivimab/imdevimab or bamlamivimab/estevimab.  Has a (+) direct SARS-CoV-2 viral test result  Has mild or moderate COVID-19   Is NOT hospitalized due to COVID-19  Is within 10 days of symptom onset  Has at least one of the high risk factor(s) for progression to severe COVID-19 and/or hospitalization as defined in EUA.  Specific high risk criteria : Older age (>/= 66 yo), BMI > 25, Diabetes, Cardiovascular disease or hypertension, Chronic Lung Disease and Other high risk medical condition per CDC:  high SVI   I have spoken and communicated the following to the patient or parent/caregiver regarding COVID monoclonal antibody treatment:  1. FDA has authorized the emergency use for the treatment of mild to moderate COVID-19 in adults and pediatric patients with positive results of direct SARS-CoV-2 viral testing who are 31 years of age and older weighing at least 40 kg, and who are at high risk for progressing to severe COVID-19 and/or hospitalization.  2. The significant known and potential risks and benefits of COVID monoclonal antibody, and the extent to which such potential risks and benefits are unknown.  3. Information on available alternative treatments and the risks and benefits of those alternatives, including clinical trials.  4. Patients treated with COVID monoclonal antibody should continue to self-isolate and use infection control measures (e.g., wear mask, isolate, social distance, avoid sharing personal items, clean and disinfect "high touch" surfaces, and frequent handwashing) according to CDC guidelines.   5. The patient or parent/caregiver has the  option to accept or refuse COVID monoclonal antibody treatment.  After reviewing this information with the patient, the patient has agreed to receive one of the available covid 19 monoclonal antibodies and will be provided an appropriate fact sheet prior to infusion.  Sx onset 12/14. Set up for infusion on 12/21 @ 4:30pm. Directions given to [facility]. Pt is aware that insurance will be charged an infusion fee. Pt is fully vaccinated. + in epic.   [name] 12/03/2020 9:13 AM

## 2020-12-03 NOTE — Progress Notes (Signed)
  Diagnosis: COVID-19  Physician: Dr. Joya Gaskins  Procedure: [facility] Med: bamlanivimab\etesevimab infusion - Provided patient with bamlanimivab\etesevimab fact sheet for patients, parents and caregivers prior to infusion.  Complications: No immediate complications noted.  Discharge: Discharged home   Tia Masker 12/03/2020

## 2020-12-11 ENCOUNTER — Other Ambulatory Visit: Payer: Self-pay

## 2020-12-11 ENCOUNTER — Ambulatory Visit: Payer: Medicare Other | Admitting: Behavioral Health

## 2020-12-11 DIAGNOSIS — F320 Major depressive disorder, single episode, mild: Secondary | ICD-10-CM

## 2020-12-11 NOTE — BH Specialist Note (Signed)
Integrated Behavioral Health via Telemedicine Visit  12/11/2020 Taeya Theall [id number]  Number of Integrated Behavioral Health visits: 1/6 Session Start time: 1:50pm  Session End time: 2:30pm Total time: 40   Referring Provider: Dr. Charissa Bash, MD Patient/Family location: Pt is home in private [facility] Provider location: [facility] Office All persons participating in visit: Pt & Clinician Types of Service: Individual psychotherapy  I connected with [name] and/or Britta Mccreedy Kuchenbecker's self by Telephone  (Video is Surveyor, mining) and verified that I am speaking with the correct person using two identifiers.Discussed confidentiality: Yes   I discussed the limitations of telemedicine and the availability of in person appointments.  Discussed there is a possibility of technology failure and discussed alternative modes of communication if that failure occurs.  I discussed that engaging in this telemedicine visit, they consent to the provision of behavioral healthcare and the services will be billed under their insurance.  Patient and/or legal guardian expressed understanding and consented to Telemedicine visit: Yes   Presenting Concerns: Patient and/or family reports the following symptoms/concerns: Inc'd sense of wellness since her infusion for COVID-19. Stepped outside today for first time in a week. Pt needs Husb to listen to her needs.  Duration of problem: several wks; Severity of problem: mild  Patient and/or Family's Strengths/Protective Factors: Social and Emotional competence, Concrete supports in place (healthy food, safe environments, etc.) and Sense of purpose  Goals Addressed: Patient will: 1.  Reduce symptoms of: anxiety and depression  2.  Increase knowledge and/or ability of: coping skills and stress reduction  3.  Demonstrate ability to: Increase healthy adjustment to current life circumstances and mood regulation  Progress towards  Goals: Ongoing  Interventions: Interventions utilized:  Solution-Focused Strategies and Supportive Counseling Standardized Assessments completed: Not Needed  Patient and/or Family Response: Pt receptive to talking about issues.  Assessment: Patient currently experiencing improved sense of wellness since Dx of COVID-19.   Patient may benefit from relational cslg to address marital issues w/dependent Husb.  Plan: 1. Follow up with behavioral health clinician on : 2 wks 2. Behavioral recommendations: Journal your concerns btwn sessions. 3. Referral(s): Integrated [facility] (In Clinic)  I discussed the assessment and treatment plan with the patient and/or parent/guardian. They were provided an opportunity to ask questions and all were answered. They agreed with the plan and demonstrated an understanding of the instructions.   They were advised to call back or seek an in-person evaluation if the symptoms worsen or if the condition fails to improve as anticipated.  Deneise Lever, LMFT

## 2020-12-23 ENCOUNTER — Other Ambulatory Visit: Payer: Self-pay | Admitting: Student

## 2020-12-23 ENCOUNTER — Encounter: Payer: Self-pay | Admitting: Student

## 2020-12-23 ENCOUNTER — Ambulatory Visit (INDEPENDENT_AMBULATORY_CARE_PROVIDER_SITE_OTHER): Payer: Medicare (Managed Care) | Admitting: Student

## 2020-12-23 ENCOUNTER — Other Ambulatory Visit: Payer: Self-pay

## 2020-12-23 VITALS — BP 135/77 | HR 71 | Temp 97.7°F | Ht 65.0 in | Wt 149.6 lb

## 2020-12-23 DIAGNOSIS — E1165 Type 2 diabetes mellitus with hyperglycemia: Secondary | ICD-10-CM | POA: Diagnosis not present

## 2020-12-23 DIAGNOSIS — Z794 Long term (current) use of insulin: Secondary | ICD-10-CM | POA: Diagnosis not present

## 2020-12-23 DIAGNOSIS — M1611 Unilateral primary osteoarthritis, right hip: Secondary | ICD-10-CM

## 2020-12-23 DIAGNOSIS — F320 Major depressive disorder, single episode, mild: Secondary | ICD-10-CM | POA: Diagnosis not present

## 2020-12-23 DIAGNOSIS — Z0000 Encounter for general adult medical examination without abnormal findings: Secondary | ICD-10-CM

## 2020-12-23 DIAGNOSIS — E1169 Type 2 diabetes mellitus with other specified complication: Secondary | ICD-10-CM | POA: Diagnosis not present

## 2020-12-23 DIAGNOSIS — D509 Iron deficiency anemia, unspecified: Secondary | ICD-10-CM

## 2020-12-23 DIAGNOSIS — IMO0002 Reserved for concepts with insufficient information to code with codable children: Secondary | ICD-10-CM

## 2020-12-23 DIAGNOSIS — E785 Hyperlipidemia, unspecified: Secondary | ICD-10-CM

## 2020-12-23 DIAGNOSIS — I10 Essential (primary) hypertension: Secondary | ICD-10-CM

## 2020-12-23 DIAGNOSIS — Z23 Encounter for immunization: Secondary | ICD-10-CM

## 2020-12-23 LAB — POCT GLYCOSYLATED HEMOGLOBIN (HGB A1C): Hemoglobin A1C: 9.4 % — AB (ref 4.0–5.6)

## 2020-12-23 LAB — GLUCOSE, CAPILLARY: Glucose-Capillary: 104 mg/dL — ABNORMAL HIGH (ref 70–99)

## 2020-12-23 MED ORDER — LANTUS SOLOSTAR 100 UNIT/ML ~~LOC~~ SOPN: 28.0000 [IU] | PEN_INJECTOR | Freq: Every day | SUBCUTANEOUS | 1 refills | Status: DC

## 2020-12-23 MED ORDER — LOSARTAN POTASSIUM 50 MG PO TABS: 50.0000 mg | ORAL_TABLET | Freq: Every day | ORAL | 1 refills | Status: DC

## 2020-12-23 MED ORDER — AMLODIPINE BESYLATE 10 MG PO TABS: 10.0000 mg | ORAL_TABLET | Freq: Every day | ORAL | 3 refills | Status: DC

## 2020-12-23 NOTE — Assessment & Plan Note (Signed)
Patient still on iron supplementation. Last hemoglobin was 9.7 one years ago.  Patient denies any signs of bleeding. --Continue ferrous sulfate 325 mg daily --Follow-up CBC --Check iron levels at next follow-up visit

## 2020-12-23 NOTE — Assessment & Plan Note (Signed)
Patient received a pneumococcal conjugate vaccine 13-valent today.

## 2020-12-23 NOTE — Assessment & Plan Note (Addendum)
Patient was referred to integrated behavioral health and had her first encounter 2 weeks ago. Patient states her first encounter with virtual and was helpful so she is looking forward to in person encounter. States she continues to have some stress from family but is working on coping mechanisms and trying to adjust her expectations in her family. PHQ 9 score today of 11 down from 14 three months ago. --Continue counseling with integrated behavioral health

## 2020-12-23 NOTE — Patient Instructions (Addendum)
Thank you, Ms.Omie Salois for allowing [place] to provide your care today. Today we discussed your diabetes, blood pressure, cholesterol, and depression.  We want you to decrease your Lantus from 32 units to 28 units at bedtime.  Continue to check your blood sugar 2-3 times a day.  We have also increase the amlodipine to 10 mg daily.  Follow-up with [place] in about 4 weeks to see the effects of these changes.  I have ordered the following labs for you:  Lab Orders     Lipid Profile     BMP8+Anion Gap     CBC no Diff     POC Hbg A1C   I will call if any are abnormal. All of your labs can be accessed through "My Chart".   I have ordered the following medication/changed the following medications:  1. Decrease Lantus to 28 units at bedtime 2. Increase Amlodipine to 10 mg daily  Please follow-up in 4 weeks  Should you have any questions or concerns please call the internal medicine clinic at [phone].    Linwood Dibbles, MD, MPH [facility]   My Chart Access: [email or web address]   If you have not already done so, please get your COVID 19 vaccine  To schedule an appointment for a COVID vaccine choice any of the following: Go to [email or web address]   Go to [email or web address]                  Call [phone]                                     Call [phone] and select Option 2

## 2020-12-23 NOTE — Progress Notes (Signed)
   CC: Follow-up on chronic medical problems  HPI:  [name] is a 65 y.o. woman with PMH as below who presented for follow-up on her chronic medical problems. Please see problem based charting for evaluation, assessment and plan.  Past Medical History:  Diagnosis Date  . Allergy   . Anxiety    occasional  . Arthritis   . Atherosclerotic peripheral vascular disease with intermittent claudication ([place])    s/p fem-fem bypass 2011. ABI 07/2012 0.5 R 0.65 L  . Diabetes mellitus without complication ([place])   . GERD (gastroesophageal reflux disease)   . Hyperlipidemia   . Hypertension   . IDDM (insulin dependent diabetes mellitus) 07/21/2007   July 2011: Femoral-femoral bypass  ABI 08/11/2012 notable for 0.4-0.59 on right ankle suggestive of severe arterial occlusive disease at rest and 0.60-0.7 on left ankle suggestive of moderate arterial occlusive disease at rest.   . Personal history of colonic polyps - adenomas 03/07/2014  . Tobacco abuse     Review of Systems:  Constitutional: Negative for fever or fatigue Eyes: Negative for visual changes Respiratory: Negative for shortness of breath Cardiac: Negative for chest pain MSK: Negative for back pain Abdomen: Negative for abdominal pain, constipation or diarrhea Neuro: Negative for headache or weakness Psych: Positive for depression  Physical Exam: General: Pleasant elderly woman. No acute distress. Cardiac: RRR. No murmurs, rubs or gallops.  No LE edema Respiratory: Lungs CTAB. No wheezing or crackles. Abdominal: Soft, symmetric and non tender. Normal BS. Skin: Warm, dry and intact without rashes or lesions Extremities: Atraumatic. Full ROM. Palpable dorsalis pedis pulses Neuro: A&O x 3. Moves all extremities. No focal deficits.  Psych: Appropriate mood and affect.  Vitals:   12/23/20 1015  BP: 135/77  Pulse: 71  Temp: 97.7 F (36.5 C)  TempSrc: Oral  SpO2: 100%  Weight: 149 lb 9.6 oz (67.9 kg)  Height: 5\' 5"   (1.651 m)    Assessment & Plan:   See Encounters Tab for problem based charting.  Patient discussed with Dr. Amie Critchley, MD, MPH

## 2020-12-23 NOTE — Assessment & Plan Note (Addendum)
Patient's A1c of 9.4% remains unchanged from 3 months ago. States she contracted COVID last month and stayed at home most of the time. She reports being inconsistent with her dietary changes. The readings from her meter shows a blood sugar average of 103, with a range of 66-230. Patient had a few readings in the 60s and states she feels jittery whenever her blood sugar is low. She denies any lightheadedness, blurry vision, headaches, polyuria or polydipsia. Patient does endorse occasional fatigue. In the setting of multiple blood sugars in the 60s, plan to reduce Lantus to 28 units at bedtime. Patient is instructed to bring meter to next clinic appointment in 4 weeks so her insulin can be adjusted as needed. --Decrease Lantus from 32 units to 28 units daily at bedtime --Continue Janumet 50-1000 mg daily --Continue dietary modifications. --Follow-up in 4 weeks for blood sugar check.

## 2020-12-23 NOTE — Assessment & Plan Note (Addendum)
Patient's BP today is 135/77.  BP goal is systolic BP less than 008.  Patient denies any blurry vision, headache or dizziness. Plan to increase amlodipine from 5 mg to 10 mg today.  --Increase amlodipine 5 mg to 10 mg daily --Continue on losartan 50 mg daily --Follow-up BMP --BP check at next office visit

## 2020-12-23 NOTE — Assessment & Plan Note (Signed)
Patient with no lipid panel on file in the last few years.  Currently on atorvastatin 40 mg daily.  Plan to check lipid panel today. --Continue atorvastatin 40 mg daily --Follow-up lipid panel

## 2020-12-23 NOTE — Assessment & Plan Note (Signed)
Patient continues to have pain down her right leg. States there exercises at home has been helping with the pain. Patient is scheduled to see orthopedics this week. --Has an appointment to see orthopedic this week. --Continue home hip and stretching exercises

## 2020-12-24 LAB — BMP8+ANION GAP
Anion Gap: 14 mmol/L (ref 10.0–18.0)
BUN/Creatinine Ratio: 15 (ref 12–28)
BUN: 11 mg/dL (ref 8–27)
CO2: 26 mmol/L (ref 20–29)
Calcium: 9.5 mg/dL (ref 8.7–10.3)
Chloride: 102 mmol/L (ref 96–106)
Creatinine, Ser: 0.71 mg/dL (ref 0.57–1.00)
GFR calc Af Amer: 103 mL/min/{1.73_m2} (ref >59)
GFR calc non Af Amer: 89 mL/min/{1.73_m2} (ref >59)
Glucose: 96 mg/dL (ref 65–99)
Potassium: 3.7 mmol/L (ref 3.5–5.2)
Sodium: 142 mmol/L (ref 134–144)

## 2020-12-24 LAB — CBC
Hematocrit: 40.5 % (ref 34.0–46.6)
Hemoglobin: 12.7 g/dL (ref 11.1–15.9)
MCH: 24.9 pg — ABNORMAL LOW (ref 26.6–33.0)
MCHC: 31.4 g/dL — ABNORMAL LOW (ref 31.5–35.7)
MCV: 79 fL (ref 79–97)
Platelets: 334 10*3/uL (ref 150–450)
RBC: 5.1 x10E6/uL (ref 3.77–5.28)
RDW: 13.7 % (ref 11.7–15.4)
WBC: 6.4 10*3/uL (ref 3.4–10.8)

## 2020-12-24 LAB — LIPID PANEL
Chol/HDL Ratio: 2.2 ratio (ref 0.0–4.4)
Cholesterol, Total: 101 mg/dL (ref 100–199)
HDL: 45 mg/dL (ref >39)
LDL Chol Calc (NIH): 43 mg/dL (ref 0–99)
Triglycerides: 56 mg/dL (ref 0–149)
VLDL Cholesterol Cal: 13 mg/dL (ref 5–40)

## 2020-12-24 NOTE — Progress Notes (Signed)
Internal Medicine Clinic Attending  Case discussed with Dr. A[place] at the time of the visit.  We reviewed the resident's history and exam and pertinent patient test results.  I agree with the assessment, diagnosis, and plan of care documented in the resident's note.  Ronie Fleeger, M.D., Ph.D.  

## 2020-12-25 ENCOUNTER — Telehealth: Payer: Self-pay | Admitting: Behavioral Health

## 2020-12-25 ENCOUNTER — Ambulatory Visit: Payer: Medicare Other | Admitting: Behavioral Health

## 2020-12-25 NOTE — Telephone Encounter (Signed)
Duplicate calls to Pt's home & cell this morning alerting her to telehealth visit this afternoon instead of [id number]. At 7:01ID made duplicate calls to ea phone again for appt.  Dr. Kerin Salen, PhD, LMFT

## 2020-12-27 ENCOUNTER — Other Ambulatory Visit (HOSPITAL_COMMUNITY): Payer: Self-pay | Admitting: Surgical

## 2021-01-07 ENCOUNTER — Other Ambulatory Visit: Payer: Self-pay

## 2021-01-07 ENCOUNTER — Telehealth: Payer: Self-pay | Admitting: Behavioral Health

## 2021-01-07 ENCOUNTER — Ambulatory Visit: Payer: Medicare (Managed Care) | Admitting: Behavioral Health

## 2021-01-07 NOTE — Telephone Encounter (Signed)
Lft 2 msg for Pt about today's [place] visit.  Dr. Theodis Shove

## 2021-01-20 ENCOUNTER — Ambulatory Visit (INDEPENDENT_AMBULATORY_CARE_PROVIDER_SITE_OTHER): Payer: Medicare (Managed Care) | Admitting: Student

## 2021-01-20 ENCOUNTER — Other Ambulatory Visit: Payer: Self-pay | Admitting: Student

## 2021-01-20 ENCOUNTER — Other Ambulatory Visit: Payer: Self-pay

## 2021-01-20 ENCOUNTER — Encounter: Payer: Self-pay | Admitting: Student

## 2021-01-20 VITALS — BP 131/84 | HR 83 | Temp 98.2°F | Ht 65.0 in | Wt 142.8 lb

## 2021-01-20 DIAGNOSIS — D509 Iron deficiency anemia, unspecified: Secondary | ICD-10-CM

## 2021-01-20 DIAGNOSIS — IMO0002 Reserved for concepts with insufficient information to code with codable children: Secondary | ICD-10-CM

## 2021-01-20 DIAGNOSIS — E1165 Type 2 diabetes mellitus with hyperglycemia: Secondary | ICD-10-CM | POA: Diagnosis not present

## 2021-01-20 DIAGNOSIS — Z794 Long term (current) use of insulin: Secondary | ICD-10-CM | POA: Diagnosis not present

## 2021-01-20 DIAGNOSIS — I10 Essential (primary) hypertension: Secondary | ICD-10-CM

## 2021-01-20 MED ORDER — SYNJARDY 5-1000 MG PO TABS: 1.0000 | ORAL_TABLET | Freq: Two times a day (BID) | ORAL | 3 refills | Status: DC

## 2021-01-20 NOTE — Assessment & Plan Note (Signed)
Today's Vitals   01/20/21 1021  BP: 131/84  Pulse: 83  Temp: 98.2 F (36.8 C)  TempSrc: Oral  SpO2: 100%  Weight: 142 lb 12.8 oz (64.8 kg)  Height: 5\' 5"  (1.651 m)   Body mass index is 23.76 kg/m.  Patient with history of HTN, well controlled with norvasc 10mg  daily and lisinopril 50mg  daily.  -continue current meds

## 2021-01-20 NOTE — Assessment & Plan Note (Addendum)
Patient with history of T2DM, on lantus 28 units and Janumet 50-1000mg  BID. HbA1c 9.4% one month ago. Readings from her meter showing blood sugar average of 146, with a range of 60-269. Denies having any symptoms when her readings are on the lower end (60, 65). Does report that she does not always eat a good meal even when she takes her medications. Denies lightheadedness, polyuria, polydipsia. Discussed that taking lantus at the usual dose with having regular meals can lead to some lows. States that she will do better with eating regular meals. Also discussed several highs (221, 225, 223) which all occurred on Sundays. She reports that Saturday night is when she usually drinks sweet alcoholic beverages, and we agreed to cut down on those drinks. Also discussed benefit of switching from Janumet to [place] 5-1000mg  BID, to which she agreed. Also placed referral to [facility] for further assistance with nutrition.  Plan: -lantus 28 units qhs -stop Janumet -start Synjardy 5-1000mg  BID -referral to [facility] -f/u in 1 month to reassess meter readings

## 2021-01-20 NOTE — Progress Notes (Signed)
   CC:   HPI:  [name] is a 65 y.o. F with history listed below presenting to the [facility] for f/u of her diabetes. Please see individualized problem based charting for full HPI.  Past Medical History:  Diagnosis Date  . Allergy   . Anxiety    occasional  . Arthritis   . Atherosclerotic peripheral vascular disease with intermittent claudication ([place])    s/p fem-fem bypass 2011. ABI 07/2012 0.5 R 0.65 L  . Diabetes mellitus without complication ([place])   . GERD (gastroesophageal reflux disease)   . Hyperlipidemia   . Hypertension   . IDDM (insulin dependent diabetes mellitus) 07/21/2007   July 2011: Femoral-femoral bypass  ABI 08/11/2012 notable for 0.4-0.59 on right ankle suggestive of severe arterial occlusive disease at rest and 0.60-0.7 on left ankle suggestive of moderate arterial occlusive disease at rest.   . Personal history of colonic polyps - adenomas 03/07/2014  . Tobacco abuse     Review of Systems:  Negative aside from that listed in individualized problem based charting.  Physical Exam:  Vitals:   01/20/21 1021  BP: 131/84  Pulse: 83  Temp: 98.2 F (36.8 C)  TempSrc: Oral  SpO2: 100%  Weight: 142 lb 12.8 oz (64.8 kg)  Height: 5\' 5"  (1.651 m)   Physical Exam Constitutional:      Appearance: Normal appearance. She is not ill-appearing.  HENT:     Mouth/Throat:     Mouth: Mucous membranes are moist.     Pharynx: Oropharynx is clear. No oropharyngeal exudate.  Eyes:     Extraocular Movements: Extraocular movements intact.     Conjunctiva/sclera: Conjunctivae normal.     Pupils: Pupils are equal, round, and reactive to light.  Cardiovascular:     Rate and Rhythm: Normal rate and regular rhythm.     Pulses: Normal pulses.     Heart sounds: Normal heart sounds. No murmur heard. No friction rub. No gallop.   Pulmonary:     Effort: Pulmonary effort is normal.     Breath sounds: Normal breath sounds. No wheezing, rhonchi or rales.  Abdominal:      General: Bowel sounds are normal. There is no distension.     Palpations: Abdomen is soft.     Tenderness: There is no abdominal tenderness.  Musculoskeletal:        General: No swelling. Normal range of motion.     Cervical back: Normal range of motion.  Skin:    General: Skin is warm and dry.  Neurological:     General: No focal deficit present.     Mental Status: She is alert and oriented to person, place, and time.  Psychiatric:        Mood and Affect: Mood normal.        Behavior: Behavior normal.      Assessment & Plan:   See Encounters Tab for problem based charting.  Patient discussed with Angie Cain

## 2021-01-20 NOTE — Patient Instructions (Signed)
[name],  It was a pleasure seeing you in the clinic today.   We discussed the following today:  1. Please see Dr. Theodis Shove and [facility] Penny as scheduled.  2. We got some labs to check your iron levels. I will call you if any are abnormal.  3. Please drink more water (2-3 bottles per day).  4. I have placed an order for Synjardy (for diabetes). Please STOP taking Janumet from now on.  5. Please come back in 1 month for follow up.  Please call our clinic at [phone] if you have any questions or concerns. The best time to call is Monday-Friday from 9am-4pm, but there is someone available 24/7 at the same number. If you need medication refills, please notify your pharmacy one week in advance and they will send [place] a request.   Thank you for letting us take part in your care. We look forward to seeing you next time!

## 2021-01-20 NOTE — Assessment & Plan Note (Signed)
Patient with history of IDA, was previously on ferrous sulfate but she reports she stopped taking them because of constipation. Will obtain iron panel and ferritin to reassess iron status and need for further supplementation.  -f/u iron panel and ferritin

## 2021-01-21 LAB — IRON AND TIBC
Iron Saturation: 32 % (ref 15–55)
Iron: 101 ug/dL (ref 27–139)
Total Iron Binding Capacity: 316 ug/dL (ref 250–450)
UIBC: 215 ug/dL (ref 118–369)

## 2021-01-21 LAB — FERRITIN: Ferritin: 42 ng/mL (ref 15–150)

## 2021-01-22 NOTE — Progress Notes (Signed)
Internal Medicine Clinic Attending  Case discussed with Dr. Jinwala  At the time of the visit.  We reviewed the resident's history and exam and pertinent patient test results.  I agree with the assessment, diagnosis, and plan of care documented in the resident's note.  

## 2021-01-24 ENCOUNTER — Other Ambulatory Visit (HOSPITAL_COMMUNITY): Payer: Self-pay | Admitting: Surgical

## 2021-02-04 ENCOUNTER — Institutional Professional Consult (permissible substitution): Payer: Medicare (Managed Care) | Admitting: Behavioral Health

## 2021-02-06 ENCOUNTER — Other Ambulatory Visit (HOSPITAL_COMMUNITY): Payer: Self-pay | Admitting: Physical Medicine and Rehabilitation

## 2021-02-07 ENCOUNTER — Other Ambulatory Visit: Payer: Self-pay

## 2021-02-07 ENCOUNTER — Ambulatory Visit: Payer: Medicare (Managed Care) | Admitting: Behavioral Health

## 2021-02-07 DIAGNOSIS — F330 Major depressive disorder, recurrent, mild: Secondary | ICD-10-CM

## 2021-02-07 NOTE — BH Specialist Note (Signed)
Integrated Behavioral Health Follow Up In-Person Visit  MRN: [id number] Name: Angie Cain  Number of [place] Clinician visits: 2/6 Session Start time: 9:15am  Session End time: 10:00am Total time: 45  minutes  Types of Service: Individual psychotherapy  Interpretor:No. Interpretor Name and Language: n/a   Subjective: Angie Cain is a 65 y.o. female accompanied by self Patient was referred by Dr. Virl Axe, MD for Adjustment issues in home family life, health status changes, & relational challenges. Patient reports the following symptoms/concerns: elevated concerns for relational difficulties w/family members & a friend. Duration of problem: several months; Severity of problem: mild  Objective: Mood: Euthymic and Affect: Appropriate Risk of harm to self or others: No plan to harm self or others  Life Context: Family and Social: Pt lives w/Husb [place] & 47yo Son. Pt does not get help around the home; her Husb & Son are critical & Pt is being her authentic self. Pt has good network of friends, but questions how ppl treat her at times. School/Work: Pt cares for [employer] keeps her home. Pt does not attend school or work outside the home currently. Self-Care: Pt is dressed impecibly today, wearing a dynamic outfit & sporting a hat. Pt knows how to advocate for herself, but does not appreciate how ppl respond to her own sense of confidence & esteem. Pt has positive set of boundaries & an authentic sense of selfhood. Life Changes: Pt is managing chronic health issues. Pt did not speak to health issues today-only relational concerns.  Patient and/or Family's Strengths/Protective Factors: Social connections, Social and Emotional competence, Concrete supports in place (healthy food, safe environments, etc.) and Sense of purpose, Pt has strong sense of spirituality.  Goals Addressed: Patient will: 1.  Reduce symptoms of: anxiety and stress  2.  Increase  knowledge and/or ability of: stress reduction  3.  Demonstrate ability to: Increase healthy adjustment to current life circumstances  Progress towards Goals: Ongoing  Interventions: Interventions utilized:  Supportive Counseling Standardized Assessments completed: Not Needed  Patient and/or Family Response: Pt receptive to in person visit today-outgoing & wants future visits to speak w/someone objective.  Patient Centered Plan: Patient is on the following Treatment Plan(s): Cont supportive Cslg w/active listener Assessment: Patient currently experiencing some distress over relational issues w/others. Pt had COVID-19 & is working past this health issue. Pt has grown weary of being around others & has decided not to travel on a Cruise in June w/her friend of 30 yrs. This friend has been acting different lately & she is questioning her loyalty.   Patient may benefit from Cont'd support in Cslg & narration of her character for ego strengthening.  Plan: 1. Follow up with behavioral health clinician on : 3 wks in person for one hour 2. Behavioral recommendations: Believe in yourself & your sound dec-mkg  3. Referral(s): [place] (In Clinic) 4. "From scale of 1-10, how likely are you to follow plan?": [place], LMFT

## 2021-02-14 ENCOUNTER — Encounter: Payer: Medicare (Managed Care) | Admitting: Dietician

## 2021-02-14 NOTE — Progress Notes (Incomplete)
  Medical Nutrition Therapy Via Telemedicine (Telephone or Video Assisted Platform):  Appt start time: *** end time:  ***. Total time: *** Visit # 1 Left voicemail for return call Debera Lat, RD 02/14/2021 9:18 AM.    This is a telephone encounter between@ and *** on 02/14/2021 for ***. The visit was conducted with the patient located at home and *** at [facility]. The patient's identity was confirmed using their DOB and current address. The patient has consented to being evaluated through a telephone encounter and understands the associated risks / benefits (allows the patient to remain at home, decreasing exposure to coronavirus). I personally spent *** minutes on medical nutrition therapy discussion.   The following statements were read to the patient and/or legal guardian that are established with the Nutritional Health Provider.   "The purpose of this phone visit is to provide behavioral health care while limiting exposure to the coronavirus (COVID19).  There is a possibility of technology failure and discussed alternative modes of communication if that failure occurs."   "By engaging in this telephone visit, you consent to the provision of healthcare.  Additionally, you authorize for your insurance to be billed for the services provided during this telephone visit."    Patient and/or legal guardian consented to telephone visit: ***  Assessment:  Primary concerns today: ***.   Preferred Learning Style: ***  Auditory  Visual  Hands on  No preference indicated   Learning Readiness: ***  Not ready  Contemplating  Ready  Change in progress  ANTHROPOMETRICS: weight***, height***, BMI*** WEIGHT HISTORY: Highest: *** Lowest*** SLEEP:***  MEDICATIONS: *** BLOOD SUGAR: DIETARY INTAKE: Usual eating pattern includes *** meals and *** snacks per day. Everyday foods include ***.  Avoided foods include ***.  Food Intolerances: *** Nausea: ***,  Vomiting: ***, Diarrhea: ***,  Constipation: ***, Any hair loss: *** Dining Out (times/week): *** 24-hr recall:  B ( AM): ***  Snk ( AM): ***  L ( PM): *** Snk ( PM): *** D ( PM): *** Snk ( PM): *** Beverages: ***  Usual physical activity: ***  Progress Towards Goal(s):  {Desc; Goals Progress:21388}.   Nutritional Diagnosis:  {CHL AMB NUTRITIONAL DIAGNOSIS:[phone]}    Intervention:  Nutrition ***. Action Goal:  Outcome goal:  Coordination of care:   Teaching Method Utilized: Visual, Auditory,Hands on Handouts given during visit include:*** Barriers to learning/adherence to lifestyle change: *** Demonstrated degree of understanding via:  Teach Back   Monitoring/Evaluation:  Dietary intake, exercise, ***, and body weight {follow up:15908}.     Primary concern: referred for diabetes and nutrition. Her primary concern  24-hr recall suggests intake of *** kcal:  (Up at  AM) B ( AM)-   Snk ( AM)-   L ( PM)-   Snk ( PM)-   D ( PM)-   Snk ( PM)-   Typical day? {yes OV:785885}   Who is doing the cooking and shopping?  Support .... has she attended a support group?

## 2021-02-18 ENCOUNTER — Ambulatory Visit: Payer: Medicare Other | Admitting: Behavioral Health

## 2021-02-18 ENCOUNTER — Other Ambulatory Visit: Payer: Self-pay | Admitting: Internal Medicine

## 2021-02-18 ENCOUNTER — Other Ambulatory Visit: Payer: Self-pay | Admitting: *Deleted

## 2021-02-18 DIAGNOSIS — F330 Major depressive disorder, recurrent, mild: Secondary | ICD-10-CM

## 2021-02-18 DIAGNOSIS — E1165 Type 2 diabetes mellitus with hyperglycemia: Secondary | ICD-10-CM

## 2021-02-18 DIAGNOSIS — IMO0002 Reserved for concepts with insufficient information to code with codable children: Secondary | ICD-10-CM

## 2021-02-18 MED ORDER — UNIFINE PENTIPS 31G X 5 MM MISC: 1 refills | Status: DC

## 2021-02-18 NOTE — BH Specialist Note (Signed)
Integrated Behavioral Health Follow Up In-Person Visit  MRN: [id number] Name: Alayne Estrella  Number of [place] Clinician visits: 3/6 Session Start time: 2:00pm  Session End time: 2:50pm Total time: 50  minutes  Types of Service: Individual psychotherapy  Interpretor:No. Interpretor Name and Language: n/a   Subjective: Leronda Lewers is a 65 y.o. female accompanied by self Patient was referred by Dr. Jodell Cipro, MD for relational issues. Patient reports the following symptoms/concerns: anx/dep due to concerns over marital relationship Duration of problem: years; Severity of problem: mild to moderate  Objective: Mood: Anxious and Depressed and Affect: Appropriate Risk of harm to self or others: No plan to harm self or others  Life Context: Family and Social: Pt lives w/her [employer] of 40+ yrs; she has good friend support & 4 children contact her often  School/Work: Pt does not attend school, but wants to find a job to inc her independence. Pt enjoys the caregiver role & anything to do w/Law Self-Care: Pt caring for self by attending psychotherapy & by advocating for herself; finding her voice Life Changes: Pt has realized her marriage may not change  Patient and/or Family's Strengths/Protective Factors: Social connections, Social and Emotional competence, Concrete supports in place (healthy food, safe environments, etc.) and Sense of purpose  Goals Addressed: Patient will: 1.  Reduce symptoms of: anxiety and depression  2.  Increase knowledge and/or ability of: coping skills and stress reduction  3.  Demonstrate ability to: Increase healthy adjustment to current life circumstances and begin to examine health of marital relationship  Progress towards Goals: Ongoing  Interventions: Interventions utilized:  Supportive Counseling and understanding of relalional issues & her personal needs Standardized Assessments completed: Not Needed  Patient and/or  Family Response: Pt is receptive to suggestions during time in psychotherapy & open to further sessions w/an objective listener  Patient Centered Plan: Patient is on the following Treatment Plan(s): Relational strengthening & pattern understanding of beh & how it can influence others Assessment: Patient currently experiencing anx/dep due to life dec-mkg & relationship.   Patient may benefit from cont'd opportunity to process through current life transition.  Plan: 1. Follow up with behavioral health clinician on : in 2 wks; f:f for one hour 2. Behavioral recommendations: Cont to seek your truth & your voice; trust & rely on self & this quality 3. Referral(s): [place] (In Clinic) 4. "From scale of 1-10, how likely are you to follow plan?": [place], LMFT

## 2021-02-18 NOTE — Addendum Note (Signed)
Addended by: Hulan Fray on: 02/18/2021 03:37 PM   Modules accepted: Orders

## 2021-03-06 ENCOUNTER — Other Ambulatory Visit (HOSPITAL_COMMUNITY): Payer: Self-pay | Admitting: Ophthalmology

## 2021-03-06 ENCOUNTER — Ambulatory Visit: Payer: Medicare Other | Admitting: Behavioral Health

## 2021-03-06 ENCOUNTER — Other Ambulatory Visit: Payer: Self-pay

## 2021-03-06 DIAGNOSIS — H0102B Squamous blepharitis left eye, upper and lower eyelids: Secondary | ICD-10-CM | POA: Diagnosis not present

## 2021-03-06 DIAGNOSIS — F332 Major depressive disorder, recurrent severe without psychotic features: Secondary | ICD-10-CM

## 2021-03-06 DIAGNOSIS — H401131 Primary open-angle glaucoma, bilateral, mild stage: Secondary | ICD-10-CM | POA: Diagnosis not present

## 2021-03-06 DIAGNOSIS — H1045 Other chronic allergic conjunctivitis: Secondary | ICD-10-CM | POA: Diagnosis not present

## 2021-03-06 DIAGNOSIS — H0102A Squamous blepharitis right eye, upper and lower eyelids: Secondary | ICD-10-CM | POA: Diagnosis not present

## 2021-03-06 NOTE — BH Specialist Note (Signed)
Integrated Behavioral Health Follow Up In-Person Visit  MRN: [id number] Name: Angie Cain  Number of [place] Clinician visits: 4/6 Session Start time: 1:00pm  Session End time: 1:50pm Total time: 50  minutes  Types of Service: Individual psychotherapy  Interpretor:No. Interpretor Name and Language: n/a   Subjective: Angie Cain is a 65 y.o. female accompanied by self Patient was referred by Dr. Jodell Cipro, MD for relational concerns. Patient reports the following symptoms/concerns: Anx over friend's rxn to her decline to go on a Cruise & this SIL's beh in their own friendship. Pt is hoping to get her own place. She has never lived alone & wishes to sep fr Husb Duration of problem: months; Severity of problem: moderate  Objective: Mood: Anxious and Affect: At times, Pt rxn to discussion can be incongruent Risk of harm to self or others: No plan to harm self or others  Life Context: Family and Social: Pt lives w/Husb of 40+ yrs. She & Husb recently attended [employer] in [facility]. It was a difficult trip; SIL is difficult to interact with easily. School/Work: Pt does not attend Sch & is not working at this time. Self-Care: Pt is well-dressed @ attentive to her appearance. Pt reports she has good days & bad days. Life Changes: Pt is trying to regulate the emot'l impact of others. Pt has definitive boundaries, but is unsure at times how to interpret the beh choices of others.  Patient and/or Family's Strengths/Protective Factors: Social connections and Concrete supports in place (healthy food, safe environments, etc.)  Goals Addressed: Patient will: 1.  Reduce symptoms of: anxiety  2.  Increase knowledge and/or ability of: coping skills and self-management skills  3.  Demonstrate ability to: Increase healthy adjustment to current life circumstances  Progress towards Goals: Ongoing  Interventions: Interventions utilized:  Supportive  Counseling Standardized Assessments completed: Not Needed  Patient and/or Family Response: Pt receptive to session today & requests future appt.  Patient Centered Plan: Patient is on the following Treatment Plan(s): Cont to process relational concerns in psychotherapy Assessment: Patient currently experiencing anxiety due to the impact others' choices have on her & the expectations she feels to maintain her boundaries.   Patient may benefit from eventual Cpl Th or support from Cslr if she transitions out of the marital home.  Plan: 1. Follow up with behavioral health clinician on : 2-3 wks f87f for 60 min 2. Behavioral recommendations: Cont to keep your positive boundaries. On bad emot'l days, remind yourself of the positive talk your brain needs to hear. 3. Referral(s): [place] (In Clinic) 4. "From scale of 1-10, how likely are you to follow plan?": [place], LMFT

## 2021-03-25 ENCOUNTER — Ambulatory Visit: Payer: Medicare Other | Admitting: Behavioral Health

## 2021-03-25 DIAGNOSIS — F331 Major depressive disorder, recurrent, moderate: Secondary | ICD-10-CM

## 2021-03-25 DIAGNOSIS — F419 Anxiety disorder, unspecified: Secondary | ICD-10-CM

## 2021-03-25 NOTE — BH Specialist Note (Signed)
Integrated Behavioral Health Follow Up In-Person Visit  MRN: [id number] Name: Angie Cain  Number of [facility] Clinician visits: 5/6 Session Start time: 11:00am  Session End time: 11:50am Total time: 50  minutes  Types of Service: Individual psychotherapy  Interpretor:No. Interpretor Name and Language: n/a   Subjective: Angie Cain is a 65 y.o. female accompanied by self Patient was referred by Dr. Jodell Cipro, MD for relational concerns in the marriage. Patient reports the following symptoms/concerns: elevated anx due to interactions w/others that are troubling to her. Pt presents w/worry & anxiety of things not w/i her control. Pt also is sensitive over-thinker promoting worry & stress over small things that are inconsequential typically. 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 Pt would like more independence from [employer]. Self-Care: Pt presents well-kempt Life Changes: Pt has become dis-satisfied w/some aspects of her marriage & how she is treated.  Patient and/or Family's Strengths/Protective Factors: Social connections, Social and Emotional competence, Concrete supports in place (healthy food, safe environments, etc.) and Sense of purpose  Goals Addressed: Patient will: 1.  Reduce symptoms of: anxiety, obsessions and stress, & some signs of mood instability 2.  Increase knowledge and/or ability of: self-management skills and emot'l self-regulations skills need dvlpment  3.  Demonstrate ability to: Increase healthy adjustment to current life circumstances  Progress towards Goals: Ongoing  Interventions: Interventions utilized:  Solution-Focused  Strategies and CBT Cognitive Behavioral Therapy Standardized Assessments completed: Not Needed  Patient and/or Family Response: Pt receptive today & requests addt'l appt  Patient Centered Plan: Patient is on the following Treatment Plan(s): Pt will journal btwn sessions to focus discussions Assessment: Patient currently experiencing elevated anxiety & worry due to irritability during interactions invlg others.   Patient may benefit from screening to r/o possible PDs.  Plan: 1. Follow up with behavioral health clinician on : 2-3 wks f:f for 60 min 2. Behavioral recommendations: Journal as suggested 3. Referral(s): [facility] (In Clinic) 4. "From scale of 1-10, how likely are you to follow plan?": [place], LMFT

## 2021-04-08 ENCOUNTER — Ambulatory Visit (INDEPENDENT_AMBULATORY_CARE_PROVIDER_SITE_OTHER): Payer: Medicare Other | Admitting: Student

## 2021-04-08 ENCOUNTER — Other Ambulatory Visit: Payer: Self-pay

## 2021-04-08 ENCOUNTER — Other Ambulatory Visit (HOSPITAL_COMMUNITY)
Admission: RE | Admit: 2021-04-08 | Discharge: 2021-04-08 | Disposition: A | Discharge status: Home / Self Care | Payer: Medicare Other | Source: Ambulatory Visit | Attending: Internal Medicine | Admitting: Internal Medicine

## 2021-04-08 ENCOUNTER — Encounter: Payer: Self-pay | Admitting: Student

## 2021-04-08 VITALS — BP 129/85 | HR 67 | Temp 98.1°F | Ht 65.0 in | Wt 135.8 lb

## 2021-04-08 DIAGNOSIS — F332 Major depressive disorder, recurrent severe without psychotic features: Secondary | ICD-10-CM

## 2021-04-08 DIAGNOSIS — N898 Other specified noninflammatory disorders of vagina: Secondary | ICD-10-CM | POA: Diagnosis not present

## 2021-04-08 DIAGNOSIS — E1165 Type 2 diabetes mellitus with hyperglycemia: Secondary | ICD-10-CM

## 2021-04-08 DIAGNOSIS — IMO0002 Reserved for concepts with insufficient information to code with codable children: Secondary | ICD-10-CM

## 2021-04-08 DIAGNOSIS — I10 Essential (primary) hypertension: Secondary | ICD-10-CM

## 2021-04-08 DIAGNOSIS — Z794 Long term (current) use of insulin: Secondary | ICD-10-CM

## 2021-04-08 LAB — POCT GLYCOSYLATED HEMOGLOBIN (HGB A1C): Hemoglobin A1C: 11 % — AB (ref 4.0–5.6)

## 2021-04-08 LAB — GLUCOSE, CAPILLARY: Glucose-Capillary: 225 mg/dL — ABNORMAL HIGH (ref 70–99)

## 2021-04-08 NOTE — Patient Instructions (Addendum)
Thank you, Ms.Oluwatoni Pirro for allowing [place] to provide your care today. Today we discussed your diabetes and vaginal itching.    I have ordered the following labs for you:   Lab Orders     Glucose, capillary   I will call if any are abnormal. All of your labs can be accessed through "My Chart".  I have place a referrals to  I have ordered the following tests: Cervicalvaginal swab to check trich, BV, GC/chlamydia   Please follow-up in 2 weeks  Should you have any questions or concerns please call the internal medicine clinic at [phone].    Linwood Dibbles, MD, MPH [facility]   My Chart Access: [email or web address]   If you have not already done so, please get your COVID 19 vaccine  To schedule an appointment for a COVID vaccine choice any of the following: Go to [email or web address]   Go to [email or web address]                  Call [phone]                                     Call [phone] and select Option 2

## 2021-04-08 NOTE — Progress Notes (Signed)
   CC: Diabetes follow up  HPI:  Ms.Jackeline Chap is a 65 y.o. female with PMH as below who presents to clinic for follow up on her diabetes and evaluation of vaginal itching. Please see problem based charting for evaluation, assessment and plan.  Past Medical History:  Diagnosis Date  . Allergy   . Anxiety    occasional  . Arthritis   . Atherosclerotic peripheral vascular disease with intermittent claudication ([place])    s/p fem-fem bypass 2011. ABI 07/2012 0.5 R 0.65 L  . Diabetes mellitus without complication ([place])   . GERD (gastroesophageal reflux disease)   . Hyperlipidemia   . Hypertension   . IDDM (insulin dependent diabetes mellitus) 07/21/2007   July 2011: Femoral-femoral bypass  ABI 08/11/2012 notable for 0.4-0.59 on right ankle suggestive of severe arterial occlusive disease at rest and 0.60-0.7 on left ankle suggestive of moderate arterial occlusive disease at rest.   . Personal history of colonic polyps - adenomas 03/07/2014  . Tobacco abuse     Review of Systems:  Constitutional: Negative for fever. Positive for fatigue.  Eyes: Negative for visual changes Respiratory: Negative for shortness of breath Cardiac: Negative for chest pain or palpitations Abdomen: Negative for abdominal pain, constipation or diarrhea GU: Positive for vaginal itching. Negative for dysuria and vaginal discharge Neuro: Negative for headache or weakness Psych: Positive for depression and anxiety  Physical Exam: General: Pleasant, well-appearing elderly woman. No acute distress. Cardiac: RRR. No murmurs, rubs or gallops. No LE edema Respiratory: Lungs CTAB. No wheezing or crackles. Abdominal: Soft, symmetric and non tender. Normal BS. GU: No irritation, lesions or erythema around the vulva. Mild vaginal discharge. Normal appearence of the cervix. Skin: Warm, dry and intact without rashes or lesions Extremities: Atraumatic. Full ROM. Pulse palpable. Neuro: A&O x 3. Moves all  extremities Psych: Appropriate mood and affect.  Vitals:   04/08/21 0852  BP: 129/85  Pulse: 67  Temp: 98.1 F (36.7 C)  TempSrc: Oral  SpO2: 100%  Weight: 135 lb 12.8 oz (61.6 kg)  Height: 5\' 5"  (1.651 m)     Assessment & Plan:   See Encounters Tab for problem based charting.  Patient discussed with Dr. Louann Liv, MD, MPH

## 2021-04-09 ENCOUNTER — Other Ambulatory Visit (HOSPITAL_COMMUNITY): Payer: Self-pay

## 2021-04-09 ENCOUNTER — Encounter: Payer: Self-pay | Admitting: Student

## 2021-04-09 DIAGNOSIS — N898 Other specified noninflammatory disorders of vagina: Secondary | ICD-10-CM | POA: Insufficient documentation

## 2021-04-09 LAB — CERVICOVAGINAL ANCILLARY ONLY
Bacterial Vaginitis (gardnerella): NEGATIVE
Candida Glabrata: NEGATIVE
Candida Vaginitis: NEGATIVE
Chlamydia: NEGATIVE
Comment: NEGATIVE
Comment: NEGATIVE
Comment: NEGATIVE
Comment: NEGATIVE
Comment: NEGATIVE
Comment: NORMAL
Neisseria Gonorrhea: NEGATIVE
Trichomonas: NEGATIVE

## 2021-04-09 LAB — MICROSCOPIC EXAMINATION
Casts: NONE SEEN /lpf
Epithelial Cells (non renal): >10 /hpf — AB (ref 0–10)

## 2021-04-09 LAB — URINALYSIS, COMPLETE
Bilirubin, UA: NEGATIVE
Leukocytes,UA: NEGATIVE
Nitrite, UA: NEGATIVE
RBC, UA: NEGATIVE
Specific Gravity, UA: 1.029 (ref 1.005–1.030)
Urobilinogen, Ur: 0.2 mg/dL (ref 0.2–1.0)
pH, UA: 6 (ref 5.0–7.5)

## 2021-04-09 MED ORDER — INSULIN GLARGINE 100 UNIT/ML SOLOSTAR PEN
32.0000 [IU] | PEN_INJECTOR | Freq: Every day | SUBCUTANEOUS | 1 refills | Status: DC
  Filled 2021-04-09: qty 15, 46d supply, fill #0
  Filled 2021-06-25: qty 15, 46d supply, fill #1

## 2021-04-09 NOTE — Assessment & Plan Note (Addendum)
Patient here for diabetes follow-up. Patient's meter readings showed that her fasting sugars are consistently between 180 and 260 with a average of 210, high of 328 and low of 91. Patient not consistently taking her insulin because she reports that she does not always eat in the evening. Patient also reports that her Iva Boop was making her feel nauseous so she went back to taking the [place]. She also reports vaginal itching that started a few weeks ago.  Patient however denies any lightheadedness, headaches, polyuria or polydipsia. Discussed the importance of consistently taking her insulin to avoid high sugars which can contribute to her not feeling well. A1c today elevated to 11 from 9.4 three months ago.  Update: Cervicovaginal test was negative for BV, candida vaginitis, GC/chlymydia and trichomonas. On follow up call, patient was not taking her Synjardy with meals. States he vaginal itching has resolved. Patient is agreeable to going back on Synjardy and she was instructed to take it with meals and call the office if she experiences more nausea or yeast infection.    Plan: -- Increase Lantus from 28 units to 32 units at bedtime. -- Encouraged to have consistent meals and continue checking blood sugars in the morning. --Continue Synjardy 04-999 mg twice daily, can increase to 12/04-999 mg if patient tolerate it.  --Patient advised to be completely adherent to her regimen and call the office before making any changes. Can consider the addition of glimepiride if A1C has not improved on current regimen.

## 2021-04-09 NOTE — Assessment & Plan Note (Addendum)
Patient with a history of uncontrolled diabetes presents with complaint of vaginal itching. Patient states she was started on Synjardy 3 months ago.  She did not feel well after a few weeks of being on this medicine so she stopped taking it and went back to her Janumet.  In the last few weeks she has noticed increased white plaques on her tongue and vaginal itching. There are white plaques has improved significantly after using Monistat but she continues to have some sour taste in her mouth. She denies any vaginal discharge or pain but reports that her vagina does not feel normal. On exam, there is mild vaginal discharge but no lesions or erythema around the vulva.  Patient was informed that she is at increased risk of fungal infections but her uncontrolled diabetes and that she would need to be more consistent with taking her insulin and decreasing her alcohol and sugar intake. --Vaginal swab negative for BV, Candida vaginitis, trichomonas and GC/chlamydia  Plan: --Symptoms have improved on follow up call. Patient advised to continue to monitor.

## 2021-04-09 NOTE — Assessment & Plan Note (Signed)
Vitals:   04/08/21 0852  BP: 129/85   Blood pressure well controlled on current regimen. -- Continue amlodipine 10 mg daily -- Continue Cozaar 50 mg daily

## 2021-04-10 ENCOUNTER — Encounter: Payer: Self-pay | Admitting: Student

## 2021-04-10 NOTE — Assessment & Plan Note (Signed)
She has been seeing Dr. Theodis Shove. States her sessions have been going well. She has been working on reducing stress in her life.  --Continue therapy with Dr. Theodis Shove --Can revisit starting her back on SSRI at next OV

## 2021-04-16 ENCOUNTER — Other Ambulatory Visit (HOSPITAL_COMMUNITY): Payer: Self-pay

## 2021-04-16 ENCOUNTER — Ambulatory Visit: Payer: Medicare Other | Admitting: Behavioral Health

## 2021-04-17 NOTE — Progress Notes (Signed)
Internal Medicine Clinic Attending  Case discussed with Dr. Coy Saunas  At the time of the visit.  We reviewed the resident's history and exam and pertinent patient test results.  I agree with the assessment, diagnosis, and plan of care documented in the resident's note. Both GLP 1 agonists and SGLT2 inhibitors are associated with increased candidal infections. Monitor.

## 2021-04-24 DIAGNOSIS — M545 Low back pain, unspecified: Secondary | ICD-10-CM | POA: Diagnosis not present

## 2021-04-30 ENCOUNTER — Other Ambulatory Visit (HOSPITAL_COMMUNITY): Payer: Self-pay

## 2021-04-30 DIAGNOSIS — M5412 Radiculopathy, cervical region: Secondary | ICD-10-CM | POA: Diagnosis not present

## 2021-04-30 DIAGNOSIS — M5416 Radiculopathy, lumbar region: Secondary | ICD-10-CM | POA: Diagnosis not present

## 2021-05-06 ENCOUNTER — Ambulatory Visit: Payer: Medicare Other | Admitting: Behavioral Health

## 2021-05-26 ENCOUNTER — Ambulatory Visit: Payer: Medicare Other | Admitting: Behavioral Health

## 2021-05-29 ENCOUNTER — Telehealth: Payer: Self-pay | Admitting: *Deleted

## 2021-05-29 NOTE — Telephone Encounter (Signed)
Called patient to follow-up.   Patient reports this morning she had a couple bites of oatmeal, toast, yogurt, and banana. She also reports trying to stay hydrated and drink fluids throughout the day.  This morning blood glucose was 133.   Patient currently taking 32 units of Lantus nightly.   Instructed patient that she may take 33 units of Lantus tomorrow if her blood glucose is >200 when she checks it in the morning. Patient verbalized understanding of plan.  Patient okay to continue synjardy due to amount of fluid she is consuming and able to hold down. Currently reporting more nausea than vomiting. Patient will call tomorrow AM if she begins experiencing worsening vomiting. At that time likely should hold Synjardy until it subsides.

## 2021-05-29 NOTE — Telephone Encounter (Signed)
I spoke briefly with patient who says she is feeling poorly and told her that I would ask our pharmacist to call her about  insulin adjustments.

## 2021-05-29 NOTE — Telephone Encounter (Signed)
Patient called in c/o poor appetite, N/V x 2 days. States she is able to keep liquids down. Drinks 3-4 bottles of water per day in addition to iced tea and cool aid. First available appt was given to her by [facility] Staff for 6/21 at 3:15. She is requesting a call back from [facility] regarding any insulin adjustments that should be made.

## 2021-05-30 NOTE — Telephone Encounter (Signed)
This is appropriate, thank you.  I hope Angie Cain feels better soon.

## 2021-06-03 ENCOUNTER — Other Ambulatory Visit (HOSPITAL_COMMUNITY): Payer: Self-pay

## 2021-06-03 ENCOUNTER — Other Ambulatory Visit: Payer: Self-pay

## 2021-06-03 ENCOUNTER — Encounter: Payer: Self-pay | Admitting: Student

## 2021-06-03 ENCOUNTER — Ambulatory Visit (INDEPENDENT_AMBULATORY_CARE_PROVIDER_SITE_OTHER): Payer: Medicare HMO | Admitting: Student

## 2021-06-03 VITALS — BP 130/70 | HR 81 | Temp 98.5°F | Ht 65.0 in | Wt 138.9 lb

## 2021-06-03 DIAGNOSIS — IMO0002 Reserved for concepts with insufficient information to code with codable children: Secondary | ICD-10-CM

## 2021-06-03 DIAGNOSIS — Z794 Long term (current) use of insulin: Secondary | ICD-10-CM

## 2021-06-03 DIAGNOSIS — I10 Essential (primary) hypertension: Secondary | ICD-10-CM

## 2021-06-03 DIAGNOSIS — E1165 Type 2 diabetes mellitus with hyperglycemia: Secondary | ICD-10-CM | POA: Diagnosis not present

## 2021-06-03 MED ORDER — XULTOPHY 100-3.6 UNIT-MG/ML ~~LOC~~ SOPN
16.0000 [IU] | PEN_INJECTOR | Freq: Every day | SUBCUTANEOUS | 0 refills | Status: DC
  Filled 2021-06-03: qty 9, 56d supply, fill #0

## 2021-06-03 MED ORDER — EMPAGLIFLOZIN 10 MG PO TABS
10.0000 mg | ORAL_TABLET | Freq: Every day | ORAL | 0 refills | Status: DC
  Filled 2021-06-03: qty 30, 30d supply, fill #0

## 2021-06-03 NOTE — Assessment & Plan Note (Signed)
Patient's blood pressure 130/70 today. -Continue amlodipine 10mg  daily -Continue losartan 50mg  daily

## 2021-06-03 NOTE — Patient Instructions (Addendum)
Angie Cain,  It was a pleasure meeting you today.  For your diabetes, we recommend the following changes:  STOP taking Synjardy STOP taking lantus  START taking Xultophy 16 units daily START taking Jardiance 10mg  daily  We will need to monitor your blood sugars closely and adjust these new medications based on your blood sugars. We are hopeful that making this change will reduce your nausea, diarrhea and abdominal discomfort that may be from metformin.  Sincerely, Dr. Paulla Dolly, MD

## 2021-06-03 NOTE — Assessment & Plan Note (Addendum)
A1c of 11.0. Since last office visit, patient continues to feel that Iva Boop is not working well with her. She states that whenever she takes the medication that it "wreaks havoc on my body." She has been endorsing nausea, diarrhea and rarely vomiting whenever she takes this medication. She has been having poor appetite because of these symptoms. She has been taking the medication very sporadically due to these symptoms which have resulted in worsening GI complaints when she does take the medication. She states that she may take the [place] once daily but rarely takes twice daily. These symptoms have impacted her confidence in treating her diabetes even with her lantus and she reports that she ultimately wants to stop all medications.   After prolonged discussion with patient, we would like to conduct a trial off of metformin containing medications and optimize her SGLT2i, insulin and start a GLP1 agonist. Ideally, this patient will eventually be restarted on metformin as an extended release, low dose formulation, however I feel that this prescription is causing more harm than benefit for this patient as evidenced by her A1c trajectory over the past year.  -Start Xultophy 16 units daily with plan to titrate up by 2 units weekly as tolerated, aim for FBS of <140 -Start empagliflozin 10mg  daily before breakfast -Encouraged to monitor blood sugars at home -Discontinue Synjardy -Discontinue lantus -Referral to clinical pharmacist

## 2021-06-03 NOTE — Progress Notes (Signed)
   CC: T2DM follow-up, nausea, diarrhea, vomiting, abdominal discomfort  HPI:  [name] is a 65 y.o. with poorly controlled diabetes who presents to clinic for evaluation of T2DM and medication adverse effects. Refer to problem list for charting of this encounter  Past Medical History:  Diagnosis Date   Allergy    Anxiety    occasional   Arthritis    Atherosclerotic peripheral vascular disease with intermittent claudication ([place])    s/p fem-fem bypass 2011. ABI 07/2012 0.5 R 0.65 L   Diabetes mellitus without complication ([facility])    GERD (gastroesophageal reflux disease)    Hyperlipidemia    Hypertension    IDDM (insulin dependent diabetes mellitus) 07/21/2007   July 2011: Femoral-femoral bypass  ABI 08/11/2012 notable for 0.4-0.59 on right ankle suggestive of severe arterial occlusive disease at rest and 0.60-0.7 on left ankle suggestive of moderate arterial occlusive disease at rest.    Iron deficiency anemia 05/08/2017   Personal history of colonic polyps - adenomas 03/07/2014   Tobacco abuse    Review of Systems:  Endorses nausea, poor appetite, diarrhea, episodes of vomiting. Denies abdominal pain, fevers, chills.  Physical Exam:  Vitals:   06/03/21 1520  BP: 130/70  Pulse: 81  Temp: 98.5 F (36.9 C)  TempSrc: Oral  SpO2: 100%  Weight: 138 lb 14.4 oz (63 kg)  Height: 5\' 5"  (1.651 m)   Physical Exam Constitutional:      General: She is not in acute distress.    Appearance: She is normal weight.  Cardiovascular:     Rate and Rhythm: Normal rate and regular rhythm.     Pulses: Normal pulses.     Heart sounds: Normal heart sounds.  Pulmonary:     Effort: Pulmonary effort is normal.     Breath sounds: Normal breath sounds.  Abdominal:     General: Abdomen is flat. Bowel sounds are normal. There is no distension.     Palpations: Abdomen is soft.     Tenderness: There is no abdominal tenderness.    Assessment & Plan:   See Encounters Tab for problem based  charting.  Patient discussed with Dr. Philipp Ovens

## 2021-06-04 ENCOUNTER — Other Ambulatory Visit (HOSPITAL_COMMUNITY): Payer: Self-pay

## 2021-06-04 MED FILL — Insulin Pen Needle 31 G X 5 MM (1/5" or 3/16"): 90 days supply | Qty: 100 | Fill #0 | Status: AC

## 2021-06-09 NOTE — Progress Notes (Signed)
Internal Medicine Clinic Attending  Case discussed with Dr. Johnson  At the time of the visit.  We reviewed the resident's history and exam and pertinent patient test results.  I agree with the assessment, diagnosis, and plan of care documented in the resident's note.  

## 2021-06-12 ENCOUNTER — Other Ambulatory Visit (HOSPITAL_COMMUNITY): Payer: Self-pay

## 2021-06-12 ENCOUNTER — Ambulatory Visit (INDEPENDENT_AMBULATORY_CARE_PROVIDER_SITE_OTHER): Payer: Medicare HMO | Admitting: Internal Medicine

## 2021-06-12 ENCOUNTER — Telehealth: Payer: Self-pay

## 2021-06-12 VITALS — BP 136/78 | HR 79 | Temp 98.1°F | Ht 65.0 in | Wt 139.4 lb

## 2021-06-12 DIAGNOSIS — Z1231 Encounter for screening mammogram for malignant neoplasm of breast: Secondary | ICD-10-CM

## 2021-06-12 DIAGNOSIS — Z0000 Encounter for general adult medical examination without abnormal findings: Secondary | ICD-10-CM

## 2021-06-12 DIAGNOSIS — M62838 Other muscle spasm: Secondary | ICD-10-CM

## 2021-06-12 MED ORDER — DICLOFENAC SODIUM 1 % EX GEL
2.0000 g | Freq: Four times a day (QID) | CUTANEOUS | 2 refills | Status: DC
  Filled 2021-06-12: qty 100, 13d supply, fill #0

## 2021-06-12 MED ORDER — CYCLOBENZAPRINE HCL 5 MG PO TABS
5.0000 mg | ORAL_TABLET | Freq: Three times a day (TID) | ORAL | 0 refills | Status: DC | PRN
  Filled 2021-06-12: qty 20, 7d supply, fill #0

## 2021-06-12 MED FILL — Timolol Maleate Ophth Soln 0.5%: OPHTHALMIC | 37 days supply | Qty: 5 | Fill #0 | Status: AC

## 2021-06-12 NOTE — Assessment & Plan Note (Signed)
Mammogram referral sent.

## 2021-06-12 NOTE — Telephone Encounter (Signed)
Returned call to patient. C/o 2 day hx pain 2-4 inches below right breast extending to right hip. States she feels a "gap" there. Was deep cleaning several days ago on her knees and reaching under things. Appt given today at 1445.

## 2021-06-12 NOTE — Telephone Encounter (Signed)
T is requesting a call back .. she stated that she is having a deep pain under her ( right ) breast .. she stated that if she cant get an appt she is going to the ED

## 2021-06-12 NOTE — Progress Notes (Signed)
   CC: Pain under right breast  HPI:  [name] is a 65 y.o. with a past medical history listed below presenting with pain under her right breast. For details of today's visit and the status of his chronic medical issues please refer to the assessment and plan.   Past Medical History:  Diagnosis Date   Allergy    Anxiety    occasional   Arthritis    Atherosclerotic peripheral vascular disease with intermittent claudication ([place])    s/p fem-fem bypass 2011. ABI 07/2012 0.5 R 0.65 L   Diabetes mellitus without complication (HCC)    GERD (gastroesophageal reflux disease)    Hyperlipidemia    Hypertension    IDDM (insulin dependent diabetes mellitus) 07/21/2007   July 2011: Femoral-femoral bypass  ABI 08/11/2012 notable for 0.4-0.59 on right ankle suggestive of severe arterial occlusive disease at rest and 0.60-0.7 on left ankle suggestive of moderate arterial occlusive disease at rest.    Iron deficiency anemia 05/08/2017   Personal history of colonic polyps - adenomas 03/07/2014   Tobacco abuse    Review of Systems:   Review of Systems  Constitutional:  Negative for chills, fever, malaise/fatigue and weight loss.  Respiratory:  Negative for cough and shortness of breath.   Cardiovascular:  Negative for chest pain and palpitations.  Gastrointestinal:  Positive for abdominal pain, constipation and nausea. Negative for diarrhea and vomiting.  Musculoskeletal:  Negative for falls and joint pain.  Neurological:  Negative for dizziness, weakness and headaches.    Physical Exam:  Vitals:   06/12/21 1453  BP: 136/78  Pulse: 79  Temp: 98.1 F (36.7 C)  TempSrc: Oral  SpO2: 100%  Weight: 139 lb 6.4 oz (63.2 kg)  Height: 5\' 5"  (1.651 m)    Physical Exam General: alert, appears stated age, in no acute distress HEENT: Normocephalic, atraumatic, EOM intact, conjunctiva normal CV: Regular rate and rhythm, no murmurs rubs or gallops Pulm: Clear to auscultation bilaterally,  normal work of breathing Abdomen: Soft, nondistended, bowel sounds present, no tenderness to palpation MSK: Significant tenderness along the right rib just below the right breast along the midaxillary line, no swelling or erythema Skin: Warm and dry Neuro: Alert and oriented x3   Assessment & Plan:   See Encounters Tab for problem based charting.  Patient discussed with Dr. Philipp Ovens

## 2021-06-12 NOTE — Patient Instructions (Signed)
I think your pain is related to muscle spasm.  I have sent in a prescription for a muscle relaxer called Flexeril as well as Voltaren gel.  Take the muscle relaxer for severe pain only.  Take 5 mg up to 3 times daily as needed.  I would also recommend trying to wear a sports bra or something more comfortable to see if this helps alleviate your pain.

## 2021-06-12 NOTE — Assessment & Plan Note (Signed)
Patient presents with severe tenderness and aching pain on her right side just below her right breast.  Pain is along the right rib below her right breast along the midaxillary line.  She is significantly tender to palpation.  States that the pain has been ongoing for several months however recently significantly worse.  She does states she does not remember pulling a muscle or any injuries or trauma.  She states any motion or movement makes the pain worse especially reaching or laying on it.  She is able to ambulate okay.  States that she cannot sleep on that side.  She is only tried taking Aleve which alleviated some of her pain for very little time.  She denies any associated symptoms of other joint pain, nausea, vomiting, fevers, breast pain or abdominal pain.  Assessment/plan: Findings consistent with a muscle spasm of either an intercostal muscle versus serratus anterior.  Recommended conservative management with Voltaren gel and a short course of muscle relaxer.  Patient can also alternate heating pads and ice  -Voltaren gel as needed -Flexeril 5 mg up to 3 times daily as needed

## 2021-06-20 ENCOUNTER — Encounter: Payer: Medicare HMO | Admitting: Pharmacist

## 2021-06-23 ENCOUNTER — Ambulatory Visit (INDEPENDENT_AMBULATORY_CARE_PROVIDER_SITE_OTHER): Payer: Medicare HMO | Admitting: Dietician

## 2021-06-23 ENCOUNTER — Ambulatory Visit: Payer: Medicare HMO | Admitting: Behavioral Health

## 2021-06-23 ENCOUNTER — Ambulatory Visit (INDEPENDENT_AMBULATORY_CARE_PROVIDER_SITE_OTHER): Payer: Medicare HMO | Admitting: Pharmacist

## 2021-06-23 DIAGNOSIS — F419 Anxiety disorder, unspecified: Secondary | ICD-10-CM

## 2021-06-23 DIAGNOSIS — E1165 Type 2 diabetes mellitus with hyperglycemia: Secondary | ICD-10-CM

## 2021-06-23 DIAGNOSIS — Z630 Problems in relationship with spouse or partner: Secondary | ICD-10-CM

## 2021-06-23 DIAGNOSIS — Z794 Long term (current) use of insulin: Secondary | ICD-10-CM

## 2021-06-23 DIAGNOSIS — IMO0002 Reserved for concepts with insufficient information to code with codable children: Secondary | ICD-10-CM

## 2021-06-23 DIAGNOSIS — F331 Major depressive disorder, recurrent, moderate: Secondary | ICD-10-CM

## 2021-06-23 NOTE — Patient Instructions (Signed)
As we age it is important to eat enough protein and fiber.  Aim for 20-25 grams of protein and 5 grams of fiber per meal.   Be sure to drink enough low sugar fluids with you  Protein Content in Foods Protein is a necessary nutrient in any diet. It helps build and repair muscles, bones, and skin. Depending on your overall health, you may need more or less protein in your diet. You are encouraged to eat a variety of protein foods to ensure that you get all the essential nutrients that are found in different protein foods. Talk with your health care provider or dietitian about how muchprotein you need each day and which sources of protein are best for you. Protein is especially important for: Repairing and making cells and tissues. Fighting infection. Providing energy. Growth and development. See the following list for the protein content of some common foods. What are tips for getting more protein in your diet? Try to replace processed carbohydrates with high-quality protein. Snack on nuts and seeds instead of chips. Replace baked desserts with [place] yogurt. Eat protein foods from both plant and animal sources. Replace red meat with seafood. Add beans and peas to salads, soups, and side dishes. Include a protein food with each meal and snack. Reading food labels You can find the amount of protein in a food item by looking at the nutritionfacts label. Use the total grams listed to help you reach your daily goal. What foods are high in protein?  High-protein foods contain 4 grams (g) or more of protein per serving. Theyinclude: Grains Quinoa (cooked) -- 1 cup (185 g) has 8 g of protein. Whole wheat pasta (cooked) -- 1 cup (140 g) has 6 g of protein. Meat Beef, ground sirloin (cooked) -- 3 oz (85 g) has 24 g of protein. Chicken breast, boneless and skinless (cooked) -- 3 oz (85 g) has 25 g of protein. Egg -- 1 egg has 6 g of protein. Fish, filet (cooked) -- 1 oz (28 g) has 6-7 g of  protein. Lamb (cooked) -- 3 oz (85 g) has 24 g of protein. Pork tenderloin (cooked) -- 3 oz (85 g) has 23 g of protein. Tuna (canned in water) -- 3 oz (85 g) has 20 g of protein. Dairy Cottage cheese --  cup (114 g) has 13.4 g of protein. Milk -- 1 cup (237 mL) has 8 g of protein. Cheese (hard) -- 1 oz (28 g) has 7 g of protein. Yogurt, regular -- 6 oz (170 g) has 8 g of protein. Greek yogurt -- 6 oz (200 g) has 18 g protein. Plant protein Garbanzo beans (canned or cooked) --  cup (130 g) has 6-7 g of protein. Kidney beans (canned or cooked) --  cup (130 g) has 6-7 g of protein. Nuts (peanuts, pistachios, almonds) -- 1 oz (28 g) has 6 g of protein. Peanut butter -- 1 oz (32 g) has 7-8 g of protein. Pumpkin seeds -- 1 oz (28 g) has 8.5 g of protein. Soybeans (roasted) -- 1 oz (28 g) has 8 g of protein. Soybeans (cooked) --  cup (90 g) has 11 g of protein. Soy milk -- 1 cup (250 mL) has 5-10 g of protein. Soy or vegetable patty -- 1 patty has 11 g of protein. Sunflower seeds -- 1 oz (28 g) has 5.5 g of protein. Buckwheat -- 1 oz (33 g) has 4.3 g of protein. Tofu (firm) --  cup (124 g) has  20 g of protein. Tempeh --  cup (83 g) has 16 g of protein. The items listed above may not be a complete list of foods high in protein. Actual amounts of protein may differ depending on processing. Contact a dietitian for more information. What foods are low in protein?  Low-protein foods contain 3 grams (g) or less of protein per serving. Theyinclude: Fruits Fruit or vegetable juice --  cup (125 mL) has 1 g of protein. Vegetables Beets (raw or cooked) --  cup (68 g) has 1.5 g of protein. Broccoli (raw or cooked) --  cup (44 g) has 2 g of protein. Collard greens (raw or cooked) --  cup (42 g) has 2 g of protein. Green beans (raw or cooked) --  cup (83 g) has 1 g of protein. Green peas (canned) --  cup (80 g) has 3.5 g of protein. Potato (baked with skin) -- 1 medium potato (173 g) has 3  g of protein. Spinach (cooked) --  cup (90 g) has 3 g of protein. Squash (cooked) --  cup (90 g) has 1.5 g of protein. Avocado -- 1 cup (146 g) has 2.7 g of protein. Grains Bran cereal --  cup (30 g) has 2-3 g of protein. Bread -- 1 slice has 2.5 g of protein. Corn (fresh or cooked) --  cup (77 g) has 2 g of protein. Flour tortilla -- One 6-inch (15 cm) tortilla has 2.5 g of protein. Muffins -- 1 small muffin (2 oz or 57 g) has 3 g of protein. Oatmeal (cooked) --  cup (40 g) has 3 g of protein. Rice (cooked) --  cup (79 g) has 2.5-3.5 g of protein. Dairy Cream cheese -- 1 oz (29 g) has 2 g of protein. Creamer (half-and-half) -- 1 oz (29 mL) has 1 g of protein. Frozen yogurt --  cup (72 g) has 3 g of protein. Sour cream --  cup (75 g) has 2.5 g of protein.  Summary Protein is a nutrient that your body needs for growth and development, repairing and making cells and tissues, fighting infection, and providing energy. Protein is in both plant and animal foods. Some of these foods have more protein than others. Depending on your overall health, you may need more or less protein in your diet. Talk to your health care provider about how much protein you need.

## 2021-06-23 NOTE — Progress Notes (Signed)
  Medical Nutrition Therapy:  Appt start time: 1130 end time:  1205 Total time: 35 Visit # 1  06/23/2021 Nelwyn Hebdon [id number]  Assessment:  Primary concerns today: meal planning for her diabetes, weight loss and decreased appetite. Ms. Mccreadie says she reads labels to limit her carbohydrates. She did not bring her meter today and reports her blood sugar range from 62 to 300s. Today fasting it was in the low 100s. We discussed her diabetes medication effects on her weight and appetite as meal prep for eating healthy despite her decreased appetite. Her weight is 3# less than it was on 2017 at her last visit with me. She reports weighing her self 3 times a day.   Preferred Learning Style:No preference indicated  Learning Readiness:Ready; Change in progress  ANTHROPOMETRICS: Estimated body mass index is 23.2 kg/m as calculated from the following:   Height as of 06/12/21: 5\' 5"  (1.651 m).   Weight as of 06/12/21: 139 lb 6.4 oz (63.2 kg).  WEIGHT HISTORY: Wt Readings from Last 10 Encounters:  06/12/21 139 lb 6.4 oz (63.2 kg)  06/03/21 138 lb 14.4 oz (63 kg)  04/08/21 135 lb 12.8 oz (61.6 kg)  01/20/21 142 lb 12.8 oz (64.8 kg)  12/23/20 149 lb 9.6 oz (67.9 kg)  12/02/20 145 lb (65.8 kg)  09/17/20 149 lb 4.8 oz (67.7 kg)  12/05/19 152 lb 4.8 oz (69.1 kg)  10/31/19 152 lb 9.6 oz (69.2 kg)  09/12/19 154 lb 8 oz (70.1 kg)   SLEEP:need to assess at future visit  MEDICATIONS: Stopped xultophy and went back to lantus,  DIETARY INTAKE:Usual eating pattern includes 2-3 meals and ?  snacks per day. Dining Out (times/week): a few times a week 24-hr recall:  Breakfast: eggs, yogurt,  Lunch: chopped salad Dinner: cooks for family a meat, vegetable and starch like chicken fajitas or black bean dish over rice but says she only eats small amounts Beverages: water, tea both sweet and unsweet, red wine  Usual physical activity:need to assess at future visit  Progress Towards Goal(s):  In  progress.   Nutritional Diagnosis:  NB-1.1 Food and nutrition-related knowledge deficit As related to lack of recent and sufficient diabetes meal planning training.  As evidenced by her report and questions.    Intervention:  Nutrition education about ideas for recipes and simple mal idea that are still adequate and balanced. Action Goal: see patient instructions  Outcome goal: improved self confidence in diabetes meal planning Coordination of care:   Teaching Method Utilized: Visual, Auditory,Hands on Handouts given during visit include:recipes and  After visit summary Barriers to learning/adherence to lifestyle change: lack of material resources , depression lack of support Demonstrated degree of understanding via:  Teach Back   Monitoring/Evaluation:  Dietary intake, exercise, meter, and body weight in 4 week(s) Debera Lat, RD 06/23/2021 5:51 PM. .

## 2021-06-23 NOTE — Progress Notes (Signed)
Subjective:    Patient ID: Angie Cain, female    DOB: [birth year], 65 y.o.   MRN: [id number]  HPI Patient is a 65 y.o. female who presents for diabetes management. She is in good spirits and presents without assistance. Patient was referred on 06/03/21 and last seen by provider, Dr. Laural Golden, on 06/12/21.   Insurance coverage/medication affordability: Medicare/Medicaid  Current diabetes medications include: Patient reports continuing to take Synjardy 10-1000mg  once daily and Lantus 32 units once daily in the evening. Did not begin [place] or [place].  Current hypertension medications include: amlodipine 10mg , losartan 50mg  Current hyperlipidemia medications include: atorvastatin 40mg  Patient states that She is taking her medications as prescribed. Patient denies adherence with medications. Patient states that She misses her medications 1-2 times per week, on average. "If I am feeling bad I don't take any of my medicine."  Do you feel that your medications are working for you?  "Sometimes I do and then sometimes I don't know"  Have you been experiencing any side effects to the medications prescribed? Yes; stomach pain but not sure where it is coming from  Do you have any problems obtaining medications due to transportation or finances?  no    Patient reported dietary habits:  Eats 1-3 meals/day depending on how she feels. She states most days she does not feeling like eating much. She states she believes her blood glucose is often high because "I drink sugary drinks." Drinks:sweet tea; water; koolaid; lemonade; coffee; sweet red wine   Patient reports hypoglycemic event of 69.  Patient reports polyuria (increased urination).  Patient reports polyphagia (increased appetite).  Patient reports polydipsia (increased thirst).  Patient reports neuropathy (nerve pain). Patient reports visual changes; patient sees Dr. Katy Fitch and needs to re-schedule missed appt from February Patient reports  self foot exams.   Home fasting blood sugars: mid to high 100's; some in the 70's 2 hour post-meal/random blood sugars: 300's  Objective:   Labs:   Physical Exam Neurological:     Mental Status: She is alert and oriented to person, place, and time.    Review of Systems  Gastrointestinal:  Positive for abdominal pain and nausea.   Lab Results  Component Value Date   HGBA1C 11.0 (A) 04/08/2021   HGBA1C 9.4 (A) 12/23/2020   HGBA1C 9.4 (A) 09/17/2020    Lab Results  Component Value Date   MICRALBCREAT 11.2 05/24/2015    Lipid Panel     Component Value Date/Time   CHOL 101 12/23/2020 1124   TRIG 56 12/23/2020 1124   HDL 45 12/23/2020 1124   CHOLHDL 2.2 12/23/2020 1124   CHOLHDL 2.9 06/04/2014 1629   VLDL 12 06/04/2014 1629   LDLCALC 43 12/23/2020 1124    Clinical Atherosclerotic Cardiovascular Disease (ASCVD): No  The ASCVD Risk score [name]., et al., 2013) failed to calculate for the following reasons:   The valid total cholesterol range is 130 to 320 mg/dL   Assessment/Plan:   T2DM is not controlled likely due to sub-optimal adherence and dietary choices. Discussed with patient trial off of metformin per previous appt on 06/03/21 with Dr. Wynetta Emery to rule out this being a cause to patient's nausea. Patient will discontinue to [facility] and try Jardiance 10mg . At this time will hold-off on starting Xultophy as patient already experiencing GI discomfort and will continue Lantus. Following instruction patient verbalized understanding of treatment plan.    Continued basal insulin glargine (Lantus) 32 units once daily.  Initiated a trial of SGLT2-I  empagliflozin (Jardiance) 10mg   Extensively discussed pathophysiology of diabetes, dietary effects on blood sugar control, and recommended lifestyle interventions. Patient will adhere to dietary modifications of reducing drinks high in sugar Counseled on s/sx of and management of hypoglycemia Sent in referral for podiatry  per patient request; patient also due for annual foot exam Next A1C anticipated July 2022.   ASCVD risk - primary prevention in patient with diabetes. Last LDL is controlled.    Continued atorvastatin 40 mg.   Follow-up appointment 2 weeks to review sugar readings. Written patient instructions provided.  This appointment required 30 minutes of patient care (this includes precharting, chart review, review of results, and face-to-face care).  Thank you for involving pharmacy to assist in providing this patient's care

## 2021-06-23 NOTE — Patient Instructions (Addendum)
Angie Cain it was a pleasure seeing you today.   Please do the following:  STOP Synjardy and START Jardiance as directed today during your appointment. If you have any questions or if you believe something has occurred because of this change, call me or your doctor to let one of [place] know.  Continue Lantus and DO NOT take Xultophy. Continue checking blood sugars at home. It's really important that you record these and bring these in to your next doctor's appointment.  Continue making the lifestyle changes we've discussed together during our visit. Diet and exercise play a significant role in improving your blood sugars.  Follow-up with me on July 27th.    Hypoglycemia or low blood sugar:   Low blood sugar can happen quickly and may become an emergency if not treated right away.   While this shouldn't happen often, it can be brought upon if you skip a meal or do not eat enough. Also, if your insulin or other diabetes medications are dosed too high, this can cause your blood sugar to go to low.   Warning signs of low blood sugar include: Feeling shaky or dizzy Feeling weak or tired  Excessive hunger Feeling anxious or upset  Sweating even when you aren't exercising  What to do if I experience low blood sugar? Follow the Rule of 15 Check your blood sugar with your meter. If lower than 70, proceed to step 2.  Treat with 15 grams of fast acting carbs which is found in 3-4 glucose tablets. If none are available you can try hard candy, 1 tablespoon of sugar or honey,4 ounces of fruit juice, or 6 ounces of REGULAR soda.  Re-check your sugar in 15 minutes. If it is still below 70, do what you did in step 2 again. If your blood sugar has come back up, go ahead and eat a snack or small meal made up of complex carbs (ex. Whole grains) and protein at this time to avoid recurrence of low blood sugar.

## 2021-06-23 NOTE — BH Specialist Note (Signed)
Integrated Behavioral Health Follow Up In-Person Visit  MRN: [id number] Name: Angie Cain  Number of [place] Clinician visits: 6/6 Session Start time: 11:00am  Session End time: 11:45am Total time: 45  minutes  Types of Service: Individual psychotherapy  Interpretor:No. Interpretor Name and Language: n/a   Subjective: Angie Cain is a 65 y.o. female accompanied by  self Patient was referred by Dr. Jodell Cipro, MD for relational concerns in her marriage & Pt need to express her dissatisfaction. Patient reports the following symptoms/concerns: elevated anx/anger due to her lack of having her independence in her marital relationship w/Husb of 50 yrs Duration of problem: yrs; Severity of problem: moderate  Objective: Mood: Negative, Angry, and Anxious and Affect: Appropriate Risk of harm to self or others: No plan to harm self or others  Life Context: Family and Social: Pt lives w/Husb & has friends & family that value her. Pt desires more individuation from her Husb. School/Work: Pt does not work presently, but would like to find a job. Pt is not in school. Self-Care: Pt is trying new ways to care for self & to boost her mood daily.  Life Changes: Pt is examining her marriage & the changes she does not believe will happen btwn her & Husb. Pt does not want Husb to attend psychotherapy.  Patient and/or Family's Strengths/Protective Factors: Social connections, Social and Patent attorney, [place] supports in place (healthy food, safe environments, etc.), and Pt resilience  Goals Addressed: Patient will:  Reduce symptoms of: anxiety, depression, and stress, & marital unhappiness  Increase knowledge and/or ability of: coping skills, healthy habits, stress reduction, and the lifespan of a marriage & its dvlpmnt    Demonstrate ability to: Increase healthy adjustment to current life circumstances and be flexible in your own thinking as you expect Husb to  be also. Demonstrate to Husb by showing him-not telling him what you need.  Progress towards Goals: Ongoing  Interventions: Interventions utilized:  Solution-Focused Strategies, Behavioral Activation, and Supportive Counseling Standardized Assessments completed:  screeners prn  Patient and/or Family Response: Pt receptive to visit today & requests future sessions  Patient Centered Plan: Patient is on the following Treatment Plan(s): Inc your tolerance for frustration w/Husb by investing more wisely in the things that you directly benefit from in the marriage.  Assessment: Patient currently experiencing elevated dissatisfaction w/the state of her marriage.    Patient may benefit from cont'd work to better understand the lifespan trajectory of coupling & how to manage this in your unique situation.  Plan: Follow up with behavioral health clinician on : 2-3 wks, f:f for 60 min Behavioral recommendations: Begin to read about marriage to gather all the info you can for understanding & change Referral(s): [place] (In Clinic) "From scale of 1-10, how likely are you to follow plan?": [place], LMFT

## 2021-06-23 NOTE — Assessment & Plan Note (Signed)
T2DM is not controlled likely due to sub-optimal adherence and dietary choices. Discussed with patient trial off of metformin per previous appt on 06/03/21 with Dr. Wynetta Emery to rule out this being a cause to patient's nausea. Patient will discontinue to [facility] and try Jardiance 10mg . At this time will hold-off on starting Xultophy as patient already experiencing GI discomfort and will continue Lantus. Following instruction patient verbalized understanding of treatment plan.    1. Continued basal insulin glargine (Lantus) 32 units once daily.  2. Initiated a trial of SGLT2-I empagliflozin (Jardiance) 10mg   3. Extensively discussed pathophysiology of diabetes, dietary effects on blood sugar control, and recommended lifestyle interventions. 4. Patient will adhere to dietary modifications of reducing drinks high in sugar 5. Counseled on s/sx of and management of hypoglycemia 6. Next A1C anticipated July 2022.

## 2021-06-25 ENCOUNTER — Other Ambulatory Visit: Payer: Self-pay

## 2021-06-25 ENCOUNTER — Ambulatory Visit (INDEPENDENT_AMBULATORY_CARE_PROVIDER_SITE_OTHER): Payer: Medicare HMO | Admitting: Internal Medicine

## 2021-06-25 ENCOUNTER — Other Ambulatory Visit (HOSPITAL_COMMUNITY): Payer: Self-pay

## 2021-06-25 ENCOUNTER — Encounter: Payer: Self-pay | Admitting: Internal Medicine

## 2021-06-25 VITALS — BP 116/56 | HR 79 | Temp 98.5°F | Ht 65.0 in | Wt 139.2 lb

## 2021-06-25 DIAGNOSIS — E1165 Type 2 diabetes mellitus with hyperglycemia: Secondary | ICD-10-CM

## 2021-06-25 DIAGNOSIS — Z794 Long term (current) use of insulin: Secondary | ICD-10-CM | POA: Diagnosis not present

## 2021-06-25 DIAGNOSIS — I10 Essential (primary) hypertension: Secondary | ICD-10-CM

## 2021-06-25 DIAGNOSIS — M62838 Other muscle spasm: Secondary | ICD-10-CM

## 2021-06-25 DIAGNOSIS — IMO0002 Reserved for concepts with insufficient information to code with codable children: Secondary | ICD-10-CM

## 2021-06-25 MED ORDER — CYCLOBENZAPRINE HCL 5 MG PO TABS: 7.5000 mg | ORAL_TABLET | Freq: Three times a day (TID) | ORAL | Status: DC | PRN

## 2021-06-25 NOTE — Patient Instructions (Addendum)
[name], it was a pleasure seeing you today!  Today we discussed: Right Sided Muscle Spasm: Your muscle spasm appears to be improving. We recommended Voltaren gel which you did not try. I will prescribe 5 days of flexeril along with recommendation Voltaren gel, IcyHot, and ibuprofen. I am also giving you exercises for this spasm to perform daily.  Diabetes-From your last A1c, your diabetes seems uncontrolled. I will have you follow up after 2 weeks to repeat A1c and make necessary changes. Blood Pressure: Your blood pressure is controlled. Continue taking your medications as previously.    I have ordered the following labs today:  Lab Orders  No laboratory test(s) ordered today      Referrals ordered today:   Referral Orders  No referral(s) requested today     I have ordered the following medication/changed the following medications:   Stop the following medications: Medications Discontinued During This Encounter  Medication Reason   cyclobenzaprine (FLEXERIL) 5 MG tablet      Start the following medications: Meds ordered this encounter  Medications   cyclobenzaprine (FLEXERIL) tablet 7.5 mg     Follow-up: 2 weeks  Please make sure to arrive 15 minutes prior to your next appointment. If you arrive late, you may be asked to reschedule.   We look forward to seeing you next time. Please call our clinic at [phone] if you have any questions or concerns. The best time to call is Monday-Friday from 9am-4pm, but there is someone available 24/7. If after hours or the weekend, call the main hospital number and ask for the Internal Medicine Resident On-Call. If you need medication refills, please notify your pharmacy one week in advance and they will send [place] a request.  Thank you for letting us take part in your care. Wishing you the best!  Thank you, Idamae Schuller, MD

## 2021-06-26 ENCOUNTER — Other Ambulatory Visit: Payer: Self-pay | Admitting: Internal Medicine

## 2021-06-26 ENCOUNTER — Other Ambulatory Visit (HOSPITAL_COMMUNITY): Payer: Self-pay

## 2021-06-26 DIAGNOSIS — IMO0002 Reserved for concepts with insufficient information to code with codable children: Secondary | ICD-10-CM

## 2021-06-26 DIAGNOSIS — E1165 Type 2 diabetes mellitus with hyperglycemia: Secondary | ICD-10-CM

## 2021-06-27 ENCOUNTER — Other Ambulatory Visit: Payer: Self-pay | Admitting: Student

## 2021-06-27 ENCOUNTER — Telehealth: Payer: Self-pay

## 2021-06-27 ENCOUNTER — Other Ambulatory Visit (HOSPITAL_COMMUNITY): Payer: Self-pay

## 2021-06-27 DIAGNOSIS — M62838 Other muscle spasm: Secondary | ICD-10-CM

## 2021-06-27 MED ORDER — CYCLOBENZAPRINE HCL 5 MG PO TABS
5.0000 mg | ORAL_TABLET | Freq: Three times a day (TID) | ORAL | 0 refills | Status: AC | PRN
  Filled 2021-06-27: qty 15, 5d supply, fill #0

## 2021-06-27 NOTE — Telephone Encounter (Signed)
Received TC from patient who states she had an office visit on 7/13 and was told flexeril would be sent to [facility] pharmacy.  States it is still not there and the pharmacy is closing after today for renovations. LOV states a 5 day course of flexeril will be sent, RN does not see where this was sent via [place]. Will send to red team. Laurence Compton, RN,BSN

## 2021-06-27 NOTE — Telephone Encounter (Signed)
Flexeril RX sent via [place] by Dr. Collene Gobble.  Pharmacy called and confirmed they did receive this and patient was notified. SChaplin, RN,BSN

## 2021-06-30 ENCOUNTER — Encounter: Payer: Self-pay | Admitting: Internal Medicine

## 2021-06-30 NOTE — Progress Notes (Signed)
CC: "I have right chest pain"  HPI:  AngieAngie Cain is a 65 y.o. with medical history as below presenting to [facility] for a follow up on right sided chest pain  Please see problem-based list for further details, assessments, and plans.  Past Medical History:  Diagnosis Date   Allergy    Anxiety    occasional   Arthritis    Atherosclerotic peripheral vascular disease with intermittent claudication ([place])    s/p fem-fem bypass 2011. ABI 07/2012 0.5 R 0.65 L   Diabetes mellitus without complication (HCC)    GERD (gastroesophageal reflux disease)    Hyperlipidemia    Hypertension    IDDM (insulin dependent diabetes mellitus) 07/21/2007   July 2011: Femoral-femoral bypass  ABI 08/11/2012 notable for 0.4-0.59 on right ankle suggestive of severe arterial occlusive disease at rest and 0.60-0.7 on left ankle suggestive of moderate arterial occlusive disease at rest.    Iron deficiency anemia 05/08/2017   Personal history of colonic polyps - adenomas 03/07/2014   Tobacco abuse    Review of Systems:    Review of Systems  Constitutional:  Negative for chills, fever and weight loss.  HENT:  Negative for ear pain, hearing loss, sinus pain and tinnitus.   Eyes:  Negative for blurred vision, double vision and pain.  Respiratory:  Negative for cough, hemoptysis, sputum production and shortness of breath.   Cardiovascular:  Positive for chest pain (right sided under the breast). Negative for palpitations and orthopnea.  Gastrointestinal:  Negative for abdominal pain, blood in stool, diarrhea, heartburn, melena, nausea and vomiting.  Genitourinary:  Negative for dysuria, frequency and urgency.  Musculoskeletal:  Negative for back pain, myalgias and neck pain.  Skin:  Negative for itching and rash.  Neurological:  Negative for dizziness, tingling, loss of consciousness, weakness and headaches.  Endo/Heme/Allergies:  Negative for polydipsia. Does not bruise/bleed easily.  Psychiatric/Behavioral:   Negative for depression, substance abuse and suicidal ideas.    Physical Exam:  Vitals:   06/25/21 1434  BP: (!) 116/56  Pulse: 79  Temp: 98.5 F (36.9 C)  TempSrc: Oral  SpO2: 100%  Weight: 139 lb 3.2 oz (63.1 kg)  Height: 5\' 5"  (1.651 m)    Physical Exam Constitutional:      General: She is not in acute distress.    Appearance: Normal appearance. She is not ill-appearing.  HENT:     Head: Normocephalic and atraumatic.     Right Ear: External ear normal.     Left Ear: External ear normal.     Nose: Nose normal.     Mouth/Throat:     Mouth: Mucous membranes are moist.     Pharynx: Oropharynx is clear.  Eyes:     Extraocular Movements: Extraocular movements intact.     Conjunctiva/sclera: Conjunctivae normal.     Pupils: Pupils are equal, round, and reactive to light.  Cardiovascular:     Rate and Rhythm: Normal rate and regular rhythm.     Pulses: Normal pulses.     Heart sounds: Normal heart sounds. No murmur heard.   No friction rub. No gallop.  Pulmonary:     Effort: Pulmonary effort is normal. No respiratory distress.     Breath sounds: Normal breath sounds. No stridor. No wheezing or rhonchi.  Chest:     Chest wall: Tenderness (right sided) present.  Abdominal:     General: Bowel sounds are normal. There is no distension.     Palpations: Abdomen is soft. There is  no mass.     Tenderness: There is abdominal tenderness in the right upper quadrant.  Musculoskeletal:        General: No swelling or tenderness. Normal range of motion.     Cervical back: Normal range of motion and neck supple.  Skin:    Capillary Refill: Capillary refill takes less than 2 seconds.     Coloration: Skin is not jaundiced.     Findings: No erythema, lesion or rash.  Neurological:     General: No focal deficit present.     Mental Status: She is alert and oriented to person, place, and time. Mental status is at baseline.  Psychiatric:        Mood and Affect: Mood normal.         Behavior: Behavior normal.    Assessment & Plan:   See Encounters Tab for problem based charting.  Patient seen with Dr. Odella Aquas, MD

## 2021-06-30 NOTE — Assessment & Plan Note (Signed)
Assessment: Patient's blood pressure is under control and patient endorses adherence.  Plan: -Continue current medications

## 2021-06-30 NOTE — Assessment & Plan Note (Signed)
Assessment: Patient has diabetes that is uncontrolled with Jardiance and Lantus. Her last A1c in April 2022 was 11.0. Her regimen was modified after that A1c. Will obtain a new A1c at follow up and make necessary adjustments then.  Plan: -Continue current regimen -Repeat A1c in 2 weeks

## 2021-06-30 NOTE — Assessment & Plan Note (Signed)
Assessment: Patient continues to have right sided chest pain under the breast. She stated she did not try the Voltaren gel. Her pain is consistent with muscle strain. Recommended chest exercises, Voltaren gel, IcyHot, and five day course of Flexeril.  Plan: -Initiate chest exercises -Start Voltaren gel, IcyHot -Five day course of Flexeril 7.5 mg

## 2021-07-07 ENCOUNTER — Other Ambulatory Visit: Payer: Self-pay

## 2021-07-07 DIAGNOSIS — E1165 Type 2 diabetes mellitus with hyperglycemia: Secondary | ICD-10-CM

## 2021-07-07 DIAGNOSIS — IMO0002 Reserved for concepts with insufficient information to code with codable children: Secondary | ICD-10-CM

## 2021-07-07 DIAGNOSIS — I10 Essential (primary) hypertension: Secondary | ICD-10-CM

## 2021-07-07 MED ORDER — LOSARTAN POTASSIUM 50 MG PO TABS: ORAL_TABLET | Freq: Every day | ORAL | 1 refills | Status: DC

## 2021-07-07 MED ORDER — INSULIN PEN NEEDLE 31G X 5 MM MISC: 1 refills | Status: DC

## 2021-07-07 MED ORDER — ACCU-CHEK GUIDE VI STRP: ORAL_STRIP | 1 refills | Status: DC

## 2021-07-07 NOTE — Progress Notes (Signed)
Internal Medicine Clinic Attending  I saw and evaluated the patient.  I personally confirmed the key portions of the history and exam documented by Dr. Khan and I reviewed pertinent patient test results.  The assessment, diagnosis, and plan were formulated together and I agree with the documentation in the resident's note.  

## 2021-07-08 ENCOUNTER — Ambulatory Visit (INDEPENDENT_AMBULATORY_CARE_PROVIDER_SITE_OTHER): Payer: Medicare HMO | Admitting: Podiatry

## 2021-07-08 ENCOUNTER — Other Ambulatory Visit: Payer: Self-pay

## 2021-07-08 DIAGNOSIS — M2012 Hallux valgus (acquired), left foot: Secondary | ICD-10-CM

## 2021-07-08 DIAGNOSIS — M2041 Other hammer toe(s) (acquired), right foot: Secondary | ICD-10-CM

## 2021-07-08 DIAGNOSIS — E1142 Type 2 diabetes mellitus with diabetic polyneuropathy: Secondary | ICD-10-CM | POA: Diagnosis not present

## 2021-07-08 DIAGNOSIS — M2011 Hallux valgus (acquired), right foot: Secondary | ICD-10-CM

## 2021-07-08 DIAGNOSIS — E1169 Type 2 diabetes mellitus with other specified complication: Secondary | ICD-10-CM | POA: Diagnosis not present

## 2021-07-08 DIAGNOSIS — E119 Type 2 diabetes mellitus without complications: Secondary | ICD-10-CM

## 2021-07-08 DIAGNOSIS — B351 Tinea unguium: Secondary | ICD-10-CM | POA: Diagnosis not present

## 2021-07-08 DIAGNOSIS — M2042 Other hammer toe(s) (acquired), left foot: Secondary | ICD-10-CM

## 2021-07-08 NOTE — Progress Notes (Signed)
  Subjective:  Patient ID: Angie Cain, female    DOB: [birth year],  MRN: [id number]  Chief Complaint  Patient presents with   Foot Orthotics    Diabetic shoes requested. Dr. Linwood Dibbles.   Nail Problem    RFC Nail trim bilateral nails 1-5.    65 y.o. female presents with the above complaint. History confirmed with patient.   Objective:  Physical Exam: warm, good capillary refill, nail exam onychomycosis of the toenails, no trophic changes or ulcerative lesions. DP pulses palpable, PT pulses palpable, and protective sensation intact HAV bilat, hamemrtoes bilat.  No images are attached to the encounter.  Assessment:   1. Onychomycosis of multiple toenails with type 2 diabetes mellitus and peripheral neuropathy ([place])   2. Acquired hallux valgus of both feet   3. Hammer toes of both feet   4. Encounter for diabetic foot exam [facility])      Plan:  Patient was evaluated and treated and all questions answered.  Onychomycosis, Diabetes, and DPN -Patient is diabetic with a qualifying condition for at risk foot care. Likely small fiber neuropathy -Would benefit from DM shoes. Will make appt for fabrication -Educated on DM Footcare.  Procedure: Nail Debridement Type of Debridement: manual, sharp debridement. Instrumentation: Nail nipper, rotary burr. Number of Nails: 10  No follow-ups on file.

## 2021-07-09 ENCOUNTER — Other Ambulatory Visit (HOSPITAL_COMMUNITY): Payer: Self-pay

## 2021-07-09 ENCOUNTER — Ambulatory Visit (INDEPENDENT_AMBULATORY_CARE_PROVIDER_SITE_OTHER): Payer: Medicare HMO | Admitting: Pharmacist

## 2021-07-09 ENCOUNTER — Ambulatory Visit: Payer: Medicare HMO | Admitting: Dietician

## 2021-07-09 ENCOUNTER — Ambulatory Visit (INDEPENDENT_AMBULATORY_CARE_PROVIDER_SITE_OTHER): Payer: Medicare HMO | Admitting: Dietician

## 2021-07-09 DIAGNOSIS — Z794 Long term (current) use of insulin: Secondary | ICD-10-CM | POA: Diagnosis not present

## 2021-07-09 DIAGNOSIS — IMO0002 Reserved for concepts with insufficient information to code with codable children: Secondary | ICD-10-CM

## 2021-07-09 DIAGNOSIS — E1165 Type 2 diabetes mellitus with hyperglycemia: Secondary | ICD-10-CM | POA: Diagnosis not present

## 2021-07-09 MED ORDER — INSULIN GLARGINE 100 UNIT/ML SOLOSTAR PEN
30.0000 [IU] | PEN_INJECTOR | Freq: Every day | SUBCUTANEOUS | 1 refills | Status: DC
  Filled 2021-07-09: qty 15, 50d supply, fill #0

## 2021-07-09 MED ORDER — METFORMIN HCL ER 750 MG PO TB24
750.0000 mg | ORAL_TABLET | Freq: Every day | ORAL | 0 refills | Status: DC
  Filled 2021-07-09: qty 30, 30d supply, fill #0

## 2021-07-09 NOTE — Progress Notes (Signed)
Subjective:    Patient ID: Angie Cain, female    DOB: 02-26-1956, 65 y.o.   MRN: [id number]  HPI Patient is a 65 y.o. female who presents for diabetes management. She is in good spirits and presents without assistance. Patient was referred on 06/03/21 and last seen by provider, Dr. Humphrey Rolls, on 06/25/21. Last seen in pharmacy clinic on 06/23/21.  Patient reports that she has been "not great" due to stress in her life as well as not being sure of her medications. She states she has had a lot going on so it is challenging to care for herself.   Insurance coverage/medication affordability: Medicare/Medicaid  Current diabetes medications include: Lantus 32 units once daily in the evening and Jardiance '10mg'$  once daily. Current hypertension medications include: amlodipine '10mg'$ , losartan '50mg'$  Current hyperlipidemia medications include: atorvastatin '40mg'$  Patient states that She is taking her medications as prescribed. Patient continues to deny adherence with medications. Patient states that She misses her medications 1-2 times per week, on average, which is no change from last visit. Continues to endorse "If I am feeling bad I don't take any of my medicine."  Patient previously reported dietary habits:  Eats 1-3 meals/day depending on how she feels. She states most days she does not feeling like eating much. She states she believes her blood glucose is often high because "I drink sugary drinks." Drinks:sweet tea; water; koolaid; lemonade; coffee; sweet red wine Patient has appt with Debera Lat, RD, today and will discuss diet further.   Patient reports hypoglycemic events of 69, 60, 65 Patient reports polyuria (increased urination).  Patient reports polyphagia (increased appetite).  Patient reports polydipsia (increased thirst).  Patient reports neuropathy (nerve pain). Patient reports visual changes; patient sees Dr. Katy Fitch and needs to re-schedule missed appt from February Patient reports self  foot exams.   Home fasting blood sugars: 60-231; mostly in low 100's 2 hour post-meal/random blood sugars: 120-501  Objective:   Labs:   Physical Exam Neurological:     Mental Status: She is alert and oriented to person, place, and time.    Review of Systems  Gastrointestinal:  Positive for nausea.   Lab Results  Component Value Date   HGBA1C 11.0 (A) 04/08/2021   HGBA1C 9.4 (A) 12/23/2020   HGBA1C 9.4 (A) 09/17/2020    Lab Results  Component Value Date   MICRALBCREAT 11.2 05/24/2015    Lipid Panel     Component Value Date/Time   CHOL 101 12/23/2020 1124   TRIG 56 12/23/2020 1124   HDL 45 12/23/2020 1124   CHOLHDL 2.2 12/23/2020 1124   CHOLHDL 2.9 06/04/2014 1629   VLDL 12 06/04/2014 1629   LDLCALC 43 12/23/2020 1124    Clinical Atherosclerotic Cardiovascular Disease (ASCVD): No  The ASCVD Risk score [name]., et al., 2013) failed to calculate for the following reasons:   The valid total cholesterol range is 130 to 320 mg/dL   Assessment/Plan:   T2DM is not controlled likely still due to continued issues with adherence and diet. Patient also experienced more frequent episodes of hypoglycemia than previously. At last appointment patient was discontinued Synjardy as we were attempting to rule out metformin as a cause of patient's nausea and abdominal pain. Nausea is still present at appointment today therefore metformin likely not cause. Will re-initiate metformin XR '750mg'$  once daily and monitor patient for side effects. Will continue to hold Xultophy due to patient continuing to experience nausea. Will decrease Lantus from 32 to 30 units (6%  decrease) once daily due to frequent hypoglycemic episodes and switch administration time from evening to morning.   Decreased insulin glargine (Lantus) to 30 units once daily in morning Continued GLT2-I empagliflozin (Jardiance) '10mg'$   Started metformin XR 750 mg once daily Extensively discussed pathophysiology of diabetes,  dietary effects on blood sugar control, and recommended lifestyle interventions. Patient will adhere to dietary modifications of reducing drinks high in sugar Counseled on s/sx of and management of hypoglycemia Sent in referral for podiatry per patient request; patient also due for annual foot exam Next A1C anticipated July 2022.   Follow-up appointment 3 weeks to review sugar readings. Written patient instructions provided.  This appointment required 45 minutes of direct patient care.   Thank you for involving pharmacy to assist in providing this patient's care

## 2021-07-09 NOTE — Patient Instructions (Addendum)
Angie Cain it was a pleasure seeing you today.   Please do the following:  Decrease your insulin (Lantus) to 30 units and take it in the morning as directed today during your appointment. If you have any questions or if you believe something has occurred because of this change, call me or your doctor to let one of [place] know.  I sent in a new prescription for metformin XR '750mg'$ . You will just take this once a day with breakfast Continue checking blood sugars at home. It's really important that you record these and bring these in to your next doctor's appointment.  Continue making the lifestyle changes we've discussed together during our visit. Diet and exercise play a significant role in improving your blood sugars.  Follow-up with me on August 17th at 10:00 AM    Hypoglycemia or low blood sugar:   Low blood sugar can happen quickly and may become an emergency if not treated right away.   While this shouldn't happen often, it can be brought upon if you skip a meal or do not eat enough. Also, if your insulin or other diabetes medications are dosed too high, this can cause your blood sugar to go to low.   Warning signs of low blood sugar include: Feeling shaky or dizzy Feeling weak or tired  Excessive hunger Feeling anxious or upset  Sweating even when you aren't exercising  What to do if I experience low blood sugar? Follow the Rule of 15 Check your blood sugar with your meter. If lower than 70, proceed to step 2.  Treat with 15 grams of fast acting carbs which is found in 3-4 glucose tablets. If none are available you can try hard candy, 1 tablespoon of sugar or honey,4 ounces of fruit juice, or 6 ounces of REGULAR soda.  Re-check your sugar in 15 minutes. If it is still below 70, do what you did in step 2 again. If your blood sugar has come back up, go ahead and eat a snack or small meal made up of complex carbs (ex. Whole grains) and protein at this time to avoid recurrence of low blood  sugar.

## 2021-07-09 NOTE — Assessment & Plan Note (Signed)
T2DM is not controlled likely still due to continued issues with adherence and diet. Patient also experienced more frequent episodes of hypoglycemia than previously. At last appointment patient was discontinued Synjardy as we were attempting to rule out metformin as a cause of patient's nausea and abdominal pain. Nausea is still present at appointment today therefore metformin likely not cause. Will re-initiate metformin XR '750mg'$  once daily and monitor patient for side effects. Will continue to hold Xultophy due to patient continuing to experience nausea. Will decrease Lantus from 32 to 30 units (6% decrease) once daily due to frequent hypoglycemic episodes and switch administration time from evening to morning.   1. Decreased insulin glargine (Lantus) to 30 units once daily in morning 2. Continued GLT2-I empagliflozin (Jardiance) '10mg'$   3. Started metformin XR 750 mg once daily 4. Extensively discussed pathophysiology of diabetes, dietary effects on blood sugar control, and recommended lifestyle interventions. 5. Patient will adhere to dietary modifications of reducing drinks high in sugar 6. Counseled on s/sx of and management of hypoglycemia 7. Sent in referral for podiatry per patient request; patient also due for annual foot exam 8. Next A1C anticipated July 2022.   Follow-up appointment 3 weeks to review sugar readings. Written patient instructions provided.

## 2021-07-09 NOTE — Progress Notes (Signed)
  Medical Nutrition Therapy:  Appt start time: 1100 end time:  1240 Total time:40 minutes Visit # 2  Assessment:  Primary concerns today: meal planning for her diabetes. . Ms. Givler says she reads labels to limit her carbohydrates. She brought her meter today. It shows in target fasting with a spike midday and sometimes in target or slightly above target it the evening. We discussed difference between checking blood sugar with metr vs CGM. She will think about a trial of using a CGM.  Her weight  was not updated today as it is at goal. It appears her blood sugars are improving.  METER DOWNLOAD Report summary is from last 30 days,  Average tests per day: 1.2 Average blood glucose: 146 Range: minimum: 60 and maximum: 502 Days without test: - % in target range: 24/37 % above target range: 6/37 % hypoglycemia/below target: 7/37 Notes about patterns: low fasting with large spikes midday, no blood sugars overnight  SLEEP:need to assess at future visit  MEDICATIONS: lantus decreased today to 30 units, metformin added DIETARY INTAKE:Usual eating pattern includes 2-3 meals and ?  snacks per day. Dining Out (times/week): a few times a week 24-hr recall:  Breakfast: eggs, yogurt, fruit Lunch: small amount of potato or beans, sweet tea Dinner: cooks for family a meat, vegetable and starch like chicken fajitas or black bean dish over rice but says she only eats small amounts Beverages: water, tea both sweet and unsweet, red wine  Usual physical activity:need to assess at future visit  Progress Towards Goal(s):  Some progress.   Nutritional Diagnosis:  NB-1.1 Food and nutrition-related knowledge deficit As related to lack of recent and sufficient diabetes meal planning training.  As evidenced by her report and questions.    Intervention:  Nutrition education encouraging balanced small meals and snacks that include vegetables, healthy fats and protein. Action Goal: decrease sugar in sweat tea   Outcome goal: improved self confidence in diabetes meal planning Coordination of care: discussed care and diabetes medicine changes with pharmacy   Teaching Method Utilized: Visual, Auditory,Hands on Handouts given during visit include:recipes and  After visit summary Barriers to learning/adherence to lifestyle change: lack of material resources , depression lack of support Demonstrated degree of understanding via:  Teach Back   Monitoring/Evaluation:  Dietary intake, exercise, meter, and body weight in 4 week(s) to follow up on change to unsweet tea, then 12 weeks Debera Lat, RD 07/09/2021 10:39 AM. .

## 2021-07-14 ENCOUNTER — Telehealth: Payer: Self-pay | Admitting: Behavioral Health

## 2021-07-14 ENCOUNTER — Ambulatory Visit: Payer: Medicare HMO | Admitting: Behavioral Health

## 2021-07-14 NOTE — Telephone Encounter (Signed)
Contacted Pt multiple times today for 1:00pm session. Could only lv one msg on home phone reminding of appt @ 1:00pm. Have utilized home & mobile phones w/no msg capacity.  Dr. Theodis Shove

## 2021-07-17 ENCOUNTER — Other Ambulatory Visit: Payer: Medicare HMO

## 2021-07-17 ENCOUNTER — Ambulatory Visit (INDEPENDENT_AMBULATORY_CARE_PROVIDER_SITE_OTHER): Payer: Medicare HMO | Admitting: Internal Medicine

## 2021-07-17 ENCOUNTER — Other Ambulatory Visit: Payer: Self-pay

## 2021-07-17 ENCOUNTER — Telehealth: Payer: Self-pay | Admitting: Student

## 2021-07-17 ENCOUNTER — Other Ambulatory Visit: Payer: Self-pay | Admitting: *Deleted

## 2021-07-17 ENCOUNTER — Encounter: Payer: Self-pay | Admitting: Internal Medicine

## 2021-07-17 ENCOUNTER — Telehealth: Payer: Self-pay | Admitting: *Deleted

## 2021-07-17 ENCOUNTER — Other Ambulatory Visit (HOSPITAL_COMMUNITY): Payer: Self-pay

## 2021-07-17 VITALS — BP 146/83 | HR 78 | Temp 98.7°F | Ht 65.0 in | Wt 136.3 lb

## 2021-07-17 DIAGNOSIS — E1165 Type 2 diabetes mellitus with hyperglycemia: Secondary | ICD-10-CM

## 2021-07-17 DIAGNOSIS — R109 Unspecified abdominal pain: Secondary | ICD-10-CM | POA: Diagnosis not present

## 2021-07-17 DIAGNOSIS — Z794 Long term (current) use of insulin: Secondary | ICD-10-CM | POA: Diagnosis not present

## 2021-07-17 DIAGNOSIS — IMO0002 Reserved for concepts with insufficient information to code with codable children: Secondary | ICD-10-CM

## 2021-07-17 LAB — POCT GLYCOSYLATED HEMOGLOBIN (HGB A1C): Hemoglobin A1C: 11 % — AB (ref 4.0–5.6)

## 2021-07-17 LAB — GLUCOSE, CAPILLARY: Glucose-Capillary: 222 mg/dL — ABNORMAL HIGH (ref 70–99)

## 2021-07-17 MED ORDER — TIMOLOL MALEATE 0.5 % OP SOLN: OPHTHALMIC | 11 refills | Status: AC

## 2021-07-17 MED ORDER — METHOCARBAMOL 750 MG PO TABS
ORAL_TABLET | ORAL | 0 refills | Status: AC
  Filled 2021-07-17: qty 27, 6d supply, fill #0

## 2021-07-17 MED ORDER — NAPROXEN 500 MG PO TABS
500.0000 mg | ORAL_TABLET | Freq: Two times a day (BID) | ORAL | 0 refills | Status: AC
  Filled 2021-07-17: qty 20, 10d supply, fill #0

## 2021-07-17 MED ORDER — INSULIN GLARGINE 100 UNIT/ML SOLOSTAR PEN: 30.0000 [IU] | PEN_INJECTOR | Freq: Every day | SUBCUTANEOUS | 1 refills | Status: DC

## 2021-07-17 NOTE — Telephone Encounter (Signed)
Return pt's call - stated the pain underneath her right breast,radiating to her back is back and worse. She was prescribed Flexeril which is no longer helping. Stated she can come today to see someone; no available appts on red team -  appt given with Dr Court Joy @ 1515 PM.

## 2021-07-17 NOTE — Telephone Encounter (Signed)
Diabetic shoe measurement-07/17/21

## 2021-07-17 NOTE — Telephone Encounter (Signed)
Pt requesting a call back. Patient reporting she not feeling any better since her last visit. Patient states she is having back spasms again that are really hurting and she would like to know what to do now.

## 2021-07-17 NOTE — Patient Instructions (Signed)
Thank you for trusting me with your care. To recap, today we discussed the following:  Flank pain I have written medications to help with muscle pain, however also I am sending you for lab work to rule out other possible causes.  - Urinalysis, Complete (81001) - CMP14 + Anion Gap - CBC with Diff - methocarbamol (ROBAXIN) 750 MG tablet; Take 2 tablets (1,500 mg total) by mouth 3 (three) times daily for 3 days, THEN 1 tablet (750 mg total) 3 (three) times daily for 3 days.  Dispense: 27 tablet; Refill: 0 - naproxen (NAPROSYN) 500 MG tablet; Take 1 tablet (500 mg total) by mouth 2 (two) times daily with a meal for 10 days.  Dispense: 20 tablet; Refill: 0  Insulin dependent type 2 diabetes mellitus, uncontrolled ([place]) - POC Hbg A1C

## 2021-07-18 LAB — CBC WITH DIFFERENTIAL/PLATELET
Basophils Absolute: 0 10*3/uL (ref 0.0–0.2)
Basos: 1 %
EOS (ABSOLUTE): 0.1 10*3/uL (ref 0.0–0.4)
Eos: 1 %
Hematocrit: 43 % (ref 34.0–46.6)
Hemoglobin: 13.1 g/dL (ref 11.1–15.9)
Immature Grans (Abs): 0 10*3/uL (ref 0.0–0.1)
Immature Granulocytes: 0 %
Lymphocytes Absolute: 2.7 10*3/uL (ref 0.7–3.1)
Lymphs: 43 %
MCH: 24 pg — ABNORMAL LOW (ref 26.6–33.0)
MCHC: 30.5 g/dL — ABNORMAL LOW (ref 31.5–35.7)
MCV: 79 fL (ref 79–97)
Monocytes Absolute: 0.6 10*3/uL (ref 0.1–0.9)
Monocytes: 10 %
Neutrophils Absolute: 2.9 10*3/uL (ref 1.4–7.0)
Neutrophils: 45 %
Platelets: 351 10*3/uL (ref 150–450)
RBC: 5.46 x10E6/uL — ABNORMAL HIGH (ref 3.77–5.28)
RDW: 13.6 % (ref 11.7–15.4)
WBC: 6.3 10*3/uL (ref 3.4–10.8)

## 2021-07-18 LAB — CMP14 + ANION GAP
ALT: 8 IU/L (ref 0–32)
AST: 9 IU/L (ref 0–40)
Albumin/Globulin Ratio: 1.5 (ref 1.2–2.2)
Albumin: 4.1 g/dL (ref 3.8–4.8)
Alkaline Phosphatase: 123 IU/L — ABNORMAL HIGH (ref 44–121)
Anion Gap: 16 mmol/L (ref 10.0–18.0)
BUN/Creatinine Ratio: 12 (ref 12–28)
BUN: 8 mg/dL (ref 8–27)
Bilirubin Total: 0.3 mg/dL (ref 0.0–1.2)
CO2: 24 mmol/L (ref 20–29)
Calcium: 9.6 mg/dL (ref 8.7–10.3)
Chloride: 101 mmol/L (ref 96–106)
Creatinine, Ser: 0.65 mg/dL (ref 0.57–1.00)
Globulin, Total: 2.8 g/dL (ref 1.5–4.5)
Glucose: 230 mg/dL — ABNORMAL HIGH (ref 65–99)
Potassium: 3.8 mmol/L (ref 3.5–5.2)
Sodium: 141 mmol/L (ref 134–144)
Total Protein: 6.9 g/dL (ref 6.0–8.5)
eGFR: 96 mL/min/{1.73_m2} (ref >59)

## 2021-07-18 LAB — URINALYSIS, COMPLETE
Bilirubin, UA: NEGATIVE
Ketones, UA: NEGATIVE
Leukocytes,UA: NEGATIVE
Nitrite, UA: NEGATIVE
RBC, UA: NEGATIVE
Specific Gravity, UA: >=1.03 — AB (ref 1.005–1.030)
Urobilinogen, Ur: 0.2 mg/dL (ref 0.2–1.0)
pH, UA: 6.5 (ref 5.0–7.5)

## 2021-07-18 LAB — MICROSCOPIC EXAMINATION
Bacteria, UA: NONE SEEN
Casts: NONE SEEN /lpf
WBC, UA: NONE SEEN /hpf (ref 0–5)

## 2021-07-19 ENCOUNTER — Encounter: Payer: Self-pay | Admitting: Internal Medicine

## 2021-07-19 NOTE — Assessment & Plan Note (Signed)
  Hgb A1c 11 % at the last visit. Current A1c 11%. She does not have her glucometer with her as she was here for another acute concern. Seen approximately a week ago and I see adjustments have already been made. See previous note under this problem for details. Patient has an appointment scheduled and I will defer to [facility] [place].

## 2021-07-19 NOTE — Assessment & Plan Note (Addendum)
Patient presents for ongoing flank pain. She first notice pain when cleaning her oven 2 months ago. The pain is on her right side, starts posterior axillary and wraps around to anterior axillary below rib cage. Pain has been constant since it started, nothing specific has made it worse, and Flexeril and Voltaren gel have not helped with pain. She has occasional had some vaginal itching recently, but not ongoing.  Denies any painful urination, vaginal discharge , hematuria, history of kidney stones, shingles, recent rash, strenuous activity, abdominal surgeries, association with eating, hx of gallstones, or excessive consumption of alcohol.   Assessment/Plan: Flank pain, likely MSK in etiology, tenderness to light touch along right flank. Pain does wrap around to RUQ of abdomen. Previous UA in April I do see Oxalate crystals noted. Will obtain UA and CMP.Continue treatment for MSK pain.  - Urinalysis, Complete (81001) - CMP14 + Anion Gap - CBC with Diff - methocarbamol (ROBAXIN) 750 MG tablet; Take 2 tablets (1,500 mg total) by mouth 3 (three) times daily for 3 days, THEN 1 tablet (750 mg total) 3 (three) times daily for 3 days.  Dispense: 27 tablet; Refill: 0 - naproxen (NAPROSYN) 500 MG tablet; Take 1 tablet (500 mg total) by mouth 2 (two) times daily with a meal for 10 days.  Dispense: 20 tablet; Refill: 0  Addendum: Called patient day after starting treatment. No sign of infection or kidney stones on UA. CMP does not suggest acute process.  Alk phos elevated, but expect this is from uncontrolled T2DM. Improving with treatment for MSK. If not completely resolving after treatment patient will schedule appointment. Review of last colonoscopy shows precancerous polyps removed and follow up recommended in 2023 for repeat colonoscopy. I would send for colonoscopy if not resolving.  Patient hemoglobin A1C elevated ~11, upcoming appointment with CPP in our clinic who is adjusting regimen.

## 2021-07-19 NOTE — Progress Notes (Signed)
   CC: Flank pain   HPI:[name] is a 65 y.o. female who presents for evaluation of flank pain. Please see individual problem based A/P for details.  Past Medical History:  Diagnosis Date   Allergy    Anxiety    occasional   Arthritis    Atherosclerotic peripheral vascular disease with intermittent claudication ([place])    s/p fem-fem bypass 2011. ABI 07/2012 0.5 R 0.65 L   Diabetes mellitus without complication (HCC)    GERD (gastroesophageal reflux disease)    Hyperlipidemia    Hypertension    IDDM (insulin dependent diabetes mellitus) 07/21/2007   July 2011: Femoral-femoral bypass  ABI 08/11/2012 notable for 0.4-0.59 on right ankle suggestive of severe arterial occlusive disease at rest and 0.60-0.7 on left ankle suggestive of moderate arterial occlusive disease at rest.    Iron deficiency anemia 05/08/2017   Low back pain 12/18/2013   Personal history of colonic polyps - adenomas 03/07/2014   Rotator cuff impingement syndrome of left shoulder 03/23/2016   Tobacco abuse    Review of Systems:   Review of Systems  Constitutional:  Negative for chills and fever.  Gastrointestinal:  Positive for abdominal pain (Flank and abdominal pain). Negative for blood in stool and constipation.  Musculoskeletal:  Negative for falls and joint pain.    Physical Exam: Vitals:   07/17/21 1528  BP: (!) 146/83  Pulse: 78  Temp: 98.7 F (37.1 C)  TempSrc: Oral  SpO2: 100%  Weight: 136 lb 4.8 oz (61.8 kg)  Height: '5\' 5"'$  (1.651 m)   General: NAD, nl appearance HEENT: Conjunctiva nl , antiicteric sclerae, moist mucous membranes, no exudate or erythema Cardiovascular: Normal rate, regular rhythm.  No murmurs, rubs, or gallops Pulmonary : Equal breath sounds, No wheezes, rales, or rhonchi Abdominal: soft, tender to light palpation on right flank , pain wraps around from posterior axillary to anterior axillary Ext: No edema in lower extremities, no tenderness to palpation of lower extremities.   Skin: No rash, no erythema.  Assessment & Plan:   See Encounters Tab for problem based charting.  Patient discussed with Dr. Angelia Mould

## 2021-07-21 ENCOUNTER — Other Ambulatory Visit: Payer: Self-pay | Admitting: Student

## 2021-07-21 DIAGNOSIS — IMO0002 Reserved for concepts with insufficient information to code with codable children: Secondary | ICD-10-CM

## 2021-07-21 DIAGNOSIS — E1165 Type 2 diabetes mellitus with hyperglycemia: Secondary | ICD-10-CM

## 2021-07-21 MED ORDER — INSULIN GLARGINE 100 UNIT/ML SOLOSTAR PEN: 30.0000 [IU] | PEN_INJECTOR | Freq: Every day | SUBCUTANEOUS | 1 refills | Status: DC

## 2021-07-21 NOTE — Progress Notes (Signed)
[facility] requesting 135 mL of Lantus. Prescription has been modified.

## 2021-07-29 NOTE — Progress Notes (Signed)
Internal Medicine Clinic Attending  Case discussed with Dr. Steen  At the time of the visit.  We reviewed the resident's history and exam and pertinent patient test results.  I agree with the assessment, diagnosis, and plan of care documented in the resident's note.  

## 2021-07-30 ENCOUNTER — Ambulatory Visit (INDEPENDENT_AMBULATORY_CARE_PROVIDER_SITE_OTHER): Payer: Medicare HMO | Admitting: Pharmacist

## 2021-07-30 ENCOUNTER — Other Ambulatory Visit (HOSPITAL_COMMUNITY): Payer: Self-pay

## 2021-07-30 DIAGNOSIS — E1165 Type 2 diabetes mellitus with hyperglycemia: Secondary | ICD-10-CM | POA: Diagnosis not present

## 2021-07-30 DIAGNOSIS — IMO0002 Reserved for concepts with insufficient information to code with codable children: Secondary | ICD-10-CM

## 2021-07-30 DIAGNOSIS — Z794 Long term (current) use of insulin: Secondary | ICD-10-CM

## 2021-07-30 MED ORDER — METFORMIN HCL ER 500 MG PO TB24: 500.0000 mg | ORAL_TABLET | Freq: Two times a day (BID) | ORAL | 2 refills | Status: DC

## 2021-07-30 MED ORDER — EMPAGLIFLOZIN 10 MG PO TABS
10.0000 mg | ORAL_TABLET | Freq: Every day | ORAL | 0 refills | Status: DC
  Filled 2021-07-30: qty 30, 30d supply, fill #0

## 2021-07-30 MED ORDER — INSULIN GLARGINE 100 UNIT/ML SOLOSTAR PEN
30.0000 [IU] | PEN_INJECTOR | Freq: Every day | SUBCUTANEOUS | 1 refills | Status: DC
  Filled 2021-07-30: qty 15, 50d supply, fill #0

## 2021-07-30 NOTE — Progress Notes (Signed)
Subjective:    Patient ID: Angie Cain, female    DOB: [birth year], 65 y.o.   MRN: [id number]  HPI Patient is a 65 y.o. female who presents for diabetes management. She is in good spirits and presents without assistance. Patient was referred on 06/03/21 and last seen by provider, Dr. Court Cain, on 07/17/21. Last seen in pharmacy clinic on 06/23/21.  Patient reports that she has been "not great" due to stress in her life as well as not being sure of her medications. She states she has had a lot going on so it is challenging to care for herself.   Patient states she has been much more adherent to her medications and she starting taking all of them daily. The only medication she states she will occasionally skip will be her Jardiance if "I feel some urinary effects like itching coming on." Patient states that when she skips the medication the itching will subside and she will resume the medication.   Insurance coverage/medication affordability: Medicare/Medicaid  Current diabetes medications include: Lantus 30 units once daily in the evening and Jardiance '10mg'$  once daily, metformin XR '750mg'$  once daily Current hypertension medications include: amlodipine '10mg'$ , losartan '50mg'$  Current hyperlipidemia medications include: atorvastatin '40mg'$  Patient states that She is taking her medications as prescribed.  Patient previously reported dietary habits:  Eats 1-3 meals/day depending on how she feels. She states most days she does not feeling like eating much and knows that eating will increase her blood glucose. She states she believes her blood glucose is often high because "I drink sugary drinks." Drinks:Patient reports diluting her sweet tea and lemonade with water now   Patient reports hypoglycemic events of 69 in which she was woke up sweating Patient denies polyuria (increased urination).  Patient denies polyphagia (increased appetite).  Patient denies polydipsia (increased thirst).  Patient reports  neuropathy (nerve pain). Patient reports visual changes; patient sees Dr. Katy Cain and needs to re-schedule missed appt from February Patient reports self foot exams.   Home fasting blood sugars: 69-260; Mostly between 80-150  Objective:   Labs:   Physical Exam Neurological:     Mental Status: She is alert and oriented to person, place, and time.    Review of Systems  Gastrointestinal:  Positive for abdominal pain. Negative for diarrhea, nausea and vomiting.   Lab Results  Component Value Date   HGBA1C 11.0 (A) 07/17/2021   HGBA1C 11.0 (A) 04/08/2021   HGBA1C 9.4 (A) 12/23/2020    Lab Results  Component Value Date   MICRALBCREAT 11.2 05/24/2015   Lipid Panel     Component Value Date/Time   CHOL 101 12/23/2020 1124   TRIG 56 12/23/2020 1124   HDL 45 12/23/2020 1124   CHOLHDL 2.2 12/23/2020 1124   CHOLHDL 2.9 06/04/2014 1629   VLDL 12 06/04/2014 1629   LDLCALC 43 12/23/2020 1124    Clinical Atherosclerotic Cardiovascular Disease (ASCVD): No  The ASCVD Risk score [name]., et al., 2013) failed to calculate for the following reasons:   The valid total cholesterol range is 130 to 320 mg/dL   Assessment/Plan:   T2DM is not controlled likely still due to continued issues with adherence and sub-optimal diet. Patient also experienced more frequent episodes of hypoglycemia than previously. Patient currently tolerating all medications well therefore will continue Jardiance and Lantus and increase metformin too '500mg'$  BID. Will continue to hold Xultophy due to patient continuing to experience some slight stomach pain and will plan to initiate once resolved. Reminded patient  option to switch administration time of Lantus from evening to morning to help minimize hypoglycemic events. Patient agreeable to change administration time. Patient verbalized understanding of plan.   Continued insulin glargine (Lantus) to 30 units once daily in morning Continued GLT2-I empagliflozin  (Jardiance) '10mg'$   Increased metformin XR 750 mg once daily to '500mg'$  BID. Extensively discussed pathophysiology of diabetes, dietary effects on blood sugar control, and recommended lifestyle interventions. Patient will adhere to dietary modifications of reducing drinks high in sugar Counseled on s/sx of and management of hypoglycemia Next A1C anticipated November.   Follow-up appointment 3 weeks to review sugar readings. Written patient instructions provided.  This appointment required 40 minutes of direct patient care.   Thank you for involving pharmacy to assist in providing this patient's care

## 2021-07-30 NOTE — Assessment & Plan Note (Signed)
T2DM is not controlled likely still due to continued issues with adherence and sub-optimal diet. Patient also experienced more frequent episodes of hypoglycemia than previously. Patient currently tolerating all medications well therefore will continue Jardiance and Lantus and increase metformin too '500mg'$  BID. Will continue to hold Xultophy due to patient continuing to experience some slight stomach pain and will plan to initiate once resolved. Reminded patient option to switch administration time of Lantus from evening to morning to help minimize hypoglycemic events. Patient agreeable to change administration time. Patient verbalized understanding of plan.   1. Continued insulin glargine (Lantus) to 30 units once daily in morning 2. Continued GLT2-I empagliflozin (Jardiance) '10mg'$   3. Increased metformin XR 750 mg once daily to '500mg'$  BID. 4. Extensively discussed pathophysiology of diabetes, dietary effects on blood sugar control, and recommended lifestyle interventions. 5. Patient will adhere to dietary modifications of reducing drinks high in sugar 6. Counseled on s/sx of and management of hypoglycemia 7. Next A1C anticipated November.   Follow-up appointment 3 weeks to review sugar readings. Written patient instructions provided.

## 2021-07-30 NOTE — Patient Instructions (Addendum)
Angie Cain it was a pleasure seeing you today.   Please do the following:  Increase your metformin to '500mg'$  twice a day as directed today during your appointment. If you have any questions or if you believe something has occurred because of this change, call me or your doctor to let one of [place] know.  Continue checking blood sugars at home. It's really important that you record these and bring these in to your next doctor's appointment.  Continue making the lifestyle changes we've discussed together during our visit. Diet and exercise play a significant role in improving your blood sugars.  Follow-up with me in three weeks on September 7th at 9:30 AM.    Hypoglycemia or low blood sugar:   Low blood sugar can happen quickly and may become an emergency if not treated right away.   While this shouldn't happen often, it can be brought upon if you skip a meal or do not eat enough. Also, if your insulin or other diabetes medications are dosed too high, this can cause your blood sugar to go to low.   Warning signs of low blood sugar include: Feeling shaky or dizzy Feeling weak or tired  Excessive hunger Feeling anxious or upset  Sweating even when you aren't exercising  What to do if I experience low blood sugar? Follow the Rule of 15 Check your blood sugar with your meter. If lower than 70, proceed to step 2.  Treat with 15 grams of fast acting carbs which is found in 3-4 glucose tablets. If none are available you can try hard candy, 1 tablespoon of sugar or honey,4 ounces of fruit juice, or 6 ounces of REGULAR soda.  Re-check your sugar in 15 minutes. If it is still below 70, do what you did in step 2 again. If your blood sugar has come back up, go ahead and eat a snack or small meal made up of complex carbs (ex. Whole grains) and protein at this time to avoid recurrence of low blood sugar.

## 2021-07-31 ENCOUNTER — Ambulatory Visit: Payer: Medicare HMO | Admitting: Behavioral Health

## 2021-07-31 ENCOUNTER — Other Ambulatory Visit (HOSPITAL_COMMUNITY): Payer: Self-pay

## 2021-07-31 ENCOUNTER — Other Ambulatory Visit: Payer: Self-pay

## 2021-07-31 DIAGNOSIS — F419 Anxiety disorder, unspecified: Secondary | ICD-10-CM

## 2021-07-31 DIAGNOSIS — F331 Major depressive disorder, recurrent, moderate: Secondary | ICD-10-CM

## 2021-07-31 NOTE — BH Specialist Note (Signed)
Integrated Behavioral Health via Telemedicine Visit  07/31/2021 Angie Cain [id number]  Number of [place] visits: 7 Session Start time: 11:30am  Session End time: 12:00pm Total time: 30  Referring Provider: Dr. Jodell Cipro, MD Patient/Family location: Pt is home [facility] Provider location: [facility] Office All persons participating in visit: Pt & Clinician Types of Service: Individual psychotherapy  I connected with Angie Cain and/or Angie Cain's  self  via  Telephone or Geologist, engineering  (Video is Tree surgeon) and verified that I am speaking with the correct person using two identifiers. Discussed confidentiality:  7th visit  I discussed the limitations of telemedicine and the availability of in person appointments.  Discussed there is a possibility of technology failure and discussed alternative modes of communication if that failure occurs.  I discussed that engaging in this telemedicine visit, they consent to the provision of behavioral healthcare and the services will be billed under their insurance.  Patient and/or legal guardian expressed understanding and consented to Telemedicine visit:  7th visit  Presenting Concerns: Patient and/or family reports the following symptoms/concerns: elevated anx/dep due to need for more social time & time outside the house Duration of problem: months; Severity of problem: moderate  Patient and/or Family's Strengths/Protective Factors: Social connections, Social and Emotional competence, Concrete supports in place (healthy food, safe environments, etc.), Physical Health (exercise, healthy diet, medication compliance, etc.), and Pt is trying to keep up w/all her appt  Goals Addressed: Patient will:  Reduce symptoms of: anxiety, depression, and stress   Increase knowledge and/or ability of: coping skills and stress reduction   Demonstrate ability to: Increase healthy adjustment to  current life circumstances  Progress towards Goals: Ongoing  Interventions: Interventions utilized:  Solution-Focused Strategies and Supportive Counseling Standardized Assessments completed:  screeners prn  Patient and/or Family Response: Pt receptive to call today & prefers f:f next visit.  Assessment: Patient currently experiencing elevated anx/dep due to many health issues & her efforts to manage her life.  Patient may benefit from cont'd support for life's circumstances that bring Pt's spirits down.  Plan: Follow up with behavioral health clinician on : 2-3 wks for 60 min f:f Behavioral recommendations: get your hair fixed according to your standards & dec-mkg Referral(s): [place] (In Clinic)  I discussed the assessment and treatment plan with the patient and/or parent/guardian. They were provided an opportunity to ask questions and all were answered. They agreed with the plan and demonstrated an understanding of the instructions.   They were advised to call back or seek an in-person evaluation if the symptoms worsen or if the condition fails to improve as anticipated.  Donnetta Hutching, LMFT

## 2021-08-07 LAB — HM DIABETES EYE EXAM

## 2021-08-15 ENCOUNTER — Other Ambulatory Visit: Payer: Self-pay | Admitting: Internal Medicine

## 2021-08-15 DIAGNOSIS — Z1231 Encounter for screening mammogram for malignant neoplasm of breast: Secondary | ICD-10-CM

## 2021-08-20 ENCOUNTER — Encounter: Payer: Medicare HMO | Admitting: Pharmacist

## 2021-08-25 ENCOUNTER — Telehealth: Payer: Self-pay | Admitting: Podiatry

## 2021-08-25 NOTE — Telephone Encounter (Signed)
Pt left message checking on status of diabetic shoes.  Returned call and left message for pt that the shoes are on back order and expected to be released 9.17 and when they come in I will call pt to schedule an appt to pick them up.

## 2021-08-27 ENCOUNTER — Other Ambulatory Visit: Payer: Self-pay

## 2021-08-27 ENCOUNTER — Ambulatory Visit: Payer: Medicare HMO | Admitting: Behavioral Health

## 2021-08-27 ENCOUNTER — Emergency Department (HOSPITAL_COMMUNITY)
Admission: EM | Admit: 2021-08-27 | Discharge: 2021-08-27 | Disposition: A | Discharge status: Home / Self Care | Payer: Medicare HMO | Attending: Emergency Medicine | Admitting: Emergency Medicine

## 2021-08-27 ENCOUNTER — Encounter: Payer: Self-pay | Admitting: Dietician

## 2021-08-27 ENCOUNTER — Encounter (HOSPITAL_COMMUNITY): Payer: Self-pay

## 2021-08-27 ENCOUNTER — Emergency Department (HOSPITAL_COMMUNITY): Payer: Medicare HMO

## 2021-08-27 DIAGNOSIS — F331 Major depressive disorder, recurrent, moderate: Secondary | ICD-10-CM

## 2021-08-27 DIAGNOSIS — R319 Hematuria, unspecified: Secondary | ICD-10-CM | POA: Diagnosis not present

## 2021-08-27 DIAGNOSIS — E119 Type 2 diabetes mellitus without complications: Secondary | ICD-10-CM | POA: Diagnosis not present

## 2021-08-27 DIAGNOSIS — Z7982 Long term (current) use of aspirin: Secondary | ICD-10-CM | POA: Insufficient documentation

## 2021-08-27 DIAGNOSIS — Z794 Long term (current) use of insulin: Secondary | ICD-10-CM | POA: Diagnosis not present

## 2021-08-27 DIAGNOSIS — B9689 Other specified bacterial agents as the cause of diseases classified elsewhere: Secondary | ICD-10-CM | POA: Diagnosis not present

## 2021-08-27 DIAGNOSIS — R300 Dysuria: Secondary | ICD-10-CM | POA: Diagnosis present

## 2021-08-27 DIAGNOSIS — N390 Urinary tract infection, site not specified: Secondary | ICD-10-CM | POA: Insufficient documentation

## 2021-08-27 DIAGNOSIS — I10 Essential (primary) hypertension: Secondary | ICD-10-CM | POA: Diagnosis not present

## 2021-08-27 DIAGNOSIS — F17210 Nicotine dependence, cigarettes, uncomplicated: Secondary | ICD-10-CM | POA: Insufficient documentation

## 2021-08-27 DIAGNOSIS — Z7984 Long term (current) use of oral hypoglycemic drugs: Secondary | ICD-10-CM | POA: Insufficient documentation

## 2021-08-27 DIAGNOSIS — Z79899 Other long term (current) drug therapy: Secondary | ICD-10-CM | POA: Insufficient documentation

## 2021-08-27 DIAGNOSIS — F419 Anxiety disorder, unspecified: Secondary | ICD-10-CM

## 2021-08-27 DIAGNOSIS — N3001 Acute cystitis with hematuria: Secondary | ICD-10-CM

## 2021-08-27 LAB — I-STAT CHEM 8, ED
BUN: 8 mg/dL (ref 8–23)
Calcium, Ion: 1.2 mmol/L (ref 1.15–1.40)
Chloride: 101 mmol/L (ref 98–111)
Creatinine, Ser: 0.6 mg/dL (ref 0.44–1.00)
Glucose, Bld: 169 mg/dL — ABNORMAL HIGH (ref 70–99)
HCT: 44 % (ref 36.0–46.0)
Hemoglobin: 15 g/dL (ref 12.0–15.0)
Potassium: 3.1 mmol/L — ABNORMAL LOW (ref 3.5–5.1)
Sodium: 142 mmol/L (ref 135–145)
TCO2: 31 mmol/L (ref 22–32)

## 2021-08-27 LAB — CBC WITH DIFFERENTIAL/PLATELET
Abs Immature Granulocytes: 0.02 10*3/uL (ref 0.00–0.07)
Basophils Absolute: 0 10*3/uL (ref 0.0–0.1)
Basophils Relative: 1 %
Eosinophils Absolute: 0 10*3/uL (ref 0.0–0.5)
Eosinophils Relative: 1 %
HCT: 43.2 % (ref 36.0–46.0)
Hemoglobin: 13.3 g/dL (ref 12.0–15.0)
Immature Granulocytes: 0 %
Lymphocytes Relative: 33 %
Lymphs Abs: 2.6 10*3/uL (ref 0.7–4.0)
MCH: 24.4 pg — ABNORMAL LOW (ref 26.0–34.0)
MCHC: 30.8 g/dL (ref 30.0–36.0)
MCV: 79.4 fL — ABNORMAL LOW (ref 80.0–100.0)
Monocytes Absolute: 0.7 10*3/uL (ref 0.1–1.0)
Monocytes Relative: 9 %
Neutro Abs: 4.6 10*3/uL (ref 1.7–7.7)
Neutrophils Relative %: 56 %
Platelets: 343 10*3/uL (ref 150–400)
RBC: 5.44 MIL/uL — ABNORMAL HIGH (ref 3.87–5.11)
RDW: 15.8 % — ABNORMAL HIGH (ref 11.5–15.5)
WBC: 8 10*3/uL (ref 4.0–10.5)
nRBC: 0 % (ref 0.0–0.2)

## 2021-08-27 LAB — URINALYSIS, ROUTINE W REFLEX MICROSCOPIC
Bilirubin Urine: NEGATIVE
Glucose, UA: >=500 mg/dL — AB
Ketones, ur: NEGATIVE mg/dL
Nitrite: NEGATIVE
RBC / HPF: >50 RBC/hpf — ABNORMAL HIGH (ref 0–5)
Specific Gravity, Urine: 1.01 (ref 1.005–1.030)
WBC, UA: >50 WBC/hpf — ABNORMAL HIGH (ref 0–5)
pH: 6 (ref 5.0–8.0)

## 2021-08-27 MED ORDER — ONDANSETRON 4 MG PO TBDP: 4.0000 mg | ORAL_TABLET | Freq: Three times a day (TID) | ORAL | 0 refills | Status: DC | PRN

## 2021-08-27 MED ORDER — CEPHALEXIN 500 MG PO CAPS: 500.0000 mg | ORAL_CAPSULE | Freq: Two times a day (BID) | ORAL | 0 refills | Status: AC

## 2021-08-27 NOTE — Discharge Instructions (Addendum)
Take the antibiotics as prescribed  Return for new or worsening symptoms  Follow up with Primary care provider in 1 week for re-evaluation

## 2021-08-27 NOTE — ED Provider Notes (Signed)
Seen by previous provider. See note for full HPI  In summation here with dysuria, pink tinged urine.  UA pos for UTI  FU on labs, imaging  Likely dc home with abx for UTI Physical Exam  BP 119/75   Pulse 80   Temp 98.4 F (36.9 C) (Oral)   Resp 18   SpO2 99%   Physical Exam Vitals and nursing note reviewed.  Constitutional:      General: She is not in acute distress.    Appearance: She is well-developed. She is not ill-appearing or diaphoretic.  HENT:     Head: Atraumatic.  Eyes:     Pupils: Pupils are equal, round, and reactive to light.  Cardiovascular:     Rate and Rhythm: Normal rate and regular rhythm.  Pulmonary:     Effort: Pulmonary effort is normal. No respiratory distress.  Abdominal:     General: There is no distension.     Palpations: Abdomen is soft.  Musculoskeletal:        General: Normal range of motion.     Cervical back: Normal range of motion and neck supple.  Skin:    General: Skin is warm and dry.  Neurological:     General: No focal deficit present.     Mental Status: She is alert and oriented to person, place, and time.    ED Course/Procedures   Clinical Course as of 08/27/21 1721  Wed Aug 27, 2021  1527 Dysuria, No flank pain, no gross hematuria. Plan to dc home after UA [BH]    Clinical Course User Index [BH] Lorrene Graef A, PA-C    Procedures Labs Reviewed  URINALYSIS, ROUTINE W REFLEX MICROSCOPIC - Abnormal; Notable for the following components:      Result Value   APPearance CLOUDY (*)    Glucose, UA >=500 (*)    Hgb urine dipstick LARGE (*)    Protein, ur TRACE (*)    Leukocytes,Ua SMALL (*)    RBC / HPF >50 (*)    WBC, UA >50 (*)    Bacteria, UA FEW (*)    All other components within normal limits  CBC WITH DIFFERENTIAL/PLATELET - Abnormal; Notable for the following components:   RBC 5.44 (*)    MCV 79.4 (*)    MCH 24.4 (*)    RDW 15.8 (*)    All other components within normal limits  I-STAT CHEM 8, ED -  Abnormal; Notable for the following components:   Potassium 3.1 (*)    Glucose, Bld 169 (*)    All other components within normal limits  URINE CULTURE   CT Renal Stone Study  Result Date: 08/27/2021 CLINICAL DATA:  Hematuria. EXAM: CT ABDOMEN AND PELVIS WITHOUT CONTRAST TECHNIQUE: Multidetector CT imaging of the abdomen and pelvis was performed following the standard protocol without IV contrast. COMPARISON:  CT angiography chest abdomen and pelvis 12/24/2018. CT abdomen and pelvis 06/13/2011 FINDINGS: Lower chest: No acute abnormality. Hepatobiliary: There is a stable indeterminate 2.8 by 1.2 cm hypodense lesion in the left lobe of the liver. This is unchanged from 2012 and most likely benign such is favored with benign etiology such as hemangioma. No new liver lesions are seen. Gallbladder surgically absent. There is no biliary ductal dilatation. Pancreas: Unremarkable. No pancreatic ductal dilatation or surrounding inflammatory changes. Spleen: Normal in size without focal abnormality. Adrenals/Urinary Tract: There are few punctate nonobstructing bilateral renal calculi. No ureteral calculi are seen. There is no hydronephrosis or perinephric fat stranding. The  bladder is decompressed and within normal limits. Stomach/Bowel: Stomach is within normal limits. Appendix appears normal. No evidence of bowel wall thickening, distention, or inflammatory changes. There is sigmoid colon diverticulosis without evidence for acute diverticulitis. Vascular/Lymphatic: There is severe atherosclerotic calcifications of the aorta and branch vessels similar to the prior study. IVC is normal in size. Fem-fem bypass graft is present. No enlarged lymph nodes are identified. Reproductive: Status post hysterectomy. No adnexal masses. Other: No abdominal wall hernia or abnormality. No abdominopelvic ascites. Musculoskeletal: Disc bulges present at L4-L5 causing some central canal stenosis. Multilevel degenerative changes affect  the spine. No acute fractures are seen. IMPRESSION: 1. Nonobstructing bilateral renal calculi. 2. Sigmoid colon diverticulosis without evidence for acute diverticulitis. 3. Disc bulge at L4-L5 causing central canal stenosis. This can be further evaluated with MRI if clinically warranted. 4.  Aortic Atherosclerosis (ICD10-I70.0). Electronically Signed   By: Ronney Asters M.D.   On: 08/27/2021 16:12    MDM  UA with UTI  Imaging and labs WNL  Dc home with Keflex, close PCP FU  The patient has been appropriately medically screened and/or stabilized in the ED. I have low suspicion for any other emergent medical condition which would require further screening, evaluation or treatment in the ED or require inpatient management.  Patient is hemodynamically stable and in no acute distress.  Patient able to ambulate in department prior to ED.  Evaluation does not show acute pathology that would require ongoing or additional emergent interventions while in the emergency department or further inpatient treatment.  I have discussed the diagnosis with the patient and answered all questions.  Pain is been managed while in the emergency department and patient has no further complaints prior to discharge.  Patient is comfortable with plan discussed in room and is stable for discharge at this time.  I have discussed strict return precautions for returning to the emergency department.  Patient was encouraged to follow-up with PCP/specialist refer to at discharge.        Keishon Chavarin A, PA-C 08/27/21 1721    Fredia Sorrow, MD 09/05/21 [phone]

## 2021-08-27 NOTE — BH Specialist Note (Signed)
Integrated Behavioral Health Follow Up In-Person Visit  MRN: [id number] Name: Angie Cain  Number of [place] Clinician visits:  8 Session Start time: 10:00am  Session End time: 10:50am Total time: 50  minutes  Types of Service: Individual psychotherapy  Interpretor:No. Interpretor Name and Language: n/a  Subjective: Angie Cain is a 65 y.o. female accompanied by  self Patient was referred by Dr. Jodell Cipro, MD for mental health wellness. Patient reports the following symptoms/concerns: today Pt is perseverating over a bad haircut that was an, "insulting, humiliating exp". The Instructor @ the [employer] was disrespectful when Pt did not like the outcome of her hair. Duration of problem: over one month; Severity of problem: moderate  Objective: Mood: Negative and Angry and Affect:  congruent w/mood Risk of harm to self or others: No plan to harm self or others  Life Context: Family and Social: Pt lives w/her [employer] School/Work: Pt does not work & Pt is not in Sch Self-Care: Pt has positive self-care strategies Life Changes: Pt has been derailed over this awful exp & is consulting an Atty to see if she has options against the Ind or the [employer]  Patient and/or Family's Strengths/Protective Factors: Social connections, Social and Emotional competence, and Concrete supports in place (healthy food, safe environments, etc.)  Goals Addressed: Patient will:  Reduce symptoms of: agitation, anxiety, depression, mood instability, and stress   Increase knowledge and/or ability of: coping skills   Demonstrate ability to: Increase healthy adjustment to current life circumstances  Progress towards Goals: Ongoing  Interventions: Interventions utilized:  Solution-Focused Strategies and alternative perspective taking Standardized Assessments completed: screeners prn  Patient and/or Family Response: Pt receptive to visit today & requests future appt  Patient Centered  Plan: Patient is on the following Treatment Plan(s): Address irritability w/others when Pt feels out of control. Keep your perspective & pick the battles that matter. Remember that others are not allowed to define you. Assessment: Patient currently experiencing elevated anx due to recent bad experience @ [employer] that challenged Pt belief system about her identity & others' integrity.   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  Plan: Follow up with behavioral health clinician on : 2-3 wks for 60 min f:f Behavioral recommendations: None @ this time Referral(s): [place] (In Clinic) "From scale of 1-10, how likely are you to follow plan?": 0  Donnetta Hutching, LMFT

## 2021-08-27 NOTE — ED Provider Notes (Signed)
[name] Provider Note   CSN: [id number] Arrival date & time: 08/27/21  1212     History Chief Complaint  Patient presents with   Dysuria    Angie Cain is a 65 y.o. female presents to the ED for urinary urgency, frequency, dysuria, pink-tinged urine with intermittent lower abdominal cramps gradually worsening since yesterday.  She denies any nausea, vomiting, fevers, or chills.  Denies any flank pain or back pain. Medical history includes insulin-dependent diabetes, hypertension, hyperlipidemia.  Denies any surgical history.  Allergic to Flagyl.   Dysuria Associated symptoms: no abdominal pain, no fever, no flank pain and no vomiting       Past Medical History:  Diagnosis Date   Allergy    Anxiety    occasional   Arthritis    Atherosclerotic peripheral vascular disease with intermittent claudication ([place])    s/p fem-fem bypass 2011. ABI 07/2012 0.5 R 0.65 L   Diabetes mellitus without complication (HCC)    GERD (gastroesophageal reflux disease)    Hyperlipidemia    Hypertension    IDDM (insulin dependent diabetes mellitus) 07/21/2007   July 2011: Femoral-femoral bypass  ABI 08/11/2012 notable for 0.4-0.59 on right ankle suggestive of severe arterial occlusive disease at rest and 0.60-0.7 on left ankle suggestive of moderate arterial occlusive disease at rest.    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   Abdominal pain 02/19/2018   Allergic rhinitis 10/16/2017   Major depression  10/16/2017   Healthcare maintenance 03/18/2017   Osteopenia determined by x-ray 01/25/2015   Bilateral plantar fasciitis 09/30/2014   Hx of adenomatous colonic polyps 03/07/2014   GERD (gastroesophageal reflux disease) 06/06/2013   Hyperlipidemia associated with type 2 diabetes mellitus ([place]) 06/06/2013   Tobacco use disorder 07/21/2007   Essential hypertension 07/21/2007   Insulin dependent type 2 diabetes mellitus, uncontrolled ([place]) 07/20/1993    Past Surgical History:  Procedure Laterality Date   VAGINAL HYSTERECTOMY     VEIN BYPASS SURGERY  06/2010   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     Family History  Problem Relation Age of Onset   Diabetes Mother    Diabetes Father    Diabetes Sister    Hypertension Sister    Diabetes Brother    Hypertension Brother    Diabetes Son    Diabetes Brother    Diabetes Brother    Diabetes Brother    Diabetes Brother    Diabetes Brother    Colon cancer Neg Hx    Esophageal cancer Neg Hx    Stomach cancer Neg Hx    Rectal cancer Neg Hx    Breast cancer Neg Hx     Social History   Tobacco Use   Smoking status: Every Day    Packs/day: 0.10    Types: Cigarettes   Smokeless tobacco: Never   Tobacco comments:    Smokes 2-4 cigs/day.  Vaping Use   Vaping Use: Never  used  Substance Use Topics   Alcohol use: Yes    Alcohol/week: 1.0 - 2.0 standard drink    Types: 1 - 2 Glasses of wine per week    Comment: weekly   Drug use: No    Home Medications Prior to Admission medications   Medication Sig Start Date End Date Taking? Authorizing Provider  ALEVE 220 MG tablet Take 220-440 mg by mouth 2 (two) times daily as needed (for pain).   Yes [provider]  amLODipine (NORVASC) 10 MG tablet TAKE 1 TABLET (10 MG TOTAL) BY MOUTH DAILY. Patient taking differently: Take 10 mg by mouth daily. 12/23/20 12/23/21 Yes Lacinda Axon, MD  aspirin EC 81 MG tablet Take 81 mg by mouth  daily.   Yes [provider]  atorvastatin (LIPITOR) 40 MG tablet TAKE ONE (1) TABLET BY MOUTH EVERY DAY Patient taking differently: Take 40 mg by mouth at bedtime. 10/18/20  Yes Virl Axe, MD  calcium-vitamin D (OSCAL WITH D) 500-200 MG-UNIT tablet Take 1 tablet by mouth 2 (two) times daily. 03/17/19  Yes Chundi, Vahini, MD  cyclobenzaprine (FLEXERIL) 5 MG tablet Take 5 mg by mouth 3 (three) times daily as needed for muscle spasms.   Yes [provider]  diclofenac Sodium (VOLTAREN) 1 % GEL Apply 2 g topically 4 (four) times daily. Patient taking differently: Apply 2 g topically 4 (four) times daily as needed (for pain). 06/12/21  Yes Rehman, Areeg N, DO  empagliflozin (JARDIANCE) 10 MG TABS tablet Take 1 tablet (10 mg total) by mouth daily before breakfast. 07/30/21  Yes Aldine Contes, MD  insulin glargine (LANTUS) 100 UNIT/ML Solostar Pen Inject 30 Units into the skin daily. 07/30/21  Yes Aldine Contes, MD  latanoprost (XALATAN) 0.005 % ophthalmic solution PLACE 1 DROP INTO BOTH EYES AT BEDTIME. 06/27/21  Yes Sanjuan Dame, MD  losartan (COZAAR) 50 MG tablet TAKE 1 TABLET BY MOUTH ONCE DAILY Patient taking differently: Take 50 mg by mouth at bedtime. 07/07/21 07/07/22 Yes Lacinda Axon, MD  metFORMIN (GLUCOPHAGE XR) 500 MG 24 hr tablet Take 1 tablet (500 mg total) by mouth 2 (two) times daily with a meal. 07/30/21  Yes Aldine Contes, MD  timolol (TIMOPTIC) 0.5 % ophthalmic solution INSTILL 1 DROP INTO BOTH EYES EVERY MORNING 07/17/21 07/17/22 Yes A[place], Charisse March, MD  Accu-Chek FastClix Lancets MISC CHECK BLOOD SUGAR 3 TIMES A DAY 06/27/21   Sanjuan Dame, MD  Alcohol Swabs (B-D SINGLE USE SWABS REGULAR) PADS 1 each by Does not apply route 6 (six) times daily. 02/26/20   Lars Mage, MD  Blood Glucose Calibration (ACCU-CHEK AVIVA) SOLN Use to check controls on diabetic glucose meter monthly 02/26/20   Chundi, Verne Spurr, MD  Blood Glucose Monitoring Suppl  (ACCU-CHEK AVIVA PLUS) w/Device KIT Check blood sugar up to 3 times a day 02/26/20   Chundi, Verne Spurr, MD  gabapentin (NEURONTIN) 300 MG capsule TAKE 1 CAPSULE BY MOUTH EVERY NIGHT AT BEDTIME X 7 DAYS, THEN 1 CAPSULE BY MOUTH 2 TIMES DAILY X 7 DAYS, THEN 1 CAPSULE BY MOUTH 3 TIMES DAILY Patient not taking: Reported on 08/27/2021 02/06/21 02/06/22  Laroy Apple, MD  glucose blood (ACCU-CHEK GUIDE) test strip Check blood sugar 3 times per day 07/07/21   Lacinda Axon, MD  Insulin Pen Needle 31G X 5 MM MISC USE TO INJECT INSULIN INTO THE SKIN 1 TIME DAILY AT BEDTIME 07/07/21 07/07/22  Lacinda Axon, MD  Insulin Syringe-Needle U-100 31G X 15/64" 0.3 ML MISC  Use to inject insulin one time daily 08/13/17   Annia Belt, MD  methocarbamol (ROBAXIN) 750 MG tablet Take 750 mg by mouth daily as needed for muscle spasms. Take 2 tablets by mouth three times a day for 3 days and then take 1 tablet by mouth three times a day for 3 days    [provider]  naproxen (NAPROSYN) 500 MG tablet Take 500 mg by mouth 2 (two) times daily with a meal.    [provider]    Allergies    Bupropion, Jardiance [empagliflozin], Metronidazole, Shellfish allergy, and Shrimp extract allergy skin test  Review of Systems   Review of Systems  Constitutional:  Negative for chills and fever.  HENT:  Negative for ear pain and sore throat.   Eyes:  Negative for pain and visual disturbance.  Respiratory:  Negative for cough and shortness of breath.   Cardiovascular:  Negative for chest pain and palpitations.  Gastrointestinal:  Negative for abdominal pain and vomiting.  Genitourinary:  Positive for dysuria, frequency, hematuria and urgency. Negative for flank pain.  Musculoskeletal:  Negative for arthralgias and back pain.  Skin:  Negative for color change and rash.  Neurological:  Negative for seizures and syncope.  All other systems reviewed and are negative.  Physical Exam Updated Vital  Signs BP 119/75   Pulse 80   Temp 98.4 F (36.9 C) (Oral)   Resp 18   SpO2 99%   Physical Exam Vitals and nursing note reviewed.  Constitutional:      General: She is not in acute distress.    Appearance: Normal appearance. She is not toxic-appearing.  HENT:     Head: Normocephalic and atraumatic.  Eyes:     General: No scleral icterus. Cardiovascular:     Rate and Rhythm: Normal rate and regular rhythm.  Pulmonary:     Effort: Pulmonary effort is normal.     Breath sounds: Normal breath sounds.  Abdominal:     General: Abdomen is flat. Bowel sounds are normal.     Palpations: Abdomen is soft.     Tenderness: There is no abdominal tenderness. There is no right CVA tenderness, left CVA tenderness, guarding or rebound.  Musculoskeletal:        General: No deformity.     Cervical back: Normal range of motion.  Skin:    General: Skin is warm and dry.  Neurological:     General: No focal deficit present.     Mental Status: She is alert. Mental status is at baseline.    ED Results / Procedures / Treatments   Labs (all labs ordered are listed, but only abnormal results are displayed) Labs Reviewed  URINE CULTURE  URINALYSIS, ROUTINE W REFLEX MICROSCOPIC    EKG None  Radiology No results found.  Procedures Procedures   Medications Ordered in ED Medications - No data to display  ED Course  I have reviewed the triage vital signs and the nursing notes.  Pertinent labs & imaging results that were available during my care of the patient were reviewed by me and considered in my medical decision making (see chart for details).  Charity Tessier is a 65 y.o. female presents to the ED for urinary urgency, frequency, dysuria, pink-tinged urine with intermittent lower abdominal cramps gradually worsening since yesterday.  Differential includes UTI, kidney stone, bladder carcinoma, hematuria.  Urinalysis ordered.  3:42 PM Care of [name] transferred to [place]  at the end of my  shift as the patient will require reassessment once labs/imaging have resulted. Patient presentation, ED course, and plan of care discussed with review of all pertinent labs and imaging. Please see his/her note for further details regarding further ED course and disposition. Plan at time of handoff is order renal CT to r/o stone and discharge home. This may be altered or completely changed at the discretion of the oncoming team pending results of further workup.    Clinical Course as of 08/27/21 1537  Wed Aug 27, 2021  1527 Dysuria, No flank pain, no gross hematuria. Plan to dc home after UA [BH]    Clinical Course User Index [BH] Henderly, Britni A, PA-C   MDM Rules/Calculators/A&P  Final Clinical Impression(s) / ED Diagnoses Final diagnoses:  None    Rx / DC Orders ED Discharge Orders     None        Sherrell Puller, PA-C 08/27/21 1546    Valarie Merino, MD 08/29/21 [phone]

## 2021-08-27 NOTE — ED Notes (Signed)
An After Visit Summary was printed and given to the patient. Discharge instructions given and no further questions at this time.  

## 2021-08-27 NOTE — ED Triage Notes (Signed)
Pt arrived via POV, c/o dysuria and blood in urine.

## 2021-08-28 ENCOUNTER — Encounter: Payer: Medicare HMO | Admitting: Pharmacist

## 2021-08-28 ENCOUNTER — Other Ambulatory Visit: Payer: Self-pay

## 2021-08-28 ENCOUNTER — Ambulatory Visit (HOSPITAL_COMMUNITY)
Admission: EM | Admit: 2021-08-28 | Discharge: 2021-08-28 | Disposition: A | Discharge status: Home / Self Care | Payer: Medicare HMO

## 2021-08-28 ENCOUNTER — Other Ambulatory Visit (HOSPITAL_COMMUNITY): Payer: Self-pay

## 2021-08-28 MED ORDER — ONDANSETRON 4 MG PO TBDP
ORAL_TABLET | ORAL | 0 refills | Status: DC
  Filled 2021-08-28: qty 20, 6d supply, fill #0

## 2021-08-28 MED ORDER — CEPHALEXIN 500 MG PO CAPS
ORAL_CAPSULE | Freq: Two times a day (BID) | ORAL | 0 refills | Status: DC
  Filled 2021-08-28: qty 14, 7d supply, fill #0

## 2021-08-29 LAB — URINE CULTURE: Culture: >=100000 — AB

## 2021-08-31 ENCOUNTER — Telehealth: Payer: Self-pay | Admitting: Emergency Medicine

## 2021-08-31 NOTE — Telephone Encounter (Signed)
Post ED Visit - Positive Culture Follow-up  Culture report reviewed by antimicrobial stewardship pharmacist: [facility] Team '[]'$  Elenor Quinones, Pharm.D. '[]'$  Heide Guile, Pharm.D., BCPS AQ-ID '[]'$  Parks Neptune, Pharm.D., BCPS '[]'$  Alycia Rossetti, Pharm.D., BCPS '[]'$  [place], Pharm.D., BCPS, AAHIVP '[]'$  Legrand Como, Pharm.D., BCPS, AAHIVP '[]'$  Salome Arnt, PharmD, BCPS '[]'$  Johnnette Gourd, PharmD, BCPS '[]'$  Hughes Better, PharmD, BCPS '[]'$  Leeroy Cha, PharmD '[]'$  Laqueta Linden, PharmD, BCPS '[]'$  Albertina Parr, PharmD  [facility] Team '[]'$  Leodis Sias, PharmD '[]'$  Lindell Spar, PharmD '[]'$  Royetta Asal, PharmD '[]'$  Graylin Shiver, Rph '[]'$  Rema Fendt) Glennon Mac, PharmD '[]'$  Arlyn Dunning, PharmD '[]'$  Netta Cedars, PharmD '[]'$  Dia Sitter, PharmD '[]'$  Leone Haven, PharmD '[]'$  Gretta Arab, PharmD '[]'$  Theodis Shove, PharmD '[x]'$  Peggyann Juba, PharmD '[]'$  Reuel Boom, PharmD   Positive urine culture Treated with Cephalexin, organism sensitive to the same and no further patient follow-up is required at this time.  Sandi Raveling Jalin Erpelding 08/31/2021, 10:51 AM

## 2021-09-03 ENCOUNTER — Other Ambulatory Visit (HOSPITAL_COMMUNITY): Payer: Self-pay

## 2021-09-05 ENCOUNTER — Ambulatory Visit (INDEPENDENT_AMBULATORY_CARE_PROVIDER_SITE_OTHER): Payer: Medicare HMO | Admitting: *Deleted

## 2021-09-05 ENCOUNTER — Other Ambulatory Visit: Payer: Self-pay

## 2021-09-05 DIAGNOSIS — M2041 Other hammer toe(s) (acquired), right foot: Secondary | ICD-10-CM

## 2021-09-05 DIAGNOSIS — M2012 Hallux valgus (acquired), left foot: Secondary | ICD-10-CM | POA: Diagnosis not present

## 2021-09-05 DIAGNOSIS — E1142 Type 2 diabetes mellitus with diabetic polyneuropathy: Secondary | ICD-10-CM | POA: Diagnosis not present

## 2021-09-05 DIAGNOSIS — M2042 Other hammer toe(s) (acquired), left foot: Secondary | ICD-10-CM | POA: Diagnosis not present

## 2021-09-05 DIAGNOSIS — M2011 Hallux valgus (acquired), right foot: Secondary | ICD-10-CM

## 2021-09-05 DIAGNOSIS — B351 Tinea unguium: Secondary | ICD-10-CM

## 2021-09-05 DIAGNOSIS — E1169 Type 2 diabetes mellitus with other specified complication: Secondary | ICD-10-CM

## 2021-09-05 NOTE — Progress Notes (Signed)
Patient presents today to pick up diabetic shoes and insoles.  Patient was dispensed 1 pair of diabetic shoes and 3 pairs of foam casted diabetic insoles. Fit was NOT satisfactory.   She felt the shoes were too bulky and felt like when she was walking around, she was a little unstable.  We have some shoes in the office that were not able to be sent back and I did have a NB Men's 8.5 Wide (white sneaker) that she was able to try on. She liked the fit and said they felt very comfortable.   Instructions for break-in and wear was reviewed and a copy was given to the patient.   Patient did take these shoes with her today.  We will send back her original order: Apex X821 Women's 10 Wide   Re-appointment for regularly scheduled diabetic foot care visits or if they should experience any trouble with the shoes or insoles.

## 2021-09-08 ENCOUNTER — Telehealth: Payer: Self-pay | Admitting: Podiatry

## 2021-09-08 ENCOUNTER — Telehealth: Payer: Self-pay

## 2021-09-08 NOTE — Telephone Encounter (Signed)
Pt left message stating she picked up her diabetic shoes and is having an issue with them..  I returned call and left message for pt to call to schedule an appt to see what we need to get them to work for her. We have availability tomorrow 9.27 or 9.28

## 2021-09-08 NOTE — Telephone Encounter (Signed)
Received TC from patient who states she needs a refill on Jardiance, but she also developed a UTI per [facility] and was treated with Cephalexin start date 9/15.  She is unsure if the Vania Rea is contributing to the UTI, states she is not sure if the UTI has cleared up, still experiencing some "itchiness and discharge", denies dysuria, hematuria, no frequency. F/U appt made for tomorrow morning at 0945 w/ red team/ Dr. Gilford Rile to discuss.  Pt informed MD will decide at f/u about Jardiance refill. SChaplin, RN,BSN

## 2021-09-09 ENCOUNTER — Other Ambulatory Visit (HOSPITAL_COMMUNITY): Payer: Self-pay

## 2021-09-09 ENCOUNTER — Ambulatory Visit (INDEPENDENT_AMBULATORY_CARE_PROVIDER_SITE_OTHER): Payer: Medicare HMO | Admitting: Internal Medicine

## 2021-09-09 ENCOUNTER — Encounter: Payer: Self-pay | Admitting: Internal Medicine

## 2021-09-09 ENCOUNTER — Other Ambulatory Visit: Payer: Self-pay

## 2021-09-09 ENCOUNTER — Encounter: Payer: Self-pay | Admitting: Dietician

## 2021-09-09 ENCOUNTER — Ambulatory Visit: Payer: Medicare HMO | Admitting: Dietician

## 2021-09-09 VITALS — BP 155/71 | HR 63 | Temp 98.5°F | Resp 32 | Ht 65.0 in | Wt 137.7 lb

## 2021-09-09 DIAGNOSIS — E1169 Type 2 diabetes mellitus with other specified complication: Secondary | ICD-10-CM | POA: Diagnosis not present

## 2021-09-09 DIAGNOSIS — E785 Hyperlipidemia, unspecified: Secondary | ICD-10-CM

## 2021-09-09 DIAGNOSIS — IMO0002 Reserved for concepts with insufficient information to code with codable children: Secondary | ICD-10-CM

## 2021-09-09 DIAGNOSIS — B379 Candidiasis, unspecified: Secondary | ICD-10-CM | POA: Diagnosis not present

## 2021-09-09 DIAGNOSIS — N390 Urinary tract infection, site not specified: Secondary | ICD-10-CM

## 2021-09-09 DIAGNOSIS — Z794 Long term (current) use of insulin: Secondary | ICD-10-CM

## 2021-09-09 DIAGNOSIS — E1165 Type 2 diabetes mellitus with hyperglycemia: Secondary | ICD-10-CM

## 2021-09-09 DIAGNOSIS — I10 Essential (primary) hypertension: Secondary | ICD-10-CM

## 2021-09-09 LAB — POCT URINALYSIS DIPSTICK
Bilirubin, UA: NEGATIVE
Blood, UA: NEGATIVE
Glucose, UA: POSITIVE — AB
Ketones, UA: NEGATIVE
Leukocytes, UA: NEGATIVE
Nitrite, UA: NEGATIVE
Protein, UA: NEGATIVE
Spec Grav, UA: 1.025 (ref 1.010–1.025)
Urobilinogen, UA: 0.2 E.U./dL
pH, UA: 6.5 (ref 5.0–8.0)

## 2021-09-09 MED ORDER — METFORMIN HCL ER 500 MG PO TB24
1000.0000 mg | ORAL_TABLET | Freq: Every day | ORAL | 11 refills | Status: DC
  Filled 2021-09-09 – 2022-01-09 (×2): qty 60, 30d supply, fill #0

## 2021-09-09 MED ORDER — AMLODIPINE BESYLATE 10 MG PO TABS
10.0000 mg | ORAL_TABLET | Freq: Every day | ORAL | 3 refills | Status: DC
  Filled 2021-09-09: qty 60, 60d supply, fill #0
  Filled 2021-11-17: qty 60, 60d supply, fill #1

## 2021-09-09 MED ORDER — FLUCONAZOLE 150 MG PO TABS
150.0000 mg | ORAL_TABLET | Freq: Once | ORAL | 0 refills | Status: AC
  Filled 2021-09-09: qty 1, 1d supply, fill #0

## 2021-09-09 MED ORDER — ATORVASTATIN CALCIUM 40 MG PO TABS
40.0000 mg | ORAL_TABLET | Freq: Every day | ORAL | 3 refills | Status: DC
  Filled 2021-09-09: qty 90, 90d supply, fill #0

## 2021-09-09 NOTE — Assessment & Plan Note (Signed)
Refilled atorvastatin

## 2021-09-09 NOTE — Patient Instructions (Signed)
Please record the time, amount and what food drinks and activities you have while wearing the continuous glucose monitor (CGM).  Bring the folder with you to follow up appointments. If your monitor falls off, please place it in the bag provided in your folder and bring it back with you to your next appointment.   Do not have a CT or an MRI while wearing the CGM.   1 week visit has been set up with me and a doctor for the first of two CGM downloads.   You will also return in 2 weeks to have your second download and the CGM removed.  Debera Lat, RD 09/09/2021 10:07 AM

## 2021-09-09 NOTE — Patient Instructions (Signed)
To Ms. Emmerich,   It was a pleasure seeing you today. Today we discussed your diabetes medications, urinary tract infection, and medication refills.   For your diabetes, we will stop the Jardiance today. I will also place an order to combine your metformin into 1 pill to take during the morning. Today we will place a glucose monitor, and will evaluate your sugars on your insulin and metformin. We will see you your sugars are doing next week on your follow up and make adjustments as needed.   For your UTI, I am glad that you finished your antibiotic. We are going to give you a medication called Diflucan (1 pill) that you should take to help clear the yeast infection.   For your medication refills, I have reordered your amlodipine and atorvastatin.  We will see you next week. Maudie Mercury, MD  Please return in 7 days with [place] for Download #1.

## 2021-09-09 NOTE — Progress Notes (Signed)
Documentation for Freestyle Libre Pro Continuous glucose monitoring Freestyle Libre Pro CGM sensor placed today. Patient was educated about wearing sensor, keeping food, activity and medication log and when to call office. Patient was educated about how to care for the sensor and not to have an MRI, CT or Diathermy while wearing the sensor. Follow up was arranged with the patient for 1 week.   Lot #: [id number] Serial #: [id number] Expiration Date: 12/13/2021  Debera Lat, RD 09/09/2021 10:06 AM.

## 2021-09-09 NOTE — Assessment & Plan Note (Addendum)
Patient subsequently completed antibiotic course for recent UTI.  She states that she is still having some irritation, and having whitish discharge at today's visit. Assessment: Patient presents for follow-up appointment after UTI likely secondary to her Jardiance.  She has completed course of antibiotics, but since her ED visit she has noticed a white discharge, with pruritus. Urinalysis today shows greater than 1000 glucose.  Given her signs and symptoms, will empirically treat for yeast infection.  We will also collect urinalysis and culture to ensure she does not have a repeat UTI. - Diflucan 150 mg once - Obtain urinalysis and culture

## 2021-09-09 NOTE — Assessment & Plan Note (Addendum)
Patient presents to the clinic to discuss her current diabetic regimen. She states that she is currently taking glargine 30 units nightly, metformin 500 mg twice daily, and Jardiance 10 mg daily.  Patient comes with inquiries whether or not Jardiance was the cause of her UTI.  She states that since starting the medication she has felt some symptoms of nausea, increased urination, and was just seen and treated for a UTI for which she completed full course of Keflex.  We discussed the mechanism of action of Jardiance, and that it can increase the risk of UTI especially in patients with hemoglobin A1c greater than 10.  Patient states that she has since stopped the medication, as she suspected this was the case.  Patient is quite candid today, and states that sometimes it is hard for her to remember to take the metformin 500 twice daily, but usually takes the morning dose. She states that sometimes she does feel fatigued, and when this is the case she will typically not take her nightly insulin dose as well.  Assessment: Patient presents for a follow-up appointment after her recent ED visit.  We discussed the UTI in the setting of her Jardiance, and will stop Jardiance today given her side effects.  We did discuss that we could possibly revisit Jardiance at a later time when her A1c is better under control.  Patient voices understanding.  Patient also does endorse noncompliance with her medications, she does have difficulty remembering her metformin twice daily, and sometimes will not take her Lantus 30 units nightly.  We did discuss CGM today, which she is interested in trying.  Additionally discussed that in order to get maximum benefit, she should be compliant with her medications with which she agrees.  For ease of compliance we will consolidate metformin to 1000 mg XR.  Will receive CGM today and follow-up in 1 week. - Metformin XR 1000 mg daily - Continue glargine 30 units nightly - CGM fitted for today,  follow-up in next week - Discontinue Jardiance in the setting of side effects

## 2021-09-09 NOTE — Progress Notes (Signed)
   CC: Medication reconciliation/side effects, medication refills, UTI  HPI:  Ms.Gissell Lady is a 65 y.o. person, with a PMH noted below, who presents to the clinic to discuss her Vania Rea and recent ED visit for UTI. To see the management of their acute and chronic conditions, please see the A&P note under the Encounters tab.   Past Medical History:  Diagnosis Date   Allergy    Anxiety    occasional   Arthritis    Atherosclerotic peripheral vascular disease with intermittent claudication ([place])    s/p fem-fem bypass 2011. ABI 07/2012 0.5 R 0.65 L   Diabetes mellitus without complication (HCC)    GERD (gastroesophageal reflux disease)    Hyperlipidemia    Hypertension    IDDM (insulin dependent diabetes mellitus) 07/21/2007   July 2011: Femoral-femoral bypass  ABI 08/11/2012 notable for 0.4-0.59 on right ankle suggestive of severe arterial occlusive disease at rest and 0.60-0.7 on left ankle suggestive of moderate arterial occlusive disease at rest.    Iron deficiency anemia 05/08/2017   Low back pain 12/18/2013   Personal history of colonic polyps - adenomas 03/07/2014   Rotator cuff impingement syndrome of left shoulder 03/23/2016   Tobacco abuse    Review of Systems:   Review of Systems  Constitutional:  Negative for chills, fever, malaise/fatigue and weight loss.  Gastrointestinal:  Negative for abdominal pain, diarrhea, nausea and vomiting.  Genitourinary:  Positive for dysuria and frequency. Negative for flank pain and hematuria.  Neurological:  Negative for dizziness and headaches.    Physical Exam:  Vitals:   09/09/21 0956  BP: (!) 155/71  Pulse: 63  Resp: (!) 32  Temp: 98.5 F (36.9 C)  TempSrc: Oral  SpO2: 100%  Weight: 137 lb 11.2 oz (62.5 kg)  Height: 5\' 5"  (1.651 m)   Physical Exam Constitutional:      General: She is not in acute distress.    Appearance: Normal appearance. She is not ill-appearing, toxic-appearing or diaphoretic.  HENT:     Head:  Normocephalic and atraumatic.  Cardiovascular:     Rate and Rhythm: Normal rate and regular rhythm.  Pulmonary:     Effort: Pulmonary effort is normal.     Breath sounds: Normal breath sounds.  Musculoskeletal:     Right lower leg: No edema.     Left lower leg: No edema.  Skin:    General: Skin is warm and dry.  Neurological:     Mental Status: She is alert and oriented to person, place, and time.  Psychiatric:        Behavior: Behavior normal.     Assessment & Plan:   See Encounters Tab for problem based charting.  Patient discussed with Dr. Heber [place]

## 2021-09-10 LAB — URINALYSIS, ROUTINE W REFLEX MICROSCOPIC
Bilirubin, UA: NEGATIVE
Ketones, UA: NEGATIVE
Leukocytes,UA: NEGATIVE
Nitrite, UA: NEGATIVE
RBC, UA: NEGATIVE
Specific Gravity, UA: 1.02 (ref 1.005–1.030)
Urobilinogen, Ur: 0.2 mg/dL (ref 0.2–1.0)
pH, UA: 6.5 (ref 5.0–7.5)

## 2021-09-12 LAB — URINE CULTURE

## 2021-09-15 ENCOUNTER — Encounter: Payer: Medicare HMO | Admitting: Student

## 2021-09-16 ENCOUNTER — Ambulatory Visit (INDEPENDENT_AMBULATORY_CARE_PROVIDER_SITE_OTHER): Payer: Medicare HMO | Admitting: Student

## 2021-09-16 ENCOUNTER — Encounter: Payer: Medicare HMO | Admitting: Dietician

## 2021-09-16 ENCOUNTER — Encounter: Payer: Self-pay | Admitting: Student

## 2021-09-16 ENCOUNTER — Ambulatory Visit (INDEPENDENT_AMBULATORY_CARE_PROVIDER_SITE_OTHER): Payer: Medicare HMO | Admitting: Dietician

## 2021-09-16 ENCOUNTER — Encounter: Payer: Self-pay | Admitting: Dietician

## 2021-09-16 ENCOUNTER — Other Ambulatory Visit (HOSPITAL_COMMUNITY): Payer: Self-pay

## 2021-09-16 VITALS — BP 152/73 | HR 70 | Temp 98.7°F | Ht 65.0 in | Wt 138.7 lb

## 2021-09-16 DIAGNOSIS — E1169 Type 2 diabetes mellitus with other specified complication: Secondary | ICD-10-CM

## 2021-09-16 DIAGNOSIS — M722 Plantar fascial fibromatosis: Secondary | ICD-10-CM | POA: Diagnosis not present

## 2021-09-16 DIAGNOSIS — Z794 Long term (current) use of insulin: Secondary | ICD-10-CM

## 2021-09-16 DIAGNOSIS — E1165 Type 2 diabetes mellitus with hyperglycemia: Secondary | ICD-10-CM | POA: Diagnosis not present

## 2021-09-16 DIAGNOSIS — Z713 Dietary counseling and surveillance: Secondary | ICD-10-CM

## 2021-09-16 MED ORDER — TRULICITY 0.75 MG/0.5ML ~~LOC~~ SOAJ
0.7500 mg | SUBCUTANEOUS | 1 refills | Status: DC
  Filled 2021-09-16: qty 2, 28d supply, fill #0

## 2021-09-16 MED ORDER — INSULIN DEGLUDEC 100 UNIT/ML ~~LOC~~ SOPN
26.0000 [IU] | PEN_INJECTOR | Freq: Every day | SUBCUTANEOUS | 2 refills | Status: DC
  Filled 2021-09-16: qty 9, 34d supply, fill #0
  Filled 2021-10-21: qty 9, 34d supply, fill #1

## 2021-09-16 MED ORDER — INSULIN DEGLUDEC 100 UNIT/ML ~~LOC~~ SOPN: 26.0000 [IU] | PEN_INJECTOR | Freq: Every day | SUBCUTANEOUS | Status: DC

## 2021-09-16 NOTE — Patient Instructions (Signed)
Thank you for your visit today!   Please make a follow up with Butch Penny in 1 week to download your sensor. We will also remove it at the same visit.   Thank you, [name] for allowing [place] to provide your care today. Today, we discussed your diabetes and left ankle.  We are changing you insulin and starting you on a new diabetes medication to help improve your blood sugars.  I have ordered the following medication/changed the following medications:  Discontinue Lantus Start taking Tresiba 26 units at night. Start taking Trulicity once weekly  My Chart Access: [email or web address]  Please follow-up in 1 week  Please make sure to arrive 15 minutes prior to your next appointment. If you arrive late, you may be asked to reschedule.    We look forward to seeing you next time. Please call our clinic at [phone] if you have any questions or concerns. The best time to call is Monday-Friday from 9am-4pm, but there is someone available 24/7. If after hours or the weekend, call the main hospital number and ask for the Internal Medicine Resident On-Call. If you need medication refills, please notify your pharmacy one week in advance and they will send [place] a request.   Thank you for letting us take part in your care. Wishing you the best!  Lacinda Axon, MD 09/16/2021, 4:52 PM IM Resident, PGY-2 [facility] 41:10

## 2021-09-16 NOTE — Progress Notes (Signed)
   CC: Diabetes follow up  HPI:  [name] is a 65 y.o. female with PMH as below who presents to the clinic for evaluation of her chronic medical problems. Please see problem based charting for evaluation, assessment and plan.  Past Medical History:  Diagnosis Date   Allergy    Anxiety    occasional   Arthritis    Atherosclerotic peripheral vascular disease with intermittent claudication ([place])    s/p fem-fem bypass 2011. ABI 07/2012 0.5 R 0.65 L   Diabetes mellitus without complication (HCC)    GERD (gastroesophageal reflux disease)    Hyperlipidemia    Hypertension    IDDM (insulin dependent diabetes mellitus) 07/21/2007   July 2011: Femoral-femoral bypass  ABI 08/11/2012 notable for 0.4-0.59 on right ankle suggestive of severe arterial occlusive disease at rest and 0.60-0.7 on left ankle suggestive of moderate arterial occlusive disease at rest.    Iron deficiency anemia 05/08/2017   Low back pain 12/18/2013   Personal history of colonic polyps - adenomas 03/07/2014   Rotator cuff impingement syndrome of left shoulder 03/23/2016   Tobacco abuse     Review of Systems:  Constitutional: Negative for fever. Positive for fatigue Eyes: Negative for visual changes Respiratory: Negative for shortness of breath Cardiac: Negative for chest pain MSK: Negative for back pain. Positive for ankle soreness Abdomen: Negative for abdominal pain, constipation or diarrhea Neuro: Negative for headache or weakness  Physical Exam: General: Pleasant, well-dressed elderly woman. No acute distress. Cardiac: RRR. No murmurs, rubs or gallops. No LE edema Respiratory: Lungs CTAB. No wheezing or crackles. Abdominal: Soft, symmetric and non tender. Normal BS. Skin: Warm, dry and intact without rashes or lesions Extremities: Atraumatic. Full ROM of the bilateral ankles, no ttp of the medial and.lateral malleolus Palpable radial and DP pulses. Neuro: A&O x 3. Moves all extremities. Normal sensation.   Psych: Appropriate mood and affect.  Vitals:   09/16/21 1608  BP: (!) 152/73  Pulse: 70  Temp: 98.7 F (37.1 C)  TempSrc: Oral  SpO2: 100%  Weight: 138 lb 11.2 oz (62.9 kg)  Height: 5\' 5"  (1.651 m)    Assessment & Plan:   See Encounters Tab for problem based charting.  Patient discussed with Dr. Emelia Salisbury, MD, MPH

## 2021-09-17 ENCOUNTER — Encounter: Payer: Self-pay | Admitting: Dietician

## 2021-09-17 NOTE — Progress Notes (Signed)
Medical Nutrition Therapy:  Appt start time: 1520 end time:  1550 Total time:30 minutes Visit # 3  Assessment:  Primary concerns today: meal planning for her diabetes. . Ms. Israelson says she reads labels to limit her carbohydrates. She brought her meter today and we had 8 days CGM data. They both show hypoglycemia fasting with high variability, a spike midday and above target it the evening until midmorning and then dips down. We discussed difference between checking blood sugar with meter vs CGM. She will think about a trial of using a CGM.  Her weight is acceptable and she would like to weigh about 5# more. Discussed increasing blood sugars in target should achieve that for her.   Estimated body mass index is 22.93 kg/m as calculated from the following:   Height as of 09/09/21: 5\' 5"  (1.651 m).   Weight as of this encounter: 137 lb 12.8 oz (62.5 kg).   Wt Readings from Last 10 Encounters:  09/16/21 138 lb 11.2 oz (62.9 kg)  09/16/21 137 lb 12.8 oz (62.5 kg)  09/09/21 137 lb 11.2 oz (62.5 kg)  07/17/21 136 lb 4.8 oz (61.8 kg)  06/25/21 139 lb 3.2 oz (63.1 kg)  06/12/21 139 lb 6.4 oz (63.2 kg)  06/03/21 138 lb 14.4 oz (63 kg)  04/08/21 135 lb 12.8 oz (61.6 kg)  01/20/21 142 lb 12.8 oz (64.8 kg)  12/23/20 149 lb 9.6 oz (67.9 kg)    SLEEP:sometimes 12 am-10 am other nights 3 am to 10 am.   CGM Results from download: Professional   Average glucose:   228 mg/dL for 8 days  Glucose management indicator:   8.8 %  Time in range (70-180 mg/dL):   23 %   (Goal >70%)  Time High (181-250 mg/dL):   24 %   (Goal < 25%)  Time Very High (>250 mg/dL):    45 %   (Goal < 5%)  Time Low (54-69 mg/dL):   4 %   (Goal <4%)  Time Very Low (<54 mg/dL):   4 %   (Goal <1%)  Coefficient of variation:   42.4 %   (Goal <36%)   Lab Results  Component Value Date   HGBA1C 11.0 (A) 07/17/2021   HGBA1C 11.0 (A) 04/08/2021   HGBA1C 9.4 (A) 12/23/2020   HGBA1C 9.4 (A) 09/17/2020   HGBA1C 8.7 (A) 12/05/2019      MEDICATIONS: Lantus 30 units in PM, metformin 500 mg at noon and 6 PM DIETARY INTAKE:Usual eating pattern includes 2-3 meals and 0-1  snacks per day. Dining Out (times/week): a few times a week 24-hr recall:  Breakfast:12 PM 1 egg with corned beef hash and coffee or grits and egg Dinner:6 PM cooks for family a meat, vegetable and starch like chicken fajitas or black bean dish over rice but says she only eats small amounts Snack- 1-3 am some nights- sandwich Beverages: water, green tea w/ 1/2 -1 teaspoon sugar, ? red wine  Usual physical activity:housework, activities of daily living  Progress Towards Goal(s):  Some progress.   Nutritional Diagnosis:  NB-1.1 Food and nutrition-related knowledge deficit As related to lack of recent and sufficient diabetes meal planning training.  As evidenced by her report and questions.    Intervention:  Nutrition education encouraging balanced small meals and snacks that include vegetables, healthy fats and protein. Action Goal: small frequent meals unless put on prandial insulin  Outcome goal: improved self confidence in diabetes meal planning Coordination of care: discussed care and  diabetes medicine changes with doctor Amponsah   Teaching Method Utilized: Visual, Auditory,Hands on Handouts given during visit include:recipes and  After visit summary Barriers to learning/adherence to lifestyle change: lack of material resources , depression lack of support Demonstrated degree of understanding via:  Teach Back   Monitoring/Evaluation:  Dietary intake, exercise, meter, and body weight in 1 week(s) for final download and removal of professional CGM sensor.  Debera Lat, RD 09/17/2021 9:29 AM. .

## 2021-09-18 ENCOUNTER — Encounter: Payer: Self-pay | Admitting: Student

## 2021-09-18 NOTE — Assessment & Plan Note (Signed)
Patient with a history of plantar fasciitis reports some soreness around her foot and ankles, worse on the right. Started a few days ago but denies any feet trauma. States the pain is mild at the moment.  Plan: -- Conservative management for now with OTC NSAIDs and foot stretches -- 1 week follow-up for reevaluation

## 2021-09-18 NOTE — Assessment & Plan Note (Addendum)
Patient with uncontrolled diabetes here for diabetes follow-up with PCP and Butch Penny after well in CGM for 1 week.  Dasha Frickey wore the CGM for 8 days. The average reading was 228, % time in target was 23%, time below target was 8, and % time above target was 45. Intervention will be to discontinue glargine 30 units nightly, start Tresiba 22 units nightly and Trulicity 9.51 mg weekly. The patient will be scheduled to see Butch Penny and PCP in 1 week for a final appointment.   Patient's blood sugar remains uncontrolled due to inconsistent use of her insulin. Patient reported fatigue occasionally and loss of motivation to take care medications including her injections. States she is working on being more consistent and often had to mentally prepare herself to take her medications daily. Patient was counseled that her depression is contributing to her compliance with her medication was advised to continue to work with Dr. Theodis Shove to help identify the stressors that are contributing to her mood. She was also informed that hypoglycemia often can make you fatigued and taking her insulin consistently will help improve her blood sugar and reduce her fatigue.  Patient was recently taken off Jardiance due to recurrent UTIs. Patient expressed understanding and will try to be more compliant with the plan. A1c remains at 11 two months ago.  Plan: -- Discontinue glargine 30 units nightly -- Start Tresiba 26 units nightly -- Start Trulicity 8.84 mg once a week -- Continue metformin 1000 mg XR daily -- 1 week follow-up with Butch Penny for [facility] download and adjustment of therapy

## 2021-09-18 NOTE — Progress Notes (Signed)
Internal Medicine Clinic Attending  Case discussed with Dr. Amponsah  At the time of the visit.  We reviewed the resident's history and exam and pertinent patient test results.  I agree with the assessment, diagnosis, and plan of care documented in the resident's note.  

## 2021-09-25 ENCOUNTER — Other Ambulatory Visit: Payer: Self-pay

## 2021-09-25 ENCOUNTER — Ambulatory Visit
Admission: RE | Admit: 2021-09-25 | Discharge: 2021-09-25 | Disposition: A | Discharge status: Home / Self Care | Payer: Medicare HMO | Source: Ambulatory Visit | Attending: Internal Medicine | Admitting: Internal Medicine

## 2021-09-25 DIAGNOSIS — Z1231 Encounter for screening mammogram for malignant neoplasm of breast: Secondary | ICD-10-CM

## 2021-10-14 ENCOUNTER — Ambulatory Visit (INDEPENDENT_AMBULATORY_CARE_PROVIDER_SITE_OTHER): Payer: Medicare HMO | Admitting: Podiatry

## 2021-10-14 ENCOUNTER — Other Ambulatory Visit: Payer: Self-pay

## 2021-10-14 ENCOUNTER — Encounter: Payer: Self-pay | Admitting: Podiatry

## 2021-10-14 DIAGNOSIS — E1169 Type 2 diabetes mellitus with other specified complication: Secondary | ICD-10-CM

## 2021-10-14 DIAGNOSIS — Z794 Long term (current) use of insulin: Secondary | ICD-10-CM

## 2021-10-14 DIAGNOSIS — B351 Tinea unguium: Secondary | ICD-10-CM

## 2021-10-14 DIAGNOSIS — E1142 Type 2 diabetes mellitus with diabetic polyneuropathy: Secondary | ICD-10-CM

## 2021-10-16 ENCOUNTER — Ambulatory Visit (INDEPENDENT_AMBULATORY_CARE_PROVIDER_SITE_OTHER): Payer: Medicare HMO | Admitting: Podiatrist

## 2021-10-16 ENCOUNTER — Encounter: Payer: Self-pay | Admitting: Podiatrist

## 2021-10-16 ENCOUNTER — Other Ambulatory Visit: Payer: Self-pay

## 2021-10-16 DIAGNOSIS — M2042 Other hammer toe(s) (acquired), left foot: Secondary | ICD-10-CM

## 2021-10-16 DIAGNOSIS — M2011 Hallux valgus (acquired), right foot: Secondary | ICD-10-CM

## 2021-10-16 DIAGNOSIS — M2012 Hallux valgus (acquired), left foot: Secondary | ICD-10-CM

## 2021-10-16 DIAGNOSIS — M2041 Other hammer toe(s) (acquired), right foot: Secondary | ICD-10-CM

## 2021-10-16 NOTE — Progress Notes (Signed)
Patient presents today to be measured for her correct shoe size. She was measured with the brannock device to be a little over 10.5 medium  I recommended she get size 11 wide to accomidate for swelling   Her last pair of shoes were small so she returned them.  Today, we ordered her a size 11 wide- apex [id number] lace walker  to replace those shoes that were returned,.

## 2021-10-17 ENCOUNTER — Other Ambulatory Visit: Payer: Self-pay

## 2021-10-17 NOTE — Telephone Encounter (Signed)
Pt needs refill on trulicity  Please confirm dose Pt also states she has a sensor on her arm that needs to be removed.  Will have front office schedule appt for next week.

## 2021-10-17 NOTE — Progress Notes (Signed)
  Subjective:  Patient ID: Angie Cain, female    DOB: 04-05-1956,  MRN: [id number]  Airyana Sprunger presents to clinic today for at risk foot care with history of diabetic neuropathy and painful thick toenails that are difficult to trim. Pain interferes with ambulation. Aggravating factors include wearing enclosed shoe gear. Pain is relieved with periodic professional debridement.  Patient states blood glucose was 79 mg/dl today.   Patient has brought back her new diabetic shoes and states she hasn't worn them more than 4 hours in her home due to discomfort. They are too tight around her bunion area.  PCP is Lacinda Axon, MD , and last visit was 09/16/2021 .  Allergies  Allergen Reactions   Bupropion Hives and Swelling   Jardiance [Empagliflozin] Other (See Comments)    Caused a UTI   Metronidazole Itching   Shellfish Allergy Itching and Other (See Comments)    Mouth irritation/itching   Shrimp Extract Allergy Skin Test Itching    Review of Systems: Negative except as noted in the HPI. Objective:   Constitutional Karne Ozga is a pleasant 65 y.o. African American female, WD, WN in NAD. AAO x 3.   Vascular CFT immediate b/l LE. Palpable DP/PT pulses b/l LE. Digital hair sparse b/l. Skin temperature gradient WNL b/l. No pain with calf compression b/l. No edema noted b/l. Lower extremity skin temperature gradient within normal limits. No cyanosis or clubbing noted.  Neurologic Normal speech. Oriented to person, place, and time. Protective sensation intact 5/5 intact bilaterally with 10g monofilament b/l. Vibratory sensation intact b/l.  Dermatologic Pedal integument with normal turgor, texture and tone b/l LE. No open wounds b/l. No interdigital macerations b/l. Toenails 1-5 b/l elongated, thickened, discolored with subungual debris. +Tenderness with dorsal palpation of nailplates. No hyperkeratotic or porokeratotic lesions present. Toenails 1-5 b/l elongated, discolored,  dystrophic, thickened, crumbly with subungual debris and tenderness to dorsal palpation.  Orthopedic: Normal muscle strength 5/5 to all lower extremity muscle groups bilaterally. HAV with bunion deformity noted b/l LE. Hammertoe deformity noted 2-5 b/l. Diabetic shoes are noted to be too narrow for her bunion deformity.   Radiographs: None Assessment:   1. Onychomycosis of multiple toenails with type 2 diabetes mellitus and peripheral neuropathy ([place])   2. Type 2 diabetes mellitus with diabetic polyneuropathy, with long-term current use of insulin ([place])    Plan:  Patient was evaluated and treated and all questions answered. Consent given for treatment as described below: -No new findings. No new orders. -Her DM are noted to be too narrow and do not accommodate her bunion deformity adequately. We will return shoes and have her remeasured. -Continue diabetic foot care principles: inspect feet daily, monitor glucose as recommended by PCP and/or Endocrinologist, and follow prescribed diet per PCP, Endocrinologist and/or dietician. -Patient/POA to call should there be question/concern in the interim.  Return in about 3 months (around 01/14/2022).  Marzetta Board, DPM

## 2021-10-17 NOTE — Telephone Encounter (Signed)
Requesting to speak with a nurse about insulin, please call pt back.

## 2021-10-19 MED ORDER — TRULICITY 0.75 MG/0.5ML ~~LOC~~ SOAJ
0.7500 mg | SUBCUTANEOUS | 2 refills | Status: DC
  Filled 2021-10-19: qty 2, 28d supply, fill #0

## 2021-10-20 ENCOUNTER — Other Ambulatory Visit (HOSPITAL_COMMUNITY): Payer: Self-pay

## 2021-10-21 ENCOUNTER — Encounter: Payer: Self-pay | Admitting: Student

## 2021-10-21 ENCOUNTER — Other Ambulatory Visit (HOSPITAL_COMMUNITY): Payer: Self-pay

## 2021-10-21 ENCOUNTER — Ambulatory Visit (INDEPENDENT_AMBULATORY_CARE_PROVIDER_SITE_OTHER): Payer: Medicare HMO | Admitting: Student

## 2021-10-21 ENCOUNTER — Other Ambulatory Visit: Payer: Self-pay

## 2021-10-21 VITALS — BP 114/78 | HR 76 | Temp 98.1°F | Resp 24 | Ht 65.0 in | Wt 136.0 lb

## 2021-10-21 DIAGNOSIS — Z794 Long term (current) use of insulin: Secondary | ICD-10-CM | POA: Diagnosis not present

## 2021-10-21 DIAGNOSIS — E1165 Type 2 diabetes mellitus with hyperglycemia: Secondary | ICD-10-CM

## 2021-10-21 LAB — GLUCOSE, CAPILLARY: Glucose-Capillary: 169 mg/dL — ABNORMAL HIGH (ref 70–99)

## 2021-10-21 LAB — POCT GLYCOSYLATED HEMOGLOBIN (HGB A1C): Hemoglobin A1C: 7.4 % — AB (ref 4.0–5.6)

## 2021-10-21 MED ORDER — INSULIN DEGLUDEC 100 UNIT/ML ~~LOC~~ SOPN
24.0000 [IU] | PEN_INJECTOR | Freq: Every day | SUBCUTANEOUS | 2 refills | Status: DC
  Filled 2021-10-21 – 2022-02-10 (×3): qty 15, 62d supply, fill #0

## 2021-10-21 MED ORDER — INSULIN DEGLUDEC 100 UNIT/ML ~~LOC~~ SOPN
26.0000 [IU] | PEN_INJECTOR | Freq: Every day | SUBCUTANEOUS | 2 refills | Status: DC
  Filled 2021-10-21: qty 15, 57d supply, fill #0

## 2021-10-21 MED ORDER — TRULICITY 0.75 MG/0.5ML ~~LOC~~ SOAJ
0.7500 mg | SUBCUTANEOUS | 3 refills | Status: DC
  Filled 2021-10-21 – 2021-11-17 (×2): qty 6, 84d supply, fill #0

## 2021-10-21 NOTE — Patient Instructions (Signed)
[name], it was a pleasure seeing you today!  Today we discussed: - Diabetes: [name], I would like for you to decrease your long-acting insulin (TRESIBA) to 24 units nightly. Otherwise, you can continue with your current regimen.  I have ordered the following labs today:   Lab Orders         Glucose, capillary         POC Hbg A1C        Follow-up: 3 months   Please make sure to arrive 15 minutes prior to your next appointment. If you arrive late, you may be asked to reschedule.   We look forward to seeing you next time. Please call our clinic at [phone] if you have any questions or concerns. The best time to call is Monday-Friday from 9am-4pm, but there is someone available 24/7. If after hours or the weekend, call the main hospital number and ask for the Internal Medicine Resident On-Call. If you need medication refills, please notify your pharmacy one week in advance and they will send [place] a request.  Thank you for letting us take part in your care. Wishing you the best!  Thank you, Sanjuan Dame, MD

## 2021-10-21 NOTE — Progress Notes (Signed)
   CC: diabetes follow-up  HPI:  Angie Cain is a 65 y.o. with insulin-dependent type 2 diabetes mellitus, hyperlipidemia, hypertension, tobacco use disorder presenting to [facility] for diabetes follow-up appointment.  Please see problem-based list for further details.  Past Medical History:  Diagnosis Date   Allergy    Anxiety    occasional   Arthritis    Atherosclerotic peripheral vascular disease with intermittent claudication ([place])    s/p fem-fem bypass 2011. ABI 07/2012 0.5 R 0.65 L   Diabetes mellitus without complication (HCC)    GERD (gastroesophageal reflux disease)    Hyperlipidemia    Hypertension    IDDM (insulin dependent diabetes mellitus) 07/21/2007   July 2011: Femoral-femoral bypass  ABI 08/11/2012 notable for 0.4-0.59 on right ankle suggestive of severe arterial occlusive disease at rest and 0.60-0.7 on left ankle suggestive of moderate arterial occlusive disease at rest.    Iron deficiency anemia 05/08/2017   Low back pain 12/18/2013   Personal history of colonic polyps - adenomas 03/07/2014   Rotator cuff impingement syndrome of left shoulder 03/23/2016   Tobacco abuse    Review of Systems:  As per HPI  Physical Exam:  Vitals:   10/21/21 1024  BP: 114/78  Pulse: 76  Resp: (!) 24  Temp: 98.1 F (36.7 C)  TempSrc: Oral  SpO2: 100%  Weight: 136 lb (61.7 kg)  Height: 5\' 5"  (1.651 m)   General: Resting comfortably in chair, no acute distress CV: Regular rate, rhythm. No murmurs appreciated. Distal pulses 2+ bilaterally. Pulm: Normal work of breathing on room air. Clear to auscultation bilaterally. MSK: Normal bulk, tone. No pitting edema bilaterally. Neuro: Awake, alert, conversing appropriately.  Assessment & Plan:   See Encounters Tab for problem based charting.  Patient discussed with Dr. Evette Doffing

## 2021-10-21 NOTE — Assessment & Plan Note (Addendum)
Patient presenting for diabetes check after being seen one month ago. She was recently given CGM, but unfortunately we were unable to download those values today. She mentions that the majority of her sugar readings have been <200. She does note hypoglycemic episodes in the morning w/ CBG's in 50-60's. States this does not occur every day, but roughly a few times monthly. During these episodes, she feels a cold sweat and drinks some juice. This usually resolves the symptoms quickly. Otherwise, she denies any polyuria, polydipsia, blurry vision, or neuropathy. Of note, patient reports she has cut out all sodas and has been watching her diet closely.  A1c today 7.4%. Congratulated Ms. Huynh on her success. Unusual hypoglycemic episodes given ultra-long acting insulin. Less concerning for other etiology such as pancreatic insufficiency/insulinoma given these episodes do not consistently occur. We will mildly decrease her long-acting insulin and have her follow-up back in clinic in three months.  - Decrease Tresiba 24 units nightly - Continue Trulicity 0.75mg  weekly - Follow-up in three months for A1c

## 2021-10-22 ENCOUNTER — Telehealth: Payer: Self-pay | Admitting: Podiatry

## 2021-10-22 NOTE — Progress Notes (Signed)
Internal Medicine Clinic Attending ? ?Case discussed with Dr. Braswell  At the time of the visit.  We reviewed the resident?s history and exam and pertinent patient test results.  I agree with the assessment, diagnosis, and plan of care documented in the resident?s note.  ?

## 2021-10-22 NOTE — Telephone Encounter (Signed)
Reordered diabetic shoes in... lvm for pt to call to schedule an appt to pick them up. 

## 2021-10-28 ENCOUNTER — Ambulatory Visit (INDEPENDENT_AMBULATORY_CARE_PROVIDER_SITE_OTHER): Payer: Medicare HMO | Admitting: Podiatrist

## 2021-10-28 ENCOUNTER — Other Ambulatory Visit: Payer: Self-pay

## 2021-10-28 DIAGNOSIS — E1169 Type 2 diabetes mellitus with other specified complication: Secondary | ICD-10-CM

## 2021-10-28 DIAGNOSIS — E1142 Type 2 diabetes mellitus with diabetic polyneuropathy: Secondary | ICD-10-CM

## 2021-10-28 DIAGNOSIS — B351 Tinea unguium: Secondary | ICD-10-CM

## 2021-10-28 DIAGNOSIS — M2011 Hallux valgus (acquired), right foot: Secondary | ICD-10-CM

## 2021-10-28 DIAGNOSIS — M2012 Hallux valgus (acquired), left foot: Secondary | ICD-10-CM

## 2021-10-28 NOTE — Progress Notes (Signed)
   The patient presented to the office to day to pick up diabetic shoes -- she brought in the 3 pr diabetic custom inserts she tried with the previous shoes she had to return.  1 pr of the inserts were put in the shoes and the shoes were fitted to the patient.   The foot ortheses offered full contact with plantar surface and contoured the arch well.   The shoes fit well with no heel slippage or areas of pressure concern.   Instructions for break in and wear were dispensed as well as instructions for changing out the diabetic insoles every 4 months.  The  delivery documentation and break in instruction forms were signed and a copy of the paperwork was given to the patient.    Patient advised to contact us if any problems arise.  Patient also advised on how to report any issues   Patient ended up liking the mens size 8.5 2e width by [employer]   (Apex 11 womens wide was too big)

## 2021-10-29 ENCOUNTER — Other Ambulatory Visit: Payer: Self-pay | Admitting: Internal Medicine

## 2021-11-17 ENCOUNTER — Other Ambulatory Visit (HOSPITAL_COMMUNITY): Payer: Self-pay

## 2021-11-17 ENCOUNTER — Telehealth: Payer: Self-pay

## 2021-11-17 NOTE — Telephone Encounter (Signed)
Talked to Pharmacy patient has refills on Amlodipine 10 mg, and Dulaglutide.  Angie Cain can be pick up on 11/19/2021.    Patient was called and informed of the above and will go to Pharamacy to pick up meds today and on 11/19/2021.

## 2021-11-17 NOTE — Telephone Encounter (Signed)
amLODipine (NORVASC) 10 MG tablet  insulin degludec (TRESIBA) 100 UNIT/ML FlexTouch Pen,   Dulaglutide (TRULICITY) 7.34 LP/3.7TK SOPN, refill request @ [place].

## 2021-11-19 ENCOUNTER — Other Ambulatory Visit (HOSPITAL_COMMUNITY): Payer: Self-pay

## 2021-12-19 ENCOUNTER — Other Ambulatory Visit (HOSPITAL_COMMUNITY): Payer: Self-pay

## 2022-01-09 ENCOUNTER — Other Ambulatory Visit (HOSPITAL_COMMUNITY): Payer: Self-pay

## 2022-01-13 ENCOUNTER — Other Ambulatory Visit (HOSPITAL_COMMUNITY): Payer: Self-pay

## 2022-01-16 ENCOUNTER — Other Ambulatory Visit (HOSPITAL_COMMUNITY): Payer: Self-pay

## 2022-01-21 ENCOUNTER — Other Ambulatory Visit (HOSPITAL_COMMUNITY): Payer: Self-pay

## 2022-01-21 ENCOUNTER — Telehealth: Payer: Self-pay

## 2022-01-21 ENCOUNTER — Ambulatory Visit (INDEPENDENT_AMBULATORY_CARE_PROVIDER_SITE_OTHER): Payer: Medicare Other | Admitting: Student

## 2022-01-21 VITALS — BP 149/70 | HR 67 | Temp 97.6°F | Ht 65.0 in | Wt 132.3 lb

## 2022-01-21 DIAGNOSIS — E1165 Type 2 diabetes mellitus with hyperglycemia: Secondary | ICD-10-CM

## 2022-01-21 DIAGNOSIS — E559 Vitamin D deficiency, unspecified: Secondary | ICD-10-CM | POA: Diagnosis not present

## 2022-01-21 DIAGNOSIS — Z794 Long term (current) use of insulin: Secondary | ICD-10-CM

## 2022-01-21 DIAGNOSIS — F331 Major depressive disorder, recurrent, moderate: Secondary | ICD-10-CM

## 2022-01-21 DIAGNOSIS — I10 Essential (primary) hypertension: Secondary | ICD-10-CM | POA: Diagnosis not present

## 2022-01-21 DIAGNOSIS — M8580 Other specified disorders of bone density and structure, unspecified site: Secondary | ICD-10-CM

## 2022-01-21 DIAGNOSIS — M1611 Unilateral primary osteoarthritis, right hip: Secondary | ICD-10-CM | POA: Diagnosis not present

## 2022-01-21 DIAGNOSIS — E1142 Type 2 diabetes mellitus with diabetic polyneuropathy: Secondary | ICD-10-CM

## 2022-01-21 LAB — POCT GLYCOSYLATED HEMOGLOBIN (HGB A1C): Hemoglobin A1C: 10 % — AB (ref 4.0–5.6)

## 2022-01-21 LAB — GLUCOSE, CAPILLARY: Glucose-Capillary: 169 mg/dL — ABNORMAL HIGH (ref 70–99)

## 2022-01-21 MED ORDER — DULOXETINE HCL 20 MG PO CPEP
20.0000 mg | ORAL_CAPSULE | Freq: Every day | ORAL | 2 refills | Status: DC
  Filled 2022-01-21: qty 30, 30d supply, fill #0

## 2022-01-21 NOTE — Progress Notes (Signed)
° °  CC: Follow-up, neuropathic pain  HPI:  Angie Cain is a 66 y.o. female with PMH as below who presents to clinic for follow-up on her diabetes and neuropathy. Please see problem based charting for evaluation, assessment and plan.  Past Medical History:  Diagnosis Date   Allergy    Anxiety    occasional   Arthritis    Atherosclerotic peripheral vascular disease with intermittent claudication ([place])    s/p fem-fem bypass 2011. ABI 07/2012 0.5 R 0.65 L   Diabetes mellitus without complication (HCC)    GERD (gastroesophageal reflux disease)    Hyperlipidemia    Hypertension    IDDM (insulin dependent diabetes mellitus) 07/21/2007   July 2011: Femoral-femoral bypass  ABI 08/11/2012 notable for 0.4-0.59 on right ankle suggestive of severe arterial occlusive disease at rest and 0.60-0.7 on left ankle suggestive of moderate arterial occlusive disease at rest.    Iron deficiency anemia 05/08/2017   Low back pain 12/18/2013   Personal history of colonic polyps - adenomas 03/07/2014   Rotator cuff impingement syndrome of left shoulder 03/23/2016   Tobacco abuse     Review of Systems:  Constitutional: Negative for fever, polydipsia or fatigue.  Eyes: Negative for visual changes MSK: Positive for occasional back pain GU: Negative for polyuria Neuro: Positive for nerve pain.  Negative for headache or weakness Psych: Positive for depression  Physical Exam: General: Pleasant, well dressed elderly female. No acute distress. Cardiac: RRR. No murmurs, rubs or gallops. No LE edema Respiratory: Lungs CTAB. No wheezing or crackles. Abdominal: Soft, symmetric and non tender. Normal BS. Skin: Warm, dry and intact without rashes or lesions Extremities: Atraumatic. Full ROM. 2+ radial, DP and PT pulses. Neuro: A&O x 3. Moves all extremities. Normal sensation to gross touch. Psych: Talkative but appropriate mood and affect.  Vitals:   01/21/22 0930  BP: 138/65  Pulse: 75  Temp: 97.6 F (36.4  C)  TempSrc: Oral  SpO2: 100%  Weight: 132 lb 4.8 oz (60 kg)  Height: 5\' 5"  (1.651 m)    Assessment & Plan:   See Encounters Tab for problem based charting.  Patient discussed with Dr. Louann Liv, MD, MPH

## 2022-01-21 NOTE — Patient Instructions (Signed)
Thank you, [name] for allowing [place] to provide your care today. Today we discussed your diabetes, neuropathy high blood pressure and depression.   Diabetes: Your A1c has gone up to 10 again.  It is very important to take you diabetes medication and insulin daily to reduce your risk of eye, heart and kidney issues in the future  Neuropathy: I am starting you with a medicine called duloxetine. I am starting at a low dose to see how you respond to this medicine.  We can go up on this medicine will help improve your nerve pain.  Depression: Please follow-up with Dr. Theodis Shove to discuss the stressors in your life  I have ordered the following labs for you:  Lab Orders         Glucose, capillary         BMP8+Anion Gap         Vitamin D (25 hydroxy)         POC Hbg A1C      I will call if any are abnormal. All of your labs can be accessed through "My Chart".   I have ordered the following medication/changed the following medications:  Start duloxetine 20 mg daily  My Chart Access: [email or web address]  Please follow-up in 3 months  Please make sure to arrive 15 minutes prior to your next appointment. If you arrive late, you may be asked to reschedule.    We look forward to seeing you next time. Please call our clinic at [phone] if you have any questions or concerns. The best time to call is Monday-Friday from 9am-4pm, but there is someone available 24/7. If after hours or the weekend, call the main hospital number and ask for the Internal Medicine Resident On-Call. If you need medication refills, please notify your pharmacy one week in advance and they will send [place] a request.   Thank you for letting us take part in your care. Wishing you the best!  Lacinda Axon, MD 01/21/2022, 10:38 AM IM Resident, PGY-2 [facility] 41:10

## 2022-01-21 NOTE — Telephone Encounter (Deleted)
PA  for pt ( [place] ) came through on cover my meds was submitted with office notes from 10/21/21 and labs from 2/8.Marland Kitchen awaiting approval or denial

## 2022-01-21 NOTE — Telephone Encounter (Deleted)
DECISION :    Approved today  Request Reference Number: [id number].   DEXCOM G6 MIS SENSOR is approved through 07/21/2022.   For further questions, call [employer] at [phone].   Drug  Dexcom G6 Sensor  Form [employer] Electronic Prior Authorization Form [id number] NCP[place])      ( COPY SENT TO PHARMACY ALSO )

## 2022-01-22 ENCOUNTER — Encounter: Payer: Self-pay | Admitting: Student

## 2022-01-22 DIAGNOSIS — E1140 Type 2 diabetes mellitus with diabetic neuropathy, unspecified: Secondary | ICD-10-CM | POA: Insufficient documentation

## 2022-01-22 NOTE — Assessment & Plan Note (Addendum)
Now uncontrolled. Patient had significant increase in her A1c from 7.4% 3 months ago to 10.0 today. Patient admitted to forgetting to inject the Trulicity sometimes.  Due to the significant stress at home, she sometimes does not feel like taking her medications or forgets to take them. States her diet has not been the most healthy. When asked how she was able to get her A1c down to 7.4 last year, patient stated that she had to focus on herself and not worry about anybody else's business. She is hoping to get her own place so she can focus on herself and managing her medical problems. She denies any polyuria, polydipsia, dizziness or headaches. Foot exam completed today.  Did not plan on making any significant changes to her regimen today as her elevated A1c is mostly due to lack of consistency with taking her medications.  Plan: -- Continue Tresiba 24 units daily -- Patient advised to set a schedule on her phone to remind her to take her Trulicity each week. -- Continue metformin 1000 mg daily -- Start duloxetine 20 mg daily -- Follow-up in 3 months for A1c check

## 2022-01-22 NOTE — Assessment & Plan Note (Signed)
PHQ-9 slightly improved from 11 1 year ago to 9 today.  Patient reports that she continues to experience significant stress from family members who wants her to take care of them and their personal issues.  She does not feel like she has time to focus on herself, often forgetting to take her medications as prescribed.  She has missed multiple doses of her Trulicity due to this.  She has not had a session with Dr. Carolynne Edouard since September 2022. Patient hesitant about starting a new medication for the depression.  However after discussing plan to start patient on a neuropathic medication that also help with depression, patient willing to try.  Plan: -- Start duloxetine 20 mg daily, can titrate up if tolerated by patient --Follow-up with Dr. Theodis Shove --Instructed to try stress reducing activities such as meditation and mindfulness

## 2022-01-22 NOTE — Progress Notes (Signed)
Internal Medicine Clinic Attending  Case discussed with Dr. Amponsah  At the time of the visit.  We reviewed the resident's history and exam and pertinent patient test results.  I agree with the assessment, diagnosis, and plan of care documented in the resident's note.  

## 2022-01-22 NOTE — Assessment & Plan Note (Signed)
Patient's BP slightly up today with SBP in the 130s.  Patient reports that she continues to experience significant stress from family members.  She is hoping to find her own place so she can focus on taking care of herself.  She denies any headaches, dizziness or blurry vision.  Plan: -- Continue amlodipine 10 mg daily -- Continue on losartan 50 mg daily -- Follow-up BMP -- BP check at next office visit

## 2022-01-22 NOTE — Assessment & Plan Note (Addendum)
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  Her diabetes was previously controlled but has been uncontrolled in the last few months.  She endorse worsening neuropathic pain mostly in her legs.  On exam there is normal sensation to gross touch.  Patient has tried gabapentin in the past without significant improvement in her pain.  Diabetic foot exam today shows normal sensation to the plantar aspect of feet.   Plan: -- Start duloxetine 20 mg daily, titrate up if tolerated by patient

## 2022-01-22 NOTE — Assessment & Plan Note (Signed)
Vitamin D levels within normal limits.  Patient due for DEXA scan. -- Schedule DEXA scan at next office visit

## 2022-01-22 NOTE — Assessment & Plan Note (Signed)
Patient continues to endorse occasional pain from right hip.  She completed multiple sessions of PT last year with some improvement.  Consider another referral to outpatient PT if pain not improved by next office visit.  Plan: -- Continue as needed Aleve -- Follow-up with orthopedic surgery as needed

## 2022-01-23 ENCOUNTER — Encounter: Payer: Self-pay | Admitting: Podiatry

## 2022-01-23 ENCOUNTER — Other Ambulatory Visit: Payer: Self-pay

## 2022-01-23 ENCOUNTER — Ambulatory Visit (INDEPENDENT_AMBULATORY_CARE_PROVIDER_SITE_OTHER): Payer: Medicare Other | Admitting: Podiatry

## 2022-01-23 DIAGNOSIS — Z794 Long term (current) use of insulin: Secondary | ICD-10-CM | POA: Diagnosis not present

## 2022-01-23 DIAGNOSIS — E1151 Type 2 diabetes mellitus with diabetic peripheral angiopathy without gangrene: Secondary | ICD-10-CM

## 2022-01-23 DIAGNOSIS — F17200 Nicotine dependence, unspecified, uncomplicated: Secondary | ICD-10-CM

## 2022-01-23 DIAGNOSIS — B351 Tinea unguium: Secondary | ICD-10-CM | POA: Diagnosis not present

## 2022-01-23 DIAGNOSIS — M79674 Pain in right toe(s): Secondary | ICD-10-CM

## 2022-01-23 DIAGNOSIS — M79675 Pain in left toe(s): Secondary | ICD-10-CM | POA: Diagnosis not present

## 2022-01-23 LAB — BMP8+ANION GAP
Anion Gap: 19 mmol/L — ABNORMAL HIGH (ref 10.0–18.0)
BUN/Creatinine Ratio: 20 (ref 12–28)
BUN: 12 mg/dL (ref 8–27)
CO2: 25 mmol/L (ref 20–29)
Calcium: 9.7 mg/dL (ref 8.7–10.3)
Chloride: 98 mmol/L (ref 96–106)
Creatinine, Ser: 0.61 mg/dL (ref 0.57–1.00)
Glucose: 148 mg/dL — ABNORMAL HIGH (ref 70–99)
Potassium: 3.1 mmol/L — ABNORMAL LOW (ref 3.5–5.2)
Sodium: 142 mmol/L (ref 134–144)
eGFR: 98 mL/min/{1.73_m2} (ref >59)

## 2022-01-23 LAB — VITAMIN D 25 HYDROXY (VIT D DEFICIENCY, FRACTURES): Vit D, 25-Hydroxy: 43.7 ng/mL (ref 30.0–100.0)

## 2022-01-27 ENCOUNTER — Other Ambulatory Visit (HOSPITAL_COMMUNITY): Payer: Self-pay

## 2022-01-29 NOTE — Progress Notes (Signed)
°  Subjective:  Patient ID: Angie Cain, female    DOB: [birth year],  MRN: [id number]  Angie Cain presents to clinic today for preventative diabetic foot care and painful thick toenails that are difficult to trim. Pain interferes with ambulation. Aggravating factors include wearing enclosed shoe gear. Pain is relieved with periodic professional debridement.  New problem(s): None.   PCP is Lacinda Axon, MD , and last visit was January 21, 2022.  Allergies  Allergen Reactions   Bupropion Hives and Swelling   Jardiance [Empagliflozin] Other (See Comments)    Caused a UTI   Metronidazole Itching   Shellfish Allergy Itching and Other (See Comments)    Mouth irritation/itching   Shrimp Extract Allergy Skin Test Itching    Review of Systems: Negative except as noted in the HPI. Objective:   Constitutional Angie Cain is a pleasant 66 y.o. African American female, WD, WN in NAD. AAO x 3.   Vascular CFT <3 seconds b/l LE. Faintly palpable DP pulses b/l LE. Diminished PT pulse(s) b/l LE.  Neurologic Normal speech. Oriented to person, place, and time. Protective sensation intact 5/5 intact bilaterally with 10g monofilament b/l. Vibratory sensation intact b/l.  Dermatologic Pedal skin thin and atrophic b/l LE. No open wounds b/l LE. No interdigital macerations noted b/l LE. Toenails 1-5 b/l elongated, discolored, dystrophic, thickened, crumbly with subungual debris and tenderness to dorsal palpation.  Orthopedic: Muscle strength 5/5 to all lower extremity muscle groups bilaterally. HAV with bunion deformity noted b/l LE. Hammertoe deformity noted 2-5 b/l.   Radiographs: None  Last A1c:  Hemoglobin A1C Latest Ref Rng & Units 01/21/2022 10/21/2021 07/17/2021 04/08/2021  HGBA1C 4.0 - 5.6 % 10.0(A) 7.4(A) 11.0(A) 11.0(A)  Some recent data might be hidden    Assessment:   1. Pain due to onychomycosis of toenails of both feet   2. Tobacco use disorder   3. Type 2 diabetes  mellitus with diabetic peripheral angiopathy without gangrene, with long-term current use of insulin ([place])    Plan:  Patient was evaluated and treated and all questions answered. Consent given for treatment as described below: -Ordered ABIs/TBIs for diminished PT pulses and h/o tobacco use. -Mycotic toenails 1-5 bilaterally were debrided in length and girth with sterile nail nippers and dremel without incident. -Patient/POA to call should there be question/concern in the interim.  Return in about 3 months (around 04/22/2022).  Marzetta Board, DPM

## 2022-02-02 ENCOUNTER — Ambulatory Visit (HOSPITAL_COMMUNITY)
Admission: EM | Admit: 2022-02-02 | Discharge: 2022-02-02 | Disposition: A | Discharge status: Home / Self Care | Payer: Medicare Other | Attending: Physician Assistant | Admitting: Physician Assistant

## 2022-02-02 ENCOUNTER — Other Ambulatory Visit: Payer: Self-pay

## 2022-02-02 ENCOUNTER — Encounter (HOSPITAL_COMMUNITY): Payer: Self-pay | Admitting: *Deleted

## 2022-02-02 DIAGNOSIS — L0100 Impetigo, unspecified: Secondary | ICD-10-CM | POA: Diagnosis not present

## 2022-02-02 DIAGNOSIS — R238 Other skin changes: Secondary | ICD-10-CM | POA: Diagnosis not present

## 2022-02-02 MED ORDER — MUPIROCIN 2 % EX OINT
1.0000 "application " | TOPICAL_OINTMENT | Freq: Every day | CUTANEOUS | 0 refills | Status: DC
  Filled 2022-02-02: qty 22, 30d supply, fill #0

## 2022-02-02 MED ORDER — MUPIROCIN 2 % EX OINT: 1.0000 "application " | TOPICAL_OINTMENT | Freq: Every day | CUTANEOUS | 0 refills | Status: DC

## 2022-02-02 MED ORDER — CETIRIZINE HCL 10 MG PO TABS: 10.0000 mg | ORAL_TABLET | Freq: Every day | ORAL | 0 refills | Status: DC

## 2022-02-02 MED ORDER — FAMOTIDINE 20 MG PO TABS: 20.0000 mg | ORAL_TABLET | Freq: Every day | ORAL | 0 refills | Status: DC

## 2022-02-02 MED ORDER — CETIRIZINE HCL 10 MG PO TABS
10.0000 mg | ORAL_TABLET | Freq: Every day | ORAL | 0 refills | Status: DC
  Filled 2022-02-02: qty 7, 7d supply, fill #0

## 2022-02-02 MED ORDER — FAMOTIDINE 20 MG PO TABS
20.0000 mg | ORAL_TABLET | Freq: Every day | ORAL | 0 refills | Status: DC
  Filled 2022-02-02: qty 7, 7d supply, fill #0

## 2022-02-02 NOTE — ED Provider Notes (Signed)
[facility]    CSN: [id number] Arrival date & time: 02/02/22  1741      History   Chief Complaint Chief Complaint  Patient presents with   Facial Swelling   Allergic Reaction    HPI Angie Cain is a 66 y.o. female.   Patient presents today with swelling and pain involving her lower face.  Reports symptoms began immediately after she had here removal procedure with wax.  She initially had irritation but this is since gotten worse and she now has swelling, intense pruritus, skin lesions, serous drainage.  She has been applying ice and Neosporin without improvement of symptoms.  She has not been using any over-the-counter medications outside of Neosporin for symptom management.  Denies history of similar allergic reactions.  Denies any recent antibiotic use.  She denies any difficulty speaking, difficulty swallowing, shortness of breath, swelling of her throat.   Past Medical History:  Diagnosis Date   Allergy    Anxiety    occasional   Arthritis    Atherosclerotic peripheral vascular disease with intermittent claudication ([place])    s/p fem-fem bypass 2011. ABI 07/2012 0.5 R 0.65 L   Diabetes mellitus without complication (HCC)    GERD (gastroesophageal reflux disease)    Hyperlipidemia    Hypertension    IDDM (insulin dependent diabetes mellitus) 07/21/2007   July 2011: Femoral-femoral bypass  ABI 08/11/2012 notable for 0.4-0.59 on right ankle suggestive of severe arterial occlusive disease at rest and 0.60-0.7 on left ankle suggestive of moderate arterial occlusive disease at rest.    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   Abdominal pain 02/19/2018   Allergic rhinitis 10/16/2017   Major depression 10/16/2017   Healthcare maintenance 03/18/2017   Osteopenia determined by x-ray 01/25/2015   Bilateral plantar fasciitis 09/30/2014   Hx of adenomatous colonic polyps 03/07/2014   GERD (gastroesophageal reflux disease) 06/06/2013   Hyperlipidemia associated with type 2 diabetes mellitus ([place]) 06/06/2013   Tobacco use disorder 07/21/2007   Essential hypertension 07/21/2007   Insulin dependent type 2 diabetes mellitus, uncontrolled ([place]) 07/20/1993    Past Surgical History:  Procedure Laterality Date   BREAST BIOPSY Left 02/08/2019   clip placement   VAGINAL HYSTERECTOMY     VEIN BYPASS SURGERY  06/13/2010   vein from left to right hip    OB History   No obstetric history on file.      Home Medications    Prior to Admission medications   Medication Sig Start Date End Date Taking? Authorizing Provider  cetirizine (ZYRTEC ALLERGY) 10 MG tablet Take 1 tablet (10 mg total) by mouth daily. 02/02/22  Yes Silvia Hightower K, PA-C  famotidine (PEPCID) 20 MG tablet Take 1 tablet (20 mg total) by mouth at bedtime. 02/02/22  Yes Juno Bozard, Junie Panning K, PA-C  mupirocin ointment (BACTROBAN) 2 % Apply 1 application topically daily. 02/02/22  Yes Avrianna Smart, Derry Skill, PA-C  Accu-Chek FastClix Lancets MISC CHECK BLOOD SUGAR 3 TIMES A DAY 06/27/21  Sanjuan Dame, MD  Alcohol Swabs (B-D SINGLE USE SWABS REGULAR) PADS 1 each by Does not apply route 6 (six) times daily. 02/26/20   Chundi, Verne Spurr, MD  ALEVE 220 MG tablet Take 220-440 mg by mouth 2 (two) times daily as needed (for pain).    [provider]  amLODipine (NORVASC) 10 MG tablet Take 1 tablet (10 mg total) by mouth daily. 09/09/21   Maudie Mercury, MD  aspirin EC 81 MG tablet Take 81 mg by mouth daily.    [provider]  atorvastatin (LIPITOR) 40 MG tablet Take 1 tablet (40 mg total) by mouth at bedtime. 09/09/21   Maudie Mercury, MD  Blood Glucose Calibration (ACCU-CHEK AVIVA) SOLN Use to check controls on diabetic glucose meter monthly 02/26/20   Chundi, Verne Spurr, MD  Blood Glucose Monitoring Suppl (ACCU-CHEK AVIVA PLUS) w/Device KIT Check blood sugar up to 3 times a day 02/26/20   Lars Mage, MD  calcium-vitamin D (OSCAL WITH D) 500-200 MG-UNIT tablet Take 1 tablet by mouth 2 (two) times daily. 03/17/19   Chundi, Verne Spurr, MD  diclofenac Sodium (VOLTAREN) 1 % GEL Apply 2 g topically 4 (four) times daily. Patient taking differently: Apply 2 g topically 4 (four) times daily as needed (for pain). 06/12/21   Rehman, Areeg N, DO  Dulaglutide (TRULICITY) 9.93 ZJ/6.9CV SOPN Inject 0.75 mg into the skin once a week. 10/21/21   Sanjuan Dame, MD  DULoxetine (CYMBALTA) 20 MG capsule Take 1 capsule (20 mg total) by mouth daily. 01/21/22   Lacinda Axon, MD  glucose blood (ACCU-CHEK GUIDE) test strip Check blood sugar 3 times per day 07/07/21   Lacinda Axon, MD  insulin degludec (TRESIBA) 100 UNIT/ML FlexTouch Pen Inject 24 Units into the skin daily. 10/21/21   Sanjuan Dame, MD  Insulin Pen Needle 31G X 5 MM MISC USE TO INJECT INSULIN INTO THE SKIN 1 TIME DAILY AT BEDTIME 07/07/21 07/07/22  Lacinda Axon, MD  Insulin Syringe-Needle U-100 31G X 15/64" 0.3 ML MISC Use to inject insulin one time daily 08/13/17   Annia Belt, MD  latanoprost (XALATAN) 0.005 % ophthalmic solution PLACE 1 DROP INTO BOTH EYES AT BEDTIME. 06/27/21   Sanjuan Dame, MD  losartan (COZAAR) 50 MG tablet TAKE 1 TABLET BY MOUTH ONCE DAILY Patient taking differently: Take 50 mg by mouth at bedtime. 07/07/21 07/07/22  Lacinda Axon, MD  metFORMIN (GLUCOPHAGE XR) 500 MG 24 hr tablet Take 2 tablets (1,000 mg total) by mouth daily with breakfast. 09/09/21 09/09/22  Maudie Mercury, MD  timolol (TIMOPTIC) 0.5 %  ophthalmic solution INSTILL 1 DROP INTO BOTH EYES EVERY MORNING Patient taking differently: Place 1 drop into both eyes in the morning. 07/17/21 07/17/22  Lacinda Axon, MD    Family History Family History  Problem Relation Age of Onset   Diabetes Mother    Diabetes Father    Diabetes Sister    Hypertension Sister    Diabetes Brother    Hypertension Brother    Diabetes Son    Diabetes Brother    Diabetes Brother    Diabetes Brother    Diabetes Brother    Diabetes Brother    Colon cancer Neg Hx    Esophageal cancer Neg Hx    Stomach cancer Neg Hx    Rectal cancer Neg Hx    Breast cancer Neg Hx     Social History Social History   Tobacco Use   Smoking status: Every Day  Packs/day: 0.10    Types: Cigarettes   Smokeless tobacco: Never   Tobacco comments:    Smokes 2 cigs/day.  Vaping Use   Vaping Use: Never used  Substance Use Topics   Alcohol use: Yes    Alcohol/week: 1.0 - 2.0 standard drink    Types: 1 - 2 Glasses of wine per week    Comment: weekly   Drug use: No     Allergies   Bupropion, Jardiance [empagliflozin], Metronidazole, Shellfish allergy, and Shrimp extract allergy skin test   Review of Systems Review of Systems  Constitutional:  Positive for activity change. Negative for appetite change, fatigue and fever.  Respiratory:  Negative for cough and shortness of breath.   Cardiovascular:  Negative for chest pain.  Gastrointestinal:  Negative for abdominal pain, diarrhea, nausea and vomiting.  Skin:  Positive for rash and wound.    Physical Exam Triage Vital Signs ED Triage Vitals  Enc Vitals Group     BP 02/02/22 1841 (!) 159/90     Pulse Rate 02/02/22 1841 75     Resp 02/02/22 1841 20     Temp 02/02/22 1841 97.7 F (36.5 C)     Temp src --      SpO2 02/02/22 1841 96 %     Weight --      Height --      Head Circumference --      Peak Flow --      Pain Score 02/02/22 1837 8     Pain Loc --      Pain Edu? --      Excl. in [place]? --     No data found.  Updated Vital Signs BP (!) 159/90    Pulse 75    Temp 97.7 F (36.5 C)    Resp 20    SpO2 96%   Visual Acuity Right Eye Distance:   Left Eye Distance:   Bilateral Distance:    Right Eye Near:   Left Eye Near:    Bilateral Near:     Physical Exam Vitals reviewed.  Constitutional:      General: She is awake. She is not in acute distress.    Appearance: Normal appearance. She is well-developed. She is not ill-appearing.     Comments: Very pleasant female presented age no acute distress sitting comfortably in exam room  HENT:     Head: Normocephalic and atraumatic.     Mouth/Throat:     Pharynx: Uvula midline. No oropharyngeal exudate or posterior oropharyngeal erythema.     Comments: Normal-appearing posterior pharynx Cardiovascular:     Rate and Rhythm: Normal rate and regular rhythm.     Heart sounds: Normal heart sounds, S1 normal and S2 normal. No murmur heard. Pulmonary:     Effort: Pulmonary effort is normal.     Breath sounds: Normal breath sounds. No wheezing, rhonchi or rales.     Comments: Clear auscultation bilaterally Abdominal:     General: Bowel sounds are normal.     Palpations: Abdomen is soft.     Tenderness: There is no abdominal tenderness. There is no right CVA tenderness, left CVA tenderness, guarding or rebound.  Skin:    Comments: Maculopapular rash with honey colored drainage noted periorbital region with serous drainage from lesions of her mandible.  Psychiatric:        Behavior: Behavior is cooperative.       UC Treatments / Results  Labs (all labs ordered are listed, but only  abnormal results are displayed) Labs Reviewed - No data to display  EKG   Radiology No results found.  Procedures Procedures (including critical care time)  Medications Ordered in UC Medications - No data to display  Initial Impression / Assessment and Plan / UC Course  I have reviewed the triage vital signs and the nursing  notes.  Pertinent labs & imaging results that were available during my care of the patient were reviewed by me and considered in my medical decision making (see chart for details).     Will cover with mupirocin in case secondary infection is contributing to ongoing symptoms.  Discussed that symptoms are likely related to chemical burn versus irritation.  She was encouraged to keep area clean and use Aquaphor or Vaseline for moisturizer.  She does have a history of diabetes as poorly controlled with last A1c 10.0% so we will defer steroids for the time being.  We will treat allergic character with alternate H1 and H2 blocker.  Recommended she avoid recurrent exposure to irritant.  Discussed that if she has any worsening symptoms including swelling of her throat, spread of rash, difficulty speaking, difficulty swallowing she needs to be seen immediately.  If symptoms not improving she should follow-up with PCP or consider dermatology referral.  Strict return precautions given to which she expressed understanding.  Final Clinical Impressions(s) / UC Diagnoses   Final diagnoses:  Skin irritation  Impetigo     Discharge Instructions      Keep area clean with gentle soap and water.  Apply Aquaphor or Vaseline to keep area hydrated.  Use mupirocin daily to cover for infection.  Alternate cetirizine in the morning and famotidine at night.  If anything worsens and you have spread of rash, difficulty speaking, difficulty swallowing, shortness of breath you need to be seen immediately.  Please follow-up with our clinic or primary care next week if symptoms or not improving.     ED Prescriptions     Medication Sig Dispense Auth. Provider   mupirocin ointment (BACTROBAN) 2 % Apply 1 application topically daily. 22 g Mega Kinkade K, PA-C   cetirizine (ZYRTEC ALLERGY) 10 MG tablet Take 1 tablet (10 mg total) by mouth daily. 7 tablet Bertha Earwood K, PA-C   famotidine (PEPCID) 20 MG tablet Take 1 tablet  (20 mg total) by mouth at bedtime. 7 tablet Hazelle Woollard, Derry Skill, PA-C      PDMP not reviewed this encounter.   Terrilee Croak, PA-C 02/02/22 1919

## 2022-02-02 NOTE — Discharge Instructions (Signed)
Keep area clean with gentle soap and water.  Apply Aquaphor or Vaseline to keep area hydrated.  Use mupirocin daily to cover for infection.  Alternate cetirizine in the morning and famotidine at night.  If anything worsens and you have spread of rash, difficulty speaking, difficulty swallowing, shortness of breath you need to be seen immediately.  Please follow-up with our clinic or primary care next week if symptoms or not improving.

## 2022-02-02 NOTE — ED Triage Notes (Signed)
Pt reports having a facial wax last Tuesday . Pt woke up on WED. With facial swelling. Today skin is wet and peeling.

## 2022-02-03 ENCOUNTER — Other Ambulatory Visit (HOSPITAL_COMMUNITY): Payer: Self-pay

## 2022-02-09 ENCOUNTER — Other Ambulatory Visit: Payer: Self-pay

## 2022-02-09 ENCOUNTER — Other Ambulatory Visit (HOSPITAL_COMMUNITY): Payer: Self-pay

## 2022-02-09 ENCOUNTER — Ambulatory Visit (HOSPITAL_COMMUNITY)
Admission: RE | Admit: 2022-02-09 | Discharge: 2022-02-09 | Disposition: A | Discharge status: Home / Self Care | Payer: Medicare Other | Source: Ambulatory Visit | Attending: Podiatry | Admitting: Podiatry

## 2022-02-09 DIAGNOSIS — E1151 Type 2 diabetes mellitus with diabetic peripheral angiopathy without gangrene: Secondary | ICD-10-CM | POA: Diagnosis not present

## 2022-02-09 DIAGNOSIS — Z794 Long term (current) use of insulin: Secondary | ICD-10-CM | POA: Diagnosis not present

## 2022-02-09 DIAGNOSIS — F17200 Nicotine dependence, unspecified, uncomplicated: Secondary | ICD-10-CM

## 2022-02-10 ENCOUNTER — Other Ambulatory Visit (HOSPITAL_COMMUNITY): Payer: Self-pay

## 2022-02-10 MED ORDER — TRULICITY 0.75 MG/0.5ML ~~LOC~~ SOAJ
0.7500 mg | SUBCUTANEOUS | 2 refills | Status: DC
  Filled 2022-02-10: qty 2, 28d supply, fill #0

## 2022-02-10 NOTE — Progress Notes (Signed)
? ? ?Subjective:  ?CC: hirsutism, contact dermatitis ? ?HPI: ? ?[name] is a 66 y.o. female with a past medical history stated below and presents today for hirsutism and contact dermatitis. Please see problem based assessment and plan for additional details. ? ?Past Medical History:  ?Diagnosis Date  ? Allergy   ? Anxiety   ? occasional  ? Arthritis   ? Atherosclerotic peripheral vascular disease with intermittent claudication ([place])   ? s/p fem-fem bypass 2011. ABI 07/2012 0.5 R 0.65 L  ? Diabetes mellitus without complication ([place])   ? GERD (gastroesophageal reflux disease)   ? Hyperlipidemia   ? Hypertension   ? IDDM (insulin dependent diabetes mellitus) 07/21/2007  ? July 2011: Femoral-femoral bypass  ABI 08/11/2012 notable for 0.4-0.59 on right ankle suggestive of severe arterial occlusive disease at rest and 0.60-0.7 on left ankle suggestive of moderate arterial occlusive disease at rest.   ? Iron deficiency anemia 05/08/2017  ? Low back pain 12/18/2013  ? Personal history of colonic polyps - adenomas 03/07/2014  ? Rotator cuff impingement syndrome of left shoulder 03/23/2016  ? Tobacco abuse   ? ? ?Current Outpatient Medications on File Prior to Visit  ?Medication Sig Dispense Refill  ? Accu-Chek FastClix Lancets MISC CHECK BLOOD SUGAR 3 TIMES A DAY 306 each 1  ? Alcohol Swabs (B-D SINGLE USE SWABS REGULAR) PADS 1 each by Does not apply route 6 (six) times daily. 600 each 1  ? ALEVE 220 MG tablet Take 220-440 mg by mouth 2 (two) times daily as needed (for pain).    ? amLODipine (NORVASC) 10 MG tablet Take 1 tablet (10 mg total) by mouth daily. 60 tablet 3  ? aspirin EC 81 MG tablet Take 81 mg by mouth daily.    ? atorvastatin (LIPITOR) 40 MG tablet Take 1 tablet (40 mg total) by mouth at bedtime. 90 tablet 3  ? Blood Glucose Calibration (ACCU-CHEK AVIVA) SOLN Use to check controls on diabetic glucose meter monthly 1 each 0  ? Blood Glucose Monitoring Suppl (ACCU-CHEK AVIVA PLUS) w/Device KIT Check blood  sugar up to 3 times a day 1 kit 1  ? calcium-vitamin D (OSCAL WITH D) 500-200 MG-UNIT tablet Take 1 tablet by mouth 2 (two) times daily. 60 tablet 3  ? cetirizine (ZYRTEC ALLERGY) 10 MG tablet Take 1 tablet (10 mg total) by mouth daily. 7 tablet 0  ? diclofenac Sodium (VOLTAREN) 1 % GEL Apply 2 g topically 4 (four) times daily. (Patient taking differently: Apply 2 g topically 4 (four) times daily as needed (for pain).) 100 g 2  ? DULoxetine (CYMBALTA) 20 MG capsule Take 1 capsule (20 mg total) by mouth daily. 30 capsule 2  ? famotidine (PEPCID) 20 MG tablet Take 1 tablet (20 mg total) by mouth at bedtime. 7 tablet 0  ? glucose blood (ACCU-CHEK GUIDE) test strip Check blood sugar 3 times per day 300 each 1  ? Insulin Pen Needle 31G X 5 MM MISC USE TO INJECT INSULIN INTO THE SKIN 1 TIME DAILY AT BEDTIME 100 each 1  ? Insulin Syringe-Needle U-100 31G X 15/64" 0.3 ML MISC Use to inject insulin one time daily 100 each 12  ? latanoprost (XALATAN) 0.005 % ophthalmic solution PLACE 1 DROP INTO BOTH EYES AT BEDTIME. 7.5 mL 0  ? losartan (COZAAR) 50 MG tablet TAKE 1 TABLET BY MOUTH ONCE DAILY (Patient taking differently: Take 50 mg by mouth at bedtime.) 90 tablet 1  ? metFORMIN (GLUCOPHAGE XR) 500 MG  24 hr tablet Take 2 tablets (1,000 mg total) by mouth daily with breakfast. 60 tablet 11  ? mupirocin ointment (BACTROBAN) 2 % Apply 1 application topically daily. 22 g 0  ? timolol (TIMOPTIC) 0.5 % ophthalmic solution INSTILL 1 DROP INTO BOTH EYES EVERY MORNING (Patient taking differently: Place 1 drop into both eyes in the morning.) 5 mL 11  ? ?No current facility-administered medications on file prior to visit.  ? ? ?Family History  ?Problem Relation Age of Onset  ? Diabetes Mother   ? Diabetes Father   ? Diabetes Sister   ? Hypertension Sister   ? Diabetes Brother   ? Hypertension Brother   ? Diabetes Son   ? Diabetes Brother   ? Diabetes Brother   ? Diabetes Brother   ? Diabetes Brother   ? Diabetes Brother   ? Colon cancer  Neg Hx   ? Esophageal cancer Neg Hx   ? Stomach cancer Neg Hx   ? Rectal cancer Neg Hx   ? Breast cancer Neg Hx   ? ? ?Social History  ? ?Socioeconomic History  ? Marital status: Married  ?  Spouse name: Not on file  ? Number of children: Not on file  ? Years of education: Not on file  ? Highest education level: Not on file  ?Occupational History  ? Not on file  ?Tobacco Use  ? Smoking status: Every Day  ?  Packs/day: 0.10  ?  Types: Cigarettes  ? Smokeless tobacco: Never  ? Tobacco comments:  ?  Smokes 2 cigs/day.  ?Vaping Use  ? Vaping Use: Never used  ?Substance and Sexual Activity  ? Alcohol use: Yes  ?  Alcohol/week: 1.0 - 2.0 standard drink  ?  Types: 1 - 2 Glasses of wine per week  ?  Comment: weekly  ? Drug use: No  ? Sexual activity: Yes  ?  Partners: Male  ?Other Topics Concern  ? Not on file  ?Social History Narrative  ? Current Social History 11/22/2020    ?   ? Patient lives with spouse in an apartment which is 1 story. There are no steps up to the entrance.   ?   ? Patient's method of transportation is personal car.  ?   ? The highest level of education was high school diploma.  ?   ? The patient currently disabled.  ?   ? Identified important Relationships are Husband and sons   ?   ? Pets : None  ?    ? Interests / Fun: Arts and crafts, music, reading   ?   ? Current Stressors: depression   ?   ? Religious / Personal Beliefs: Christian, non-denominational   ? ?Social Determinants of Health  ? ?Financial Resource Strain: Not on file  ?Food Insecurity: Not on file  ?Transportation Needs: Not on file  ?Physical Activity: Not on file  ?Stress: Not on file  ?Social Connections: Not on file  ?Intimate Partner Violence: Not on file  ? ? ?Review of Systems: ?ROS negative except for what is noted on the assessment and plan. ? ?Objective:  ? ?Vitals:  ? 02/11/22 0959  ?BP: 120/70  ?Pulse: 89  ?SpO2: 100%  ?Weight: 129 lb 14.4 oz (58.9 kg)  ? ? ?Physical Exam: ?Gen: A&O x3 and in no apparent distress, well  appearing and nourished. ?HEENT:  ?  Head - course hair present on upper lip, chin, and neck, skin is smooth, no edema or  erythema present ?  Eye - visual acuity grossly intact, conjunctiva clear, sclera non-icteric, EOM intact.  ?Neck: no masses or nodules, AROM intact. ?CV: RRR, no murmurs, S1/S2 presents  ?Resp: Clear to ascultation bilaterally  ?MSK: Grossly normal AROM and strength x4 extremities. ?Skin: good skin turgor, no rashes, unusual bruising, or prominent lesions.  ?Neuro: No focal deficits, grossly normal sensation and coordination.  ?Psych: Oriented x3 and responding appropriately. Intact memory, normal mood, judgement, affect, and insight.  ? ? ?Assessment & Plan:  ?See Encounters Tab for problem based charting. ? ?Patient discussed with Dr. Evette Doffing ? ? ?Christiana Fuchs, D.O. ?[facility]  PGY-1 ?Pager: [phone]  Phone: [phone] ?Date 02/12/2022  Time 4:21 PM  ? ? ?

## 2022-02-11 ENCOUNTER — Other Ambulatory Visit: Payer: Self-pay | Admitting: Student

## 2022-02-11 ENCOUNTER — Other Ambulatory Visit (HOSPITAL_COMMUNITY): Payer: Self-pay

## 2022-02-11 ENCOUNTER — Ambulatory Visit (INDEPENDENT_AMBULATORY_CARE_PROVIDER_SITE_OTHER): Payer: Medicare Other | Admitting: Internal Medicine

## 2022-02-11 VITALS — BP 120/70 | HR 89 | Wt 129.9 lb

## 2022-02-11 DIAGNOSIS — L245 Irritant contact dermatitis due to other chemical products: Secondary | ICD-10-CM

## 2022-02-11 DIAGNOSIS — E1165 Type 2 diabetes mellitus with hyperglycemia: Secondary | ICD-10-CM

## 2022-02-11 DIAGNOSIS — H9313 Tinnitus, bilateral: Secondary | ICD-10-CM

## 2022-02-11 DIAGNOSIS — L680 Hirsutism: Secondary | ICD-10-CM

## 2022-02-11 DIAGNOSIS — Z794 Long term (current) use of insulin: Secondary | ICD-10-CM

## 2022-02-11 MED ORDER — INSULIN DEGLUDEC 100 UNIT/ML ~~LOC~~ SOPN
24.0000 [IU] | PEN_INJECTOR | Freq: Every day | SUBCUTANEOUS | 2 refills | Status: DC
  Filled 2022-02-11 – 2022-02-23 (×9): qty 15, 62d supply, fill #0

## 2022-02-11 MED ORDER — TRULICITY 0.75 MG/0.5ML ~~LOC~~ SOAJ
0.7500 mg | SUBCUTANEOUS | 3 refills | Status: DC
  Filled 2022-02-11: qty 6, 84d supply, fill #0

## 2022-02-11 NOTE — Patient Instructions (Signed)
Angie Cain, it was a pleasure seeing you today! ? ?Today we discussed: ?Skin irritation on chin: ?Please continue using Aquaphor daily. I am glad to hear that your skin is getting better. If you still are concerned about it, please give the clinic a call to be see again. We will hold off on dermatology referral. The best way to remove hair would be mechanically with tweezers, shaving or you could try threading. Try to not use any chemicals on your face. ? ?Diabetes: ?I have refilled trulicity and tresiba. Please come back in 2 months to recheck blood work. ? ?Ringing in ears: ?I have ordered a hearing screening to help [place] understand why you are having ringing in your ears. ? ?I have ordered the following labs today: ? ?Lab Orders  ?No laboratory test(s) ordered today  ?  ? ?I will call if any are abnormal. All of your labs can be accessed through "My Chart" ?  ?My Chart Access: ?[email or web address] ? ?Tests ordered today: ? ?Hearing screening ? ?Referrals ordered today:  ? ?Referral Orders  ?No referral(s) requested today  ?  ? ?I have ordered the following medication/changed the following medications:  ? ?Stop the following medications: ?There are no discontinued medications.  ? ?Start the following medications: ?No orders of the defined types were placed in this encounter. ?  ? ?Follow-up: 2 months to recheck A1c ? ?Please make sure to arrive 15 minutes prior to your next appointment. If you arrive late, you may be asked to reschedule.  ? ?We look forward to seeing you next time. Please call our clinic at [phone] if you have any questions or concerns. The best time to call is Monday-Friday from 9am-4pm, but there is someone available 24/7. If after hours or the weekend, call the main hospital number and ask for the Internal Medicine Resident On-Call. If you need medication refills, please notify your pharmacy one week in advance and they will send [place] a  request. ? ?Thank you for letting us take part in your care. Wishing you the best! ? ?Thank you, ?Dr. Howie Ill ?[place]  ?

## 2022-02-12 ENCOUNTER — Other Ambulatory Visit (HOSPITAL_COMMUNITY): Payer: Self-pay

## 2022-02-12 DIAGNOSIS — L259 Unspecified contact dermatitis, unspecified cause: Secondary | ICD-10-CM | POA: Insufficient documentation

## 2022-02-12 DIAGNOSIS — L680 Hirsutism: Secondary | ICD-10-CM | POA: Insufficient documentation

## 2022-02-12 NOTE — Assessment & Plan Note (Addendum)
Patient presents after wax procedure of face. She presented to urgent care 2/20 due to facial swelling and pain of lower face.  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  She states that her skin initially had bumps to her skin and area burned. Now her skin is smooth and she no longer has burning. She does endorse some itching.  Her main concern is finding a safe way to remove the hair on her chin without applying chemicals. Please see note on hirsutism. ?P: ?-continue applying aquaphor as needed ?

## 2022-02-12 NOTE — Assessment & Plan Note (Signed)
Patient states that she after she went through menopause she had increased hair on her face. She was using a tweezer to remove but felt like she was having to do that daily so wanted to try waxing. I talked with patient about normal changes that she is seeing following menopause. We talked about other mechanical methods she could try to remove hair including threading or an electric shaver. ?

## 2022-02-12 NOTE — Assessment & Plan Note (Signed)
Patient endorses continued tinnitus symptoms. She states that it is bilateral and high pitched. She request that her hearing be checked. ?P: ?-hearing screening ?

## 2022-02-12 NOTE — Assessment & Plan Note (Signed)
Patient requests refill of insulin. Last A1c increased from 7.4->10 01/21/22.  ?P ?-Refilled [place] and trulicity ?

## 2022-02-13 ENCOUNTER — Other Ambulatory Visit (HOSPITAL_COMMUNITY): Payer: Self-pay

## 2022-02-13 ENCOUNTER — Telehealth: Payer: Self-pay

## 2022-02-13 NOTE — Telephone Encounter (Addendum)
15 mL with 2 refills sent to [facility] on 3/1. Spoke with Wells Guiles at [facility]. States earliest fill date is 3/26. Patient notified. States she is out of this med. She will call [facility] to see what can be done. ?

## 2022-02-13 NOTE — Telephone Encounter (Signed)
insulin degludec (TRESIBA) ?[facility]  ?[address] [place] [address]  ?

## 2022-02-16 ENCOUNTER — Other Ambulatory Visit (HOSPITAL_COMMUNITY): Payer: Self-pay

## 2022-02-16 NOTE — Telephone Encounter (Signed)
Call from patient stating she is out of the [place] and did not receive from the pharmacy.  Call to Pharmacy said that patient got 62 days worth 11/21/2022.

## 2022-02-16 NOTE — Progress Notes (Signed)
Internal Medicine Clinic Attending  Case discussed with Dr. Masters  At the time of the visit.  We reviewed the resident's history and exam and pertinent patient test results.  I agree with the assessment, diagnosis, and plan of care documented in the resident's note.  

## 2022-02-18 ENCOUNTER — Other Ambulatory Visit (HOSPITAL_COMMUNITY): Payer: Self-pay

## 2022-02-22 ENCOUNTER — Emergency Department (HOSPITAL_COMMUNITY)
Admission: EM | Admit: 2022-02-22 | Discharge: 2022-02-22 | Disposition: A | Discharge status: Home / Self Care | Payer: Medicare Other | Attending: Emergency Medicine | Admitting: Emergency Medicine

## 2022-02-22 ENCOUNTER — Other Ambulatory Visit: Payer: Self-pay

## 2022-02-22 ENCOUNTER — Encounter (HOSPITAL_COMMUNITY): Payer: Self-pay | Admitting: Emergency Medicine

## 2022-02-22 DIAGNOSIS — Z720 Tobacco use: Secondary | ICD-10-CM | POA: Diagnosis not present

## 2022-02-22 DIAGNOSIS — R739 Hyperglycemia, unspecified: Secondary | ICD-10-CM

## 2022-02-22 DIAGNOSIS — E1165 Type 2 diabetes mellitus with hyperglycemia: Secondary | ICD-10-CM | POA: Diagnosis not present

## 2022-02-22 DIAGNOSIS — Z7984 Long term (current) use of oral hypoglycemic drugs: Secondary | ICD-10-CM | POA: Diagnosis not present

## 2022-02-22 DIAGNOSIS — R7981 Abnormal blood-gas level: Secondary | ICD-10-CM | POA: Diagnosis not present

## 2022-02-22 DIAGNOSIS — Z7982 Long term (current) use of aspirin: Secondary | ICD-10-CM | POA: Insufficient documentation

## 2022-02-22 DIAGNOSIS — Z794 Long term (current) use of insulin: Secondary | ICD-10-CM | POA: Insufficient documentation

## 2022-02-22 DIAGNOSIS — R0682 Tachypnea, not elsewhere classified: Secondary | ICD-10-CM | POA: Insufficient documentation

## 2022-02-22 DIAGNOSIS — Z79899 Other long term (current) drug therapy: Secondary | ICD-10-CM | POA: Diagnosis not present

## 2022-02-22 DIAGNOSIS — I10 Essential (primary) hypertension: Secondary | ICD-10-CM | POA: Insufficient documentation

## 2022-02-22 DIAGNOSIS — R000 Tachycardia, unspecified: Secondary | ICD-10-CM | POA: Diagnosis not present

## 2022-02-22 LAB — URINALYSIS, ROUTINE W REFLEX MICROSCOPIC
Bacteria, UA: NONE SEEN
Bilirubin Urine: NEGATIVE
Glucose, UA: >=500 mg/dL — AB
Hgb urine dipstick: NEGATIVE
Ketones, ur: 80 mg/dL — AB
Leukocytes,Ua: NEGATIVE
Nitrite: NEGATIVE
Protein, ur: NEGATIVE mg/dL
Specific Gravity, Urine: 1.03 (ref 1.005–1.030)
pH: 5 (ref 5.0–8.0)

## 2022-02-22 LAB — BASIC METABOLIC PANEL
Anion gap: 12 (ref 5–15)
BUN: 16 mg/dL (ref 8–23)
CO2: 27 mmol/L (ref 22–32)
Calcium: 8.9 mg/dL (ref 8.9–10.3)
Chloride: 100 mmol/L (ref 98–111)
Creatinine, Ser: 0.74 mg/dL (ref 0.44–1.00)
GFR, Estimated: >60 mL/min (ref >60)
Glucose, Bld: 468 mg/dL — ABNORMAL HIGH (ref 70–99)
Potassium: 3.3 mmol/L — ABNORMAL LOW (ref 3.5–5.1)
Sodium: 139 mmol/L (ref 135–145)

## 2022-02-22 LAB — CBC WITH DIFFERENTIAL/PLATELET
Abs Immature Granulocytes: 0.03 10*3/uL (ref 0.00–0.07)
Basophils Absolute: 0 10*3/uL (ref 0.0–0.1)
Basophils Relative: 0 %
Eosinophils Absolute: 0 10*3/uL (ref 0.0–0.5)
Eosinophils Relative: 0 %
HCT: 41.1 % (ref 36.0–46.0)
Hemoglobin: 12.8 g/dL (ref 12.0–15.0)
Immature Granulocytes: 0 %
Lymphocytes Relative: 6 %
Lymphs Abs: 0.6 10*3/uL — ABNORMAL LOW (ref 0.7–4.0)
MCH: 24.5 pg — ABNORMAL LOW (ref 26.0–34.0)
MCHC: 31.1 g/dL (ref 30.0–36.0)
MCV: 78.6 fL — ABNORMAL LOW (ref 80.0–100.0)
Monocytes Absolute: 0.3 10*3/uL (ref 0.1–1.0)
Monocytes Relative: 3 %
Neutro Abs: 9 10*3/uL — ABNORMAL HIGH (ref 1.7–7.7)
Neutrophils Relative %: 91 %
Platelets: 330 10*3/uL (ref 150–400)
RBC: 5.23 MIL/uL — ABNORMAL HIGH (ref 3.87–5.11)
RDW: 14.1 % (ref 11.5–15.5)
WBC: 9.9 10*3/uL (ref 4.0–10.5)
nRBC: 0 % (ref 0.0–0.2)

## 2022-02-22 LAB — BETA-HYDROXYBUTYRIC ACID: Beta-Hydroxybutyric Acid: 2.76 mmol/L — ABNORMAL HIGH (ref 0.05–0.27)

## 2022-02-22 LAB — BLOOD GAS, VENOUS
Acid-Base Excess: 5.2 mmol/L — ABNORMAL HIGH (ref 0.0–2.0)
Bicarbonate: 29.9 mmol/L — ABNORMAL HIGH (ref 20.0–28.0)
O2 Saturation: 73.1 %
Patient temperature: 37
pCO2, Ven: 43 mmHg — ABNORMAL LOW (ref 44–60)
pH, Ven: 7.45 — ABNORMAL HIGH (ref 7.25–7.43)
pO2, Ven: 39 mmHg (ref 32–45)

## 2022-02-22 LAB — CBG MONITORING, ED
Glucose-Capillary: 419 mg/dL — ABNORMAL HIGH (ref 70–99)
Glucose-Capillary: 390 mg/dL — ABNORMAL HIGH (ref 70–99)
Glucose-Capillary: 267 mg/dL — ABNORMAL HIGH (ref 70–99)

## 2022-02-22 MED ORDER — INSULIN ASPART 100 UNIT/ML IJ SOLN
5.0000 [IU] | Freq: Once | INTRAMUSCULAR | Status: AC
  Administered 2022-02-22: 5 [IU] via SUBCUTANEOUS
  Filled 2022-02-22: qty 0.05

## 2022-02-22 MED ORDER — ONDANSETRON HCL 4 MG/2ML IJ SOLN
4.0000 mg | Freq: Once | INTRAMUSCULAR | Status: AC
  Administered 2022-02-22: 4 mg via INTRAVENOUS
  Filled 2022-02-22: qty 2

## 2022-02-22 MED ORDER — LACTATED RINGERS IV SOLN: INTRAVENOUS | Status: DC

## 2022-02-22 MED ORDER — LACTATED RINGERS IV BOLUS
2000.0000 mL | Freq: Once | INTRAVENOUS | Status: AC
Start: 2022-02-22 — End: 2022-02-22
  Administered 2022-02-22: 2000 mL via INTRAVENOUS

## 2022-02-22 NOTE — ED Triage Notes (Signed)
Pt BIB EMS from home with c/o hyperglycemia and nausea. Pt states she is a diabetic and has not had insulin in 3 weeks due to medicine not being sent to her. A&O x4, ambulatory.  ?BP 142/82 ?HR 100 ?RR 28 ?98% room air ?CBG 560 ?

## 2022-02-22 NOTE — ED Notes (Signed)
Pt aware urine sample needed 

## 2022-02-22 NOTE — ED Provider Notes (Signed)
[facility] DEPT Provider Note   CSN: [id number] Arrival date & time: 02/22/22  0957     History  Chief Complaint  Patient presents with   Hyperglycemia    Angie Cain is a 66 y.o. tearful female with history of type 2 diabetes mellitus with complications and poor compliance, GERD, hypertension, hyperlipidemia, presenting today with complaints of hyperglycemia.  Patient usually gets her insulin through Medicare, but has been unable to obtain it for the past 4 weeks.  Angie Cain has still been taking her Trulicity and metformin.  Complains of nausea and vomiting with increased urinary frequency and vision changes over the last 24 hours.  Increased abdominal pain over the last week, described as constant and severe.  Vision changes categorized as blurry vision.  Characterizes urine color as clear.  Complains of feeling very dry.    Current tobacco use.  The history is provided by the patient and medical records.  Hyperglycemia Associated symptoms: abdominal pain, nausea and vomiting       Home Medications Prior to Admission medications   Medication Sig Start Date End Date Taking? Authorizing Provider  Accu-Chek FastClix Lancets MISC CHECK BLOOD SUGAR 3 TIMES A DAY 06/27/21   Sanjuan Dame, MD  Alcohol Swabs (B-D SINGLE USE SWABS REGULAR) PADS 1 each by Does not apply route 6 (six) times daily. 02/26/20   Chundi, Verne Spurr, MD  ALEVE 220 MG tablet Take 220-440 mg by mouth 2 (two) times daily as needed (for pain).    [provider]  amLODipine (NORVASC) 10 MG tablet Take 1 tablet (10 mg total) by mouth daily. 09/09/21   Maudie Mercury, MD  aspirin EC 81 MG tablet Take 81 mg by mouth daily.    [provider]  atorvastatin (LIPITOR) 40 MG tablet Take 1 tablet (40 mg total) by mouth at bedtime. 09/09/21   Maudie Mercury, MD  Blood Glucose Calibration (ACCU-CHEK AVIVA) SOLN Use to check controls on diabetic glucose meter monthly 02/26/20    Chundi, Verne Spurr, MD  Blood Glucose Monitoring Suppl (ACCU-CHEK AVIVA PLUS) w/Device KIT Check blood sugar up to 3 times a day 02/26/20   Lars Mage, MD  calcium-vitamin D (OSCAL WITH D) 500-200 MG-UNIT tablet Take 1 tablet by mouth 2 (two) times daily. 03/17/19   Lars Mage, MD  cetirizine (ZYRTEC ALLERGY) 10 MG tablet Take 1 tablet (10 mg total) by mouth daily. 02/02/22   Piontek, Junie Panning, MD  diclofenac Sodium (VOLTAREN) 1 % GEL Apply 2 g topically 4 (four) times daily. Patient taking differently: Apply 2 g topically 4 (four) times daily as needed (for pain). 06/12/21   Rehman, Areeg N, DO  Dulaglutide (TRULICITY) 4.13 KG/4.0NU SOPN Inject 0.75 mg into the skin once a week. 02/11/22   Masters, Katie, DO  DULoxetine (CYMBALTA) 20 MG capsule Take 1 capsule (20 mg total) by mouth daily. 01/21/22   Lacinda Axon, MD  famotidine (PEPCID) 20 MG tablet Take 1 tablet (20 mg total) by mouth at bedtime. 02/02/22   Piontek, Junie Panning, MD  glucose blood (ACCU-CHEK GUIDE) test strip Check blood sugar 3 times per day 07/07/21   Lacinda Axon, MD  insulin degludec (TRESIBA) 100 UNIT/ML FlexTouch Pen Inject 24 Units into the skin daily. 02/11/22   Masters, Katie, DO  Insulin Pen Needle 31G X 5 MM MISC USE TO INJECT INSULIN INTO THE SKIN 1 TIME DAILY AT BEDTIME 07/07/21 07/07/22  Lacinda Axon, MD  Insulin Syringe-Needle U-100 31G X 15/64" 0.3 ML MISC Use  to inject insulin one time daily 08/13/17   Annia Belt, MD  latanoprost (XALATAN) 0.005 % ophthalmic solution PLACE 1 DROP INTO BOTH EYES AT BEDTIME. 06/27/21   Sanjuan Dame, MD  losartan (COZAAR) 50 MG tablet TAKE 1 TABLET BY MOUTH ONCE DAILY Patient taking differently: Take 50 mg by mouth at bedtime. 07/07/21 07/07/22  Lacinda Axon, MD  metFORMIN (GLUCOPHAGE XR) 500 MG 24 hr tablet Take 2 tablets (1,000 mg total) by mouth daily with breakfast. 09/09/21 09/09/22  Maudie Mercury, MD  mupirocin ointment (BACTROBAN) 2 % Apply 1 application  topically daily. 02/02/22   Piontek, Junie Panning, MD  timolol (TIMOPTIC) 0.5 % ophthalmic solution INSTILL 1 DROP INTO BOTH EYES EVERY MORNING Patient taking differently: Place 1 drop into both eyes in the morning. 07/17/21 07/17/22  Lacinda Axon, MD      Allergies    Bupropion, Jardiance [empagliflozin], Metronidazole, Shellfish allergy, and Shrimp extract allergy skin test    Review of Systems   Review of Systems  Eyes:  Positive for visual disturbance.  Gastrointestinal:  Positive for abdominal pain, nausea and vomiting.  Genitourinary:  Positive for frequency.   Physical Exam Updated Vital Signs BP 135/87    Pulse 95    Temp 97.8 F (36.6 C) (Oral)    Resp 16    Ht '5\' 5"'  (1.651 m)    Wt 58.9 kg    SpO2 100%    BMI 21.62 kg/m  Physical Exam Vitals and nursing note reviewed.  Constitutional:      General: Angie Cain is not in acute distress.    Appearance: Angie Cain is well-developed. Angie Cain is ill-appearing. Angie Cain is not diaphoretic.  HENT:     Head: Normocephalic and atraumatic.     Mouth/Throat:     Mouth: Mucous membranes are dry.     Pharynx: Oropharynx is clear.  Eyes:     Conjunctiva/sclera: Conjunctivae normal.  Cardiovascular:     Rate and Rhythm: Regular rhythm. Tachycardia present.     Pulses: Normal pulses.          Radial pulses are 2+ on the right side and 2+ on the left side.       Dorsalis pedis pulses are 2+ on the right side and 2+ on the left side.     Heart sounds: Normal heart sounds. No murmur heard. Pulmonary:     Effort: Pulmonary effort is normal. Tachypnea present. No accessory muscle usage.     Breath sounds: Normal breath sounds.  Abdominal:     Palpations: Abdomen is soft.     Tenderness: There is abdominal tenderness.  Musculoskeletal:        General: No swelling.     Cervical back: Neck supple.     Right lower leg: No edema.     Left lower leg: No edema.  Skin:    General: Skin is warm and dry.     Capillary Refill: Capillary refill takes less than 2  seconds.  Neurological:     Mental Status: Angie Cain is alert and oriented to person, place, and time.  Psychiatric:        Mood and Affect: Mood normal. Affect is tearful.    ED Results / Procedures / Treatments   Labs (all labs ordered are listed, but only abnormal results are displayed) Labs Reviewed  BASIC METABOLIC PANEL - Abnormal; Notable for the following components:      Result Value   Potassium 3.3 (*)    Glucose, Bld 468 (*)  All other components within normal limits  CBC WITH DIFFERENTIAL/PLATELET - Abnormal; Notable for the following components:   RBC 5.23 (*)    MCV 78.6 (*)    MCH 24.5 (*)    Neutro Abs 9.0 (*)    Lymphs Abs 0.6 (*)    All other components within normal limits  URINALYSIS, ROUTINE W REFLEX MICROSCOPIC - Abnormal; Notable for the following components:   Color, Urine STRAW (*)    Glucose, UA >=500 (*)    Ketones, ur 80 (*)    All other components within normal limits  BETA-HYDROXYBUTYRIC ACID - Abnormal; Notable for the following components:   Beta-Hydroxybutyric Acid 2.76 (*)    All other components within normal limits  BLOOD GAS, VENOUS - Abnormal; Notable for the following components:   pH, Ven 7.45 (*)    pCO2, Ven 43 (*)    Bicarbonate 29.9 (*)    Acid-Base Excess 5.2 (*)    All other components within normal limits  CBG MONITORING, ED - Abnormal; Notable for the following components:   Glucose-Capillary 419 (*)    All other components within normal limits  CBG MONITORING, ED - Abnormal; Notable for the following components:   Glucose-Capillary 390 (*)    All other components within normal limits  CBG MONITORING, ED - Abnormal; Notable for the following components:   Glucose-Capillary 267 (*)    All other components within normal limits    EKG None  Radiology No results found.  Procedures Procedures    Medications Ordered in ED Medications  ondansetron (ZOFRAN) injection 4 mg (4 mg Intravenous Given 02/22/22 1113)  lactated  ringers bolus 2,000 mL (2,000 mLs Intravenous Bolus 02/22/22 1201)  insulin aspart (novoLOG) injection 5 Units (5 Units Subcutaneous Given 02/22/22 1341)    ED Course/ Medical Decision Making/ A&P                           Medical Decision Making Amount and/or Complexity of Data Reviewed Labs: ordered.  Risk Prescription drug management.   66 y.o. female presents to the ED for concern of Hyperglycemia.  This involves an extensive number of treatment options, and is a complaint that carries with it a high risk of complications and morbidity.  The differential diagnosis includes DKA, HHS, hyperglycemia  Comorbidities that complicate the patient evaluation include diabetes mellitus type 2  Additional history obtained from internal/external records available via epic  Interpretation: I ordered, and personally interpreted labs.  The pertinent results include:  pH Ven of 7.45, which does not indicate acidosis.  Bicarb mildly elevated at 29.9.  pO2 39 unremarkable.  No evidence of anemia, and WBC not elevated which does not suggest inflammation/infection.  Beta-hydroxy elevated at 2.76, but not greater than 3 so not suspicious of DKA.  Potassium mildly decreased at 3.3.  Glucose elevated at 468, recent glucose levels of 148.    I ordered and personally interpreted the EKG twelve-lead, which showed mildly prolonged QT interval and non-specific T wave changes, but without evidence of arrhythmia, bundle branch block, or ST segment changes.  Rhythm interpreted as sinus tachycardia  Intervention: I ordered medication including IV fluids, antiemetics, and pain medication for purpose of symptomatic hyperglycemia management.  Reevaluation of the patient after these medicines showed that the patient had mild improvement, but still feels nauseous and dehydrated with abdominal pain.    I also ordered insulin for purpose of blood glucose management.  Reevaluation of the patient  after this showed that the  patient remarkably improved.  I have reviewed the patients home medicines and have made adjustments as needed  ED Course: Patient presented tearful and complaining of significant abdominal pain, nausea, and vomiting.  Also notes polyuria that is clear in color.  Feels dehydrated.  History of poorly controlled diabetes mellitus.  States Angie Cain's been unable to pick up her prescription form the pharmacy because they will not provide it for her, therefore Angie Cain's been without it for 4 weeks.  Blood glucose rose from 169 to 468 in that time.  Has been able to take her trulicity and metformin but states this has not helped enough.  Labs indicated above do not signify diabetic ketoacidosis or HHS.  Patient is hyperglycemic and symptomatic, with notable dehydration.  Provided fluids and antiemetic medication.  Appreciated some improvement.  Contacted internal medicine provider who prescribes patient's Tyler Aas for assistance.  Provided insulin and saw significant improvement.  Patient feels ready to be discharged.  Emergency department workup does not suggest an emergent condition requiring admission or immediate intervention beyond what has been performed at this time.  The patient is safe for discharge and has been instructed to return immediately for worsening symptoms, change in symptoms or any other concerns  Social Determinants of Health include Medicare  Disposition: I discussed the patient and their case with my attending, Dr. Kathrynn Humble, who agreed with the proposed treatment course.  After consideration of the diagnostic results and the patient's response to treatment, I feel that the patent would benefit from outpatient follow up with PCP to rectify diabetes medication availability.  Discussed course of treatment thoroughly with the patient and Angie Cain demonstrated understanding.  Patient in agreement and has no further questions.  This chart was dictated using voice recognition software.  Despite best efforts to  proofread,  errors can occur which can change the documentation meaning.          Final Clinical Impression(s) / ED Diagnoses Final diagnoses:  Hyperglycemia    Rx / DC Orders ED Discharge Orders     None         Prince Rome, Hershal Coria 21/03/12 1551    Varney Biles, MD 02/23/22 1048

## 2022-02-22 NOTE — Discharge Instructions (Addendum)
Return to the ED for new or worsening symptoms as discussed. ?

## 2022-02-23 ENCOUNTER — Telehealth: Payer: Self-pay | Admitting: Internal Medicine

## 2022-02-23 ENCOUNTER — Other Ambulatory Visit (HOSPITAL_COMMUNITY): Payer: Self-pay

## 2022-02-23 ENCOUNTER — Telehealth: Payer: Self-pay

## 2022-02-23 NOTE — Telephone Encounter (Signed)
I called patient this morning after ED visit 2/2 to hyperglycemia. She has been out of [place] for several weeks. This medication was refilled 3/1, but she was unable to pick up from [facility]. Their records showed that she picked up [place] at a separate pharmacy 01/21/22, but they are unable to see which pharmacy. No records show that it was sent to separate pharmacy from [facility]. Felicia with [facility] will place call to insurance to clarify discrepancy. I let patient know that 5 pens of Tresiba 100units have been set aside and she could pick those up today. ? ?Medication Samples have been provided to the patient. ? ?Drug name: Tyler Aas       Strength: 100 units        Qty: 5  LOT: [id number]  Exp.Date: 08/14/2023 ? ?Dosing instructions: Inject 24 units nightly ? ?The patient has been instructed regarding the correct time, dose, and frequency of taking this medication, including desired effects and most common side effects.  ? ?Joellen Jersey Americus Perkey ?8:48 AM ?02/23/2022  ?

## 2022-02-23 NOTE — Telephone Encounter (Signed)
-----   Message from Marzetta Board, DPM sent at 02/10/2022  7:22 AM EST ----- ?Please inform patient recent ABIs are slightly increased compared to study of 2019. I do recommend she re-establish care with [facility] for annual assessment. Her last visit with the practice appears to be in 2013, and annual follow up was recommeded at that time. She is overdue for Vascular Follow up. ? ?Thanks! ?Dr. Darnell Level. ?

## 2022-02-23 NOTE — Telephone Encounter (Signed)
Called patient to advise her of Dr. Heber [place] message regarding her ABI results. Patient verbalized understanding and stated she will re-establish care with [facility] as soon as possible. All questions answered for patient at this time. Advised patient to call the office with any questions, comments, or concerns.  ?

## 2022-03-06 ENCOUNTER — Ambulatory Visit (INDEPENDENT_AMBULATORY_CARE_PROVIDER_SITE_OTHER): Payer: Medicare Other | Admitting: Podiatry

## 2022-03-06 ENCOUNTER — Other Ambulatory Visit: Payer: Self-pay

## 2022-03-06 ENCOUNTER — Encounter: Payer: Self-pay | Admitting: Podiatry

## 2022-03-06 DIAGNOSIS — E1151 Type 2 diabetes mellitus with diabetic peripheral angiopathy without gangrene: Secondary | ICD-10-CM | POA: Diagnosis not present

## 2022-03-06 DIAGNOSIS — F17200 Nicotine dependence, unspecified, uncomplicated: Secondary | ICD-10-CM

## 2022-03-06 DIAGNOSIS — Z794 Long term (current) use of insulin: Secondary | ICD-10-CM

## 2022-03-10 ENCOUNTER — Other Ambulatory Visit (HOSPITAL_COMMUNITY): Payer: Self-pay

## 2022-03-10 ENCOUNTER — Other Ambulatory Visit: Payer: Self-pay | Admitting: Internal Medicine

## 2022-03-10 DIAGNOSIS — E119 Type 2 diabetes mellitus without complications: Secondary | ICD-10-CM

## 2022-03-10 DIAGNOSIS — IMO0002 Reserved for concepts with insufficient information to code with codable children: Secondary | ICD-10-CM

## 2022-03-11 ENCOUNTER — Telehealth: Payer: Self-pay

## 2022-03-11 ENCOUNTER — Other Ambulatory Visit: Payer: Self-pay | Admitting: Student

## 2022-03-11 ENCOUNTER — Other Ambulatory Visit (HOSPITAL_COMMUNITY): Payer: Self-pay

## 2022-03-11 ENCOUNTER — Other Ambulatory Visit: Payer: Self-pay

## 2022-03-11 DIAGNOSIS — E119 Type 2 diabetes mellitus without complications: Secondary | ICD-10-CM

## 2022-03-11 MED ORDER — INSULIN PEN NEEDLE 32G X 4 MM MISC
1 refills | Status: DC
  Filled 2022-03-11: qty 100, 100d supply, fill #0

## 2022-03-11 MED ORDER — METFORMIN HCL ER 500 MG PO TB24
1000.0000 mg | ORAL_TABLET | Freq: Every day | ORAL | 11 refills | Status: DC
  Filled 2022-03-11: qty 60, 30d supply, fill #0

## 2022-03-11 MED ORDER — INSULIN PEN NEEDLE 31G X 5 MM MISC
1 refills | Status: DC
  Filled 2022-03-11: qty 100, 100d supply, fill #0

## 2022-03-11 NOTE — Progress Notes (Signed)
Discontinued duplicated metformin.  ?

## 2022-03-11 NOTE — Addendum Note (Signed)
Addended byLinwood Dibbles on: 03/11/2022 03:02 PM ? ? Modules accepted: Orders ? ?

## 2022-03-11 NOTE — Telephone Encounter (Signed)
?  Insulin Pen Needle 31G X 5 MM MISC  ?[facility] ?

## 2022-03-12 ENCOUNTER — Other Ambulatory Visit (HOSPITAL_COMMUNITY): Payer: Self-pay

## 2022-03-12 NOTE — Progress Notes (Signed)
?Subjective:  ?Patient ID: Angie Cain, female    DOB: [birth year],  MRN: [id number] ? ?Angie Cain presents to clinic today for follow up review of ABI study b/l lower extremities. ? ?Patient has h/o diabetes, current everyday smoker and PAD. She has seen Vascular in the past; last visit was 2013. She was supposed to follow up in one year. She states she has a lot going on at home and is stressed. ? ?New problem(s): None.  ? ?PCP is Lacinda Axon, MD , and last visit was January 21, 2022. ? ?Allergies  ?Allergen Reactions  ? Bupropion Hives and Swelling  ? Jardiance [Empagliflozin] Other (See Comments)  ?  Caused a UTI  ? Metronidazole Itching  ? Shellfish Allergy Itching and Other (See Comments)  ?  Mouth irritation/itching  ? Shrimp Extract Allergy Skin Test Itching  ? ? ?Review of Systems: Negative except as noted in the HPI. ? ?Objective: No changes noted in today's physical examination. ? ?Constitutional [name] is a pleasant 66 y.o. African American female, WD, WN in NAD. AAO x 3.   ?Vascular CFT <3 seconds b/l LE. Faintly palpable DP pulses b/l LE. Diminished PT pulse(s) b/l LE.  ?Neurologic Normal speech. Oriented to person, place, and time. Protective sensation intact 5/5 intact bilaterally with 10g monofilament b/l. Vibratory sensation intact b/l.  ?Dermatologic Pedal skin thin and atrophic b/l LE. No open wounds b/l LE. No interdigital macerations noted b/l LE. Toenails 1-5 b/l recently debrided.  ?Orthopedic: Muscle strength 5/5 to all lower extremity muscle groups bilaterally. HAV with bunion deformity noted b/l LE. Hammertoe deformity noted 2-5 b/l.  ? ?Radiographs: None ? ?LOWER EXTREMITY DOPPLER STUDY  ? ?Patient Name:  Angie Cain  Date of Exam:   02/09/2022  ?Medical Rec #: [id number]         Accession #:    [id number]  ?Date of Birth: 1956/08/13         Patient Gender: F  ?Patient Age:   66 years  ?Exam Location:  [facility]  ?Procedure:      VAS [place]  ABI WITH/WO TBI  ?Referring Phys: Acquanetta Sit  ? ? ?---------------------------------------------------------------------------  ?-----  ?   ?Indications: Claudication, and peripheral artery disease.  ? ?High Risk Factors: Hyperlipidemia, Diabetes, past history of smoking.  ? ?Performing Technologist: Delorise Shiner RVT  ? ?Examination Guidelines: A complete evaluation includes at minimum, Doppler  ?waveform signals and systolic blood pressure reading at the level of  ?bilateral brachial, anterior tibial, and posterior tibial arteries, when vessel  ?segments are accessible. Bilateral testing is considered an integral part of a  ?complete examination. Photoelectric Plethysmograph (PPG) waveforms and toe systolic  ?pressure readings are included as required and additional duplex testing  ?as needed. Limited examinations for reoccurring indications may be performed  ?as noted.  ? ?   ?ABI Findings:  ?+---------+------------------+-----+----------+--------+  ?Right    Rt Pressure (mmHg)IndexWaveform  Comment   ?+---------+------------------+-----+----------+--------+  ?Brachial 149                                        ?+---------+------------------+-----+----------+--------+  ?PTA      100               0.67 monophasic          ?+---------+------------------+-----+----------+--------+  ?DP       99  0.66 monophasic          ?+---------+------------------+-----+----------+--------+  ?Great Toe48                0.32                     ?+---------+------------------+-----+----------+--------+  ? ?+---------+------------------+-----+----------+-------+  ?Left     Lt Pressure (mmHg)IndexWaveform  Comment  ?+---------+------------------+-----+----------+-------+  ?Brachial 149                                       ?+---------+------------------+-----+----------+-------+  ?PTA      107               0.72 monophasic          ?+---------+------------------+-----+----------+-------+  ?DP       110               0.74 monophasic         ?+---------+------------------+-----+----------+-------+  ?Great Toe56                0.38                    ?+---------+------------------+-----+----------+-------+  ? ?+-------+-----------+-----------+------------+------------+  ?ABI/TBIToday's ABIToday's TBIPrevious ABIPrevious TBI  ?+-------+-----------+-----------+------------+------------+  ?Right  0.67       0.32       0.57        0.58          ?+-------+-----------+-----------+------------+------------+  ?Left   0.74       0.38       0.58        0.65          ?+-------+-----------+-----------+------------+------------+  ? ?Bilateral ABIs appear increased compared to prior study on 02/13/2018.  ?   ?Summary:  ?   ?*See table(s) above for measurements and observations.  ?   ?Electronically signed by Harold Barban MD on 02/09/2022 at 6:30:59 PM.  ? ?Final   ? ?Assessment/Plan: ?1. Tobacco use disorder   ?2. Type 2 diabetes mellitus with diabetic peripheral angiopathy without gangrene, with long-term current use of insulin ([place])   ?  ?-Patient was evaluated and treated. All patient's and/or POA's questions/concerns answered on today's visit. ?-Reviewed ABI results. Recommend re-establishing care with [facility] for annuall evaluation. ?-Patient/POA to call should there be question/concern in the interim.  ? ?Return in about 3 weeks (around 03/27/2022). ? ?Marzetta Board, DPM  ?

## 2022-03-17 ENCOUNTER — Other Ambulatory Visit (HOSPITAL_COMMUNITY): Payer: Self-pay

## 2022-03-18 ENCOUNTER — Ambulatory Visit (INDEPENDENT_AMBULATORY_CARE_PROVIDER_SITE_OTHER): Payer: Medicare Other | Admitting: Internal Medicine

## 2022-03-18 ENCOUNTER — Encounter: Payer: Self-pay | Admitting: Internal Medicine

## 2022-03-18 ENCOUNTER — Other Ambulatory Visit (HOSPITAL_COMMUNITY): Payer: Self-pay

## 2022-03-18 VITALS — BP 132/71 | HR 76 | Temp 98.4°F | Ht 65.0 in | Wt 128.9 lb

## 2022-03-18 DIAGNOSIS — E119 Type 2 diabetes mellitus without complications: Secondary | ICD-10-CM

## 2022-03-18 DIAGNOSIS — R1030 Lower abdominal pain, unspecified: Secondary | ICD-10-CM

## 2022-03-18 DIAGNOSIS — Z794 Long term (current) use of insulin: Secondary | ICD-10-CM | POA: Diagnosis not present

## 2022-03-18 DIAGNOSIS — Z95828 Presence of other vascular implants and grafts: Secondary | ICD-10-CM | POA: Diagnosis not present

## 2022-03-18 DIAGNOSIS — R208 Other disturbances of skin sensation: Secondary | ICD-10-CM | POA: Diagnosis not present

## 2022-03-18 DIAGNOSIS — M545 Low back pain, unspecified: Secondary | ICD-10-CM | POA: Diagnosis not present

## 2022-03-18 DIAGNOSIS — E1142 Type 2 diabetes mellitus with diabetic polyneuropathy: Secondary | ICD-10-CM | POA: Diagnosis not present

## 2022-03-18 DIAGNOSIS — G8929 Other chronic pain: Secondary | ICD-10-CM | POA: Diagnosis not present

## 2022-03-18 MED ORDER — DULOXETINE HCL 20 MG PO CPEP
20.0000 mg | ORAL_CAPSULE | Freq: Two times a day (BID) | ORAL | 2 refills | Status: DC
Start: 2022-03-18 — End: 2022-06-01
  Filled 2022-03-18: qty 60, 30d supply, fill #0

## 2022-03-18 MED ORDER — "INSULIN SYRINGE-NEEDLE U-100 31G X 5/16"" 0.5 ML MISC"
1.0000 | Freq: Every day | 12 refills | Status: DC
  Filled 2022-03-18: qty 100, 90d supply, fill #0

## 2022-03-18 MED ORDER — INSULIN PEN NEEDLE 32G X 4 MM MISC: 1 refills | Status: DC

## 2022-03-18 NOTE — Patient Instructions (Signed)
Thank you, Ms.Hollee Voytko for allowing [place] to provide your care today. Today we discussed: ? ?Groin numbness/tingling: Start taking cymbalta 20 mg twice a day. I have also referred you to the vascular surgeons to see if this is related to your previous surgery ? ?I have ordered the following labs for you: ? ?Lab Orders  ?No laboratory test(s) ordered today  ?  ? ?Tests ordered today: ? ?none ? ?Referrals ordered today:  ? ? ?Referral Orders    ?     Ambulatory referral to Vascular Surgery     ? ?I have ordered the following medication/changed the following medications:  ? ?Stop the following medications: ?Medications Discontinued During This Encounter  ?Medication Reason  ? Insulin Syringe-Needle U-100 31G X 15/64" 0.3 ML MISC Reorder  ? DULoxetine (CYMBALTA) 20 MG capsule Reorder  ? Insulin Pen Needle 32G X 4 MM MISC Reorder  ?  ? ?Start the following medications: ?Meds ordered this encounter  ?Medications  ? Insulin Pen Needle 32G X 4 MM MISC  ?  Sig: Use to inject insulin once daily at bedtime  ?  Dispense:  100 each  ?  Refill:  1  ? Insulin Syringe-Needle U-100 31G X 15/64" 0.3 ML MISC  ?  Sig: Use to inject insulin one time daily  ?  Dispense:  100 each  ?  Refill:  12  ? DULoxetine (CYMBALTA) 20 MG capsule  ?  Sig: Take 1 capsule (20 mg total) by mouth 2 (two) times daily.  ?  Dispense:  30 capsule  ?  Refill:  2  ?  IM program  ?  ? ?Follow up: 2 months PCP follow up ? ? ?Remember: If you do not hear from the vascular surgeons in ~ 1-2 weeks, please call us to let us know ? ?Should you have any questions or concerns please call the internal medicine clinic at [phone].   ? ? ?Buddy Duty, D.O. ?[place] ? ? ?

## 2022-03-18 NOTE — Progress Notes (Signed)
? ?  CC: Back and groin pain ? ?HPI: ? ?[name] is a 66 y.o. female with HTN, DM, HLD, and GERD who presents to the [facility] with back and groin pain. Please see problem-based list for further details, assessments, and plans. ? ? ? ?Past Medical History:  ?Diagnosis Date  ? Allergy   ? Anxiety   ? occasional  ? Arthritis   ? Atherosclerotic peripheral vascular disease with intermittent claudication ([place])   ? s/p fem-fem bypass 2011. ABI 07/2012 0.5 R 0.65 L  ? Diabetes mellitus without complication ([place])   ? GERD (gastroesophageal reflux disease)   ? Hyperlipidemia   ? Hypertension   ? IDDM (insulin dependent diabetes mellitus) 07/21/2007  ? July 2011: Femoral-femoral bypass  ABI 08/11/2012 notable for 0.4-0.59 on right ankle suggestive of severe arterial occlusive disease at rest and 0.60-0.7 on left ankle suggestive of moderate arterial occlusive disease at rest.   ? Iron deficiency anemia 05/08/2017  ? Low back pain 12/18/2013  ? Personal history of colonic polyps - adenomas 03/07/2014  ? Rotator cuff impingement syndrome of left shoulder 03/23/2016  ? Tobacco abuse   ? ?Review of Systems:  Review of Systems  ?Constitutional:  Negative for chills, fever and weight loss.  ?HENT: Negative.    ?Respiratory:  Negative for cough and shortness of breath.   ?Cardiovascular:  Negative for chest pain.  ?Genitourinary: Negative.   ?Musculoskeletal:  Positive for back pain and myalgias.  ?Neurological:  Positive for tingling. Negative for dizziness and headaches.  ? ? ?Physical Exam: ? ?Vitals:  ? 03/18/22 1040  ?BP: 132/71  ?Pulse: 76  ?Temp: 98.4 ?F (36.9 ?C)  ?TempSrc: Oral  ?SpO2: 100%  ?Weight: 128 lb 14.4 oz (58.5 kg)  ?Height: '5\' 5"'$  (1.651 m)  ? ?General: Pleasant, well-appearing elderly female. No acute distress. ?CV: RRR. No murmurs, rubs, or gallops.  ?Pulmonary: Lungs CTAB. Normal effort.  ?Abdominal: Soft, nontender, nondistended. No hernias appreciated.  ?Extremities: Normal ROM. 5/5 strength bilateral lower  extremities. 1+ DP pulses bilaterally. ?Skin: Warm and dry.  ?Neuro: A&Ox3. Moves all extremities. Normal sensation. No focal deficit. Strength 5/5 bilateral lower extremities. 2/4 Patellar and achilles reflexes. Negative straight leg test.  ?Psych: Normal mood and affect ? ? ?Assessment & Plan:  ? ?See Encounters Tab for problem based charting. ? ?Patient discussed with Dr. Jimmye Norman ? ?

## 2022-03-18 NOTE — Assessment & Plan Note (Addendum)
Patient has a history of chronic low back pain. She is very active and is able to walk without any issues. She has no personal history of malignancy, no history of IV drug use, and no history of any recent falls or trauma. Also denies any fevers, chills, weight loss, urinary/fecal incontinence, or numbness/tingling down her legs. On physical exam, strength was 5/5 in bilateral lower extremities, negative straight leg test, normal sensation, and 2/4 patellar and achilles reflexes - overall unremarkable exam.  ? ?She takes naproxen prn at home for this and is advised to continue to do so, while keeping up with stretching and exercising. Patient also advised to use a heating pad as needed to help with her pain.  ?

## 2022-03-18 NOTE — Assessment & Plan Note (Addendum)
Patient has a history of a fem-fem bypass back in 2011 with vascular surgery. She has not followed with the vascular surgeons in many years but notes that she gets numbness/tingling in her bilateral groin region intermittently. She is currently having a flare now and the numbness is very bothersome to her. Her numbness does not radiate anywhere, specifically it does not radiate down her legs.  ? ?On exam, patient has a negative straight leg test. Patellar and achilles reflexes are 2/4. Sensation is intact and strength is 5/5 in bilateral lower extremities. DP pulses are 1+. No hernias are palpated in her inguinal region, as well. I suspect that she may have sustained some nerve damage during her bypass surgery many years ago. Will refer the patient back to vascular surgery to follow up with this, to see if there is any intervention they can do. Additionally, she was prescribed cymbalta 20 mg daily for neuropathy, however, she has not taken this medicine. Recommended she take this medicine (will increase to 20 mg bid) to see if that gives her neuropathic symptoms any relief.  ? ?Plan: ?- Referral to vascular surgery ?- Cymbalta 20 mg bid, can likely increase dose  ?

## 2022-03-18 NOTE — Progress Notes (Signed)
Internal Medicine Clinic Attending ° °Case discussed with Dr. Atway  At the time of the visit.  We reviewed the resident’s history and exam and pertinent patient test results.  I agree with the assessment, diagnosis, and plan of care documented in the resident’s note.  °

## 2022-03-19 NOTE — Assessment & Plan Note (Addendum)
A1c 10 in February. She had gotten refills of her Tyler Aas (68L daily) and Trulicity 2.75 mg weekly at her last visit. Refilled pen needles today.  ? ?Plan: ?- Continue Tresiba 17G daily, Trulicity 0.17 mg weekly, and metfomin 1 g bid ?

## 2022-03-20 ENCOUNTER — Other Ambulatory Visit (HOSPITAL_COMMUNITY): Payer: Self-pay

## 2022-03-25 ENCOUNTER — Ambulatory Visit (INDEPENDENT_AMBULATORY_CARE_PROVIDER_SITE_OTHER): Payer: Self-pay | Admitting: Podiatry

## 2022-03-25 DIAGNOSIS — Z91199 Patient's noncompliance with other medical treatment and regimen due to unspecified reason: Secondary | ICD-10-CM

## 2022-03-31 NOTE — Progress Notes (Signed)
   Complete physical exam  Patient: Angie Cain   DOB: 10/03/2001   66 y.o. Female  MRN: [id number]  Subjective:    No chief complaint on file.   Angie Cain is a 66 y.o. female who presents today for a complete physical exam. She reports consuming a {diet types:17450} diet. {types:19826} She generally feels {DESC; WELL/FAIRLY WELL/POORLY:18703}. She reports sleeping {DESC; WELL/FAIRLY WELL/POORLY:18703}. She {does/does not:200015} have additional problems to discuss today.    Most recent fall risk assessment:    06/10/2022   10:42 AM  Fall Risk   Falls in the past year? 0  Number falls in past yr: 0  Injury with Fall? 0  Risk for fall due to : No Fall Risks  Follow up Falls evaluation completed     Most recent depression screenings:    06/10/2022   10:42 AM 05/01/2021   10:46 AM  PHQ 2/9 Scores  PHQ - 2 Score 0 0  PHQ- 9 Score 5     {VISON DENTAL STD PSA (Optional):27386}  {History (Optional):23778}  Patient Care Team: Jessup, Joy, NP as PCP - General (Nurse Practitioner)   Outpatient Medications Prior to Visit  Medication Sig   fluticasone (FLONASE) 50 MCG/ACT nasal spray Place 2 sprays into both nostrils in the morning and at bedtime. After 7 days, reduce to once daily.   norgestimate-ethinyl estradiol (SPRINTEC 28) 0.25-35 MG-MCG tablet Take 1 tablet by mouth daily.   Nystatin POWD Apply liberally to affected area 2 times per day   spironolactone (ALDACTONE) 100 MG tablet Take 1 tablet (100 mg total) by mouth daily.   No facility-administered medications prior to visit.    ROS        Objective:     There were no vitals taken for this visit. {Vitals History (Optional):23777}  Physical Exam   No results found for any visits on 07/16/22. {Show previous labs (optional):23779}    Assessment & Plan:    Routine Health Maintenance and Physical Exam  Immunization History  Administered Date(s) Administered   DTaP 12/17/1999, 02/12/2000,  04/22/2000, 01/06/2001, 07/22/2004   Hepatitis A 05/18/2008, 05/24/2009   Hepatitis B 10/04/1999, 11/11/1999, 04/22/2000   HiB (PRP-OMP) 12/17/1999, 02/12/2000, 04/22/2000, 01/06/2001   IPV 12/17/1999, 02/12/2000, 10/11/2000, 07/22/2004   Influenza,inj,Quad PF,6+ Mos 08/24/2014   Influenza-Unspecified 11/23/2012   MMR 10/11/2001, 07/22/2004   Meningococcal Polysaccharide 05/23/2012   Pneumococcal Conjugate-13 01/06/2001   Pneumococcal-Unspecified 04/22/2000, 07/06/2000   Tdap 05/23/2012   Varicella 10/11/2000, 05/18/2008    Health Maintenance  Topic Date Due   HIV Screening  Never done   Hepatitis C Screening  Never done   INFLUENZA VACCINE  07/14/2022   PAP-Cervical Cytology Screening  07/16/2022 (Originally 10/02/2020)   PAP SMEAR-Modifier  07/16/2022 (Originally 10/02/2020)   TETANUS/TDAP  07/16/2022 (Originally 05/23/2022)   HPV VACCINES  Discontinued   COVID-19 Vaccine  Discontinued    Discussed health benefits of physical activity, and encouraged her to engage in regular exercise appropriate for her age and condition.  Problem List Items Addressed This Visit   None Visit Diagnoses     Annual physical exam    -  Primary   Cervical cancer screening       Need for Tdap vaccination          No follow-ups on file.     Joy Jessup, NP   

## 2022-04-10 ENCOUNTER — Encounter: Payer: Self-pay | Admitting: Surgery

## 2022-04-10 ENCOUNTER — Ambulatory Visit (INDEPENDENT_AMBULATORY_CARE_PROVIDER_SITE_OTHER): Payer: Medicare Other | Admitting: Surgery

## 2022-04-10 VITALS — BP 130/71 | HR 81 | Temp 98.0°F | Resp 20 | Ht 65.0 in | Wt 130.0 lb

## 2022-04-10 DIAGNOSIS — I70213 Atherosclerosis of native arteries of extremities with intermittent claudication, bilateral legs: Secondary | ICD-10-CM

## 2022-04-10 NOTE — Progress Notes (Signed)
? ?[facility] [place] ? ?Patient name: Angie Cain MRN: [id number] DOB: Sep 28, 1956 Sex: female ? ? ?REQUESTING PROVIDER:  ? ? Joni Reining ? ? ?REASON FOR CONSULT:  ?  ?PAD ? ?HISTORY OF PRESENT ILLNESS:  ? ?Angie Cain is a 66 y.o. female, who is a former patient of Dr. Oneida Alar.  She is status post femoral-femoral bypass graft in 2011 for claudication.  The last time he saw her in 2013 she did continue to have cramping in her calves after walking up a hill.  She was not having rest pain or nonhealing wounds.  She was still smoking. ? ?She states that her walking is more limited by shortness of breath than her legs.  Her biggest concern is pins-and-needles and tingling in her hands and feet.  She will also occasionally get a shooting pain up the back of her right leg.  She is a diabetic with history of smoking.  She takes a statin however triglyceridemia.  She is on an ARB for hypertension ? ?PAST MEDICAL HISTORY  ? ? ?Past Medical History:  ?Diagnosis Date  ? Allergy   ? Anxiety   ? occasional  ? Arthritis   ? Atherosclerotic peripheral vascular disease with intermittent claudication ([place])   ? s/p fem-fem bypass 2011. ABI 07/2012 0.5 R 0.65 L  ? Diabetes mellitus without complication ([place])   ? GERD (gastroesophageal reflux disease)   ? Hyperlipidemia   ? Hypertension   ? IDDM (insulin dependent diabetes mellitus) 07/21/2007  ? July 2011: Femoral-femoral bypass  ABI 08/11/2012 notable for 0.4-0.59 on right ankle suggestive of severe arterial occlusive disease at rest and 0.60-0.7 on left ankle suggestive of moderate arterial occlusive disease at rest.   ? Iron deficiency anemia 05/08/2017  ? Low back pain 12/18/2013  ? Personal history of colonic polyps - adenomas 03/07/2014  ? Rotator cuff impingement syndrome of left shoulder 03/23/2016  ? Tobacco abuse   ? ? ? ?FAMILY HISTORY  ? ?Family History  ?Problem Relation Age of Onset  ? Diabetes Mother   ? Diabetes  Father   ? Diabetes Sister   ? Hypertension Sister   ? Diabetes Brother   ? Hypertension Brother   ? Diabetes Son   ? Diabetes Brother   ? Diabetes Brother   ? Diabetes Brother   ? Diabetes Brother   ? Diabetes Brother   ? Colon cancer Neg Hx   ? Esophageal cancer Neg Hx   ? Stomach cancer Neg Hx   ? Rectal cancer Neg Hx   ? Breast cancer Neg Hx   ? ? ?SOCIAL HISTORY:  ? ?Social History  ? ?Socioeconomic History  ? Marital status: Married  ?  Spouse name: Not on file  ? Number of children: Not on file  ? Years of education: Not on file  ? Highest education level: Not on file  ?Occupational History  ? Not on file  ?Tobacco Use  ? Smoking status: Every Day  ?  Packs/day: 0.25  ?  Types: Cigarettes  ? Smokeless tobacco: Never  ? Tobacco comments:  ?  Smokes 4 cigs/day.  ?Vaping Use  ? Vaping Use: Never used  ?Substance and Sexual Activity  ? Alcohol use: Yes  ?  Alcohol/week: 1.0 - 2.0 standard drink  ?  Types: 1 - 2 Glasses of wine per week  ?  Comment: weekly  ? Drug use: No  ? Sexual activity: Yes  ?  Partners: Male  ?Other Topics  Concern  ? Not on file  ?Social History Narrative  ? Current Social History 11/22/2020    ?   ? Patient lives with spouse in an apartment which is 1 story. There are no steps up to the entrance.   ?   ? Patient's method of transportation is personal car.  ?   ? The highest level of education was high school diploma.  ?   ? The patient currently disabled.  ?   ? Identified important Relationships are Husband and sons   ?   ? Pets : None  ?    ? Interests / Fun: Arts and crafts, music, reading   ?   ? Current Stressors: depression   ?   ? Religious / Personal Beliefs: Christian, non-denominational   ? ?Social Determinants of Health  ? ?Financial Resource Strain: Not on file  ?Food Insecurity: Not on file  ?Transportation Needs: Not on file  ?Physical Activity: Not on file  ?Stress: Not on file  ?Social Connections: Not on file  ?Intimate Partner Violence: Not on file  ? ? ?ALLERGIES:   ? ? ?Allergies  ?Allergen Reactions  ? Bupropion Hives and Swelling  ? Jardiance [Empagliflozin] Other (See Comments)  ?  Caused a UTI  ? Metronidazole Itching  ? Shellfish Allergy Itching and Other (See Comments)  ?  Mouth irritation/itching  ? Shrimp Extract Allergy Skin Test Itching  ? ? ?CURRENT MEDICATIONS:  ? ? ?Current Outpatient Medications  ?Medication Sig Dispense Refill  ? Accu-Chek FastClix Lancets MISC CHECK BLOOD SUGAR 3 TIMES A DAY 306 each 1  ? Alcohol Swabs (B-D SINGLE USE SWABS REGULAR) PADS 1 each by Does not apply route 6 (six) times daily. 600 each 1  ? ALEVE 220 MG tablet Take 220-440 mg by mouth 2 (two) times daily as needed (for pain).    ? amLODipine (NORVASC) 10 MG tablet Take 1 tablet (10 mg total) by mouth daily. 60 tablet 3  ? aspirin EC 81 MG tablet Take 81 mg by mouth daily.    ? atorvastatin (LIPITOR) 40 MG tablet Take 1 tablet (40 mg total) by mouth at bedtime. 90 tablet 3  ? Blood Glucose Calibration (ACCU-CHEK AVIVA) SOLN Use to check controls on diabetic glucose meter monthly 1 each 0  ? Blood Glucose Monitoring Suppl (ACCU-CHEK AVIVA PLUS) w/Device KIT Check blood sugar up to 3 times a day 1 kit 1  ? calcium-vitamin D (OSCAL WITH D) 500-200 MG-UNIT tablet Take 1 tablet by mouth 2 (two) times daily. 60 tablet 3  ? cetirizine (ZYRTEC ALLERGY) 10 MG tablet Take 1 tablet (10 mg total) by mouth daily. 7 tablet 0  ? diclofenac Sodium (VOLTAREN) 1 % GEL Apply 2 g topically 4 (four) times daily. (Patient taking differently: Apply 2 g topically 4 (four) times daily as needed (for pain).) 100 g 2  ? Dulaglutide (TRULICITY) 9.89 QJ/1.9ER SOPN Inject 0.75 mg into the skin once a week. 6 mL 3  ? DULoxetine (CYMBALTA) 20 MG capsule Take 1 capsule (20 mg total) by mouth 2 (two) times daily. 60 capsule 2  ? famotidine (PEPCID) 20 MG tablet Take 1 tablet (20 mg total) by mouth at bedtime. 7 tablet 0  ? glucose blood (ACCU-CHEK GUIDE) test strip Check blood sugar 3 times per day 300 each 1  ?  insulin degludec (TRESIBA) 100 UNIT/ML FlexTouch Pen Inject 24 Units into the skin daily. 15 mL 2  ? Insulin Pen Needle 32G X 4 MM  MISC Use to inject insulin once daily at bedtime 100 each 1  ? Insulin Pen Needle 32G X 4 MM MISC Use to inject insulin once daily at bedtime 100 each 1  ? latanoprost (XALATAN) 0.005 % ophthalmic solution PLACE 1 DROP INTO BOTH EYES AT BEDTIME. 7.5 mL 0  ? losartan (COZAAR) 50 MG tablet TAKE 1 TABLET BY MOUTH ONCE DAILY (Patient taking differently: Take 50 mg by mouth at bedtime.) 90 tablet 1  ? metFORMIN (GLUCOPHAGE-XR) 500 MG 24 hr tablet Take 2 tablets (1,000 mg total) by mouth daily with breakfast. 60 tablet 11  ? mupirocin ointment (BACTROBAN) 2 % Apply 1 application topically daily. 22 g 0  ? timolol (TIMOPTIC) 0.5 % ophthalmic solution INSTILL 1 DROP INTO BOTH EYES EVERY MORNING (Patient taking differently: Place 1 drop into both eyes in the morning.) 5 mL 11  ? ?No current facility-administered medications for this visit.  ? ? ?REVIEW OF SYSTEMS:  ? ?_0  denotes positive finding, _1  denotes negative finding ?Cardiac  Comments:  ?Chest pain or chest pressure:    ?Shortness of breath upon exertion:    ?Short of breath when lying flat:    ?Irregular heart rhythm:    ?    ?Vascular    ?Pain in calf, thigh, or hip brought on by ambulation:    ?Pain in feet at night that wakes you up from your sleep:     ?Blood clot in your veins:    ?Leg swelling:     ?    ?Pulmonary    ?Oxygen at home:    ?Productive cough:     ?Wheezing:     ?    ?Neurologic    ?Sudden weakness in arms or legs:     ?Sudden numbness in arms or legs:     ?Sudden onset of difficulty speaking or slurred speech:    ?Temporary loss of vision in one eye:     ?Problems with dizziness:     ?    ?Gastrointestinal    ?Blood in stool:     ? ?Vomited blood:     ?    ?Genitourinary    ?Burning when urinating:     ?Blood in urine:    ?    ?Psychiatric    ?Major depression:     ?    ?Hematologic    ?Bleeding problems:     ?Problems with blood clotting too easily:    ?    ?Skin    ?Rashes or ulcers:    ?    ?Constitutional    ?Fever or chills:    ? ?PHYSICAL EXAM:  ? ?Vitals:  ? 04/10/22 1104  ?BP: 130/71  ?Pulse: 81  ?Resp: 20  ?Temp:

## 2022-04-13 ENCOUNTER — Other Ambulatory Visit (HOSPITAL_COMMUNITY): Payer: Self-pay

## 2022-04-13 ENCOUNTER — Ambulatory Visit (INDEPENDENT_AMBULATORY_CARE_PROVIDER_SITE_OTHER): Payer: Medicare Other | Admitting: Internal Medicine

## 2022-04-13 ENCOUNTER — Other Ambulatory Visit: Payer: Self-pay

## 2022-04-13 VITALS — BP 137/68 | HR 90 | Temp 98.0°F | Ht 65.0 in | Wt 129.3 lb

## 2022-04-13 DIAGNOSIS — Z794 Long term (current) use of insulin: Secondary | ICD-10-CM | POA: Diagnosis not present

## 2022-04-13 DIAGNOSIS — L680 Hirsutism: Secondary | ICD-10-CM | POA: Diagnosis not present

## 2022-04-13 DIAGNOSIS — E119 Type 2 diabetes mellitus without complications: Secondary | ICD-10-CM

## 2022-04-13 LAB — GLUCOSE, CAPILLARY: Glucose-Capillary: 142 mg/dL — ABNORMAL HIGH (ref 70–99)

## 2022-04-13 LAB — POCT GLYCOSYLATED HEMOGLOBIN (HGB A1C): Hemoglobin A1C: 9.7 % — AB (ref 4.0–5.6)

## 2022-04-13 MED ORDER — TRULICITY 1.5 MG/0.5ML ~~LOC~~ SOAJ
1.5000 mg | SUBCUTANEOUS | 2 refills | Status: DC
  Filled 2022-04-13: qty 2, 28d supply, fill #0

## 2022-04-13 NOTE — Assessment & Plan Note (Addendum)
Patient with A1c of 10 last on 01/2022.  Patient stated she has trying to be compliant with her regimen. She is taking her Trulicity, but was out of her [place] for the entirety of March of 2023  Her current regimen is [place] 24 units daily, Trulicity 9.40 milligrams weekly, and metformin 1000 mg twice daily.  A1c today is 9.7 today, we discussed increasing her Trulicity to 1.5 mg weekly, and following up in 3 months time. ?- Metformin 1000 mg twice daily ?- [place] 24 units daily ?- Increase Trulicity 1.5 mg weekly ?- Follow-up A1c in 3 months ?

## 2022-04-13 NOTE — Progress Notes (Signed)
? ?  CC: Diabetes follow-up ? ?HPI: ? ?[name] is a 66 y.o. person, with a PMH noted below, who presents to the clinic diabetes follow-up. To see the management of their acute and chronic conditions, please see the A&P note under the Encounters tab.  ? ?Past Medical History:  ?Diagnosis Date  ? Allergy   ? Anxiety   ? occasional  ? Arthritis   ? Atherosclerotic peripheral vascular disease with intermittent claudication ([place])   ? s/p fem-fem bypass 2011. ABI 07/2012 0.5 R 0.65 L  ? Diabetes mellitus without complication ([place])   ? GERD (gastroesophageal reflux disease)   ? Hyperlipidemia   ? Hypertension   ? IDDM (insulin dependent diabetes mellitus) 07/21/2007  ? July 2011: Femoral-femoral bypass  ABI 08/11/2012 notable for 0.4-0.59 on right ankle suggestive of severe arterial occlusive disease at rest and 0.60-0.7 on left ankle suggestive of moderate arterial occlusive disease at rest.   ? Iron deficiency anemia 05/08/2017  ? Low back pain 12/18/2013  ? Personal history of colonic polyps - adenomas 03/07/2014  ? Rotator cuff impingement syndrome of left shoulder 03/23/2016  ? Tobacco abuse   ? ?Review of Systems:   ?Review of Systems  ?Constitutional:  Negative for fever and malaise/fatigue.  ?HENT:    ?     Facial growth  ?Respiratory:  Negative for cough.   ?Cardiovascular:  Negative for chest pain and palpitations.  ?Gastrointestinal:  Negative for abdominal pain, diarrhea, nausea and vomiting.  ?Genitourinary:  Negative for frequency.   ? ?Physical Exam: ? ?Vitals:  ? 04/13/22 0931  ?BP: 137/68  ?Pulse: 90  ?Temp: 98 ?F (36.7 ?C)  ?TempSrc: Oral  ?SpO2: 100%  ?Weight: 129 lb 4.8 oz (58.7 kg)  ?Height: '5\' 5"'$  (1.651 m)  ? ?Physical Exam ?Constitutional:   ?   General: She is not in acute distress. ?   Appearance: She is normal weight. She is not ill-appearing or toxic-appearing.  ?HENT:  ?   Head:  ?   Comments: Coarse gray hairs on the upper lip, chin, and anterior neck ?Cardiovascular:  ?   Rate and Rhythm:  Normal rate and regular rhythm.  ?   Pulses: Normal pulses.  ?   Heart sounds: Normal heart sounds. No murmur heard. ?  No friction rub. No gallop.  ?Pulmonary:  ?   Effort: Pulmonary effort is normal. No respiratory distress.  ?   Breath sounds: Normal breath sounds. No wheezing, rhonchi or rales.  ?Abdominal:  ?   General: Abdomen is flat. Bowel sounds are normal.  ?Neurological:  ?   Mental Status: She is alert.  ?Psychiatric:     ?   Mood and Affect: Mood normal.     ?   Behavior: Behavior normal.  ?  ? ?Assessment & Plan:  ? ?See Encounters Tab for problem based charting. ? ?Patient discussed with Dr. Philipp Ovens ? ?

## 2022-04-13 NOTE — Assessment & Plan Note (Signed)
Patient seen and evaluated on 02/2022 for hirsutism.  Patient states that she has been to using, and shaving her facial hair, but would like to get laser treatment.  While she has been postmenopausal,, will continue further work-up with testosterone and early morning lab draw for 17 hydroxyprogesterone for further evaluation. ?- Testosterone today ?- Future lab visit for 8:00 17 hydroxyprogesterone, patient expected to come this week. ?- Recheck the patient once labs for further evaluation and recommendations ?

## 2022-04-13 NOTE — Patient Instructions (Addendum)
To Ms. Knodel,  ? ?It was a pleasure seeing you again! Today we discussed your diabetes and facial hair growth. For your diabetes, we are going to increase your Trulicity today to 1.5 mg a week. For your hair growth, we will assess your testosterone levels today, and have you come in for a lab visit later this week between 8:00-9:00 to assess more blood work. I will call you with the results and next steps once I have your results.  ?If there are no abnormalities, we will see you in three months.  ?Have a good day,  ?Maudie Mercury, MD ?

## 2022-04-14 LAB — TESTOSTERONE: Testosterone: 25 ng/dL (ref 3–67)

## 2022-04-21 ENCOUNTER — Other Ambulatory Visit (HOSPITAL_COMMUNITY): Payer: Self-pay

## 2022-04-22 ENCOUNTER — Other Ambulatory Visit (INDEPENDENT_AMBULATORY_CARE_PROVIDER_SITE_OTHER): Payer: 59

## 2022-04-22 DIAGNOSIS — L680 Hirsutism: Secondary | ICD-10-CM | POA: Diagnosis not present

## 2022-04-22 NOTE — Progress Notes (Signed)
Internal Medicine Clinic Attending ? ?Case discussed with Dr. Winters  At the time of the visit.  We reviewed the resident?s history and exam and pertinent patient test results.  I agree with the assessment, diagnosis, and plan of care documented in the resident?s note.  ?

## 2022-04-24 LAB — 17-HYDROXYPROGESTERONE: 17-OH Progesterone LCMS: 28 ng/dL

## 2022-04-30 ENCOUNTER — Other Ambulatory Visit: Payer: Self-pay | Admitting: Internal Medicine

## 2022-04-30 DIAGNOSIS — I10 Essential (primary) hypertension: Secondary | ICD-10-CM

## 2022-05-05 ENCOUNTER — Other Ambulatory Visit: Payer: Self-pay | Admitting: *Deleted

## 2022-05-05 ENCOUNTER — Other Ambulatory Visit (HOSPITAL_COMMUNITY): Payer: Self-pay

## 2022-05-05 DIAGNOSIS — I10 Essential (primary) hypertension: Secondary | ICD-10-CM

## 2022-05-08 MED ORDER — LOSARTAN POTASSIUM 50 MG PO TABS: 50.0000 mg | ORAL_TABLET | Freq: Every day | ORAL | 1 refills | Status: DC

## 2022-05-15 ENCOUNTER — Ambulatory Visit (INDEPENDENT_AMBULATORY_CARE_PROVIDER_SITE_OTHER): Payer: 59

## 2022-05-15 DIAGNOSIS — Z0000 Encounter for general adult medical examination without abnormal findings: Secondary | ICD-10-CM | POA: Diagnosis not present

## 2022-05-15 NOTE — Progress Notes (Signed)
Subjective:   Angie Cain is a 66 y.o. female who presents for Medicare Annual (Subsequent) preventive examination. I connected with  Angie Cain on 05/15/22 by a audio enabled telemedicine application and verified that I am speaking with the correct person using two identifiers.  Patient Location: Home  Provider Location: Office/Clinic  I discussed the limitations of evaluation and management by telemedicine. The patient expressed understanding and agreed to proceed.   Review of Systems    DEFERRED TO PCP        Objective:    There were no vitals filed for this visit. There is no height or weight on file to calculate BMI.     04/13/2022   10:16 AM 03/18/2022   10:41 AM 02/22/2022   10:07 AM 01/21/2022    9:32 AM 10/21/2021   10:32 AM 09/09/2021   10:04 AM 07/17/2021    3:38 PM  Advanced Directives  Does Patient Have a Medical Advance Directive? No No Yes No No No   Type of Advance Directive   [place]      Does patient want to make changes to medical advance directive?   No - Patient declined      Would patient like information on creating a medical advance directive? No - Patient declined No - Patient declined  No - Patient declined No - Patient declined No - Patient declined Yes (MAU/Ambulatory/Procedural Areas - Information given)    Current Medications (verified) Outpatient Encounter Medications as of 05/15/2022  Medication Sig   Accu-Chek FastClix Lancets MISC CHECK BLOOD SUGAR 3 TIMES A DAY   Alcohol Swabs (B-D SINGLE USE SWABS REGULAR) PADS 1 each by Does not apply route 6 (six) times daily.   ALEVE 220 MG tablet Take 220-440 mg by mouth 2 (two) times daily as needed (for pain).   amLODipine (NORVASC) 10 MG tablet TAKE ONE TABLET BY MOUTH DAILY AT 9AM   aspirin EC 81 MG tablet Take 81 mg by mouth daily.   atorvastatin (LIPITOR) 40 MG tablet Take 1 tablet (40 mg total) by mouth at bedtime.   Blood Glucose Calibration (ACCU-CHEK AVIVA) SOLN Use  to check controls on diabetic glucose meter monthly   Blood Glucose Monitoring Suppl (ACCU-CHEK AVIVA PLUS) w/Device KIT Check blood sugar up to 3 times a day   calcium-vitamin D (OSCAL WITH D) 500-200 MG-UNIT tablet Take 1 tablet by mouth 2 (two) times daily.   cetirizine (ZYRTEC ALLERGY) 10 MG tablet Take 1 tablet (10 mg total) by mouth daily.   diclofenac Sodium (VOLTAREN) 1 % GEL Apply 2 g topically 4 (four) times daily. (Patient taking differently: Apply 2 g topically 4 (four) times daily as needed (for pain).)   Dulaglutide (TRULICITY) 1.5 JM/4.2AS SOPN Inject 1.5 mg into the skin once a week.   DULoxetine (CYMBALTA) 20 MG capsule Take 1 capsule (20 mg total) by mouth 2 (two) times daily.   famotidine (PEPCID) 20 MG tablet Take 1 tablet (20 mg total) by mouth at bedtime.   glucose blood (ACCU-CHEK GUIDE) test strip Check blood sugar 3 times per day   insulin degludec (TRESIBA) 100 UNIT/ML FlexTouch Pen Inject 24 Units into the skin daily.   Insulin Pen Needle 32G X 4 MM MISC Use to inject insulin once daily at bedtime   Insulin Pen Needle 32G X 4 MM MISC Use to inject insulin once daily at bedtime   latanoprost (XALATAN) 0.005 % ophthalmic solution PLACE 1 DROP INTO BOTH EYES AT BEDTIME.  losartan (COZAAR) 50 MG tablet Take 1 tablet (50 mg total) by mouth at bedtime.   metFORMIN (GLUCOPHAGE-XR) 500 MG 24 hr tablet Take 2 tablets (1,000 mg total) by mouth daily with breakfast.   mupirocin ointment (BACTROBAN) 2 % Apply 1 application topically daily.   timolol (TIMOPTIC) 0.5 % ophthalmic solution INSTILL 1 DROP INTO BOTH EYES EVERY MORNING (Patient taking differently: Place 1 drop into both eyes in the morning.)   No facility-administered encounter medications on file as of 05/15/2022.    Allergies (verified) Bupropion, Jardiance [empagliflozin], Metronidazole, Shellfish allergy, and Shrimp extract allergy skin test   History: Past Medical History:  Diagnosis Date   Allergy    Anxiety     occasional   Arthritis    Atherosclerotic peripheral vascular disease with intermittent claudication ([place])    s/p fem-fem bypass 2011. ABI 07/2012 0.5 R 0.65 L   Diabetes mellitus without complication (HCC)    GERD (gastroesophageal reflux disease)    Hyperlipidemia    Hypertension    IDDM (insulin dependent diabetes mellitus) 07/21/2007   July 2011: Femoral-femoral bypass  ABI 08/11/2012 notable for 0.4-0.59 on right ankle suggestive of severe arterial occlusive disease at rest and 0.60-0.7 on left ankle suggestive of moderate arterial occlusive disease at rest.    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   Family History  Problem Relation Age of Onset   Diabetes Mother    Diabetes Father    Diabetes Sister    Hypertension Sister    Diabetes Brother    Hypertension Brother    Diabetes Son    Diabetes Brother    Diabetes Brother    Diabetes Brother    Diabetes Brother    Diabetes Brother    Colon cancer Neg Hx    Esophageal cancer Neg Hx    Stomach cancer Neg Hx    Rectal cancer Neg Hx    Breast cancer Neg Hx    Social History   Socioeconomic History   Marital status: Married    Spouse name: Not on file   Number of children: Not on file   Years of education: Not on file   Highest education level: Not on file  Occupational History   Not on file  Tobacco Use   Smoking status: Every Day    Packs/day: 0.25    Types: Cigarettes   Smokeless tobacco: Never   Tobacco comments:    Smokes 4 cigs/day.  Vaping Use   Vaping Use: Never used  Substance and Sexual Activity   Alcohol use: Yes    Alcohol/week: 1.0 - 2.0 standard  drink    Types: 1 - 2 Glasses of wine per week    Comment: weekly   Drug use: No   Sexual activity: Yes    Partners: Male  Other Topics Concern   Not on file  Social History Narrative   Current Social History 11/22/2020        Patient lives with spouse in an apartment which is 1 story. There are no steps up to the entrance.  Patient's method of transportation is personal car.      The highest level of education was high school diploma.      The patient currently disabled.      Identified important Relationships are Husband and sons       Pets : None       Interests / Fun: Arts and crafts, music, reading       Current Stressors: depression       Religious / Personal Beliefs: Christian, non-denominational    Social Determinants of Health   Financial Resource Strain: Not on file  Food Insecurity: Not on file  Transportation Needs: Not on file  Physical Activity: Not on file  Stress: Not on file  Social Connections: Not on file    Tobacco Counseling Ready to quit: Not Answered Counseling given: Not Answered Tobacco comments: Smokes 4 cigs/day.   Clinical Intake:                 Diabetic? YES          Activities of Daily Living    04/13/2022   10:14 AM 03/18/2022   10:41 AM  In your present state of health, do you have any difficulty performing the following activities:  Hearing? 0 0  Vision? 0 0  Difficulty concentrating or making decisions? 1 1  Walking or climbing stairs? 0 0  Dressing or bathing? 0 0  Doing errands, shopping? 0 0    Patient Care Team: Lacinda Axon, MD as PCP - General (Student) Clent Jacks, MD as Consulting Physician (Ophthalmology)  Indicate any recent Medical Services you may have received from other than [facility] providers in the past year (date may be approximate).     Assessment:   This is a routine wellness examination for [place].  Hearing/Vision screen No results found.  Dietary issues and  exercise activities discussed:     Goals Addressed   None   Depression Screen    04/13/2022   10:11 AM 03/18/2022   11:26 AM 02/11/2022   10:23 AM 01/21/2022   10:44 AM 01/21/2022    9:35 AM 10/21/2021   10:41 AM 09/09/2021   10:11 AM  PHQ 2/9 Scores  PHQ - 2 Score '2 2 2 2 ' 0 0 2  PHQ- 9 Score '7 9 8 9  ' 0 5    Fall Risk    04/13/2022   10:11 AM 03/18/2022   10:41 AM 01/21/2022    9:32 AM 10/21/2021   10:31 AM 09/16/2021    4:10 PM  Fall Risk   Falls in the past year? 0 0 0 0 0  Number falls in past yr: 0 0 0 0 0  Injury with Fall? 0 0 0 0 0  Risk for fall due to : No Fall Risks No Fall Risks  No Fall Risks   Follow up Falls evaluation completed;Falls prevention discussed Falls evaluation completed;Falls prevention discussed Falls evaluation completed Falls evaluation completed;Falls prevention discussed Falls evaluation completed    FALL RISK PREVENTION PERTAINING TO THE HOME:  Any stairs in or around the home? No  If so, are there any without handrails? No  Home free of loose throw rugs in walkways, pet beds, electrical cords, etc? No  Adequate lighting in your home to reduce risk of falls? Yes   ASSISTIVE DEVICES UTILIZED TO PREVENT FALLS:  Life alert? No  Use of a cane, walker or w/c? No  Grab bars in the bathroom? No  Shower chair or  bench in shower? No  Elevated toilet seat or a handicapped toilet? No   TIMED UP AND GO:  Was the test performed? No .  Length of time to ambulate 10 feet: N/A sec.     Cognitive Function:        Immunizations Immunization History  Administered Date(s) Administered   Influenza Whole 10/22/2006   Influenza,inj,Quad PF,6+ Mos 09/18/2013, 10/26/2014, 10/09/2016, 01/06/2019, 09/12/2019, 09/17/2020   PFIZER(Purple Top)SARS-COV-2 Vaccination 01/22/2020, 02/16/2020, 09/24/2020, 04/02/2021   Pneumococcal Conjugate-13 12/23/2020   Pneumococcal Polysaccharide-23 09/21/2005   Td 12/17/2003   Tdap 06/14/2015    TDAP status: Up to date  Flu  Vaccine status: Up to date  Pneumococcal vaccine status: Due, Education has been provided regarding the importance of this vaccine. Advised may receive this vaccine at local pharmacy or Health Dept. Aware to provide a copy of the vaccination record if obtained from local pharmacy or Health Dept. Verbalized acceptance and understanding.  Covid-19 vaccine status: Completed vaccines  Qualifies for Shingles Vaccine? Yes   Zostavax completed No   Shingrix Completed?: No.    Education has been provided regarding the importance of this vaccine. Patient has been advised to call insurance company to determine out of pocket expense if they have not yet received this vaccine. Advised may also receive vaccine at local pharmacy or Health Dept. Verbalized acceptance and understanding.  Screening Tests Health Maintenance  Topic Date Due   Zoster Vaccines- Shingrix (1 of 2) Never done   DEXA SCAN  04/29/2018   COVID-19 Vaccine (5 - Booster for Pfizer series) 05/28/2021   Pneumonia Vaccine 28+ Years old (3 - PPSV23 if available, else PCV20) 12/23/2021   COLONOSCOPY (Pts 45-56yr Insurance coverage will need to be confirmed)  06/14/2022   INFLUENZA VACCINE  07/14/2022   HEMOGLOBIN A1C  07/14/2022   OPHTHALMOLOGY EXAM  08/07/2022   FOOT EXAM  01/21/2023   MAMMOGRAM  09/26/2023   TETANUS/TDAP  06/13/2025   Hepatitis C Screening  Completed   HPV VACCINES  Aged Out    Health Maintenance  Health Maintenance Due  Topic Date Due   Zoster Vaccines- Shingrix (1 of 2) Never done   DEXA SCAN  04/29/2018   COVID-19 Vaccine (5 - Booster for Pfizer series) 05/28/2021   Pneumonia Vaccine 68 Years old (3 - PPSV23 if available, else PCV20) 12/23/2021    Colorectal cancer screening: Type of screening: Colonoscopy. Completed 06/14/2017. Repeat every 5 years  Mammogram status: Completed 2. Repeat every year  Bone Density status: Completed 04/29/2016. Results reflect: Bone density results: OSTEOPENIA. Repeat every  2 years.  Lung Cancer Screening: (Low Dose CT Chest recommended if Age 68-80years, 30 pack-year currently smoking OR have quit w/in 15years.) does not qualify.   Lung Cancer Screening Referral:  DEFERRED TO PCP   Additional Screening:  Hepatitis C Screening: does qualify; Completed 10/09/2016  Vision Screening: Recommended annual ophthalmology exams for early detection of glaucoma and other disorders of the eye. Is the patient up to date with their annual eye exam?  Yes  Who is the provider or what is the name of the office in which the patient attends annual eye exams?  Dr GKaty Fitch If pt is not established with a provider, would they like to be referred to a provider to establish care?  NO   Dental Screening: Recommended annual dental exams for proper oral hygiene  Community Resource Referral / Chronic Care Management: CRR required this visit?  No   CCM required this visit?  No      Plan:     I have personally reviewed and noted the following in the patient's chart:   Medical and social history Use of alcohol, tobacco or illicit drugs  Current medications and supplements including opioid prescriptions.  Functional ability and status Nutritional status Physical activity Advanced directives List of other physicians Hospitalizations, surgeries, and ER visits in previous 12 months Vitals Screenings to include cognitive, depression, and falls Referrals and appointments  In addition, I have reviewed and discussed with patient certain preventive protocols, quality metrics, and best practice recommendations. A written personalized care plan for preventive services as well as general preventive health recommendations were provided to patient.     Judyann Munson, CMA   05/15/2022   Nurse Notes:  NON FACE TO FACE    Ms. Mancino , Thank you for taking time to come for your Medicare Wellness Visit. I appreciate your ongoing commitment to your health goals. Please review the  following plan we discussed and let me know if I can assist you in the future.   These are the goals we discussed:  Goals      Blood Pressure < 140/90     HEMOGLOBIN A1C < 7.0     Increase physical activity     Begin seated and standing exercises with the enclosed exercise band.        LDL CALC < 100     Quit smoking / using tobacco        This is a list of the screening recommended for you and due dates:  Health Maintenance  Topic Date Due   Zoster (Shingles) Vaccine (1 of 2) Never done   DEXA scan (bone density measurement)  04/29/2018   COVID-19 Vaccine (5 - Booster for Pfizer series) 05/28/2021   Pneumonia Vaccine (3 - PPSV23 if available, else PCV20) 12/23/2021   Colon Cancer Screening  06/14/2022   Flu Shot  07/14/2022   Hemoglobin A1C  07/14/2022   Eye exam for diabetics  08/07/2022   Complete foot exam   01/21/2023   Mammogram  09/26/2023   Tetanus Vaccine  06/13/2025   Hepatitis C Screening: USPSTF Recommendation to screen - Ages 18-79 yo.  Completed   HPV Vaccine  Aged Out

## 2022-05-27 ENCOUNTER — Telehealth: Payer: Self-pay

## 2022-05-27 NOTE — Telephone Encounter (Signed)
Requesting lab results, please call pt back.  

## 2022-06-01 ENCOUNTER — Other Ambulatory Visit: Payer: Self-pay | Admitting: Internal Medicine

## 2022-06-01 DIAGNOSIS — E1142 Type 2 diabetes mellitus with diabetic polyneuropathy: Secondary | ICD-10-CM

## 2022-06-22 NOTE — Progress Notes (Signed)
Internal Medicine Clinic Attending  I reviewed the AWV findings.  I agree with the assessment, diagnosis, and plan of care documented in the AWV note.     

## 2022-07-01 ENCOUNTER — Encounter: Payer: Self-pay | Admitting: Internal Medicine

## 2022-07-29 ENCOUNTER — Other Ambulatory Visit (HOSPITAL_COMMUNITY): Payer: Self-pay

## 2022-07-29 ENCOUNTER — Other Ambulatory Visit: Payer: Self-pay

## 2022-07-29 ENCOUNTER — Encounter: Payer: Self-pay | Admitting: Student

## 2022-07-29 ENCOUNTER — Ambulatory Visit: Payer: 59 | Admitting: Dietician

## 2022-07-29 ENCOUNTER — Ambulatory Visit (INDEPENDENT_AMBULATORY_CARE_PROVIDER_SITE_OTHER): Payer: 59 | Admitting: Student

## 2022-07-29 ENCOUNTER — Encounter: Payer: Self-pay | Admitting: Dietician

## 2022-07-29 VITALS — BP 113/56 | HR 68 | Temp 97.9°F | Ht 65.0 in | Wt 130.5 lb

## 2022-07-29 DIAGNOSIS — L680 Hirsutism: Secondary | ICD-10-CM

## 2022-07-29 DIAGNOSIS — Z794 Long term (current) use of insulin: Secondary | ICD-10-CM

## 2022-07-29 DIAGNOSIS — F330 Major depressive disorder, recurrent, mild: Secondary | ICD-10-CM

## 2022-07-29 DIAGNOSIS — Z0000 Encounter for general adult medical examination without abnormal findings: Secondary | ICD-10-CM

## 2022-07-29 DIAGNOSIS — Z86010 Personal history of colonic polyps: Secondary | ICD-10-CM

## 2022-07-29 DIAGNOSIS — E1165 Type 2 diabetes mellitus with hyperglycemia: Secondary | ICD-10-CM

## 2022-07-29 DIAGNOSIS — E119 Type 2 diabetes mellitus without complications: Secondary | ICD-10-CM

## 2022-07-29 LAB — POCT GLYCOSYLATED HEMOGLOBIN (HGB A1C): Hemoglobin A1C: 9.5 % — AB (ref 4.0–5.6)

## 2022-07-29 LAB — GLUCOSE, CAPILLARY: Glucose-Capillary: 115 mg/dL — ABNORMAL HIGH (ref 70–99)

## 2022-07-29 MED ORDER — INSULIN DEGLUDEC 100 UNIT/ML ~~LOC~~ SOPN
22.0000 [IU] | PEN_INJECTOR | Freq: Every day | SUBCUTANEOUS | 2 refills | Status: DC
  Filled 2022-07-29 – 2022-08-03 (×2): qty 15, 68d supply, fill #0

## 2022-07-29 MED ORDER — TRULICITY 3 MG/0.5ML ~~LOC~~ SOAJ
3.0000 mg | SUBCUTANEOUS | 2 refills | Status: DC
  Filled 2022-07-29: qty 4, 56d supply, fill #0

## 2022-07-29 NOTE — Patient Instructions (Signed)
Thank you, Ms.Daaiyah Boston for allowing [place] to provide your care today. Today we discussed your diabetes, blood pressure, hirsutism. Your A1c is still high at 9.5%. Because of the wide variation in your blood sugars, we are placing a CGM today for close monitoring for trends to help with adjusting your insulin.    I have ordered the following labs for you:   Lab Orders         Glucose, capillary         POC Hbg A1C      I will call if any are abnormal. All of your labs can be accessed through "My Chart".  I have place a referrals to Dermatologist  I have ordered the following medication/changed the following medications:  Increase Trulicity to 3 mg weekly Decrease Tresiba from 26 units to 22 units  My Chart Access: [email or web address]  Please follow-up in 1 week for CGM read  Please make sure to arrive 15 minutes prior to your next appointment. If you arrive late, you may be asked to reschedule.    We look forward to seeing you next time. Please call our clinic at [phone] if you have any questions or concerns. The best time to call is Monday-Friday from 9am-4pm, but there is someone available 24/7. If after hours or the weekend, call the main hospital number and ask for the Internal Medicine Resident On-Call. If you need medication refills, please notify your pharmacy one week in advance and they will send [place] a request.   Thank you for letting us take part in your care. Wishing you the best!  Lacinda Axon, MD 07/29/2022, 11:02 AM IM Resident, PGY-3 [place]ian 41:10

## 2022-07-29 NOTE — Progress Notes (Signed)
Documentation for Freestyle Libre Pro Continuous glucose monitoring Freestyle Libre Pro CGM sensor placed today. Patient was educated about wearing sensor, keeping food, activity and medication log and when to call office. Patient was educated about how to care for the sensor and not to have an MRI, CT or Diathermy while wearing the sensor. Follow up was arranged with the patient for 1 week.   Lot #: [id number] A Serial #: [id number] Expiration Date: 10/13/2022  Debera Lat, RD 07/29/2022 11:23 AM.

## 2022-07-29 NOTE — Patient Instructions (Signed)
Please record the time, amount and what food drinks and activities you have while wearing the continuous glucose monitor (CGM).  Bring the folder with you to follow up appointments. If your monitor falls off, please place it in the bag provided in your folder and bring it back with you to your next appointment.   Do not have a CT or an MRI while wearing the CGM.   1 week visit has been set up with a doctor for the first of two CGM downloads.   You will also return in 2 weeks to have your second download and the CGM removed.  Butch Penny [phone]

## 2022-07-29 NOTE — Progress Notes (Signed)
CC: Diabetes follow-up  HPI:  Angie Cain is a 66 y.o. female with PMH as below who presents to clinic to follow-up on her diabetes. Please see problem based charting for evaluation, assessment and plan.  Past Medical History:  Diagnosis Date   Allergy    Anxiety    occasional   Arthritis    Atherosclerotic peripheral vascular disease with intermittent claudication ([place])    s/p fem-fem bypass 2011. ABI 07/2012 0.5 R 0.65 L   Diabetes mellitus without complication (HCC)    GERD (gastroesophageal reflux disease)    Hyperlipidemia    Hypertension    IDDM (insulin dependent diabetes mellitus) 07/21/2007   July 2011: Femoral-femoral bypass  ABI 08/11/2012 notable for 0.4-0.59 on right ankle suggestive of severe arterial occlusive disease at rest and 0.60-0.7 on left ankle suggestive of moderate arterial occlusive disease at rest.    Iron deficiency anemia 05/08/2017   Low back pain 12/18/2013   Personal history of colonic polyps - adenomas 03/07/2014   Rotator cuff impingement syndrome of left shoulder 03/23/2016   Tobacco abuse     Review of Systems:  Constitutional: Negative for fever, polydipsia or fatigue Eyes: Negative for visual changes Abdomen: Negative for abdominal pain, constipation or diarrhea Neuro: Negative for headache or weakness Psych: Positive for stress and occasional depressed mood  Physical Exam: General: Pleasant, well-appearing elderly woman. No acute distress. Cardiac: RRR. No murmurs, rubs or gallops. No LE edema Respiratory: Lungs CTAB. No wheezing or crackles. Abdominal: Soft, symmetric and non tender. Normal BS. Skin: Warm, dry and intact without rashes or lesions Extremities: Atraumatic. Radial and DP pulses 2+ and symmetric. Neuro: A&O x 3. Moves all extremities. Normal sensation to gross touch. Psych: Appropriate mood and affect.  Vitals:   07/29/22 0933  BP: (!) 113/56  Pulse: 68  Temp: 97.9 F (36.6 C)  TempSrc: Oral  SpO2: 100%   Weight: 130 lb 8 oz (59.2 kg)  Height: '5\' 5"'$  (1.651 m)    Assessment & Plan:   Type 2 diabetes mellitus ([place]) Patient here for diabetes follow-up.  A1c has slightly improved from 9.7% to 9.5%.  However, from download of patient's meter patient has had fasting sugars in the 50s and 60s.  Patient reports that she was not symptomatic during these numbers. States she has been eating consistently but likely eating less carbs on the days prior to the low sugars. Of note, patient supposed to be taking 24 units of Tresiba but has been taking 26 units.  Discussed placing a CGM today for more readings to assist with proper titration of her insulin.  Patient agreeable to this plan. She did not tolerate SGLT2 inhibitor in the past due to frequent UTIs.  Plan: -CGM placed by daughter today -Increase Trulicity from 1.5 mg to 3 mg weekly -Decrease Tresiba from 26 units to 22 units for now -Follow-up in 1 week for CGM read -Follow-up with Butch Penny for nutrition counseling -Repeat A1c in 3 months  Hx of adenomatous colonic polyps Last colonoscopy in 2018 showed multiple adenomas that were removed. GI recommended repeat in 5 years. -GI referral for colonoscopy  Healthcare maintenance Last DEXA scan was more than 2 years ago showed osteopenia.  Discussed repeat DEXA scan, pneumonia vaccine and shingles vaccine at next office visit.   Major depression PHQ-9 has improved from 9 in February to 4 today. States her stress level is starting to come down. She recently bought her own car and thinks this will help give her  some independence and reduce her stress. Patient is in a better mood today. -Continue duloxetine 20 mg daily -Continue to monitor with repeat PHQ-9s  Hirsutism Patient continues to have excessive hair around her lips.  Work-up of hirsutism in this postmenopausal woman with a history of hysterectomy has been unrevealing. Recent labs for testosterone and 17-hydroxyprogesterone are within normal  limits. Low concern for androgen secreting tumor with no B symptoms and normal androgen levels. 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 Patient requesting referral back to dermatology for repeat laser hair removal. -CTM -Dermatology referral    See Encounters Tab for problem based charting.  Patient discussed with Dr.  Thomes Cake, MD, MPH

## 2022-07-31 ENCOUNTER — Encounter: Payer: Self-pay | Admitting: Student

## 2022-07-31 ENCOUNTER — Other Ambulatory Visit: Payer: Self-pay | Admitting: Student

## 2022-07-31 ENCOUNTER — Other Ambulatory Visit (HOSPITAL_COMMUNITY): Payer: Self-pay

## 2022-07-31 DIAGNOSIS — E119 Type 2 diabetes mellitus without complications: Secondary | ICD-10-CM

## 2022-07-31 MED ORDER — INSULIN PEN NEEDLE 32G X 4 MM MISC
4 refills | Status: DC
  Filled 2022-07-31: qty 100, 100d supply, fill #0
  Filled 2023-01-19: qty 100, 100d supply, fill #1

## 2022-07-31 NOTE — Assessment & Plan Note (Addendum)
Patient here for diabetes follow-up.  A1c has slightly improved from 9.7% to 9.5%.  However, from download of patient's meter patient has had fasting sugars in the 50s and 60s.  Patient reports that she was not symptomatic during these numbers. States she has been eating consistently but likely eating less carbs on the days prior to the low sugars. Of note, patient supposed to be taking 24 units of Tresiba but has been taking 26 units.  Discussed placing a CGM today for more readings to assist with proper titration of her insulin.  Patient agreeable to this plan. She did not tolerate SGLT2 inhibitor in the past due to frequent UTIs.  Plan: -CGM placed by daughter today -Increase Trulicity from 1.5 mg to 3 mg weekly -Decrease Tresiba from 26 units to 22 units for now -Follow-up in 1 week for CGM read -Follow-up with Butch Penny for nutrition counseling -Repeat A1c in 3 months

## 2022-07-31 NOTE — Assessment & Plan Note (Signed)
Last colonoscopy in 2018 showed multiple adenomas that were removed. GI recommended repeat in 5 years. -GI referral for colonoscopy

## 2022-07-31 NOTE — Assessment & Plan Note (Signed)
Patient continues to have excessive hair around her lips.  Work-up of hirsutism in this postmenopausal woman with a history of hysterectomy has been unrevealing. Recent labs for testosterone and 17-hydroxyprogesterone are within normal limits. Low concern for androgen secreting tumor with no B symptoms and normal androgen levels. 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 Patient requesting referral back to dermatology for repeat laser hair removal. -CTM -Dermatology referral

## 2022-07-31 NOTE — Assessment & Plan Note (Signed)
Last DEXA scan was more than 2 years ago showed osteopenia.  Discussed repeat DEXA scan, pneumonia vaccine and shingles vaccine at next office visit.

## 2022-07-31 NOTE — Assessment & Plan Note (Signed)
PHQ-9 has improved from 9 in February to 4 today. States her stress level is starting to come down. She recently bought her own car and thinks this will help give her some independence and reduce her stress. Patient is in a better mood today. -Continue duloxetine 20 mg daily -Continue to monitor with repeat PHQ-9s

## 2022-07-31 NOTE — Progress Notes (Signed)
Internal Medicine Clinic Attending  Case discussed with Dr. Amponsah  At the time of the visit.  We reviewed the resident's history and exam and pertinent patient test results.  I agree with the assessment, diagnosis, and plan of care documented in the resident's note.  

## 2022-08-02 ENCOUNTER — Other Ambulatory Visit: Payer: Self-pay | Admitting: Internal Medicine

## 2022-08-02 DIAGNOSIS — E1165 Type 2 diabetes mellitus with hyperglycemia: Secondary | ICD-10-CM

## 2022-08-03 ENCOUNTER — Other Ambulatory Visit (HOSPITAL_COMMUNITY): Payer: Self-pay

## 2022-08-03 ENCOUNTER — Other Ambulatory Visit: Payer: Self-pay | Admitting: *Deleted

## 2022-08-05 ENCOUNTER — Other Ambulatory Visit: Payer: Self-pay

## 2022-08-05 ENCOUNTER — Encounter: Payer: Self-pay | Admitting: Student

## 2022-08-05 ENCOUNTER — Ambulatory Visit (INDEPENDENT_AMBULATORY_CARE_PROVIDER_SITE_OTHER): Payer: 59 | Admitting: Student

## 2022-08-05 ENCOUNTER — Other Ambulatory Visit (HOSPITAL_COMMUNITY): Payer: Self-pay

## 2022-08-05 DIAGNOSIS — E119 Type 2 diabetes mellitus without complications: Secondary | ICD-10-CM

## 2022-08-05 DIAGNOSIS — Z794 Long term (current) use of insulin: Secondary | ICD-10-CM | POA: Diagnosis not present

## 2022-08-05 MED ORDER — INSULIN LISPRO (1 UNIT DIAL) 100 UNIT/ML (KWIKPEN)
5.0000 [IU] | PEN_INJECTOR | Freq: Two times a day (BID) | SUBCUTANEOUS | 11 refills | Status: DC
  Filled 2022-08-05: qty 9, 90d supply, fill #0

## 2022-08-05 NOTE — Patient Instructions (Signed)
Thank you, Ms.Roopa Dorion for allowing [place] to provide your care today. Today we discussed your diabetes.  Your blood sugar is still high in the afternoon so I have added a short-acting insulin to your regimen.  I have ordered the following medication/changed the following medications:  Start Humalog 5 units with your big meals  My Chart Access: [email or web address]  Please follow-up in 1 weeks  Please make sure to arrive 15 minutes prior to your next appointment. If you arrive late, you may be asked to reschedule.    We look forward to seeing you next time. Please call our clinic at [phone] if you have any questions or concerns. The best time to call is Monday-Friday from 9am-4pm, but there is someone available 24/7. If after hours or the weekend, call the main hospital number and ask for the Internal Medicine Resident On-Call. If you need medication refills, please notify your pharmacy one week in advance and they will send [place] a request.   Thank you for letting us take part in your care. Wishing you the best!  Lacinda Axon, MD 08/05/2022, 12:09 PM IM Resident, PGY-3 [facility] Milian 41:10

## 2022-08-05 NOTE — Progress Notes (Unsigned)
   CC: 1 week diabetes follow up  HPI:  Angie Cain is a 66 y.o. female with PMH as below who presents to clinic today to follow up on her diabetes. Please see problem based charting for evaluation, assessment and plan.  Past Medical History:  Diagnosis Date   Allergy    Anxiety    occasional   Arthritis    Atherosclerotic peripheral vascular disease with intermittent claudication ([place])    s/p fem-fem bypass 2011. ABI 07/2012 0.5 R 0.65 L   Diabetes mellitus without complication (HCC)    GERD (gastroesophageal reflux disease)    Hyperlipidemia    Hypertension    IDDM (insulin dependent diabetes mellitus) 07/21/2007   July 2011: Femoral-femoral bypass  ABI 08/11/2012 notable for 0.4-0.59 on right ankle suggestive of severe arterial occlusive disease at rest and 0.60-0.7 on left ankle suggestive of moderate arterial occlusive disease at rest.    Iron deficiency anemia 05/08/2017   Low back pain 12/18/2013   Personal history of colonic polyps - adenomas 03/07/2014   Rotator cuff impingement syndrome of left shoulder 03/23/2016   Tobacco abuse     Review of Systems:  Constitutional: Negative for fatigue or polydipsia.  Eyes: Negative for visual changes MSK: Negative for back pain Abdomen: Negative for polyuria or abdominal pain Neuro: Negative for headache, numbness or weakness  Physical Exam: General: Pleasant, well-appearing elderly woman. No acute distress. Cardiac: RRR. No murmurs, rubs or gallops. No LE edema Respiratory: Lungs CTAB. No wheezing or crackles. Skin: Warm, dry and intact without rashes or lesions Neuro: A&O x 3. Moves all extremities. Normal sensation to gross touch.  Psych: Appropriate mood and affect.  Vitals:   08/05/22 1101  BP: 131/69  Pulse: 72  Temp: 98.2 F (36.8 C)  TempSrc: Oral  SpO2: 100%  Weight: 130 lb 12.8 oz (59.3 kg)  Height: '5\' 5"'$  (1.651 m)    Assessment & Plan:   Type 2 diabetes mellitus ([place]) Patient here for 1 week follow  up for download of her CGM and adjustment to her insulin. On review of CGM data, patient's blood sugar is Very High 35%, High 32%, In Range 31%, Low 1% and Very Low 1%. On further review, her fasting sugars have been mostly in the normal range but she has been having significant high sugars starting from noon to 1 PM when she has her lunch.  Patient states her eating habits are inconsistent and depends on her mood but usually eats about 1-2 large meals per day and then snacks at night. States she does not feel like she needs to take her insulin when she does not eat a large meal. She reports 1 episode of hypoglycemia to 49 when she did not eat lunch on 1 particular day.  Patient counseled on importance of eating consistent meals as well as trying to cover her large meals so her blood sugars does not stay high throughout the day.  Patient agreeable to trying mealtime insulin with her large meals with plan to follow-up next week for reevaluation and adjustment to her insulin.  Plan: -Continue Tresiba 22 units daily -Continue Trulicity 3 mg weekly -Start Humalog 5 units twice daily with your 2 large meals -Follow-up in 1 week for CGM download and visit with Butch Penny    See Encounters Tab for problem based charting.  Patient discussed with Dr. Lockie Pares, MD, MPH

## 2022-08-06 ENCOUNTER — Encounter: Payer: Self-pay | Admitting: Student

## 2022-08-06 NOTE — Assessment & Plan Note (Addendum)
Patient here for 1 week follow up for download of her CGM and adjustment to her insulin. On review of CGM data, patient's blood sugar is Very High 35%, High 32%, In Range 31%, Low 1% and Very Low 1%. On further review, her fasting sugars have been mostly in the normal range but she has been having significant high sugars starting from noon to 1 PM when she has her lunch.  Patient states her eating habits are inconsistent and depends on her mood but usually eats about 1-2 large meals per day and then snacks at night. States she does not feel like she needs to take her insulin when she does not eat a large meal. She reports 1 episode of hypoglycemia to 49 when she did not eat lunch on 1 particular day.  Patient counseled on importance of eating consistent meals as well as trying to cover her large meals so her blood sugars does not stay high throughout the day.  Patient agreeable to trying mealtime insulin with her large meals with plan to follow-up next week for reevaluation and adjustment to her insulin.  Plan: -Continue Tresiba 22 units daily -Continue Trulicity 3 mg weekly -Start Humalog 5 units twice daily with your 2 large meals -Follow-up in 1 week for CGM download and visit with Butch Penny

## 2022-08-07 NOTE — Progress Notes (Signed)
Internal Medicine Clinic Attending  Case discussed with Dr. Amponsah  At the time of the visit.  We reviewed the resident's history and exam and pertinent patient test results.  I agree with the assessment, diagnosis, and plan of care documented in the resident's note.  

## 2022-08-26 ENCOUNTER — Ambulatory Visit (INDEPENDENT_AMBULATORY_CARE_PROVIDER_SITE_OTHER): Payer: Medicare Other | Admitting: Dietician

## 2022-08-26 ENCOUNTER — Ambulatory Visit (INDEPENDENT_AMBULATORY_CARE_PROVIDER_SITE_OTHER): Payer: Medicare Other

## 2022-08-26 ENCOUNTER — Telehealth: Payer: Self-pay | Admitting: Dietician

## 2022-08-26 ENCOUNTER — Other Ambulatory Visit (HOSPITAL_COMMUNITY): Payer: Self-pay

## 2022-08-26 ENCOUNTER — Encounter: Payer: Self-pay | Admitting: Dietician

## 2022-08-26 VITALS — BP 171/73 | HR 74 | Temp 98.0°F | Ht 65.0 in | Wt 124.0 lb

## 2022-08-26 DIAGNOSIS — I10 Essential (primary) hypertension: Secondary | ICD-10-CM

## 2022-08-26 DIAGNOSIS — E119 Type 2 diabetes mellitus without complications: Secondary | ICD-10-CM | POA: Diagnosis not present

## 2022-08-26 DIAGNOSIS — Z794 Long term (current) use of insulin: Secondary | ICD-10-CM

## 2022-08-26 DIAGNOSIS — E785 Hyperlipidemia, unspecified: Secondary | ICD-10-CM

## 2022-08-26 DIAGNOSIS — E1169 Type 2 diabetes mellitus with other specified complication: Secondary | ICD-10-CM

## 2022-08-26 DIAGNOSIS — F17210 Nicotine dependence, cigarettes, uncomplicated: Secondary | ICD-10-CM | POA: Diagnosis not present

## 2022-08-26 MED ORDER — ATORVASTATIN CALCIUM 40 MG PO TABS
40.0000 mg | ORAL_TABLET | Freq: Every day | ORAL | 3 refills | Status: DC
  Filled 2022-08-26 – 2022-09-29 (×2): qty 90, 90d supply, fill #0
  Filled 2023-01-19: qty 90, 90d supply, fill #1

## 2022-08-26 MED ORDER — AMLODIPINE-OLMESARTAN 10-20 MG PO TABS
1.0000 | ORAL_TABLET | Freq: Every day | ORAL | 2 refills | Status: DC
  Filled 2022-08-26: qty 30, 30d supply, fill #0
  Filled 2022-10-12: qty 30, 30d supply, fill #1
  Filled 2022-11-20: qty 30, 30d supply, fill #2

## 2022-08-26 MED ORDER — XIGDUO XR 5-1000 MG PO TB24
1.0000 | ORAL_TABLET | Freq: Every day | ORAL | 2 refills | Status: DC
  Filled 2022-08-26: qty 30, 30d supply, fill #0
  Filled 2022-10-12: qty 30, 30d supply, fill #1
  Filled 2022-11-20: qty 30, 30d supply, fill #2

## 2022-08-26 NOTE — Assessment & Plan Note (Addendum)
Reordered statin and encouraged patient to continue taking.

## 2022-08-26 NOTE — Assessment & Plan Note (Addendum)
Patient's A1c 9.5 3 weeks ago.  She has not been taking her medication because it does not make her feel good.  She has also heard that metformin can be bad for you.  Upon review of her CGM, her average reading was 192 and was above target 25% of the time and below target 4% of the time.  Patient is amenable to restarting medications with simpler regimen.  -Start Xigduo XR 04-999 daily -Continue Tresiba and Trulicity -Follow-up in 1 to 2 weeks

## 2022-08-26 NOTE — Progress Notes (Signed)
   CC: diabetes f/u  HPI:  Angie Cain is a 66 y.o. with past medical history as below who presents for diabetes follow-up.  Past Medical History:  Diagnosis Date   Allergy    Anxiety    occasional   Arthritis    Atherosclerotic peripheral vascular disease with intermittent claudication ([place])    s/p fem-fem bypass 2011. ABI 07/2012 0.5 R 0.65 L   Diabetes mellitus without complication (HCC)    GERD (gastroesophageal reflux disease)    Hyperlipidemia    Hypertension    IDDM (insulin dependent diabetes mellitus) 07/21/2007   July 2011: Femoral-femoral bypass  ABI 08/11/2012 notable for 0.4-0.59 on right ankle suggestive of severe arterial occlusive disease at rest and 0.60-0.7 on left ankle suggestive of moderate arterial occlusive disease at rest.    Iron deficiency anemia 05/08/2017   Low back pain 12/18/2013   Personal history of colonic polyps - adenomas 03/07/2014   Rotator cuff impingement syndrome of left shoulder 03/23/2016   Tobacco abuse    Review of Systems: See detailed assessment and plan for pertinent ROS.  Physical Exam:  Vitals:   08/26/22 1012  BP: (!) 171/73  Pulse: 74  Temp: 98 F (36.7 C)  TempSrc: Oral  SpO2: 100%  Weight: 124 lb (56.2 kg)  Height: '5\' 5"'$  (1.651 m)   Physical Exam Constitutional:      General: She is not in acute distress. Eyes:     Extraocular Movements: Extraocular movements intact.  Cardiovascular:     Rate and Rhythm: Normal rate and regular rhythm.  Pulmonary:     Effort: Pulmonary effort is normal.     Breath sounds: Normal breath sounds.  Skin:    General: Skin is warm and dry.  Neurological:     Mental Status: She is alert and oriented to person, place, and time.  Psychiatric:        Mood and Affect: Mood normal.      Assessment & Plan:   See Encounters Tab for problem based charting.  Essential hypertension Blood pressure elevated at 171/73 today.  Patient has not been taking her medications since she was  last here.  She denies any symptoms of hypertension or hypotension.  We will see her back in 1 to 2 weeks and discuss pressures in the setting of having restarted her medication.  -Will switch to combo amlodipine-olmesartan 10-20 mg daily -Follow-up in 1 to 2 weeks  Hyperlipidemia associated with type 2 diabetes mellitus ([place]) Reordered statin and encouraged patient to continue taking.  Type 2 diabetes mellitus (HCC) Patient's A1c 9.5 3 weeks ago.  She has not been taking her medication because it does not make her feel good.  She has also heard that metformin can be bad for you.  Upon review of her CGM, her average reading was 192 and was above target 25% of the time and below target 4% of the time.  Patient is amenable to restarting medications with simpler regimen.  -Start Xigduo XR 04-999 daily -Continue Tresiba and Trulicity -Follow-up in 1 to 2 weeks  Adrian wore the CGM for 15 days. The average reading was 192, % time in target was 70, % time below target was 4, and % time above target was. 25. Intervention will be to continue with [place], trulicity, and start Xigduo 04-999 daily. The patient will be scheduled to see [facility] in 1 week for a final appointment.    Patient seen with Dr. Heber Chesnee

## 2022-08-26 NOTE — Progress Notes (Signed)
Medical Nutrition Therapy:  Appt start time: 920 end time:  1008 Total time:a total of 48 minutes, 30 minutes on Medical Nutrition Therapy  and 18 on removing and downloading sensor Visit # 4  Last visit was 09-16-2021  Assessment:  Primary concerns today: meal planning for her diabetes.  Ms. Grogan reads labels to limit her carbohydrates. She  reports eating "squirrel like" mostly and sometimes having a full meal when she gets hungry. She states "I eat what I want, but not as much". She brought her meter today and we had 14 days of CGM data. They both show a significant lowering of her blood glucose during her second week of CGM. This pattern continued after her CGM stopped working on 08/12/22 while checking only before meals the average is 69.  Sh states she significantly changed her diet and stopped all her medications. Her weight dropped 5-6# in the past month confirming this change.  She states if her sugars begin getting higher she plans to resume insulin.   Estimated body mass index is 20.78 kg/m as calculated from the following:   Height as of 08/05/22: '5\' 5"'$  (1.651 m).   Weight as of this encounter: 124 lb 14.4 oz (56.7 kg).   Wt Readings from Last 10 Encounters:  08/26/22 124 lb (56.2 kg)  08/26/22 124 lb 14.4 oz (56.7 kg)  08/05/22 130 lb 12.8 oz (59.3 kg)  07/29/22 130 lb 8 oz (59.2 kg)  04/13/22 129 lb 4.8 oz (58.7 kg)  04/10/22 130 lb (59 kg)  03/18/22 128 lb 14.4 oz (58.5 kg)  02/22/22 129 lb 14.4 oz (58.9 kg)  02/11/22 129 lb 14.4 oz (58.9 kg)  01/21/22 132 lb 4.8 oz (60 kg)    SLEEP: not discussed today Stress level: 7/10 she states she allowed herself to gt angry is why it is higher, her coping strategies are praying and taking time for herself  CGM Results from download: Professional CGM week 1 Professional CGM week 2  Average glucose:   222 mg/dL for 8 days  192 for 15 days  Glucose management indicator:   8.6 % 7.9%  Time in range (70-180 mg/dL):   31 %   (Goal  >70%) 20%  Time High (181-250 mg/dL):   32 %   (Goal < 25%) 31%  Time Very High (>250 mg/dL):    35 %   (Goal < 5%) 48%  Time Low (54-69 mg/dL):   1 %   (Goal <4%) 1%  Time Very Low (<54 mg/dL):   1 %   (Goal <1%) 0%  Coefficient of variation:   42.4 %   (Goal <36%) 37.4%   Home blood glucose monitoring download x 30 days shows: average of 69 for 34 readings, range is 47( when taking insulin) to 93, says she self monitors before meals but in the office checked after breakfast and it was 88 ( was 92 before she ate)   Lab Results  Component Value Date   HGBA1C 9.5 (A) 07/29/2022   HGBA1C 9.7 (A) 04/13/2022   HGBA1C 10.0 (A) 01/21/2022   HGBA1C 7.4 (A) 10/21/2021   HGBA1C 11.0 (A) 07/17/2021     MEDICATIONS: none since ~August 23 DIETARY INTAKE:Usual eating pattern includes 2-3 meals and 0-1  snacks per day. Dining Out (times/week): a few times a week 24-hr recall:  Breakfast:12 PM 1/2 c homemade oatmeal, tea with cinnamon, ginger and orange peel, ~1/2 c fryut Dinner:6 PM salads, tacos, cereal Snack- fruit Beverages: a lot  of water, green tea  Usual physical activity:housework, activities of daily living  Progress Towards Goal(s):  Some progress.   Nutritional Diagnosis:  NB-1.1 Food and nutrition-related knowledge deficit As related to lack of recent and sufficient diabetes meal planning training.  As evidenced by her report and questions. Has improved    Intervention:  Nutrition education about changes and their affects, interpretation of her CGM and SMBG data, encouraging  her to continue changes and if needed add healthy fats and protein. Action Goal: continue same meal plan  Outcome goal: improved self confidence in diabetes meal planning Coordination of care: discussed care and diabetes medicine changes with doctor [place]   Teaching Method Utilized: Visual, Auditory,Hands on Handouts given during visit include:recipes and  After visit summary Barriers to  learning/adherence to lifestyle change: lack of material resources, depression lack of support Demonstrated degree of understanding via:  Teach Back   Monitoring/Evaluation:  Dietary intake, exercise, meter, and body weight in 2 month(s)  Debera Lat, RD 08/26/2022 11:21 AM. .

## 2022-08-26 NOTE — Assessment & Plan Note (Signed)
Blood pressure elevated at 171/73 today.  Patient has not been taking her medications since she was last here.  She denies any symptoms of hypertension or hypotension.  We will see her back in 1 to 2 weeks and discuss pressures in the setting of having restarted her medication.  -Will switch to combo amlodipine-olmesartan 10-20 mg daily -Follow-up in 1 to 2 weeks

## 2022-08-26 NOTE — Telephone Encounter (Addendum)
Referral request per patient for exercise program

## 2022-08-26 NOTE — Patient Instructions (Addendum)
  Angie Cain, it was a pleasure seeing you today!  Today we discussed: Diabetes: Take Xigduo 04-999 mg XR once a day. Continue taking your [place] and Trulicity. High blood pressure: Take amlodipine-olmesartan 10-20 mg daily   I have ordered the following labs today:  Lab Orders  No laboratory test(s) ordered today     Tests ordered today:  none  Referrals ordered today:   Referral Orders  No referral(s) requested today     I have ordered the following medication/changed the following medications:   Stop the following medications: Medications Discontinued During This Encounter  Medication Reason   amLODipine (NORVASC) 10 MG tablet Discontinued by provider   DULoxetine (CYMBALTA) 20 MG capsule Patient has not taken in last 30 days   metFORMIN (GLUCOPHAGE-XR) 500 MG 24 hr tablet Non-compliance   losartan (COZAAR) 50 MG tablet Discontinued by provider   atorvastatin (LIPITOR) 40 MG tablet Reorder     Start the following medications: Meds ordered this encounter  Medications   Dapagliflozin-metFORMIN HCl ER (XIGDUO XR) 04-999 MG TB24    Sig: Take 1 tablet by mouth daily.    Dispense:  30 tablet    Refill:  2   atorvastatin (LIPITOR) 40 MG tablet    Sig: Take 1 tablet (40 mg total) by mouth at bedtime.    Dispense:  90 tablet    Refill:  3   amlodipine-olmesartan (AZOR) 10-20 MG tablet    Sig: Take 1 tablet by mouth daily.    Dispense:  30 tablet    Refill:  2     Follow-up:  1-2 weeks    Please make sure to arrive 15 minutes prior to your next appointment. If you arrive late, you may be asked to reschedule.   We look forward to seeing you next time. Please call our clinic at [phone] if you have any questions or concerns. The best time to call is Monday-Friday from 9am-4pm, but there is someone available 24/7. If after hours or the weekend, call the main hospital number and ask for the Internal Medicine Resident On-Call. If you need medication refills,  please notify your pharmacy one week in advance and they will send [place] a request.  Thank you for letting us take part in your care. Wishing you the best!  Thank you, Linward Natal, MD

## 2022-08-26 NOTE — Patient Instructions (Signed)
Pamala Hurry,   [place]! What a change in your meal plan made on your blood sugars! If you need to add foods you can try things like beans, fish, chicken, [place], nuts, seeds, peanut butter, avocados, olives that have healthy fat and protein.   As we discussed, I suggest we follow up in 1-3 months depending in what you fell you need.  We can do the professional CGM again in about 2 months if you and your doctor think it is needed.   Thank you for your visit today and wearing the CGM!  See you soon !  Butch Penny [phone]

## 2022-08-26 NOTE — Progress Notes (Signed)
Internal Medicine Clinic Attending  I saw and evaluated the patient.  I personally confirmed the key portions of the history and exam documented by the resident  and I reviewed pertinent patient test results.  The assessment, diagnosis, and plan were formulated together and I agree with the documentation in the resident's note.  We downloaded Angie Cain's glucometer as well as CGM however the CGM is out of date interesting her glucometer checks are only in the morning and are fairly normal when we overlay this with her CGM data it looks like her only timing euglycemia is in the morning.  This is has me somewhat concerned for over basal dosing.  However interestingly Angie Cain is very forthcoming that she takes too many medications and she decided to stop the all after the last visit.  It appears there are multiple factors playing a role here 1 that she gets her medications from mail order and they keep sending to her even if she has the meds and to she feels there is a total of 2 many pills and she does not think she could be sick enough to justify all of these pills.  In this case to get some by and I discussed we will try to start over somewhat and decrease pill burden.  I would like her to start filling her medications at the [facility] where we will be able to get great fill data integrated into the EMR.  We will combine her ARB and calcium channel blocker into a single pill. She is somewhat distrustful of metformin, Jardiance is added as a "allergy" due to a UTI.  I discussed with her decreasing the pill burden from metformin and changing to [facility] which I think will give her benefit from the SGLT2 as well as 1000 mg of extended release metformin. We will continue her on Tresiba for now it is too complicated to add Humalog and she is not interested in this at this time.  I did ask her to let us know if she is experiencing any hypoglycemia as we will need to scale back her [place].

## 2022-09-04 ENCOUNTER — Other Ambulatory Visit: Payer: Self-pay | Admitting: Internal Medicine

## 2022-09-04 DIAGNOSIS — Z794 Long term (current) use of insulin: Secondary | ICD-10-CM

## 2022-09-06 ENCOUNTER — Emergency Department (HOSPITAL_COMMUNITY)
Admission: EM | Admit: 2022-09-06 | Discharge: 2022-09-07 | Disposition: A | Discharge status: Home / Self Care | Payer: Medicare Other | Attending: Emergency Medicine | Admitting: Emergency Medicine

## 2022-09-06 ENCOUNTER — Other Ambulatory Visit: Payer: Self-pay

## 2022-09-06 ENCOUNTER — Encounter (HOSPITAL_COMMUNITY): Payer: Self-pay | Admitting: Emergency Medicine

## 2022-09-06 DIAGNOSIS — R739 Hyperglycemia, unspecified: Secondary | ICD-10-CM | POA: Diagnosis not present

## 2022-09-06 DIAGNOSIS — E860 Dehydration: Secondary | ICD-10-CM | POA: Diagnosis not present

## 2022-09-06 DIAGNOSIS — E875 Hyperkalemia: Secondary | ICD-10-CM | POA: Diagnosis not present

## 2022-09-06 DIAGNOSIS — Z79899 Other long term (current) drug therapy: Secondary | ICD-10-CM | POA: Diagnosis not present

## 2022-09-06 DIAGNOSIS — Z743 Need for continuous supervision: Secondary | ICD-10-CM | POA: Diagnosis not present

## 2022-09-06 DIAGNOSIS — Z794 Long term (current) use of insulin: Secondary | ICD-10-CM | POA: Insufficient documentation

## 2022-09-06 DIAGNOSIS — Z7982 Long term (current) use of aspirin: Secondary | ICD-10-CM | POA: Insufficient documentation

## 2022-09-06 DIAGNOSIS — E871 Hypo-osmolality and hyponatremia: Secondary | ICD-10-CM | POA: Insufficient documentation

## 2022-09-06 DIAGNOSIS — R531 Weakness: Secondary | ICD-10-CM | POA: Diagnosis not present

## 2022-09-06 DIAGNOSIS — R404 Transient alteration of awareness: Secondary | ICD-10-CM | POA: Diagnosis not present

## 2022-09-06 DIAGNOSIS — E1165 Type 2 diabetes mellitus with hyperglycemia: Secondary | ICD-10-CM | POA: Diagnosis not present

## 2022-09-06 LAB — CBC
HCT: 45.1 % (ref 36.0–46.0)
Hemoglobin: 14.3 g/dL (ref 12.0–15.0)
MCH: 24.7 pg — ABNORMAL LOW (ref 26.0–34.0)
MCHC: 31.7 g/dL (ref 30.0–36.0)
MCV: 77.8 fL — ABNORMAL LOW (ref 80.0–100.0)
Platelets: 338 10*3/uL (ref 150–400)
RBC: 5.8 MIL/uL — ABNORMAL HIGH (ref 3.87–5.11)
RDW: 15.6 % — ABNORMAL HIGH (ref 11.5–15.5)
WBC: 6.5 10*3/uL (ref 4.0–10.5)
nRBC: 0 % (ref 0.0–0.2)

## 2022-09-06 LAB — BASIC METABOLIC PANEL
Anion gap: 15 (ref 5–15)
BUN: 22 mg/dL (ref 8–23)
CO2: 21 mmol/L — ABNORMAL LOW (ref 22–32)
Calcium: 9.8 mg/dL (ref 8.9–10.3)
Chloride: 93 mmol/L — ABNORMAL LOW (ref 98–111)
Creatinine, Ser: 1.08 mg/dL — ABNORMAL HIGH (ref 0.44–1.00)
GFR, Estimated: 56 mL/min — ABNORMAL LOW (ref >60)
Glucose, Bld: 910 mg/dL (ref 70–99)
Potassium: 5.2 mmol/L — ABNORMAL HIGH (ref 3.5–5.1)
Sodium: 129 mmol/L — ABNORMAL LOW (ref 135–145)

## 2022-09-06 LAB — CBG MONITORING, ED: Glucose-Capillary: >600 mg/dL (ref 70–99)

## 2022-09-06 NOTE — ED Notes (Signed)
Critical Glucose 910, informed provider

## 2022-09-06 NOTE — ED Triage Notes (Addendum)
Per EMS, pt has not taken her insulin, X2 weeks, some disagreement in traige w/ visitor why? Pt reports she took blood sugar yesterday and it was 90 yesterday.  C/O weakness, dehydration and fatigue.   CBG HI" 300NS in 20 G Left AC 142/74 HR 70 98% RA 18RR

## 2022-09-06 NOTE — ED Notes (Signed)
CBG Reads: HI

## 2022-09-07 DIAGNOSIS — R739 Hyperglycemia, unspecified: Secondary | ICD-10-CM | POA: Diagnosis not present

## 2022-09-07 LAB — BASIC METABOLIC PANEL
Anion gap: 13 (ref 5–15)
BUN: 21 mg/dL (ref 8–23)
CO2: 28 mmol/L (ref 22–32)
Calcium: 10.1 mg/dL (ref 8.9–10.3)
Chloride: 95 mmol/L — ABNORMAL LOW (ref 98–111)
Creatinine, Ser: 0.89 mg/dL (ref 0.44–1.00)
GFR, Estimated: >60 mL/min (ref >60)
Glucose, Bld: 470 mg/dL — ABNORMAL HIGH (ref 70–99)
Potassium: 4.4 mmol/L (ref 3.5–5.1)
Sodium: 136 mmol/L (ref 135–145)

## 2022-09-07 LAB — CBG MONITORING, ED
Glucose-Capillary: >600 mg/dL (ref 70–99)
Glucose-Capillary: 476 mg/dL — ABNORMAL HIGH (ref 70–99)
Glucose-Capillary: 471 mg/dL — ABNORMAL HIGH (ref 70–99)
Glucose-Capillary: 384 mg/dL — ABNORMAL HIGH (ref 70–99)

## 2022-09-07 LAB — URINALYSIS, ROUTINE W REFLEX MICROSCOPIC
Glucose, UA: >=500 mg/dL — AB
Hgb urine dipstick: NEGATIVE
Ketones, ur: NEGATIVE mg/dL
Leukocytes,Ua: NEGATIVE
Nitrite: NEGATIVE
Protein, ur: NEGATIVE mg/dL
Specific Gravity, Urine: <1.005 — ABNORMAL LOW (ref 1.005–1.030)
pH: 5.5 (ref 5.0–8.0)

## 2022-09-07 LAB — URINALYSIS, MICROSCOPIC (REFLEX)

## 2022-09-07 LAB — BETA-HYDROXYBUTYRIC ACID: Beta-Hydroxybutyric Acid: 1.32 mmol/L — ABNORMAL HIGH (ref 0.05–0.27)

## 2022-09-07 MED ORDER — INSULIN ASPART 100 UNIT/ML IJ SOLN
8.0000 [IU] | Freq: Once | INTRAMUSCULAR | Status: AC
  Administered 2022-09-07: 8 [IU] via SUBCUTANEOUS

## 2022-09-07 MED ORDER — LACTATED RINGERS IV BOLUS
20.0000 mL/kg | Freq: Once | INTRAVENOUS | Status: AC
  Administered 2022-09-07: 1134 mL via INTRAVENOUS

## 2022-09-07 MED ORDER — INSULIN ASPART 100 UNIT/ML IJ SOLN
10.0000 [IU] | Freq: Once | INTRAMUSCULAR | Status: AC
  Administered 2022-09-07: 10 [IU] via INTRAVENOUS

## 2022-09-07 MED ORDER — DEXTROSE IN LACTATED RINGERS 5 % IV SOLN: INTRAVENOUS | Status: DC

## 2022-09-07 MED ORDER — INSULIN REGULAR(HUMAN) IN NACL 100-0.9 UT/100ML-% IV SOLN: INTRAVENOUS | Status: DC

## 2022-09-07 MED ORDER — LACTATED RINGERS IV SOLN: INTRAVENOUS | Status: DC

## 2022-09-07 MED ORDER — DEXTROSE 50 % IV SOLN: 0.0000 mL | INTRAVENOUS | Status: DC | PRN

## 2022-09-07 NOTE — ED Provider Notes (Signed)
  Physical Exam  BP (!) 175/82 (BP Location: Right Arm)   Pulse 78   Temp 98.6 F (37 C) (Oral)   Resp 15   Ht '5\' 5"'$  (1.651 m)   Wt 56.7 kg   SpO2 100%   BMI 20.80 kg/m   Physical Exam  Procedures  Procedures  ED Course / MDM    Medical Decision Making Amount and/or Complexity of Data Reviewed Labs: ordered.  Risk Prescription drug management.   Received in signout.  Hyperglycemia due to noncompliance.  Initial sugar of 900 but come down.  Improved now.  He is on insulin at home and patient states she could manage.  Feeling better.  Will discharge.       Davonna Belling, MD 09/07/22 [phone]

## 2022-09-07 NOTE — ED Notes (Signed)
Discharge instructions reviewed with patient. Patient denies any questions or concerns.   

## 2022-09-07 NOTE — ED Provider Notes (Signed)
[facility] EMERGENCY DEPARTMENT Provider Note   CSN: [id number] Arrival date & time: 09/06/22  1930     History  Chief Complaint  Patient presents with   Hyperglycemia    Angie Cain is a 66 y.o. female.  Patient presents to the emergency department for evaluation of elevated blood sugar.  Patient reports feeling weak and dehydrated but no other specific complaints.  Patient reports that her blood sugars are elevated because "I have not been doing what I should do".       Home Medications Prior to Admission medications   Medication Sig Start Date End Date Taking? Authorizing Provider  Accu-Chek FastClix Lancets MISC CHECK BLOOD SUGAR 3 TIMES A DAY 06/27/21   Sanjuan Dame, MD  Alcohol Swabs (B-D SINGLE USE SWABS REGULAR) PADS 1 each by Does not apply route 6 (six) times daily. 02/26/20   Chundi, Verne Spurr, MD  ALEVE 220 MG tablet Take 220-440 mg by mouth 2 (two) times daily as needed (for pain).    [provider]  amlodipine-olmesartan (AZOR) 10-20 MG tablet Take 1 tablet by mouth daily. 08/26/22   Linward Natal, MD  aspirin EC 81 MG tablet Take 81 mg by mouth daily.    [provider]  atorvastatin (LIPITOR) 40 MG tablet Take 1 tablet (40 mg total) by mouth at bedtime. 08/26/22   Linward Natal, MD  Blood Glucose Calibration (ACCU-CHEK AVIVA) SOLN Use to check controls on diabetic glucose meter monthly 02/26/20   Chundi, Verne Spurr, MD  Blood Glucose Monitoring Suppl (ACCU-CHEK AVIVA PLUS) w/Device KIT Check blood sugar up to 3 times a day 02/26/20   Lars Mage, MD  calcium-vitamin D (OSCAL WITH D) 500-200 MG-UNIT tablet Take 1 tablet by mouth 2 (two) times daily. 03/17/19   Lars Mage, MD  cetirizine (ZYRTEC ALLERGY) 10 MG tablet Take 1 tablet (10 mg total) by mouth daily. 02/02/22   Piontek, Junie Panning, MD  Dapagliflozin-metFORMIN HCl ER (XIGDUO XR) 04-999 MG TB24 Take 1 tablet by mouth daily. 08/26/22   Linward Natal, MD  diclofenac Sodium  (VOLTAREN) 1 % GEL Apply 2 g topically 4 (four) times daily. Patient taking differently: Apply 2 g topically 4 (four) times daily as needed (for pain). 06/12/21   Rehman, Areeg N, DO  Dulaglutide (TRULICITY) 3 TW/6.5KC SOPN Inject 3 mg as directed once a week. 07/29/22   Lacinda Axon, MD  famotidine (PEPCID) 20 MG tablet Take 1 tablet (20 mg total) by mouth at bedtime. 02/02/22   Piontek, Junie Panning, MD  glucose blood (ACCU-CHEK GUIDE) test strip Check blood sugar 3 times per day 07/07/21   Lacinda Axon, MD  insulin degludec (TRESIBA FLEXTOUCH) 100 UNIT/ML FlexTouch Pen Inject 22 Units into the skin daily. 09/04/22   Lacinda Axon, MD  Insulin Pen Needle 32G X 4 MM MISC Use to inject insulin once daily at bedtime 03/18/22   Atway, Rayann N, DO  Insulin Pen Needle 32G X 4 MM MISC Use to inject insulin once daily at bedtime 07/31/22   Lacinda Axon, MD  latanoprost (XALATAN) 0.005 % ophthalmic solution PLACE 1 DROP INTO BOTH EYES AT BEDTIME. 06/27/21   Sanjuan Dame, MD  mupirocin ointment (BACTROBAN) 2 % Apply 1 application topically daily. 02/02/22   Rondel Oh, MD      Allergies    Bupropion, Jardiance [empagliflozin], Metronidazole, Shellfish allergy, and Shrimp extract allergy skin test    Review of Systems   Review of Systems  Physical Exam Updated Vital Signs  BP (!) 175/82 (BP Location: Right Arm)   Pulse 78   Temp 98.6 F (37 C) (Oral)   Resp 15   Ht '5\' 5"'  (1.651 m)   Wt 56.7 kg   SpO2 100%   BMI 20.80 kg/m  Physical Exam Vitals and nursing note reviewed.  Constitutional:      General: She is not in acute distress.    Appearance: She is well-developed.  HENT:     Head: Normocephalic and atraumatic.     Mouth/Throat:     Mouth: Mucous membranes are moist.  Eyes:     General: Vision grossly intact. Gaze aligned appropriately.     Extraocular Movements: Extraocular movements intact.     Conjunctiva/sclera: Conjunctivae normal.  Cardiovascular:     Rate  and Rhythm: Normal rate and regular rhythm.     Pulses: Normal pulses.     Heart sounds: Normal heart sounds, S1 normal and S2 normal. No murmur heard.    No friction rub. No gallop.  Pulmonary:     Effort: Pulmonary effort is normal. No respiratory distress.     Breath sounds: Normal breath sounds.  Abdominal:     General: Bowel sounds are normal.     Palpations: Abdomen is soft.     Tenderness: There is no abdominal tenderness. There is no guarding or rebound.     Hernia: No hernia is present.  Musculoskeletal:        General: No swelling.     Cervical back: Full passive range of motion without pain, normal range of motion and neck supple. No spinous process tenderness or muscular tenderness. Normal range of motion.     Right lower leg: No edema.     Left lower leg: No edema.  Skin:    General: Skin is warm and dry.     Capillary Refill: Capillary refill takes less than 2 seconds.     Findings: No ecchymosis, erythema, rash or wound.  Neurological:     General: No focal deficit present.     Mental Status: She is alert and oriented to person, place, and time.     GCS: GCS eye subscore is 4. GCS verbal subscore is 5. GCS motor subscore is 6.     Cranial Nerves: Cranial nerves 2-12 are intact.     Sensory: Sensation is intact.     Motor: Motor function is intact.     Coordination: Coordination is intact.  Psychiatric:        Attention and Perception: Attention normal.        Mood and Affect: Mood normal.        Speech: Speech normal.        Behavior: Behavior normal.     ED Results / Procedures / Treatments   Labs (all labs ordered are listed, but only abnormal results are displayed) Labs Reviewed  BASIC METABOLIC PANEL - Abnormal; Notable for the following components:      Result Value   Sodium 129 (*)    Potassium 5.2 (*)    Chloride 93 (*)    CO2 21 (*)    Glucose, Bld 910 (*)    Creatinine, Ser 1.08 (*)    GFR, Estimated 56 (*)    All other components within  normal limits  CBC - Abnormal; Notable for the following components:   RBC 5.80 (*)    MCV 77.8 (*)    MCH 24.7 (*)    RDW 15.6 (*)    All other  components within normal limits  URINALYSIS, ROUTINE W REFLEX MICROSCOPIC - Abnormal; Notable for the following components:   APPearance HAZY (*)    Specific Gravity, Urine <1.005 (*)    Glucose, UA >=500 (*)    Bilirubin Urine SMALL (*)    All other components within normal limits  BETA-HYDROXYBUTYRIC ACID - Abnormal; Notable for the following components:   Beta-Hydroxybutyric Acid 1.32 (*)    All other components within normal limits  BASIC METABOLIC PANEL - Abnormal; Notable for the following components:   Chloride 95 (*)    Glucose, Bld 470 (*)    All other components within normal limits  URINALYSIS, MICROSCOPIC (REFLEX) - Abnormal; Notable for the following components:   Bacteria, UA RARE (*)    All other components within normal limits  CBG MONITORING, ED - Abnormal; Notable for the following components:   Glucose-Capillary >600 (*)    All other components within normal limits  CBG MONITORING, ED - Abnormal; Notable for the following components:   Glucose-Capillary >600 (*)    All other components within normal limits  CBG MONITORING, ED - Abnormal; Notable for the following components:   Glucose-Capillary 471 (*)    All other components within normal limits  CBG MONITORING, ED - Abnormal; Notable for the following components:   Glucose-Capillary 476 (*)    All other components within normal limits  CBG MONITORING, ED - Abnormal; Notable for the following components:   Glucose-Capillary 384 (*)    All other components within normal limits  OSMOLALITY    EKG None  Radiology No results found.  Procedures Procedures    Medications Ordered in ED Medications  dextrose 50 % solution 0-50 mL (has no administration in time range)  insulin aspart (novoLOG) injection 8 Units (has no administration in time range)  lactated  ringers bolus 1,134 mL (1,134 mLs Intravenous New Bag/Given 09/07/22 0557)  insulin aspart (novoLOG) injection 10 Units (10 Units Intravenous Given 09/07/22 0556)    ED Course/ Medical Decision Making/ A&P                           Medical Decision Making Amount and/or Complexity of Data Reviewed Labs: ordered.  Risk Prescription drug management.   Patient presents for elevated blood sugar.  Blood sugar on her initial chemistry was 910.  No acidosis or anion gap.    Patient was in the waiting room for a prolonged period of time, reports that she drank a lot of water.  Sugar at arrival to the exam room is 471.  Basic chemistry reveals slightly elevated potassium and pseudohyponatremia.  Patient to be administered IV insulin and fluid bolus.  We will continue to work on getting sugar down, will be appropriate for discharge.  Signout oncoming ER physician to follow progress.        Final Clinical Impression(s) / ED Diagnoses Final diagnoses:  Hyperglycemia    Rx / DC Orders ED Discharge Orders     None         Carston Riedl, Gwenyth Allegra, MD 09/07/22 [phone]

## 2022-09-09 ENCOUNTER — Ambulatory Visit (INDEPENDENT_AMBULATORY_CARE_PROVIDER_SITE_OTHER): Payer: Medicare Other | Admitting: Internal Medicine

## 2022-09-09 ENCOUNTER — Encounter: Payer: Self-pay | Admitting: Internal Medicine

## 2022-09-09 ENCOUNTER — Other Ambulatory Visit: Payer: Self-pay

## 2022-09-09 ENCOUNTER — Other Ambulatory Visit (HOSPITAL_COMMUNITY)
Admission: RE | Admit: 2022-09-09 | Discharge: 2022-09-09 | Disposition: A | Discharge status: Home / Self Care | Payer: Medicare Other | Source: Ambulatory Visit | Attending: Internal Medicine | Admitting: Internal Medicine

## 2022-09-09 VITALS — BP 144/75 | HR 77 | Temp 98.0°F | Resp 32 | Ht 65.0 in | Wt 124.5 lb

## 2022-09-09 DIAGNOSIS — N390 Urinary tract infection, site not specified: Secondary | ICD-10-CM

## 2022-09-09 DIAGNOSIS — Z23 Encounter for immunization: Secondary | ICD-10-CM

## 2022-09-09 DIAGNOSIS — Z91148 Patient's other noncompliance with medication regimen for other reason: Secondary | ICD-10-CM

## 2022-09-09 DIAGNOSIS — E1165 Type 2 diabetes mellitus with hyperglycemia: Secondary | ICD-10-CM | POA: Diagnosis not present

## 2022-09-09 DIAGNOSIS — Z659 Problem related to unspecified psychosocial circumstances: Secondary | ICD-10-CM

## 2022-09-09 DIAGNOSIS — Z794 Long term (current) use of insulin: Secondary | ICD-10-CM

## 2022-09-09 LAB — URINALYSIS, MICROSCOPIC (REFLEX): RBC / HPF: NONE SEEN RBC/hpf (ref 0–5)

## 2022-09-09 LAB — URINALYSIS, ROUTINE W REFLEX MICROSCOPIC
Bilirubin Urine: NEGATIVE
Glucose, UA: >=500 mg/dL — AB
Hgb urine dipstick: NEGATIVE
Ketones, ur: NEGATIVE mg/dL
Leukocytes,Ua: NEGATIVE
Nitrite: NEGATIVE
Protein, ur: NEGATIVE mg/dL
Specific Gravity, Urine: <1.005 — ABNORMAL LOW (ref 1.005–1.030)
pH: 6 (ref 5.0–8.0)

## 2022-09-09 LAB — BASIC METABOLIC PANEL
Anion gap: 6 (ref 5–15)
BUN: 14 mg/dL (ref 8–23)
CO2: 29 mmol/L (ref 22–32)
Calcium: 8.8 mg/dL — ABNORMAL LOW (ref 8.9–10.3)
Chloride: 96 mmol/L — ABNORMAL LOW (ref 98–111)
Creatinine, Ser: 0.81 mg/dL (ref 0.44–1.00)
GFR, Estimated: >60 mL/min (ref >60)
Glucose, Bld: 612 mg/dL (ref 70–99)
Potassium: 3.4 mmol/L — ABNORMAL LOW (ref 3.5–5.1)
Sodium: 131 mmol/L — ABNORMAL LOW (ref 135–145)

## 2022-09-09 LAB — GLUCOSE, CAPILLARY: Glucose-Capillary: 592 mg/dL (ref 70–99)

## 2022-09-09 LAB — HM DIABETES EYE EXAM

## 2022-09-09 MED ORDER — INSULIN LISPRO 100 UNIT/ML IJ SOLN: 10.0000 [IU] | Freq: Once | INTRAMUSCULAR | Status: DC

## 2022-09-09 MED ORDER — INSULIN ASPART 100 UNIT/ML IJ SOLN
10.0000 [IU] | Freq: Once | INTRAMUSCULAR | Status: AC
  Administered 2022-09-09: 10 [IU] via SUBCUTANEOUS

## 2022-09-09 NOTE — Assessment & Plan Note (Signed)
Patient with several chronic conditions including diabetes, hypertension, and HLD. She was recently seen in the [facility] clinic 1 week ago where medication adherence was main issue addressed. She had  Been having significant difficulty adhering to any of her medications. She expressed frustration over volume of pills and stated that she did not believe she was as sick as her number of pills would suggest. At that visit, medication regimen was overhauled to facilitate compliance. Despite changes, she reports that she has not taken a single one of her medications. She reports feeling very confused, frustrated, and stressed over medications stating she doesn't want to take her medications, doesn't know how, and doesn't know what they are for. Also reports that she is very concerned over potential side effects. States that she feels as though family should be doing her medications for her. She thinks it may be beneficial for doctor to direct her to take one single medicine to overcome mental/emotional barrier.  It seems as though there is a strong psychosocial component to her medication non-adherence. She has previously seen diabetes education as well as Dr. Theodis Shove for behavioral health. Despite this, she has has not been taking any of her diabetes or psych medications either.  Will consult to chronic care management for assistance. She may also benefit from [facility] nurse to administer medications.  - consult social work - consult chronic care

## 2022-09-09 NOTE — Patient Instructions (Addendum)
Dear Mrs. Bellina,  Thank you for trusting [place] with your care today. We discussed your diabetes medicines.   TAKE YOUR XIGDUO EVERY DAY AT 9AM. DRINK PLENTY OF WATER. CALL OUR OFFICE IF YOUR SUGAR IS MORE THAN 400. COME BACK TO THE CLINIC IN 1 WEEK.

## 2022-09-09 NOTE — Progress Notes (Signed)
CC: medications  HPI:Angie Cain is a 66 y.o. female who presents for evaluation of medication regimen. Please see individual problem based A/P for details.  Patient with several chronic conditions including diabetes, hypertension, and HLD. She was recently seen in the [facility] clinic 1 week ago where medication adherence was main issue addressed. She had  Been having significant difficulty adhering to any of her medications. She expressed frustration over volume of pills and stated that she did not believe she was as sick as her number of pills would suggest. At that visit, medication regimen was overhauled to facilitate compliance. Despite changes, she reports that she has not taken a single one of her medications. She reports feeling very confused, frustrated, and stressed over medications stating she doesn't want to take her medications, doesn't know how, and doesn't know what they are for. Also reports that she is very concerned over potential side effects. States that she feels as though family should be doing her medications for her. She thinks it may be beneficial for doctor to direct her to take one single medicine to overcome mental/emotional barrier.  Patient recently seen in clinic for medication non-adherence. Diabetes regimen was simplified and sent to outpatient pharmacy to better gauge refill data. Despite this, patient has not taken any of her medicines. She was seen in the ED for symptomatic hyperglycemia two days ago and discharged home. Today in clinic, she reports blurry vision, feeling very thirsty, frequent urination,  and itching of urinary tract.    Depression, PHQ-9: Based on the patients  [place] Visit from 08/05/2022 in [place]  PHQ-9 Total Score 4      score we have .  Past Medical History:  Diagnosis Date   Allergy    Anxiety    occasional   Arthritis    Atherosclerotic peripheral vascular disease with intermittent  claudication ([place])    s/p fem-fem bypass 2011. ABI 07/2012 0.5 R 0.65 L   Diabetes mellitus without complication (HCC)    GERD (gastroesophageal reflux disease)    Hyperlipidemia    Hypertension    IDDM (insulin dependent diabetes mellitus) 07/21/2007   July 2011: Femoral-femoral bypass  ABI 08/11/2012 notable for 0.4-0.59 on right ankle suggestive of severe arterial occlusive disease at rest and 0.60-0.7 on left ankle suggestive of moderate arterial occlusive disease at rest.    Iron deficiency anemia 05/08/2017   Low back pain 12/18/2013   Personal history of colonic polyps - adenomas 03/07/2014   Rotator cuff impingement syndrome of left shoulder 03/23/2016   Tobacco abuse    Review of Systems:   Review of Systems  Genitourinary:  Positive for frequency.  Endo/Heme/Allergies:  Positive for polydipsia.     Physical Exam: There were no vitals filed for this visit.   General: NAD HEENT: dry mucus membranes Cardiovascular: Normal rate, regular rhythm.  No murmurs, rubs, or gallops Pulmonary : Equal breath sounds, No wheezes, rales, or rhonchi Abdominal: soft, nontender,  bowel sounds present Ext: No edema in lower extremities, no tenderness to palpation of lower extremities.  Skin: poor skin turgor  Assessment & Plan:   See Encounters Tab for problem based charting.  Symptomatic hyperglycemia seems most likely at this time. Patient does not appear to have the profound dehydration with HHS I would expect and likely does not need IV fluids at this time. Low suspicion for DKA and negative GAP does not support DKA either. Her unrinary symptoms may be related to glucosuria.  Although patient would likely benefit from IV fluids and insulin administration in the ED, she was recently seen just two days ago and feel as though no progress would  Be made regarding medication adherence and current cycle will continue. Instead administered 10units novolog in clinic for hyperglycemia and instructed  patient to take just her Xigduo at home. Although her other medications are important, these will need to be slowly added on as patient is able. We will plan to see her back in clinic in 1 week to re-assess. - 10 units novolog - BMP - UA - home Xigduo - adequate oral hydration - return clinic 1 week  It seems as though there is a strong psychosocial component to her medication non-adherence. She has previously seen diabetes education as well as Dr. Theodis Shove for behavioral health. Despite this, she has has not been taking any of her diabetes or psych medications either.  Will consult to chronic care management for assistance. She may also benefit from [facility] nurse to administer medications.  - consult social work - consult chronic care  Patient seen with Dr.  Saverio Danker

## 2022-09-09 NOTE — Assessment & Plan Note (Addendum)
Patient recently seen in clinic for medication non-adherence. Diabetes regimen was simplified and sent to outpatient pharmacy to better gauge refill data. Despite this, patient has not taken any of her medicines. She was seen in the ED for symptomatic hyperglycemia two days ago and discharged home. Today in clinic, she reports blurry vision, feeling very thirsty, frequent urination,  and itching of urinary tract.   CBG checked 592. BMP showed pseudohyponatremia. No GAP.   Symptomatic hyperglycemia seems most likely at this time. Patient does not appear to have the profound dehydration with HHS I would expect and likely does not need IV fluids at this time. Low suspicion for DKA and negative GAP does not support DKA either. Her unrinary symptoms may be related to glucosuria. Although patient would likely benefit from IV fluids and insulin administration in the ED, she was recently seen just two days ago and feel as though no progress would  Be made regarding medication adherence and current cycle will continue. Instead administered 10units novolog in clinic for hyperglycemia and instructed patient to take just her Xigduo at home. Although her other medications are important, these will need to be slowly added on as patient is able. We will plan to see her back in clinic in 1 week to re-assess. - 10 units novolog - BMP - UA - home Xigduo - adequate oral hydration - return clinic 1 week

## 2022-09-10 ENCOUNTER — Telehealth: Payer: Self-pay | Admitting: Dietician

## 2022-09-10 ENCOUNTER — Other Ambulatory Visit: Payer: Self-pay | Admitting: Internal Medicine

## 2022-09-10 DIAGNOSIS — E1169 Type 2 diabetes mellitus with other specified complication: Secondary | ICD-10-CM

## 2022-09-10 LAB — CERVICOVAGINAL ANCILLARY ONLY
Bacterial Vaginitis (gardnerella): NEGATIVE
Candida Glabrata: NEGATIVE
Candida Vaginitis: POSITIVE — AB
Chlamydia: NEGATIVE
Comment: NEGATIVE
Comment: NEGATIVE
Comment: NEGATIVE
Comment: NEGATIVE
Comment: NEGATIVE
Comment: NORMAL
Neisseria Gonorrhea: NEGATIVE
Trichomonas: NEGATIVE

## 2022-09-10 NOTE — Telephone Encounter (Signed)
Left voicemail for return call  

## 2022-09-11 ENCOUNTER — Other Ambulatory Visit (HOSPITAL_COMMUNITY): Payer: Self-pay

## 2022-09-11 NOTE — Progress Notes (Signed)
Contacted patient regarding abnormal test results. Will plan on sending in Diflucan to treat yeast infection. Also discussed medication regimen with patient. Patient confirms understanding of plan.

## 2022-09-14 ENCOUNTER — Other Ambulatory Visit (HOSPITAL_COMMUNITY): Payer: Self-pay

## 2022-09-14 MED ORDER — FLUCONAZOLE 150 MG PO TABS
150.0000 mg | ORAL_TABLET | Freq: Every day | ORAL | 0 refills | Status: DC
  Filled 2022-09-14: qty 2, 3d supply, fill #0

## 2022-09-14 NOTE — Telephone Encounter (Signed)
Called for diabetes support: she states " Not taking any insulin, but taking medicine xigduo, amlodipine, not trulicity. Will start Trulicity has 5.25 mg, atovastatin, asa,  losartan and calcium Blood sugar "up and up". 200s.  Feeling better,   has an Eye appointment for 10-4 with Dr. Katy Fitch, cataract eval. " Agreed to call in am to schedule appointment with doctor and myself on the same day.

## 2022-09-14 NOTE — Addendum Note (Signed)
Addended by: Delene Ruffini T on: 09/14/2022 08:08 AM   Modules accepted: Orders

## 2022-09-15 ENCOUNTER — Telehealth: Payer: Self-pay | Admitting: *Deleted

## 2022-09-15 NOTE — Chronic Care Management (AMB) (Signed)
  Care Coordination  Outreach Note  09/15/2022 Name: Angie Cain MRN: [id number] DOB: 1956-03-15   Care Coordination Outreach Attempts: An unsuccessful telephone outreach was attempted today to offer the patient information about available care coordination services as a benefit of their health plan.   Follow Up Plan:  Additional outreach attempts will be made to offer the patient care coordination information and services.   Encounter Outcome:  No Answer  [place]  Direct Dial: [phone]

## 2022-09-15 NOTE — Progress Notes (Signed)
Internal Medicine Clinic Attending  I saw and evaluated the patient.  I personally confirmed the key portions of the history and exam documented by Dr. Elliot Gurney and I reviewed pertinent patient test results.  The assessment, diagnosis, and plan were formulated together and I agree with the documentation in the resident's note. Difficult situation as Angie Cain expresses significant confusion over medications even with extensive counseling today and by multiple previous providers. We have decided to start slowly with encouraging adherence to one medication at a time. We have instructed Angie Cain to take [place] daily and return in one week for f/u. Will add medications as able to avoid severe hyperglycemia but avoid medication burden and patient intolerance of complex regimen.

## 2022-09-16 DIAGNOSIS — H0102A Squamous blepharitis right eye, upper and lower eyelids: Secondary | ICD-10-CM | POA: Diagnosis not present

## 2022-09-16 DIAGNOSIS — H0102B Squamous blepharitis left eye, upper and lower eyelids: Secondary | ICD-10-CM | POA: Diagnosis not present

## 2022-09-16 DIAGNOSIS — H401131 Primary open-angle glaucoma, bilateral, mild stage: Secondary | ICD-10-CM | POA: Diagnosis not present

## 2022-09-16 DIAGNOSIS — E119 Type 2 diabetes mellitus without complications: Secondary | ICD-10-CM | POA: Diagnosis not present

## 2022-09-16 DIAGNOSIS — H04123 Dry eye syndrome of bilateral lacrimal glands: Secondary | ICD-10-CM | POA: Diagnosis not present

## 2022-09-16 DIAGNOSIS — H1045 Other chronic allergic conjunctivitis: Secondary | ICD-10-CM | POA: Diagnosis not present

## 2022-09-16 DIAGNOSIS — H25813 Combined forms of age-related cataract, bilateral: Secondary | ICD-10-CM | POA: Diagnosis not present

## 2022-09-16 LAB — HM DIABETES EYE EXAM

## 2022-09-21 NOTE — Chronic Care Management (AMB) (Signed)
  Care Coordination  Outreach Note  09/21/2022 Name: Angie Cain MRN: [id number] DOB: 04-Mar-1956   Care Coordination Outreach Attempts: A second unsuccessful outreach was attempted today to offer the patient with information about available care coordination services as a benefit of their health plan.    Referral  Follow Up Plan:  Additional outreach attempts will be made to offer the patient care coordination information and services.   Encounter Outcome:  No Answer  [place]  Direct Dial: [phone]

## 2022-09-24 NOTE — Chronic Care Management (AMB) (Signed)
  Care Coordination  Outreach Note  09/24/2022 Name: Angie Cain MRN: [id number] DOB: 10-14-1956   Care Coordination Outreach Attempts: A third unsuccessful outreach was attempted today to offer the patient with information about available care coordination services as a benefit of their health plan.   Follow Up Plan:  No further outreach attempts will be made at this time. We have been unable to contact the patient to offer or enroll patient in care coordination services  Encounter Outcome:  No Answer  [place] Level: [phone]

## 2022-09-28 ENCOUNTER — Other Ambulatory Visit (HOSPITAL_COMMUNITY): Payer: Self-pay

## 2022-09-28 ENCOUNTER — Telehealth: Payer: Self-pay | Admitting: Student

## 2022-09-28 ENCOUNTER — Encounter: Payer: Self-pay | Admitting: Dietician

## 2022-09-28 NOTE — Telephone Encounter (Signed)
Pt calling to report her BS having been running in the 400's x 1 week.  Pt reports she feels sluggish, body feels weak, fatigue, lower back pain, eye sight seems different, can't hold items.   Pt states she needs help with taking her medications. Pt states she needs help with knowing how and when she should aking her medicine.  Pt reports since she started taking the all this new medicine she has been feeling this way and would like a call back.    Dapagliflozin-metFORMIN HCl ER (XIGDUO XR) 04-999 MG TB24

## 2022-09-28 NOTE — Telephone Encounter (Signed)
Returned call to patient: she says she is "not good, not good at all, needs to sit at the table and talk to a doctor to figure out what she is supposed to be on", she says that Medicaid is not paying for one medicine and blood sugar  is 400 .  "Is a basket case worried about her eyes", stopped insulin because she says she was told to stop it, no energy, doesn't want to do anything. She says she wants a "strict regimen".  She states that two more boxes delivered from pharmacy and that threw her for a loop, she wants that to stop.  She agreed to an appointment tomorrow if possible, she does not think next week or November is soon enough.   Call to [facility] pharmacy: 4/65 trulicty 2 month supply- is due, [facility]- mail order, 9/13 xigduo due 1 month supply- is due  Call to mail order about [facility]: Cycle date is the 18th, shipped out, [employer], arrived 9/27 10 am   Patient informed of appointment at 8:45 AM with Dr. Jeanett Schlein and to bring her meter and all of her medicines. She was advised to continue to drink plenty of water and continue checking her blood sugar often. She verbalized understanding.

## 2022-09-29 ENCOUNTER — Other Ambulatory Visit (HOSPITAL_COMMUNITY): Payer: Self-pay

## 2022-09-29 ENCOUNTER — Ambulatory Visit (INDEPENDENT_AMBULATORY_CARE_PROVIDER_SITE_OTHER): Payer: Medicare Other | Admitting: Family Medicine

## 2022-09-29 ENCOUNTER — Encounter: Payer: Self-pay | Admitting: Family Medicine

## 2022-09-29 VITALS — BP 119/75 | HR 60 | Temp 98.2°F | Ht 65.0 in | Wt 120.7 lb

## 2022-09-29 DIAGNOSIS — B370 Candidal stomatitis: Secondary | ICD-10-CM | POA: Insufficient documentation

## 2022-09-29 DIAGNOSIS — E1165 Type 2 diabetes mellitus with hyperglycemia: Secondary | ICD-10-CM

## 2022-09-29 DIAGNOSIS — Z794 Long term (current) use of insulin: Secondary | ICD-10-CM

## 2022-09-29 DIAGNOSIS — F17210 Nicotine dependence, cigarettes, uncomplicated: Secondary | ICD-10-CM

## 2022-09-29 DIAGNOSIS — I10 Essential (primary) hypertension: Secondary | ICD-10-CM | POA: Diagnosis not present

## 2022-09-29 HISTORY — DX: Candidal stomatitis: B37.0

## 2022-09-29 LAB — GLUCOSE, CAPILLARY: Glucose-Capillary: 559 mg/dL (ref 70–99)

## 2022-09-29 MED ORDER — NYSTATIN 100000 UNIT/ML MT SUSP
5.0000 mL | Freq: Four times a day (QID) | OROMUCOSAL | 0 refills | Status: DC
  Filled 2022-09-29: qty 200, 10d supply, fill #0

## 2022-09-29 NOTE — Patient Instructions (Signed)
We discussed today the treatment regimen for diabetes. You will take [place] in the morning and once a week injection of Trulicity. Continue to monitor blood sugar and blood pressure at home The medication for oral thrush was sent to the pharmacy Follow-up in 1 week

## 2022-09-29 NOTE — Assessment & Plan Note (Signed)
Patient has thick, white, slightly raised plaque on the tongue and left buccal mucosa.  Exam findings consistent with oral thrush.  Poorly controlled [place] is likely the cause for oral thrush. Plan: -nystatin oral suspension: 5 mL  X 4 times a day .swish and keep in the mouth for 30 seconds and spit.

## 2022-09-29 NOTE — Assessment & Plan Note (Signed)
Patient on amlodipine/olmesartan.  Patient do not monitor blood pressure at home.  Patient reports she is compliant with her blood pressure medication.  Her blood pressure 119/75.  Patient's blood pressure is at goal. Continue amlodipine/olmesartan. -Patient counseled to monitor blood pressure at home.

## 2022-09-29 NOTE — Assessment & Plan Note (Signed)
Patient recently visited emergency department for feeling weak and dehydrated. Her CBG was above 900. She were thought to be non adherent to diabetes management. Patient were treated with IV fluids and insulin.  Her blood sugar improved and patient was discharged. Patient followed up in the [facility] on September 27, 2 days after the hospital discharge. Patient were symptomatic, endorsed fatigue, blurry vision, polyuria, polydipsia and vaginal itching.  BMP reflected no acidosis or gap.  No AKI. Presentation and findings were suggestive of HHS.  Patient were given 10 units of Novolog in the clinic. Pt tested positive for vaginal candidiasis and was treated with Diflucan.  Her vaginal symptoms improved with Diflucan.    It appears the issue is medication adherence/compliance issue.  Patient thinks there are a lot of medications for her diabetes and she do not think she needs all.  Extensive counseling on importance of adherence to the prescribed treatment were provided on the previous visit.  Today upon communication with the patient.  She still endorsed " abdominal what medications to take.  I was told not to take insulin".  Patient stated she takes [place] some days.  Patient stated she is confused about her treatment regimen and the frequency how she should be taking her diabetes medications.  Patient stated she is compliant with her antihypertensive and all the other other prescription medications but diabetes medications.   Patient complaining of fatigue, generalized body aches, mild lightheadedness.  Patient admitted she is not taking her diabetes medications as prescribed.  Patient also complaining of thick white deposit on the tongue resulting in altered taste.  Today CBG 559.  Patient denies headache, blurry vision, abdominal pain, vomiting or diarrhea.  Normal skin turgor and moist mucous membranes.  No obvious signs of dehydration.  Patient is comfortably communicating.  No acute visible distress.  Vitals  stable.  Extensive counseling and education provided to the patient with focus on the importance of medication adherence and monitoring blood glucose at home.  Diabetes treatment plan was simplified with patient having understanding which medications to take including the frequency and the time.  Patient will take [place] in the morning and Trulicity once a week on Tuesday.  Phone reminder was set on the patient's phone to prompt the patient to take the medication.  Patient clearly understood how she is suppose to take her diabetes medications.  Patient reiterated the treatment regimen.  It appears patient clearly understood.  Patient was also provided with a written instructions.  Patient will return to clinic in 1 week for reevaluation.  Patient agrees with the plan Plan: Start all diabetes medications. Kenard Gower in the morning -Trulicity once a week on Tuesday

## 2022-09-29 NOTE — Progress Notes (Signed)
CC: FU for diabetes evaluation and medication management  HPI: Angie Cain is a 66 y.o. female with past medical history listed below presenting to [facility] for diabetes evaluation and medication management. For details of today's visit and the status of his chronic medical issues please refer to the assessment and plan.   Patient recently visited emergency department for feeling weak and dehydrated. Her CBG was above 900. She were thought to be non adherent to diabetes management. Patient were treated with IV fluids and insulin.  Her blood sugar improved and patient was discharged. Patient followed up in the [facility] on September 27, 2 days after the hospital discharge. Patient were symptomatic, endorsed fatigue, blurry vision, polyuria, polydipsia and vaginal itching.  BMP reflected no acidosis or gap.  No AKI. Presentation and findings were suggestive of HHS.  Patient were given 10 units of Novolog in the clinic. Pt tested positive for vaginal candidiasis and was treated with Diflucan.  Her vaginal symptoms improved with Diflucan.    It appears the issue is medication adherence/compliance issue.  Patient thinks there are a lot of medications for her diabetes and she do not think she needs all.  Extensive counseling on importance of adherence to the prescribed treatment were provided on the previous visit.  Today upon communication with the patient.  She still endorsed " abdominal what medications to take.  I was told not to take insulin".  Patient stated she takes [place] some days.  Patient stated she is confused about her treatment regimen and the frequency how she should be taking her diabetes medications.  Patient stated she is compliant with her antihypertensive and all the other other prescription medications but diabetes medications.   Patient complaining of fatigue, generalized body aches, mild lightheadedness.  Patient admitted she is not taking her diabetes medications as prescribed.  Patient  also complaining of thick white deposit on the tongue resulting in altered taste.  Today CBG 559.  Patient denies headache, blurry vision, abdominal pain, vomiting or diarrhea.  Normal skin turgor and moist mucous membranes.  No obvious signs of dehydration.  Patient is comfortably communicating.  No acute visible distress.  Vitals stable.   Past Medical History:  Diagnosis Date   Allergy    Anxiety    occasional   Arthritis    Atherosclerotic peripheral vascular disease with intermittent claudication ([place])    s/p fem-fem bypass 2011. ABI 07/2012 0.5 R 0.65 L   Diabetes mellitus without complication (HCC)    GERD (gastroesophageal reflux disease)    Hyperlipidemia    Hypertension    IDDM (insulin dependent diabetes mellitus) 07/21/2007   July 2011: Femoral-femoral bypass  ABI 08/11/2012 notable for 0.4-0.59 on right ankle suggestive of severe arterial occlusive disease at rest and 0.60-0.7 on left ankle suggestive of moderate arterial occlusive disease at rest.    Iron deficiency anemia 05/08/2017   Low back pain 12/18/2013   Personal history of colonic polyps - adenomas 03/07/2014   Rotator cuff impingement syndrome of left shoulder 03/23/2016   Tobacco abuse    Review of Systems: Review of Systems  Constitutional:  Positive for malaise/fatigue.  HENT:         Thick white deposit on the tongue.  Respiratory:  Negative for shortness of breath.   Cardiovascular:  Negative for chest pain.  Gastrointestinal:  Negative for abdominal pain.  Skin:  Negative for itching and rash.  Neurological:  Negative for dizziness and headaches.       Lightheaded  Physical Exam: Physical Exam Vitals and nursing note reviewed.  Constitutional:      Appearance: Normal appearance.  HENT:     Head: Normocephalic.     Mouth/Throat:     Mouth: Mucous membranes are moist.     Comments: Thick white, plaques, slightly raised to the tongue and to left buccal mucosa  Eyes:     Conjunctiva/sclera:  Conjunctivae normal.  Cardiovascular:     Rate and Rhythm: Normal rate and regular rhythm.  Pulmonary:     Effort: Pulmonary effort is normal.     Breath sounds: Normal breath sounds.  Musculoskeletal:     Cervical back: Neck supple.  Skin:    General: Skin is warm.     Capillary Refill: Capillary refill takes less than 2 seconds.  Neurological:     Mental Status: She is alert and oriented to person, place, and time.  Psychiatric:        Mood and Affect: Mood normal.        Behavior: Behavior normal.      Vitals:   09/29/22 0901  BP: 119/75  Pulse: 60  Temp: 98.2 F (36.8 C)  TempSrc: Oral  SpO2: 100%  Weight: 120 lb 11.2 oz (54.7 kg)  Height: '5\' 5"'$  (1.651 m)     Assessment & Plan:   See Encounters Tab for problem based charting.  Patient seen with Dr. Evette Doffing

## 2022-09-30 ENCOUNTER — Telehealth: Payer: Self-pay | Admitting: Family Medicine

## 2022-09-30 NOTE — Progress Notes (Signed)
Internal Medicine Clinic Attending  I saw and evaluated the patient.  I personally confirmed the key portions of the history and exam documented by Dr. Multani and I reviewed pertinent patient test results.  The assessment, diagnosis, and plan were formulated together and I agree with the documentation in the resident's note.  

## 2022-09-30 NOTE — Telephone Encounter (Signed)
Called patient to discuss the mammogram results. Pt was informed mammogram results were negative for malignancy.  I also spoke with the patient on her diabetes treatment regimen. Pt stated she is taking Xigduo and injecting [place] in the morning and started Trulicity yesterday when she got home. Her CBG last night was 357 (was close to 600) and fasting blood sugar today morning was 177. Pt states she is feeling great. Her confusion is almost gone and her energy level have improved. Pt stated this is the first time her treatment regimen was simplified for her and she able to take her diabetes medications.  Pt was reminded to FU x 1 week.

## 2022-10-01 ENCOUNTER — Other Ambulatory Visit: Payer: Self-pay | Admitting: Internal Medicine

## 2022-10-01 DIAGNOSIS — E1169 Type 2 diabetes mellitus with other specified complication: Secondary | ICD-10-CM

## 2022-10-08 ENCOUNTER — Ambulatory Visit (INDEPENDENT_AMBULATORY_CARE_PROVIDER_SITE_OTHER): Payer: Medicare Other | Admitting: Student

## 2022-10-08 ENCOUNTER — Encounter: Payer: Self-pay | Admitting: Student

## 2022-10-08 ENCOUNTER — Ambulatory Visit (INDEPENDENT_AMBULATORY_CARE_PROVIDER_SITE_OTHER): Payer: Medicare Other

## 2022-10-08 VITALS — BP 124/88 | HR 79 | Ht 65.0 in | Wt 124.0 lb

## 2022-10-08 DIAGNOSIS — E119 Type 2 diabetes mellitus without complications: Secondary | ICD-10-CM

## 2022-10-08 DIAGNOSIS — B370 Candidal stomatitis: Secondary | ICD-10-CM

## 2022-10-08 DIAGNOSIS — Z0000 Encounter for general adult medical examination without abnormal findings: Secondary | ICD-10-CM

## 2022-10-08 DIAGNOSIS — Z780 Asymptomatic menopausal state: Secondary | ICD-10-CM | POA: Diagnosis not present

## 2022-10-08 DIAGNOSIS — Z794 Long term (current) use of insulin: Secondary | ICD-10-CM

## 2022-10-08 DIAGNOSIS — H9193 Unspecified hearing loss, bilateral: Secondary | ICD-10-CM

## 2022-10-08 DIAGNOSIS — Z1231 Encounter for screening mammogram for malignant neoplasm of breast: Secondary | ICD-10-CM | POA: Insufficient documentation

## 2022-10-08 NOTE — Progress Notes (Signed)
Subjective:   Angie Cain is a 66 y.o. female who presents for an Initial Medicare Annual Wellness Visit. I connected with  [name] on 10/08/22 by a  Face-To-Face  enabled telemedicine application and verified that I am speaking with the correct person using two identifiers.  Patient Location: Other:  Office/Clinic  Provider Location: Office/Clinic  I discussed the limitations of evaluation and management by telemedicine. The patient expressed understanding and agreed to proceed.  Review of Systems    Defer to PCP       Objective:    Today's Vitals   10/08/22 1607 10/08/22 1608  BP: 124/88   Pulse: 79   SpO2: 100%   Weight: 124 lb (56.2 kg)   Height: '5\' 5"'  (1.651 m)   PainSc:  8    Body mass index is 20.63 kg/m.     10/08/2022    4:09 PM 10/08/2022    3:32 PM 09/29/2022   11:25 AM 09/09/2022   10:36 AM 08/05/2022   10:59 AM 07/29/2022   10:05 AM 05/15/2022   11:36 AM  Advanced Directives  Does Patient Have a Medical Advance Directive? No No No No No No No  Would patient like information on creating a medical advance directive? No - Patient declined No - Patient declined No - Patient declined No - Patient declined Yes (MAU/Ambulatory/Procedural Areas - Information given) No - Patient declined No - Guardian declined    Current Medications (verified) Outpatient Encounter Medications as of 10/08/2022  Medication Sig   Accu-Chek FastClix Lancets MISC CHECK BLOOD SUGAR 3 TIMES A DAY   Alcohol Swabs (B-D SINGLE USE SWABS REGULAR) PADS 1 each by Does not apply route 6 (six) times daily.   ALEVE 220 MG tablet Take 220-440 mg by mouth 2 (two) times daily as needed (for pain).   amlodipine-olmesartan (AZOR) 10-20 MG tablet Take 1 tablet by mouth daily.   aspirin EC 81 MG tablet Take 81 mg by mouth daily.   atorvastatin (LIPITOR) 40 MG tablet Take 1 tablet (40 mg total) by mouth at bedtime.   Blood Glucose Calibration (ACCU-CHEK AVIVA) SOLN Use to check controls on  diabetic glucose meter monthly   Blood Glucose Monitoring Suppl (ACCU-CHEK AVIVA PLUS) w/Device KIT Check blood sugar up to 3 times a day   calcium-vitamin D (OSCAL WITH D) 500-200 MG-UNIT tablet Take 1 tablet by mouth 2 (two) times daily.   cetirizine (ZYRTEC ALLERGY) 10 MG tablet Take 1 tablet (10 mg total) by mouth daily.   Dapagliflozin-metFORMIN HCl ER (XIGDUO XR) 04-999 MG TB24 Take 1 tablet by mouth daily.   diclofenac Sodium (VOLTAREN) 1 % GEL Apply 2 g topically 4 (four) times daily. (Patient taking differently: Apply 2 g topically 4 (four) times daily as needed (for pain).)   Dulaglutide (TRULICITY) 3 BT/5.9RC SOPN Inject 3 mg as directed once a week.   famotidine (PEPCID) 20 MG tablet Take 1 tablet (20 mg total) by mouth at bedtime.   fluconazole (DIFLUCAN) 150 MG tablet Take 1 tablet (150 mg total) by mouth daily. Take 2 doses 72 hours apart   glucose blood (ACCU-CHEK GUIDE) test strip Check blood sugar 3 times per day   insulin degludec (TRESIBA FLEXTOUCH) 100 UNIT/ML FlexTouch Pen Inject 22 Units into the skin daily.   Insulin Pen Needle 32G X 4 MM MISC Use to inject insulin once daily at bedtime   Insulin Pen Needle 32G X 4 MM MISC Use to inject insulin once daily at bedtime  latanoprost (XALATAN) 0.005 % ophthalmic solution PLACE 1 DROP INTO BOTH EYES AT BEDTIME.   mupirocin ointment (BACTROBAN) 2 % Apply 1 application topically daily.   nystatin (MYCOSTATIN) 100000 UNIT/ML suspension Take 5 mLs (500,000 Units total) by mouth 4 (four) times daily for 10 days   No facility-administered encounter medications on file as of 10/08/2022.    Allergies (verified) Bupropion, Jardiance [empagliflozin], Metronidazole, Shellfish allergy, and Shrimp extract allergy skin test   History: Past Medical History:  Diagnosis Date   Allergy    Anxiety    occasional   Arthritis    Atherosclerotic peripheral vascular disease with intermittent claudication ([place])    s/p fem-fem bypass 2011.  ABI 07/2012 0.5 R 0.65 L   Diabetes mellitus without complication (HCC)    GERD (gastroesophageal reflux disease)    Hyperlipidemia    Hypertension    IDDM (insulin dependent diabetes mellitus) 07/21/2007   July 2011: Femoral-femoral bypass  ABI 08/11/2012 notable for 0.4-0.59 on right ankle suggestive of severe arterial occlusive disease at rest and 0.60-0.7 on left ankle suggestive of moderate arterial occlusive disease at rest.    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   Family History  Problem Relation Age of Onset   Diabetes Mother    Diabetes Father    Diabetes Sister    Hypertension Sister    Diabetes Brother    Hypertension Brother    Diabetes Son    Diabetes Brother    Diabetes Brother    Diabetes Brother    Diabetes Brother    Diabetes Brother    Colon cancer Neg Hx    Esophageal cancer Neg Hx    Stomach cancer Neg Hx    Rectal cancer Neg Hx    Breast cancer Neg Hx    Social History   Socioeconomic History   Marital status: Married    Spouse name: Not on file   Number of children: Not on file   Years of education: Not on file   Highest education level: Not on file  Occupational History   Not on file  Tobacco Use   Smoking status: Every Day    Packs/day: 0.25    Types: Cigarettes   Smokeless tobacco: Never   Tobacco comments:    Smokes 1 cig/day.  Vaping Use   Vaping Use: Never used  Substance and Sexual Activity   Alcohol use: Yes    Alcohol/week: 1.0 - 2.0 standard drink of alcohol    Types: 1 - 2 Glasses of wine per week    Comment: weekly   Drug use: No   Sexual activity: Yes    Partners: Male   Other Topics Concern   Not on file  Social History Narrative   Current Social History 11/22/2020        Patient lives with spouse in an apartment which is 1 story. There are no steps up to the entrance.       Patient's method of transportation is personal car.      The highest level of education was high school diploma.  The patient currently disabled.      Identified important Relationships are Husband and sons       Pets : None       Interests / Fun: Arts and crafts, music, reading       Current Stressors: depression       Religious / Personal Beliefs: Christian, non-denominational    Social Determinants of Health   Financial Resource Strain: High Risk (10/08/2022)   Overall Financial Resource Strain (CARDIA)    Difficulty of Paying Living Expenses: Hard  Food Insecurity: Food Insecurity Present (10/08/2022)   Hunger Vital Sign    Worried About Running Out of Food in the Last Year: Sometimes true    Ran Out of Food in the Last Year: Sometimes true  Transportation Needs: No Transportation Needs (10/08/2022)   PRAPARE - Hydrologist (Medical): No    Lack of Transportation (Non-Medical): No  Physical Activity: Inactive (10/08/2022)   Exercise Vital Sign    Days of Exercise per Week: 0 days    Minutes of Exercise per Session: 0 min  Stress: Stress Concern Present (10/08/2022)   [place]    Feeling of Stress : Very much  Social Connections: Unknown (10/08/2022)   Social Connection and Isolation Panel [NHANES]    Frequency of Communication with Friends and Family: Never    Frequency of Social Gatherings with Friends and Family: Patient refused    Attends Religious Services: Never    Marine scientist or Organizations: Yes    Attends Archivist Meetings: Never    Marital Status: Married    Tobacco Counseling Ready to quit: Not Answered Counseling  given: Not Answered Tobacco comments: Smokes 1 cig/day.   Clinical Intake:  Pre-visit preparation completed: Yes  Pain : 0-10 Pain Score: 8  Pain Type: Acute pain Pain Location: Back Pain Orientation: Lower Pain Descriptors / Indicators: Aching Pain Onset: More than a month ago Pain Frequency: Constant     Nutritional Risks: None Diabetes: No  How often do you need to have someone help you when you read instructions, pamphlets, or other written materials from your doctor or pharmacy?: 1 - Never What is the last grade level you completed in school?: 12th grade  Diabetic?Yes  Interpreter Needed?: No  Information entered by :: Corey Skains Parnell Spieler,cma 10/08/22 4:08pm   Activities of Daily Living    10/08/2022    4:09 PM 10/08/2022    3:32 PM  In your present state of health, do you have any difficulty performing the following activities:  Hearing? 0 0  Vision? 1 1  Difficulty concentrating or making decisions? 0 0  Walking or climbing stairs? 0 0  Dressing or bathing? 0 0  Doing errands, shopping? 0 0    Patient Care Team: Lacinda Axon, MD as PCP - General (Student) Clent Jacks, MD as Consulting Physician (Ophthalmology)  Indicate any recent Medical Services you may have received from other than [facility] providers in the past year (date may be approximate).     Assessment:   This is a routine wellness examination for [place].  Hearing/Vision screen No results found.  Dietary issues and exercise activities discussed:     Goals Addressed   None   Depression Screen    10/08/2022    4:09 PM 10/08/2022    3:31 PM 09/29/2022    9:58 AM 09/09/2022   11:41 AM 08/05/2022   11:00 AM 07/29/2022  10:03 AM 05/15/2022   11:28 AM  PHQ 2/9 Scores  PHQ - 2 Score 0 0 '6 6 1 1 2  ' PHQ- 9 Score   '24 24 4 4 11    ' Fall Risk    10/08/2022    4:09 PM 10/08/2022    3:31 PM 09/29/2022    9:01 AM 09/09/2022   10:27 AM 08/05/2022   10:59 AM  Fall Risk   Falls in the past  year? 0 0 0 0 0  Number falls in past yr: 0 0 0 0   Injury with Fall? 0 0 0 0   Risk for fall due to : No Fall Risks No Fall Risks  No Fall Risks No Fall Risks  Follow up Falls evaluation completed;Falls prevention discussed Falls evaluation completed;Falls prevention discussed Falls evaluation completed Falls evaluation completed;Falls prevention discussed Falls evaluation completed    FALL RISK PREVENTION PERTAINING TO THE HOME:  Any stairs in or around the home? No  If so, are there any without handrails? No  Home free of loose throw rugs in walkways, pet beds, electrical cords, etc? Yes  Adequate lighting in your home to reduce risk of falls? Yes   ASSISTIVE DEVICES UTILIZED TO PREVENT FALLS:  Life alert? No  Use of a cane, walker or w/c? No  Grab bars in the bathroom? No  Shower chair or bench in shower? No  Elevated toilet seat or a handicapped toilet? No   TIMED UP AND GO:  Was the test performed? Yes .  Length of time to ambulate 10 feet: 1 min.   Gait slow and steady without use of assistive device  Cognitive Function:        10/08/2022    4:09 PM 05/15/2022   11:38 AM  6CIT Screen  What Year? 0 points 0 points  What month? 0 points 0 points  What time? 0 points 0 points  Count back from 20 0 points 0 points  Months in reverse 0 points 0 points  Repeat phrase 0 points 0 points  Total Score 0 points 0 points    Immunizations Immunization History  Administered Date(s) Administered   Fluad Quad(high Dose 65+) 09/09/2022   Influenza Whole 10/22/2006   Influenza,inj,Quad PF,6+ Mos 09/18/2013, 10/26/2014, 10/09/2016, 01/06/2019, 09/12/2019, 09/17/2020   PFIZER(Purple Top)SARS-COV-2 Vaccination 01/22/2020, 02/16/2020, 09/24/2020, 04/02/2021   Pneumococcal Conjugate-13 12/23/2020   Pneumococcal Polysaccharide-23 09/21/2005   Td 12/17/2003   Tdap 06/14/2015    TDAP status: Up to date  Flu Vaccine status: Up to date  Pneumococcal vaccine status: Due,  Education has been provided regarding the importance of this vaccine. Advised may receive this vaccine at local pharmacy or Health Dept. Aware to provide a copy of the vaccination record if obtained from local pharmacy or Health Dept. Verbalized acceptance and understanding.  Covid-19 vaccine status: Completed vaccines  Qualifies for Shingles Vaccine? No   Zostavax completed No   Shingrix Completed?: No.    Education has been provided regarding the importance of this vaccine. Patient has been advised to call insurance company to determine out of pocket expense if they have not yet received this vaccine. Advised may also receive vaccine at local pharmacy or Health Dept. Verbalized acceptance and understanding.  Screening Tests Health Maintenance  Topic Date Due   Zoster Vaccines- Shingrix (1 of 2) Never done   Diabetic kidney evaluation - Urine ACR  05/23/2016   DEXA SCAN  04/29/2018   Pneumonia Vaccine 87+ Years old (75 - PPSV23  or PCV20) 12/23/2021   COLONOSCOPY (Pts 45-25yr Insurance coverage will need to be confirmed)  10/09/2023 (Originally 06/14/2022)   HEMOGLOBIN A1C  10/29/2022   FOOT EXAM  01/21/2023   Diabetic kidney evaluation - GFR measurement  09/10/2023   OPHTHALMOLOGY EXAM  09/17/2023   MAMMOGRAM  09/26/2023   Medicare Annual Wellness (AWV)  10/09/2023   TETANUS/TDAP  06/13/2025   INFLUENZA VACCINE  Completed   Hepatitis C Screening  Completed   HPV VACCINES  Aged Out   COVID-19 Vaccine  Discontinued    Health Maintenance  Health Maintenance Due  Topic Date Due   Zoster Vaccines- Shingrix (1 of 2) Never done   Diabetic kidney evaluation - Urine ACR  05/23/2016   DEXA SCAN  04/29/2018   Pneumonia Vaccine 68 Years old (3 - PPSV23 or PCV20) 12/23/2021    Colorectal Cancer Screening:Postpones until 10/09/2023  Mammogram status: Ordered 10/08/2022. Pt provided with contact info and advised to call to schedule appt.     Lung Cancer Screening: (Low Dose CT Chest  recommended if Age 68-80years, 30 pack-year currently smoking OR have quit w/in 15years.) does not qualify.   Lung Cancer Screening Referral: N/A  Additional Screening:  Hepatitis C Screening: does not qualify; Completed 10/09/2016  Vision Screening: Recommended annual ophthalmology exams for early detection of glaucoma and other disorders of the eye. N/A patient refused to answer question Is the patient up to date with their annual eye exam?  N/A patient refused to answer question Who is the provider or what is the name of the office in which the patient attends annual eye exams? N/A patient refused to answer question If pt is not established with a provider, would they like to be referred to a provider to establish care? N/A patient refused to answer question  Dental Screening: Recommended annual dental exams for proper oral hygiene  Community Resource Referral / Chronic Care Management: CRR required this visit?  Yes   CCM required this visit?  No      Plan:     I have personally reviewed and noted the following in the patient's chart:   Medical and social history Use of alcohol, tobacco or illicit drugs  Current medications and supplements including opioid prescriptions. Patient is not currently taking opioid prescriptions. Functional ability and status Nutritional status Physical activity Advanced directives List of other physicians Hospitalizations, surgeries, and ER visits in previous 12 months Vitals Screenings to include cognitive, depression, and falls Referrals and appointments  In addition, I have reviewed and discussed with patient certain preventive protocols, quality metrics, and best practice recommendations. A written personalized care plan for preventive services as well as general preventive health recommendations were provided to patient.     JKerin Perna C[facility]  10/08/2022   Nurse Notes:Face-To-Face visit

## 2022-10-08 NOTE — Assessment & Plan Note (Signed)
Mammogram referral ordered

## 2022-10-08 NOTE — Assessment & Plan Note (Signed)
Patient notes that recently she has been having trouble hearing, and states that gradually her hearing has gotten worse once a referral to audiology.  Plan: -Audiology referral placed

## 2022-10-08 NOTE — Assessment & Plan Note (Addendum)
Regarding patient's diabetes, patient reports that she is much more comfortable with her medication regimen with [place] 22 units daily and Xigdou daily along with Trulicity every Tuesday.  Patient reports that her fasting blood sugars this week have been in the low 110s.  She also reports that she sometimes has concerns with her eating habits at night, which she states she will work on.  Patient's highest blood glucose this week has been 150, but mostly have been in the low 110s.  On exam, there are no concerns.  Plan would be to continue with same treatment, and have patient follow-up in 1 month for A1c check.  Plan: -Continue with Xigdou 04-999 mg daily -Continue Tresiba 22 units daily -Continue Trulicity 3 mg weekly -Urine microalbumin/creatinine ratio pending -A1c check in 1 month  Addendum: -Microalbumin creatinine ratio increasing slightly, but within normal limits.  We will continue to monitor.  Plan will be for strict glucose control.

## 2022-10-08 NOTE — Assessment & Plan Note (Signed)
Patient request DEXA scan today, will refer.  Patient defers colonoscopy.

## 2022-10-08 NOTE — Patient Instructions (Signed)
Haasini, Patnaude you for allowing me to take part in your care today.  Here are your instructions.  1.  Regarding your diabetes, I want you to keep doing your medications as instructed.  Please take your Merleen Nicely and Tresiba 20 units every morning.  Please continue taking your Trulicity every Tuesday.  Keep measuring your blood sugars daily.  2.  Regarding your hearing loss, I have referred you to a cardiologist, please wait for follow-up  3.  Regarding bone density scan, I have referred you for a bone density scan, please await for phone call.  4.  Please follow-up in 1 month for A1c check   Thank you, Dr. Posey Pronto  If you have any other questions please contact the internal medicine clinic at [phone]

## 2022-10-08 NOTE — Progress Notes (Addendum)
CC: Diabetes follow-up  HPI:  [name] is a 66 y.o. female with a past medical history of type 2 diabetes followed up for diabetes follow-up.  Please see assessment and plan for full HPI.  Past Medical History:  Diagnosis Date   Allergy    Anxiety    occasional   Arthritis    Atherosclerotic peripheral vascular disease with intermittent claudication ([place])    s/p fem-fem bypass 2011. ABI 07/2012 0.5 R 0.65 L   Diabetes mellitus without complication (HCC)    GERD (gastroesophageal reflux disease)    Hyperlipidemia    Hypertension    IDDM (insulin dependent diabetes mellitus) 07/21/2007   July 2011: Femoral-femoral bypass  ABI 08/11/2012 notable for 0.4-0.59 on right ankle suggestive of severe arterial occlusive disease at rest and 0.60-0.7 on left ankle suggestive of moderate arterial occlusive disease at rest.    Iron deficiency anemia 05/08/2017   Low back pain 12/18/2013   Personal history of colonic polyps - adenomas 03/07/2014   Rotator cuff impingement syndrome of left shoulder 03/23/2016   Tobacco abuse      Current Outpatient Medications:    Accu-Chek FastClix Lancets MISC, CHECK BLOOD SUGAR 3 TIMES A DAY, Disp: 306 each, Rfl: 1   Alcohol Swabs (B-D SINGLE USE SWABS REGULAR) PADS, 1 each by Does not apply route 6 (six) times daily., Disp: 600 each, Rfl: 1   ALEVE 220 MG tablet, Take 220-440 mg by mouth 2 (two) times daily as needed (for pain)., Disp: , Rfl:    amlodipine-olmesartan (AZOR) 10-20 MG tablet, Take 1 tablet by mouth daily., Disp: 30 tablet, Rfl: 2   aspirin EC 81 MG tablet, Take 81 mg by mouth daily., Disp: , Rfl:    atorvastatin (LIPITOR) 40 MG tablet, Take 1 tablet (40 mg total) by mouth at bedtime., Disp: 90 tablet, Rfl: 3   Blood Glucose Calibration (ACCU-CHEK AVIVA) SOLN, Use to check controls on diabetic glucose meter monthly, Disp: 1 each, Rfl: 0   Blood Glucose Monitoring Suppl (ACCU-CHEK AVIVA PLUS) w/Device KIT, Check blood sugar up to 3 times a  day, Disp: 1 kit, Rfl: 1   calcium-vitamin D (OSCAL WITH D) 500-200 MG-UNIT tablet, Take 1 tablet by mouth 2 (two) times daily., Disp: 60 tablet, Rfl: 3   cetirizine (ZYRTEC ALLERGY) 10 MG tablet, Take 1 tablet (10 mg total) by mouth daily., Disp: 7 tablet, Rfl: 0   Dapagliflozin Pro-metFORMIN ER (XIGDUO XR) 04-999 MG TB24, Take 1 tablet by mouth daily., Disp: 30 tablet, Rfl: 2   diclofenac Sodium (VOLTAREN) 1 % GEL, Apply 2 g topically 4 (four) times daily. (Patient taking differently: Apply 2 g topically 4 (four) times daily as needed (for pain).), Disp: 100 g, Rfl: 2   Dulaglutide (TRULICITY) 3 BW/3.8LH SOPN, Inject 3 mg as directed once a week., Disp: 4 mL, Rfl: 2   famotidine (PEPCID) 20 MG tablet, Take 1 tablet (20 mg total) by mouth at bedtime., Disp: 7 tablet, Rfl: 0   fluconazole (DIFLUCAN) 150 MG tablet, Take 1 tablet (150 mg total) by mouth daily. Take 2 doses 72 hours apart, Disp: 2 tablet, Rfl: 0   glucose blood (ACCU-CHEK GUIDE) test strip, Check blood sugar 3 times per day, Disp: 300 each, Rfl: 1   insulin degludec (TRESIBA FLEXTOUCH) 100 UNIT/ML FlexTouch Pen, Inject 22 Units into the skin daily., Disp: 15 mL, Rfl: 2   Insulin Pen Needle 32G X 4 MM MISC, Use to inject insulin once daily at bedtime, Disp:  100 each, Rfl: 1   Insulin Pen Needle 32G X 4 MM MISC, Use to inject insulin once daily at bedtime, Disp: 100 each, Rfl: 4   latanoprost (XALATAN) 0.005 % ophthalmic solution, PLACE 1 DROP INTO BOTH EYES AT BEDTIME., Disp: 7.5 mL, Rfl: 0   mupirocin ointment (BACTROBAN) 2 %, Apply 1 application topically daily., Disp: 22 g, Rfl: 0   nystatin (MYCOSTATIN) 100000 UNIT/ML suspension, Take 5 mLs (500,000 Units total) by mouth 4 (four) times daily for 10 days, Disp: 200 mL, Rfl: 0  Review of Systems:   Negative except for what is stated in HPI  Physical Exam:  Vitals:   10/08/22 1525  BP: 124/88  Pulse: 79  SpO2: 100%  Weight: 124 lb (56.2 kg)  Height: '5\' 5"'  (1.651 m)    General: Patient is sitting comfortably in the room  Head: Normocephalic, atraumatic  Cardio: Regular rate and rhythm, no murmurs, rubs or gallops. 2+ pulses to bilateral upper and lower extremities  Chest: No chest tenderness Pulmonary: Clear to ausculation bilaterally with no rales, rhonchi, and crackles    Assessment & Plan:   Type 2 diabetes mellitus ([place]) Regarding patient's diabetes, patient reports that she is much more comfortable with her medication regimen with [place] 22 units daily and Xigdou daily along with Trulicity every Tuesday.  Patient reports that her fasting blood sugars this week have been in the low 110s.  She also reports that she sometimes has concerns with her eating habits at night, which she states she will work on.  Patient's highest blood glucose this week has been 150, but mostly have been in the low 110s.  On exam, there are no concerns.  Plan would be to continue with same treatment, and have patient follow-up in 1 month for A1c check.  Plan: -Continue with Xigdou 04-999 mg daily -Continue Tresiba 22 units daily -Continue Trulicity 3 mg weekly -Urine microalbumin/creatinine ratio pending -A1c check in 1 month  Addendum: -Microalbumin creatinine ratio increasing slightly, but within normal limits.  We will continue to monitor.  Plan will be for strict glucose control.  Oral thrush Patient reports complete resolution of oral thrush with nystatin wash.  Patient has no concerns about this at this time.  Plan: -Use nystatin wash as needed for oral thrush  Healthcare maintenance Patient request DEXA scan today, will refer.  Patient defers colonoscopy.    Breast cancer screening by mammogram Mammogram referral ordered  Bilateral hearing loss Patient notes that recently she has been having trouble hearing, and states that gradually her hearing has gotten worse once a referral to audiology.  Plan: -Audiology referral placed   Patient seen with Dr.  Wray Kearns, DO PGY-1 Internal Medicine Resident  Pager: [phone]

## 2022-10-08 NOTE — Assessment & Plan Note (Signed)
Patient reports complete resolution of oral thrush with nystatin wash.  Patient has no concerns about this at this time.  Plan: -Use nystatin wash as needed for oral thrush

## 2022-10-09 LAB — MICROALBUMIN / CREATININE URINE RATIO
Creatinine, Urine: 54.2 mg/dL
Microalb/Creat Ratio: 26 mg/g creat (ref 0–29)
Microalbumin, Urine: 14.2 ug/mL

## 2022-10-09 NOTE — Progress Notes (Signed)
Internal Medicine Clinic Attending  I saw and evaluated the patient.  I personally confirmed the key portions of the history and exam documented by Dr. Patel and I reviewed pertinent patient test results.  The assessment, diagnosis, and plan were formulated together and I agree with the documentation in the resident's note.  

## 2022-10-12 ENCOUNTER — Other Ambulatory Visit (HOSPITAL_COMMUNITY): Payer: Self-pay

## 2022-10-13 ENCOUNTER — Ambulatory Visit: Payer: Medicare Other | Attending: Audiologist | Admitting: Audiologist

## 2022-10-13 DIAGNOSIS — H903 Sensorineural hearing loss, bilateral: Secondary | ICD-10-CM | POA: Diagnosis not present

## 2022-10-13 NOTE — Procedures (Signed)
  Outpatient Audiology and [place], [place]  [address] [phone]  AUDIOLOGICAL  EVALUATION  NAME: Angie Cain     DOB:   08/10/1956      MRN: [id number]                                                                                     DATE: 10/13/2022     REFERENT: Lacinda Axon, MD STATUS: Outpatient DIAGNOSIS: Sensorineural Hearing Loss Bilateral     History: Angie Cain was seen for an audiological evaluation. Angie Cain is receiving a hearing evaluation due to concerns for difficulty hearing and a static sound in her ears. Angie Cain has difficulty hearing people, she feel she is straining to hear. Her husband sounds like he is mumbling. This difficulty began gradually. No pain or pressure reported in either ear. Tinnitus present sounding like waves of static in both ears. Angie Cain has a history of diabetes with neuropathy. No history of noise exposure. No other relevant case history reported.   Evaluation:  Otoscopy showed a clear view of the tympanic membranes, bilaterally Tympanometry results were consistent with normal middle ear function, bilaterally   Audiometric testing was completed using conventional audiometry with insert transducer. Speech Recognition Thresholds were 40dB in the right ear and 35 dB in the left ear. Word Recognition was performed 40dB SL, scored 96% in the right ear and 92% in the left ear. Pure tone thresholds show slight sloping to moderately severe sensorineural hearing loss in each ear.   Results:  The test results were reviewed with [facility]. She has mild sloping to moderately severe bilateral sensorineural hearing loss. She needs hearing aids for both ears. She has a Tax inspector, she was encouraged to call her insurance to check her benefit. She was informed she needs annual hearing tests due to the diabetes. All questions answered. List of local hearing aid providers given, however her insurance will  tell her who in the area accepts the [facility] benefit for hearing.   Recommendations: Annual audiology testing is needed to monitor progressive loss.  Amplification is necessary for both ears. Hearing aids can be purchased from a variety of locations. See provided list for locations in the [place] area.    43 minutes spent testing and counseling on results.   Alfonse Alpers  Audiologist, Au.D., CCC-A 10/13/2022  11:34 AM  Cc: Lacinda Axon, MD

## 2022-10-20 ENCOUNTER — Other Ambulatory Visit: Payer: Self-pay | Admitting: Student

## 2022-10-20 DIAGNOSIS — I10 Essential (primary) hypertension: Secondary | ICD-10-CM

## 2022-10-21 DIAGNOSIS — L680 Hirsutism: Secondary | ICD-10-CM | POA: Diagnosis not present

## 2022-10-28 DIAGNOSIS — Z780 Asymptomatic menopausal state: Secondary | ICD-10-CM | POA: Insufficient documentation

## 2022-10-28 NOTE — Addendum Note (Signed)
Addended by: Leigh Aurora on: 10/28/2022 04:39 PM   Modules accepted: Level of Service

## 2022-10-29 ENCOUNTER — Other Ambulatory Visit: Payer: Self-pay | Admitting: Internal Medicine

## 2022-10-29 ENCOUNTER — Other Ambulatory Visit: Payer: Self-pay | Admitting: *Deleted

## 2022-10-29 DIAGNOSIS — F330 Major depressive disorder, recurrent, mild: Secondary | ICD-10-CM

## 2022-10-29 NOTE — Progress Notes (Signed)
Received message pt's social work referral is not in the WQ. Placed another referral given persistent concern pt having difficulty with medication administration to assess if pt can get home health service.   Idamae Schuller, MD Tillie Rung. [facility] Internal Medicine Residency, PGY-2

## 2022-11-02 ENCOUNTER — Telehealth: Payer: Self-pay | Admitting: *Deleted

## 2022-11-02 NOTE — Telephone Encounter (Signed)
Contacted patient regarding PREP referral. Left voice message for return call.

## 2022-11-03 ENCOUNTER — Telehealth: Payer: Self-pay | Admitting: *Deleted

## 2022-11-03 NOTE — Progress Notes (Signed)
  Care Coordination   Note   11/03/2022 Name: Angie Cain MRN: [id number] DOB: March 01, 1956  Angie Cain is a 66 y.o. year old female who sees Amponsah, Charisse March, MD for primary care. I reached out to [facility] by phone today to offer care coordination services.  Angie Cain was given information about Care Coordination services today including:   The Care Coordination services include support from the care team which includes your Nurse Coordinator, Clinical Social Worker, or Pharmacist.  The Care Coordination team is here to help remove barriers to the health concerns and goals most important to you. Care Coordination services are voluntary, and the patient may decline or stop services at any time by request to their care team member.   Care Coordination Consent Status: Patient agreed to services and verbal consent obtained.   Follow up plan:  Telephone appointment with care coordination team member scheduled for:  11/13/22  Encounter Outcome:  Pt. Scheduled  [place]  Direct Dial: [phone]

## 2022-11-09 ENCOUNTER — Encounter: Payer: Medicare Other | Admitting: Internal Medicine

## 2022-11-09 NOTE — Progress Notes (Deleted)
T2DM: Current regimen is Xigdou 04-999 mg daily, Tresiba 22 units daily, and Trulicity 3 mg weekly. HbA1x last checked in 07/2022 was elevated at 9.5%; HbA1c today is *. She checks her sugar at home and reports *. Plan:  NEUROPATHY  HLD:  HTN: BP at goal today, *. Current regimen is amlodipine-olmesartan 10-20 mg daily.   GERD  OA  OSTEOPENIA  *PNEUMONIA VACCINE SHINGLES VACCINE

## 2022-11-10 NOTE — Progress Notes (Signed)
Internal Medicine Clinic Attending  Case and documentation reviewed.  I reviewed the AWV findings.  I agree with the assessment, diagnosis, and plan of care documented in the AWV note.     

## 2022-11-11 ENCOUNTER — Ambulatory Visit (INDEPENDENT_AMBULATORY_CARE_PROVIDER_SITE_OTHER): Payer: Medicare Other | Admitting: Student

## 2022-11-11 ENCOUNTER — Other Ambulatory Visit (HOSPITAL_COMMUNITY): Payer: Self-pay

## 2022-11-11 ENCOUNTER — Encounter: Payer: Self-pay | Admitting: Internal Medicine

## 2022-11-11 VITALS — BP 145/115 | HR 75

## 2022-11-11 DIAGNOSIS — F17210 Nicotine dependence, cigarettes, uncomplicated: Secondary | ICD-10-CM | POA: Diagnosis not present

## 2022-11-11 DIAGNOSIS — E119 Type 2 diabetes mellitus without complications: Secondary | ICD-10-CM

## 2022-11-11 DIAGNOSIS — I739 Peripheral vascular disease, unspecified: Secondary | ICD-10-CM

## 2022-11-11 DIAGNOSIS — E1151 Type 2 diabetes mellitus with diabetic peripheral angiopathy without gangrene: Secondary | ICD-10-CM | POA: Diagnosis not present

## 2022-11-11 DIAGNOSIS — H4010X0 Unspecified open-angle glaucoma, stage unspecified: Secondary | ICD-10-CM | POA: Diagnosis not present

## 2022-11-11 DIAGNOSIS — I10 Essential (primary) hypertension: Secondary | ICD-10-CM

## 2022-11-11 DIAGNOSIS — Z794 Long term (current) use of insulin: Secondary | ICD-10-CM

## 2022-11-11 LAB — POCT GLYCOSYLATED HEMOGLOBIN (HGB A1C): Hemoglobin A1C: 10.4 % — AB (ref 4.0–5.6)

## 2022-11-11 LAB — GLUCOSE, CAPILLARY: Glucose-Capillary: 242 mg/dL — ABNORMAL HIGH (ref 70–99)

## 2022-11-11 NOTE — Patient Instructions (Signed)
Please restart your aspirin 5 days after your eye surgery,  Your blood pressure is elevated today. Please take you blood pressure medication prior to your next visit.   Follow up in 1 month

## 2022-11-12 DIAGNOSIS — H2511 Age-related nuclear cataract, right eye: Secondary | ICD-10-CM | POA: Diagnosis not present

## 2022-11-12 DIAGNOSIS — H401111 Primary open-angle glaucoma, right eye, mild stage: Secondary | ICD-10-CM | POA: Diagnosis not present

## 2022-11-12 MED ORDER — TRULICITY 1.5 MG/0.5ML ~~LOC~~ SOAJ: 1.5000 mg | SUBCUTANEOUS | 3 refills | Status: DC

## 2022-11-13 ENCOUNTER — Other Ambulatory Visit (HOSPITAL_COMMUNITY): Payer: Self-pay

## 2022-11-13 ENCOUNTER — Ambulatory Visit: Payer: Self-pay

## 2022-11-13 MED ORDER — TRULICITY 1.5 MG/0.5ML ~~LOC~~ SOAJ
1.5000 mg | SUBCUTANEOUS | 3 refills | Status: DC
  Filled 2022-11-13: qty 6, 84d supply, fill #0

## 2022-11-14 NOTE — Patient Instructions (Signed)
Visit Information  Thank you for taking time to visit with me today. Please don't hesitate to contact me if I can be of assistance to you.   Following are the goals we discussed today:   Goals Addressed   None     Our next appointment is by telephone on 12/5 at 130 pm  Please call the care guide team at [phone] if you need to cancel or reschedule your appointment.   If you are experiencing a Mental Health or [place] or need someone to talk to, please call 1-800-273-TALK (toll free, 24 hour hotline)  The patient verbalized understanding of instructions, educational materials, and care plan provided today.    Lazaro Arms RN, BSN, [facility]   Phone: [phone]

## 2022-11-14 NOTE — Patient Outreach (Signed)
  Care Coordination   Rescheeduled  Visit Note   11/13/2022 Name: Angie Cain MRN: [id number] DOB: 10/21/1956  Angie Cain is a 66 y.o. year old female who sees Amponsah, Charisse March, MD for primary care. I spoke with  Angie Cain by phone today.  What matters to the patients health and wellness today?  Angie Cain resides in a shared household with her spouse and another family. She is currently dealing with several matters in her life. During our conversation, she informed me that she had to attend an eye appointment to remove a patch, which prevented her from speaking further. We agreed to reschedule our discussion for next week. Despite this, we were able to talk about the program, but we could not delve into any other details.      SDOH assessments and interventions completed:  No     Care Coordination Interventions:  No, not indicated   Follow up plan: Follow up call scheduled for 12/5 130 pm    Encounter Outcome:  Pt. Visit Completed   Angie Arms RN, BSN, [facility]   Phone: [phone]

## 2022-11-16 ENCOUNTER — Other Ambulatory Visit: Payer: Self-pay | Admitting: Student

## 2022-11-16 DIAGNOSIS — I10 Essential (primary) hypertension: Secondary | ICD-10-CM

## 2022-11-17 ENCOUNTER — Telehealth: Payer: Self-pay

## 2022-11-17 NOTE — Patient Outreach (Signed)
  Care Coordination   11/17/2022 Name: Angie Cain MRN: [id number] DOB: Feb 05, 1956   Care Coordination Outreach Attempts:  An unsuccessful telephone outreach was attempted today to offer the patient information about available care coordination services as a benefit of their health plan.   Follow Up Plan:  Additional outreach attempts will be made to offer the patient care coordination information and services.   Encounter Outcome:  No Answer   Care Coordination Interventions:  No, not indicated    Lazaro Arms RN, BSN, [facility]   Phone: [phone]

## 2022-11-18 ENCOUNTER — Encounter: Payer: Self-pay | Admitting: Student

## 2022-11-18 DIAGNOSIS — H409 Unspecified glaucoma: Secondary | ICD-10-CM | POA: Insufficient documentation

## 2022-11-18 DIAGNOSIS — I739 Peripheral vascular disease, unspecified: Secondary | ICD-10-CM | POA: Insufficient documentation

## 2022-11-18 NOTE — Assessment & Plan Note (Signed)
History of femoral-femoral bypass graft in 2011. Visit with vascular in 03/2022. Bypass is patent and  ABIs were improved. No currently taking Asprin is on a statin.  She think her vascular doctor told her she may not need to take ASA. Still has some leg pain that may be more likely due to neuropathy.  Will have her follow up after her eye surgery to discuss her leg pain further. Discussed she could restart her ASA 5 days after surgery.

## 2022-11-18 NOTE — Assessment & Plan Note (Signed)
A1c increased from 9.5 to 10.4% . Is prescribed trulicity 3 mg weekly but looks like she is getting to 0.75 mg dose filled. Patient confirms she is using the 0.75 mg dose.  She did not bring her meter to her visit today.   -increase trulicity to 1.5 mg weekly - continue tresiba and xigdou  - follow up in 1 month

## 2022-11-18 NOTE — Progress Notes (Signed)
Internal Medicine Clinic Attending ? ?Case discussed with Dr. Liang  At the time of the visit.  We reviewed the resident?s history and exam and pertinent patient test results.  I agree with the assessment, diagnosis, and plan of care documented in the resident?s note. ? ?

## 2022-11-18 NOTE — Assessment & Plan Note (Signed)
Patient here for follow up regarding her ASA prior to [facility] on 12/12023. She is not currently taking aspirin. This is for PAD s/p fem-fem bypass. Discussed she may benefit from restarting this 5 days after her procedure. The will think about this.

## 2022-11-18 NOTE — Addendum Note (Signed)
Addended by: Charise Killian on: 11/18/2022 12:26 PM   Modules accepted: Level of Service

## 2022-11-18 NOTE — Assessment & Plan Note (Signed)
BP elevated today. States she was frustrated with parking prior to her appointment. She did not take her BP medication today. Previously controlled on this medication. Instructed her to take her medication prior to her next appointment. Will have her follow up in 2 weeks after her eye surgery.

## 2022-11-18 NOTE — Progress Notes (Signed)
Established Patient Office Visit  Subjective   Patient ID: Angie Cain, female    DOB: 1956/02/04  Age: 66 y.o. MRN: [id number]  Chief Complaint  Patient presents with   Medical Clearance    Having eye surgery tomorrow with Dr.Groat    Carolle Cain is a 66 y.o. person living with a history listed below who presents to clinic for pre surgical evaluation prior to [facility] goniotomy for open angle glaucoma. Please refer to problem based charting for further details and assessment and plan of current problem and chronic medical conditions.     Patient Active Problem List   Diagnosis Date Noted   Glaucoma 11/18/2022   PAD (peripheral artery disease) ([place]) 11/18/2022   Postmenopausal 10/28/2022   Breast cancer screening by mammogram 10/08/2022   Bilateral hearing loss 10/08/2022   Oral thrush 09/29/2022   Medication nonadherence due to psychosocial problem 09/09/2022   Chronic bilateral inguinal dysesthesias, recurrent  03/18/2022   Hirsutism 02/12/2022   Contact dermatitis 02/12/2022   Diabetic neuropathy ([place]) 01/22/2022   Yeast infection 09/09/2021   Flank pain 07/17/2021   Tinnitus 08/15/2019   Osteoarthritis of right hip 08/15/2019   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   Allergic rhinitis 10/16/2017   Major depression 10/16/2017   Healthcare maintenance 03/18/2017   Osteopenia determined by x-ray 01/25/2015   Bilateral plantar fasciitis 09/30/2014   Hx of adenomatous colonic polyps 03/07/2014   Low back pain 12/18/2013   GERD (gastroesophageal reflux disease) 06/06/2013   Hyperlipidemia associated with type 2 diabetes mellitus ([place]) 06/06/2013   Tobacco use disorder 07/21/2007   Essential hypertension 07/21/2007   Type 2 diabetes mellitus ([place]) 07/20/1993      ROS: negative as per HPI     Objective:     BP (!) 145/115 (BP Location: Left Arm, Patient Position: Sitting, Cuff Size: Normal)   Pulse 75   SpO2 95%  BP Readings from Last 3 Encounters:   11/11/22 (!) 145/115  10/08/22 124/88  10/08/22 124/88      Physical Exam Constitutional:      Appearance: Normal appearance.  HENT:     Head: Normocephalic and atraumatic.     Mouth/Throat:     Mouth: Mucous membranes are moist.     Pharynx: Oropharynx is clear.  Cardiovascular:     Rate and Rhythm: Normal rate and regular rhythm.     Pulses: Normal pulses.  Pulmonary:     Effort: Pulmonary effort is normal.     Breath sounds: No rhonchi or rales.  Abdominal:     General: Abdomen is flat. Bowel sounds are normal. There is no distension.     Palpations: Abdomen is soft.     Tenderness: There is no abdominal tenderness.  Musculoskeletal:        General: Normal range of motion.     Right lower leg: No edema.     Left lower leg: No edema.  Skin:    General: Skin is warm and dry.     Capillary Refill: Capillary refill takes less than 2 seconds.  Neurological:     General: No focal deficit present.     Mental Status: She is alert and oriented to person, place, and time.  Psychiatric:        Mood and Affect: Mood normal.        Behavior: Behavior normal.      Results for orders placed or performed in visit on 11/11/22  Glucose, capillary  Result Value Ref Range  Glucose-Capillary 242 (H) 70 - 99 mg/dL  POC Hbg A1C  Result Value Ref Range   Hemoglobin A1C 10.4 (A) 4.0 - 5.6 %   HbA1c POC (<> result, manual entry)     HbA1c, POC (prediabetic range)     HbA1c, POC (controlled diabetic range)      Last hemoglobin A1c Lab Results  Component Value Date   HGBA1C 10.4 (A) 11/11/2022     The ASCVD Risk score (Arnett DK, et al., 2019) failed to calculate for the following reasons:   The valid total cholesterol range is 130 to 320 mg/dL    Assessment & Plan:   Problem List Items Addressed This Visit       Cardiovascular and Mediastinum   Essential hypertension (Chronic)    BP elevated today. States she was frustrated with parking prior to her appointment. She  did not take her BP medication today. Previously controlled on this medication. Instructed her to take her medication prior to her next appointment. Will have her follow up in 2 weeks after her eye surgery.       PAD (peripheral artery disease) ([place])    History of femoral-femoral bypass graft in 2011. Visit with vascular in 03/2022. Bypass is patent and  ABIs were improved. No currently taking Asprin is on a statin.  She think her vascular doctor told her she may not need to take ASA. Still has some leg pain that may be more likely due to neuropathy.  Will have her follow up after her eye surgery to discuss her leg pain further. Discussed she could restart her ASA 5 days after surgery.         Endocrine   Type 2 diabetes mellitus ([place]) - Primary   Relevant Medications   Dulaglutide (TRULICITY) 1.5 TC/4.8LY SOPN   Other Relevant Orders   POC Hbg A1C (Completed)     Other   Glaucoma    Patient here for follow up regarding her ASA prior to [facility] on 12/12023. She is not currently taking aspirin. This is for PAD s/p fem-fem bypass. Discussed she may benefit from restarting this 5 days after her procedure. The will think about this.        No follow-ups on file.    Iona Beard, MD

## 2022-11-20 ENCOUNTER — Other Ambulatory Visit (HOSPITAL_COMMUNITY): Payer: Self-pay

## 2022-11-20 NOTE — Telephone Encounter (Signed)
Rescheduled 12/01/22  [place]  Direct Dial: [phone]

## 2022-11-22 DIAGNOSIS — H2512 Age-related nuclear cataract, left eye: Secondary | ICD-10-CM | POA: Diagnosis not present

## 2022-11-23 ENCOUNTER — Other Ambulatory Visit: Payer: Self-pay | Admitting: Student

## 2022-11-23 DIAGNOSIS — I10 Essential (primary) hypertension: Secondary | ICD-10-CM

## 2022-11-26 DIAGNOSIS — H2512 Age-related nuclear cataract, left eye: Secondary | ICD-10-CM | POA: Diagnosis not present

## 2022-11-26 DIAGNOSIS — H401121 Primary open-angle glaucoma, left eye, mild stage: Secondary | ICD-10-CM | POA: Diagnosis not present

## 2022-11-30 ENCOUNTER — Other Ambulatory Visit (HOSPITAL_COMMUNITY): Payer: Self-pay

## 2022-11-30 MED ORDER — TIMOLOL MALEATE 0.5 % OP SOLN
1.0000 [drp] | Freq: Every morning | OPHTHALMIC | 11 refills | Status: DC
  Filled 2022-11-30: qty 5, 25d supply, fill #0
  Filled 2023-01-19: qty 5, 25d supply, fill #1

## 2022-12-01 ENCOUNTER — Telehealth: Payer: Self-pay

## 2022-12-01 ENCOUNTER — Other Ambulatory Visit (HOSPITAL_COMMUNITY): Payer: Self-pay

## 2022-12-01 NOTE — Patient Outreach (Signed)
  Care Coordination   12/01/2022 Name: Angie Cain MRN: [id number] DOB: August 26, 1956   Care Coordination Outreach Attempts:  A second unsuccessful outreach was attempted today to offer the patient with information about available care coordination services as a benefit of their health plan.     Follow Up Plan:  Additional outreach attempts will be made to offer the patient care coordination information and services.   Encounter Outcome:  No Answer   Care Coordination Interventions:  No, not indicated    Lazaro Arms RN, BSN, [facility]   Phone: [phone]

## 2022-12-16 ENCOUNTER — Encounter: Payer: Self-pay | Admitting: Student

## 2022-12-16 ENCOUNTER — Other Ambulatory Visit: Payer: Self-pay

## 2022-12-16 ENCOUNTER — Ambulatory Visit (INDEPENDENT_AMBULATORY_CARE_PROVIDER_SITE_OTHER): Payer: Medicare Other | Admitting: Student

## 2022-12-16 VITALS — BP 120/74 | HR 79 | Temp 98.0°F | Resp 24 | Ht 65.0 in | Wt 120.9 lb

## 2022-12-16 DIAGNOSIS — Z23 Encounter for immunization: Secondary | ICD-10-CM | POA: Diagnosis not present

## 2022-12-16 DIAGNOSIS — E119 Type 2 diabetes mellitus without complications: Secondary | ICD-10-CM

## 2022-12-16 DIAGNOSIS — Z7984 Long term (current) use of oral hypoglycemic drugs: Secondary | ICD-10-CM

## 2022-12-16 DIAGNOSIS — Z794 Long term (current) use of insulin: Secondary | ICD-10-CM

## 2022-12-16 DIAGNOSIS — Z0000 Encounter for general adult medical examination without abnormal findings: Secondary | ICD-10-CM

## 2022-12-16 NOTE — Progress Notes (Unsigned)
   CC: Diabetes follow-up  HPI:  [name] is a 67 y.o. female with PMH as below who presents to clinic to follow-up on her diabetes. Please see problem based charting for evaluation, assessment and plan.  Past Medical History:  Diagnosis Date   Allergy    Anxiety    occasional   Arthritis    Atherosclerotic peripheral vascular disease with intermittent claudication ([place])    s/p fem-fem bypass 2011. ABI 07/2012 0.5 R 0.65 L   Diabetes mellitus without complication (HCC)    GERD (gastroesophageal reflux disease)    Hyperlipidemia    Hypertension    IDDM (insulin dependent diabetes mellitus) 07/21/2007   July 2011: Femoral-femoral bypass  ABI 08/11/2012 notable for 0.4-0.59 on right ankle suggestive of severe arterial occlusive disease at rest and 0.60-0.7 on left ankle suggestive of moderate arterial occlusive disease at rest.    Iron deficiency anemia 05/08/2017   Low back pain 12/18/2013   Personal history of colonic polyps - adenomas 03/07/2014   Rotator cuff impingement syndrome of left shoulder 03/23/2016   Tobacco abuse     Review of Systems:  Constitutional: Negative for fever or fatigue Eyes: Negative for visual changes Respiratory: Negative for shortness of breath MSK: Positive for occasional joint pain. Negative for back pain Neuro: Negative for headache or weakness  Physical Exam: General: Pleasant, well-appearing elderly female.  No acute distress. Cardiac: RRR. No murmurs, rubs or gallops. No LE edema Respiratory: Lungs CTAB. No wheezing or crackles. Abdominal: Soft, symmetric and non tender. Normal BS. Skin: Warm, dry and intact without rashes or lesions Psych: Appropriate mood and affect.  Vitals:   12/16/22 1334  BP: 120/74  Pulse: 79  Resp: (!) 24  Temp: 98 F (36.7 C)  TempSrc: Oral  SpO2: 100%  Weight: 120 lb 14.4 oz (54.8 kg)  Height: '5\' 5"'$  (1.651 m)    Assessment & Plan:   Type 2 diabetes mellitus ([place]) Patient with a history of  uncontrolled diabetes (A1c 10.4% on 11/29) here for 1 month follow-up after increasing Trulicity from 0.08 mg to 1.5 mg weekly.  Patient reports she has been doing better with her diet over the last month, eating more fish, baked chicken, vegetables and cutting down on carbs. Her blood sugar readings show improved glucose control with lowest blood sugar of 81, highest of 188 and average of 135. She reports adherence to her antidiabetic regimen. I congratulated patient on the job well-done on getting her blood sugars under better control and encouraged her to continue this to help improve her A1c.  Plan: -Continue on Tresiba 22 units daily -Trulicity 1.5 mg weekly -Continue dapagliflozin-metformin 04-999 daily -Continue lifestyle modifications with dietary changes and exercise -Follow-up in 2 months for repeat A1c  Healthcare maintenance Received the Prevnar 20 shot today   See Encounters Tab for problem based charting.  Patient discussed with Dr. Emelia Salisbury, MD, MPH

## 2022-12-16 NOTE — Patient Instructions (Signed)
Thank you, Angie Cain for allowing [place] to provide your care today. Today we discussed your diabetes, recent eye surgery and mood. Your blood sugar readings are looking much better so continue your medications as prescribed and maintain your current diet.    You also received the pneumonia shot today.  My Chart Access: [email or web address]  Please follow-up in 2 months  Please make sure to arrive 15 minutes prior to your next appointment. If you arrive late, you may be asked to reschedule.    We look forward to seeing you next time. Please call our clinic at [phone] if you have any questions or concerns. The best time to call is Monday-Friday from 9am-4pm, but there is someone available 24/7. If after hours or the weekend, call the main hospital number and ask for the Internal Medicine Resident On-Call. If you need medication refills, please notify your pharmacy one week in advance and they will send [place] a request.   Thank you for letting us take part in your care. Wishing you the best!  Lacinda Axon, MD 12/16/2022, 2:28 PM IM Resident, PGY-3 Oswaldo Milian 41:10

## 2022-12-17 ENCOUNTER — Encounter: Payer: Self-pay | Admitting: Student

## 2022-12-17 NOTE — Assessment & Plan Note (Addendum)
Patient with a history of uncontrolled diabetes (A1c 10.4% on 11/29) here for 1 month follow-up after increasing Trulicity from 5.21 mg to 1.5 mg weekly.  Patient reports she has been doing better with her diet over the last month, eating more fish, baked chicken, vegetables and cutting down on carbs. Her blood sugar readings show improved glucose control with lowest blood sugar of 81, highest of 188 and average of 135. She reports adherence to her antidiabetic regimen. I congratulated patient on the job well-done on getting her blood sugars under better control and encouraged her to continue this to help improve her A1c.  Plan: -Continue on Tresiba 22 units daily -Trulicity 1.5 mg weekly -Continue dapagliflozin-metformin 04-999 daily -Continue lifestyle modifications with dietary changes and exercise -Follow-up in 2 months for repeat A1c

## 2022-12-17 NOTE — Assessment & Plan Note (Signed)
Received the Prevnar 20 shot today

## 2022-12-24 NOTE — Progress Notes (Signed)
Internal Medicine Clinic Attending  Case discussed with Dr. Amponsah  At the time of the visit.  We reviewed the resident's history and exam and pertinent patient test results.  I agree with the assessment, diagnosis, and plan of care documented in the resident's note.  

## 2022-12-30 ENCOUNTER — Other Ambulatory Visit (HOSPITAL_COMMUNITY): Payer: Self-pay

## 2022-12-30 ENCOUNTER — Ambulatory Visit: Payer: 59

## 2022-12-30 MED ORDER — BRIMONIDINE TARTRATE 0.2 % OP SOLN
1.0000 [drp] | Freq: Two times a day (BID) | OPHTHALMIC | 3 refills | Status: DC
  Filled 2022-12-30: qty 5, 30d supply, fill #0
  Filled 2023-04-16: qty 5, 30d supply, fill #1

## 2023-01-05 ENCOUNTER — Other Ambulatory Visit: Payer: Self-pay

## 2023-01-05 ENCOUNTER — Encounter (HOSPITAL_BASED_OUTPATIENT_CLINIC_OR_DEPARTMENT_OTHER): Payer: Self-pay | Admitting: Emergency Medicine

## 2023-01-05 ENCOUNTER — Emergency Department (HOSPITAL_BASED_OUTPATIENT_CLINIC_OR_DEPARTMENT_OTHER)
Admission: EM | Admit: 2023-01-05 | Discharge: 2023-01-05 | Disposition: A | Discharge status: Home / Self Care | Payer: 59 | Attending: Emergency Medicine | Admitting: Emergency Medicine

## 2023-01-05 DIAGNOSIS — M545 Low back pain, unspecified: Secondary | ICD-10-CM | POA: Diagnosis present

## 2023-01-05 DIAGNOSIS — M5441 Lumbago with sciatica, right side: Secondary | ICD-10-CM | POA: Diagnosis not present

## 2023-01-05 DIAGNOSIS — B029 Zoster without complications: Secondary | ICD-10-CM

## 2023-01-05 DIAGNOSIS — Z7982 Long term (current) use of aspirin: Secondary | ICD-10-CM | POA: Diagnosis not present

## 2023-01-05 DIAGNOSIS — Z794 Long term (current) use of insulin: Secondary | ICD-10-CM | POA: Insufficient documentation

## 2023-01-05 DIAGNOSIS — G8929 Other chronic pain: Secondary | ICD-10-CM

## 2023-01-05 LAB — BASIC METABOLIC PANEL
Anion gap: 10 (ref 5–15)
BUN: 14 mg/dL (ref 8–23)
CO2: 29 mmol/L (ref 22–32)
Calcium: 9.7 mg/dL (ref 8.9–10.3)
Chloride: 101 mmol/L (ref 98–111)
Creatinine, Ser: 0.7 mg/dL (ref 0.44–1.00)
GFR, Estimated: >60 mL/min (ref >60)
Glucose, Bld: 309 mg/dL — ABNORMAL HIGH (ref 70–99)
Potassium: 3 mmol/L — ABNORMAL LOW (ref 3.5–5.1)
Sodium: 140 mmol/L (ref 135–145)

## 2023-01-05 LAB — CBC
HCT: 39.7 % (ref 36.0–46.0)
Hemoglobin: 12.7 g/dL (ref 12.0–15.0)
MCH: 25 pg — ABNORMAL LOW (ref 26.0–34.0)
MCHC: 32 g/dL (ref 30.0–36.0)
MCV: 78.3 fL — ABNORMAL LOW (ref 80.0–100.0)
Platelets: 309 10*3/uL (ref 150–400)
RBC: 5.07 MIL/uL (ref 3.87–5.11)
RDW: 14.8 % (ref 11.5–15.5)
WBC: 6.2 10*3/uL (ref 4.0–10.5)
nRBC: 0 % (ref 0.0–0.2)

## 2023-01-05 LAB — URINALYSIS, ROUTINE W REFLEX MICROSCOPIC
Bilirubin Urine: NEGATIVE
Glucose, UA: >1000 mg/dL — AB
Hgb urine dipstick: NEGATIVE
Ketones, ur: NEGATIVE mg/dL
Leukocytes,Ua: NEGATIVE
Nitrite: NEGATIVE
Protein, ur: NEGATIVE mg/dL
Specific Gravity, Urine: 1.032 — ABNORMAL HIGH (ref 1.005–1.030)
pH: 6 (ref 5.0–8.0)

## 2023-01-05 MED ORDER — LIDOCAINE 5 % EX PTCH
1.0000 | MEDICATED_PATCH | CUTANEOUS | Status: DC
  Administered 2023-01-05: 1 via TRANSDERMAL
  Filled 2023-01-05: qty 1

## 2023-01-05 MED ORDER — HYDROCODONE-ACETAMINOPHEN 5-325 MG PO TABS
1.0000 | ORAL_TABLET | Freq: Once | ORAL | Status: AC
  Administered 2023-01-05: 1 via ORAL
  Filled 2023-01-05: qty 1

## 2023-01-05 MED ORDER — VALACYCLOVIR HCL 1 G PO TABS
1000.0000 mg | ORAL_TABLET | Freq: Three times a day (TID) | ORAL | 0 refills | Status: DC
  Filled 2023-01-05: qty 21, 7d supply, fill #0

## 2023-01-05 MED ORDER — KETOROLAC TROMETHAMINE 60 MG/2ML IM SOLN
30.0000 mg | Freq: Once | INTRAMUSCULAR | Status: AC
  Administered 2023-01-05: 30 mg via INTRAMUSCULAR
  Filled 2023-01-05: qty 2

## 2023-01-05 MED ORDER — POTASSIUM CHLORIDE CRYS ER 20 MEQ PO TBCR
40.0000 meq | EXTENDED_RELEASE_TABLET | Freq: Once | ORAL | Status: AC
  Administered 2023-01-05: 40 meq via ORAL
  Filled 2023-01-05: qty 2

## 2023-01-05 MED ORDER — HYDROCODONE-ACETAMINOPHEN 5-325 MG PO TABS
1.0000 | ORAL_TABLET | Freq: Four times a day (QID) | ORAL | 0 refills | Status: DC | PRN
  Filled 2023-01-05: qty 10, 2d supply, fill #0

## 2023-01-05 MED ORDER — LIDOCAINE 5 % EX PTCH
1.0000 | MEDICATED_PATCH | CUTANEOUS | 0 refills | Status: DC
  Filled 2023-01-05 – 2023-04-28 (×2): qty 30, 30d supply, fill #0

## 2023-01-05 MED ORDER — IBUPROFEN 600 MG PO TABS
600.0000 mg | ORAL_TABLET | Freq: Four times a day (QID) | ORAL | 0 refills | Status: DC | PRN
  Filled 2023-01-05: qty 30, 8d supply, fill #0

## 2023-01-05 NOTE — ED Triage Notes (Signed)
Pt c/o right sided flank pain that radiates around to abd x 6 mos, denies urinary symptoms

## 2023-01-05 NOTE — ED Provider Notes (Signed)
[facility] Provider Note   CSN: [id number] Arrival date & time: 01/05/23  1433     History  Chief Complaint  Patient presents with   Flank Pain    Angie Cain is a 67 y.o. female.   Flank Pain     67 year old female with a history of chronic low back pain and radiculopathy, peripheral vascular disease status post femorofemoral bypass in 2011 who presents to the emergency department with right thigh discomfort and acute on chronic low back pain.  The patient states that she has had low back pain with radiculopathy for years.  She recently underwent a stressful event with the death of her son.  She endorses a new character of pain this week with pain to light touch radiating from her back around to her abdomen.  It is sharp and "nagging."  She denies any rash or vesicles to her abdomen or back.  She denies any fevers or chills.  No recent falls or trauma.  She denies any new numbness or weakness.  She has been ambulatory.  She states that in the setting of her grief she has not taken her home diabetes medications but has access to them.  She denies any hematuria or urinary symptoms.  Home Medications Prior to Admission medications   Medication Sig Start Date End Date Taking? Authorizing Provider  HYDROcodone-acetaminophen (NORCO/VICODIN) 5-325 MG tablet Take 1-2 tablets by mouth every 6 (six) hours as needed. 01/05/23  Yes Regan Lemming, MD  ibuprofen (ADVIL) 600 MG tablet Take 1 tablet (600 mg total) by mouth every 6 (six) hours as needed. 01/05/23  Yes Regan Lemming, MD  lidocaine (LIDODERM) 5 % Place 1 patch onto the skin daily. Remove & Discard patch within 12 hours or as directed by MD 01/05/23  Yes Regan Lemming, MD  valACYclovir (VALTREX) 1000 MG tablet Take 1 tablet (1,000 mg total) by mouth 3 (three) times daily. 01/05/23  Yes Regan Lemming, MD  Accu-Chek FastClix Lancets MISC CHECK BLOOD SUGAR 3 TIMES A DAY 06/27/21   Sanjuan Dame, MD  Alcohol Swabs (B-D SINGLE USE SWABS REGULAR) PADS 1 each by Does not apply route 6 (six) times daily. 02/26/20   Chundi, Verne Spurr, MD  amlodipine-olmesartan (AZOR) 10-20 MG tablet Take 1 tablet by mouth daily. 08/26/22   Linward Natal, MD  aspirin EC 81 MG tablet Take 81 mg by mouth daily.    [provider]  atorvastatin (LIPITOR) 40 MG tablet Take 1 tablet (40 mg total) by mouth at bedtime. 08/26/22   Linward Natal, MD  Blood Glucose Calibration (ACCU-CHEK AVIVA) SOLN Use to check controls on diabetic glucose meter monthly 02/26/20   Chundi, Verne Spurr, MD  Blood Glucose Monitoring Suppl (ACCU-CHEK AVIVA PLUS) w/Device KIT Check blood sugar up to 3 times a day 02/26/20   Lars Mage, MD  brimonidine (ALPHAGAN) 0.2 % ophthalmic solution Place 1 drop into the left eye 2 (two) times daily. 12/30/22     calcium-vitamin D (OSCAL WITH D) 500-200 MG-UNIT tablet Take 1 tablet by mouth 2 (two) times daily. 03/17/19   Lars Mage, MD  cetirizine (ZYRTEC ALLERGY) 10 MG tablet Take 1 tablet (10 mg total) by mouth daily. 02/02/22   Piontek, Junie Panning, MD  Dapagliflozin Pro-metFORMIN ER (XIGDUO XR) 04-999 MG TB24 Take 1 tablet by mouth daily. 08/26/22   Linward Natal, MD  diclofenac Sodium (VOLTAREN) 1 % GEL Apply 2 g topically 4 (four) times daily. Patient taking differently: Apply 2 g topically  4 (four) times daily as needed (for pain). 06/12/21   Rehman, Areeg N, DO  Dulaglutide (TRULICITY) 1.5 OH/6.0VP SOPN Inject 1.5 mg into the skin once a week. 11/13/22   Iona Beard, MD  famotidine (PEPCID) 20 MG tablet Take 1 tablet (20 mg total) by mouth at bedtime. 02/02/22   Piontek, Junie Panning, MD  glucose blood (ACCU-CHEK GUIDE) test strip Check blood sugar 3 times per day 07/07/21   Lacinda Axon, MD  insulin degludec (TRESIBA FLEXTOUCH) 100 UNIT/ML FlexTouch Pen Inject 22 Units into the skin daily. 09/04/22   Lacinda Axon, MD  Insulin Pen Needle 32G X 4 MM MISC Use to inject insulin once daily  at bedtime 03/18/22   Atway, Rayann N, DO  Insulin Pen Needle 32G X 4 MM MISC Use to inject insulin once daily at bedtime 07/31/22   Lacinda Axon, MD  latanoprost (XALATAN) 0.005 % ophthalmic solution PLACE 1 DROP INTO BOTH EYES AT BEDTIME. 06/27/21   Sanjuan Dame, MD  mupirocin ointment (BACTROBAN) 2 % Apply 1 application topically daily. 02/02/22   Piontek, Junie Panning, MD  nystatin (MYCOSTATIN) 100000 UNIT/ML suspension Take 5 mLs (500,000 Units total) by mouth 4 (four) times daily for 10 days 09/29/22   Multani, Bhupinder, MD  timolol (TIMOPTIC) 0.5 % ophthalmic solution Place 1 drop into both eyes in the morning. 11/30/22         Allergies    Bupropion, Jardiance [empagliflozin], Metronidazole, Shellfish allergy, and Shrimp extract allergy skin test    Review of Systems   Review of Systems  Genitourinary:  Positive for flank pain.  All other systems reviewed and are negative.   Physical Exam Updated Vital Signs BP (!) 140/83 (BP Location: Right Arm)   Pulse 71   Temp 97.7 F (36.5 C) (Oral)   Resp 14   Ht '5\' 5"'$  (1.651 m)   Wt 54.4 kg   SpO2 100%   BMI 19.97 kg/m  Physical Exam Vitals and nursing note reviewed.  Constitutional:      General: She is not in acute distress.    Appearance: She is well-developed.  HENT:     Head: Normocephalic and atraumatic.  Eyes:     Conjunctiva/sclera: Conjunctivae normal.  Cardiovascular:     Rate and Rhythm: Normal rate and regular rhythm.  Pulmonary:     Effort: Pulmonary effort is normal. No respiratory distress.     Breath sounds: Normal breath sounds.  Abdominal:     Palpations: Abdomen is soft.     Tenderness: There is no abdominal tenderness.     Comments: Tenderness to palpation to light touch in a dermatomal distribution wrapping around the patient's abdomen.  No rash.  No rebound or guarding.  Musculoskeletal:        General: No swelling or tenderness.     Cervical back: Neck supple.     Comments: 1+ DP pulses  bilaterally, no midline tenderness to palpation of the cervical, thoracic or lumbar spine.  Some paraspinal muscular tenderness on the right.  Negative straight leg raise test bilaterally.  Intact range of motion of the bilateral lower extremities.  No tenderness of the bony aspects of the lower extremities.  No swelling of the joints.  Skin:    General: Skin is warm and dry.     Capillary Refill: Capillary refill takes less than 2 seconds.  Neurological:     Mental Status: She is alert.     Comments: 5 out of 5 strength in all  4 extremities and intact sensation to light touch  Psychiatric:        Mood and Affect: Mood normal.     ED Results / Procedures / Treatments   Labs (all labs ordered are listed, but only abnormal results are displayed) Labs Reviewed  URINALYSIS, ROUTINE W REFLEX MICROSCOPIC - Abnormal; Notable for the following components:      Result Value   Specific Gravity, Urine 1.032 (*)    Glucose, UA >1,000 (*)    Bacteria, UA RARE (*)    All other components within normal limits  BASIC METABOLIC PANEL - Abnormal; Notable for the following components:   Potassium 3.0 (*)    Glucose, Bld 309 (*)    All other components within normal limits  CBC - Abnormal; Notable for the following components:   MCV 78.3 (*)    MCH 25.0 (*)    All other components within normal limits    EKG None  Radiology No results found.  Procedures Procedures    Medications Ordered in ED Medications  ketorolac (TORADOL) injection 30 mg (has no administration in time range)  lidocaine (LIDODERM) 5 % 1 patch (has no administration in time range)  HYDROcodone-acetaminophen (NORCO/VICODIN) 5-325 MG per tablet 1 tablet (has no administration in time range)  potassium chloride SA (KLOR-CON M) CR tablet 40 mEq (has no administration in time range)    ED Course/ Medical Decision Making/ A&P                             Medical Decision Making Amount and/or Complexity of Data  Reviewed Labs: ordered.  Risk Prescription drug management.    67 year old female with a history of chronic low back pain and radiculopathy, peripheral vascular disease status post femorofemoral bypass in 2011 who presents to the emergency department with right thigh discomfort and acute on chronic low back pain.  The patient states that she has had low back pain with radiculopathy for years.  She recently underwent a stressful event with the death of her son.  She endorses a new character of pain this week with pain to light touch radiating from her back around to her abdomen.  It is sharp and "nagging."  She denies any rash or vesicles to her abdomen or back.  She denies any fevers or chills.  No recent falls or trauma.  She denies any new numbness or weakness.  She has been ambulatory.  She states that in the setting of her grief she has not taken her home diabetes medications but has access to them.  She denies any hematuria or urinary symptoms.  On arrival, the patient was vitally stable.  No red flag symptoms of cauda equina syndrome.  No recent falls or trauma.  Patient presenting with acute on chronic low back pain with associated sciatica.  Intact pulses bilaterally in the lower extremity given the patient's history of peripheral vascular disease.  No concern for acute arterial occlusion at this time.  Negative straight leg raise test, neurologically intact.  Patient with chronic low back pain however character of pain is different this week in the setting of an acute stressor, could be consistent with early shingles.  Will treat as such.  Will prescribe a course of Valtrex, Norco, lidocaine patch and advised NSAIDs for pain control.  Advised outpatient follow-up with her PCP.  Patient endorsed understanding.  Low concern for acute intra-abdominal or neurologic emergency or vascular emergency requiring  emergent intervention or further workup at this time based on the patient's history of present  illness and physical exam.  The patient has been appropriately medically screened and/or stabilized in the ED. I have low suspicion for any other emergent medical condition which would require further screening, evaluation or treatment in the ED or require inpatient management.   Final Clinical Impression(s) / ED Diagnoses Final diagnoses:  Chronic right-sided low back pain with right-sided sciatica  Herpes zoster without complication    Rx / DC Orders ED Discharge Orders          Ordered    valACYclovir (VALTREX) 1000 MG tablet  3 times daily        01/05/23 1946    HYDROcodone-acetaminophen (NORCO/VICODIN) 5-325 MG tablet  Every 6 hours PRN        01/05/23 1946    lidocaine (LIDODERM) 5 %  Every 24 hours        01/05/23 1946    ibuprofen (ADVIL) 600 MG tablet  Every 6 hours PRN        01/05/23 1946              Regan Lemming, MD 01/05/23 1954

## 2023-01-06 ENCOUNTER — Other Ambulatory Visit (HOSPITAL_COMMUNITY): Payer: Self-pay

## 2023-01-06 ENCOUNTER — Telehealth: Payer: Self-pay

## 2023-01-06 NOTE — Patient Outreach (Signed)
  Care Coordination [facility] Note Transition Care Management Follow-up Telephone Call Date of discharge and from where: [facility] 01/05/23 How have you been since you were released from the hospital? Patient notes her pain is under control and she is feeling better. Any questions or concerns? Yes- Patient is grieving as she recently lost her son.  Discussed if she would like to speak with a LCSW and she declined.  Patient also denied follow up with PCP as she does not feel ready to come to clinic.  Items Reviewed: Did the pt receive and understand the discharge instructions provided? Yes  Medications obtained and verified? Yes  Other? No  Any new allergies since your discharge? No  Dietary orders reviewed? No Do you have support at home? Yes   Home Care and Equipment/Supplies: Were home health services ordered? no If so, what is the name of the agency? N/a  Has the agency set up a time to come to the patient's home? not applicable Were any new equipment or medical supplies ordered?  No What is the name of the medical supply agency? N/a Were you able to get the supplies/equipment? not applicable Do you have any questions related to the use of the equipment or supplies? No  Functional Questionnaire: (I = Independent and D = Dependent) ADLs: I  Bathing/Dressing- I  Meal Prep- I  Eating- I  Maintaining continence- I  Transferring/Ambulation- I  Managing Meds- I  Follow up appointments reviewed:  PCP Hospital f/u appt confirmed? No  Patient declined f/u appt. She will call [facility] if anything changes. [facility] f/u appt confirmed? No   Are transportation arrangements needed? No  If their condition worsens, is the pt aware to call PCP or go to the Emergency Dept.? Yes Was the patient provided with contact information for the PCP's office or ED? Yes Was to pt encouraged to call back with questions or concerns? Yes  SDOH assessments and interventions completed:   Yes SDOH  Interventions Today    Flowsheet Row Most Recent Value  SDOH Interventions   Transportation Interventions Intervention Not Indicated  Utilities Interventions Intervention Not Indicated       Care Coordination Interventions:  No Care Coordination interventions needed at this time.   Encounter Outcome:  Pt. Visit Completed

## 2023-01-14 ENCOUNTER — Other Ambulatory Visit: Payer: Self-pay

## 2023-01-14 DIAGNOSIS — E1165 Type 2 diabetes mellitus with hyperglycemia: Secondary | ICD-10-CM

## 2023-01-14 NOTE — Telephone Encounter (Signed)
Rx refill for Losartan as well

## 2023-01-15 ENCOUNTER — Other Ambulatory Visit (HOSPITAL_COMMUNITY): Payer: Self-pay

## 2023-01-15 MED ORDER — TRESIBA FLEXTOUCH 100 UNIT/ML ~~LOC~~ SOPN
22.0000 [IU] | PEN_INJECTOR | Freq: Every day | SUBCUTANEOUS | 3 refills | Status: DC
  Filled 2023-01-15: qty 15, 68d supply, fill #0

## 2023-01-18 ENCOUNTER — Other Ambulatory Visit: Payer: Self-pay

## 2023-01-18 DIAGNOSIS — E1169 Type 2 diabetes mellitus with other specified complication: Secondary | ICD-10-CM

## 2023-01-18 DIAGNOSIS — E119 Type 2 diabetes mellitus without complications: Secondary | ICD-10-CM

## 2023-01-19 ENCOUNTER — Other Ambulatory Visit (HOSPITAL_COMMUNITY): Payer: Self-pay

## 2023-01-19 ENCOUNTER — Other Ambulatory Visit: Payer: Self-pay

## 2023-01-19 MED ORDER — ATORVASTATIN CALCIUM 40 MG PO TABS
40.0000 mg | ORAL_TABLET | Freq: Every day | ORAL | 3 refills | Status: AC
  Filled 2023-01-20 (×2): qty 90, 90d supply, fill #0

## 2023-01-19 MED ORDER — INSULIN PEN NEEDLE 32G X 4 MM MISC
4 refills | Status: DC
  Filled 2023-01-20: qty 100, 100d supply, fill #0

## 2023-01-19 MED ORDER — AMLODIPINE-OLMESARTAN 10-20 MG PO TABS
1.0000 | ORAL_TABLET | Freq: Every day | ORAL | 3 refills | Status: DC
  Filled 2023-01-19: qty 90, 90d supply, fill #0

## 2023-01-19 MED ORDER — BD SWAB SINGLE USE REGULAR PADS
1.0000 | MEDICATED_PAD | Freq: Every day | 1 refills | Status: DC
  Filled 2023-01-19: qty 600, 90d supply, fill #0

## 2023-01-19 MED ORDER — XIGDUO XR 5-1000 MG PO TB24
1.0000 | ORAL_TABLET | Freq: Every day | ORAL | 3 refills | Status: DC
  Filled 2023-01-19: qty 90, 90d supply, fill #0
  Filled 2023-06-14: qty 90, 90d supply, fill #1
  Filled 2023-10-11: qty 90, 90d supply, fill #2

## 2023-01-19 NOTE — Telephone Encounter (Signed)
Next appt scheduled 02/03/23 with PCP. 

## 2023-01-20 ENCOUNTER — Other Ambulatory Visit (HOSPITAL_COMMUNITY): Payer: Self-pay

## 2023-01-20 ENCOUNTER — Other Ambulatory Visit: Payer: Self-pay

## 2023-01-22 ENCOUNTER — Other Ambulatory Visit (HOSPITAL_COMMUNITY): Payer: Self-pay

## 2023-02-01 ENCOUNTER — Other Ambulatory Visit: Payer: Self-pay | Admitting: Student

## 2023-02-01 DIAGNOSIS — E1165 Type 2 diabetes mellitus with hyperglycemia: Secondary | ICD-10-CM

## 2023-02-03 ENCOUNTER — Ambulatory Visit (INDEPENDENT_AMBULATORY_CARE_PROVIDER_SITE_OTHER): Payer: 59 | Admitting: Student

## 2023-02-03 ENCOUNTER — Encounter: Payer: Self-pay | Admitting: Student

## 2023-02-03 VITALS — BP 144/80 | HR 78 | Temp 98.1°F | Ht 65.0 in | Wt 118.3 lb

## 2023-02-03 DIAGNOSIS — F331 Major depressive disorder, recurrent, moderate: Secondary | ICD-10-CM | POA: Diagnosis not present

## 2023-02-03 DIAGNOSIS — B029 Zoster without complications: Secondary | ICD-10-CM | POA: Diagnosis not present

## 2023-02-03 DIAGNOSIS — Z794 Long term (current) use of insulin: Secondary | ICD-10-CM | POA: Diagnosis not present

## 2023-02-03 DIAGNOSIS — E119 Type 2 diabetes mellitus without complications: Secondary | ICD-10-CM

## 2023-02-03 LAB — POCT GLYCOSYLATED HEMOGLOBIN (HGB A1C): Hemoglobin A1C: 8.3 % — AB (ref 4.0–5.6)

## 2023-02-03 LAB — GLUCOSE, CAPILLARY: Glucose-Capillary: 119 mg/dL — ABNORMAL HIGH (ref 70–99)

## 2023-02-03 NOTE — Patient Instructions (Addendum)
Thank you, [name] for allowing [place] to provide your care today. Today we discussed your diabetes, blood pressure, grief and depression.    Your diabetes, your A1c has improved to 8.3%. Congratulations on getting your A1c down.  I do not plan to make any changes to your medications at this time.  My condolence you and your family about the loss of his son.  I have referred you to our therapies for therapy as well as grief counseling. You can also follow-up with [facility] for free grief counseling. [facility] [address] [place], [place] [address] [phone]  I have ordered the following labs for you:   Lab Orders         Glucose, capillary         POC Hbg A1C      I will call if any are abnormal. All of your labs can be accessed through "My Chart".  I have place a referrals to behavioral health for therapy and grief counseling  My Chart Access: [email or web address]  Please follow-up in 3 months  Please make sure to arrive 15 minutes prior to your next appointment. If you arrive late, you may be asked to reschedule.    We look forward to seeing you next time. Please call our clinic at [phone] if you have any questions or concerns. The best time to call is Monday-Friday from 9am-4pm, but there is someone available 24/7. If after hours or the weekend, call the main hospital number and ask for the Internal Medicine Resident On-Call. If you need medication refills, please notify your pharmacy one week in advance and they will send [place] a request.   Thank you for letting us take part in your care. Wishing you the best!  Lacinda Axon, MD 02/03/2023, 1:57 PM IM Resident, PGY-3 Angie Cain 41:10

## 2023-02-03 NOTE — Assessment & Plan Note (Addendum)
Patient presenting today for clinic visit after recently losing one of her sons. Patient states she was called to her son's house but a month ago and found him struggling to breathe on the floor of the house with foam coming out of his mouth. They started CPR but patient passed before EMS arrived. The case has been sent to the medical examiner and currently pending final cause of death. She also reports that another son had a stroke a few weeks ago. She has been leaning on the Lord and feels calm in her heart while while she is grieving her son. She does not feel too much stress and states she has been doing better the last 2 weeks mentally. PHQ-9 today is 8. Discussed plan to refer to therapy for grief counseling and patient agreeable with plan.   Plan: -Referral to behavioral health for CBT and grief counseling -Provided information on free grief counseling at [facility].  -Follow-up as needed

## 2023-02-03 NOTE — Assessment & Plan Note (Signed)
Patient here for follow-up on her diabetes. A1c has improved from 10.4% to 8.3% today. Patient reports she lost her son about a month ago but has not been eating as much however she has been cutting out sugars in her diet. Readings from her meter shows an average blood sugar of 145 with a high of 264 and a low of 79. Will continue patient's current regimen follow-up in 3 months for repeat A1c.  Diabetic foot exam completed today with normal pulses and sensations.  Plan: -Continue Tresiba 22 units daily -Continue Trulicity 1.5 mg weekly -Continue dapagliflozin-metformin 04-999 mg daily -Follow-up in 3 months for repeat A1c

## 2023-02-03 NOTE — Progress Notes (Signed)
CC: Follow-up  HPI:  Angie Cain is a 67 y.o. female with PMH as below who presents to clinic to follow-up on her chronic medical problems. Please see problem based charting for evaluation, assessment and plan.  Past Medical History:  Diagnosis Date   Allergy    Anxiety    occasional   Arthritis    Atherosclerotic peripheral vascular disease with intermittent claudication ([place])    s/p fem-fem bypass 2011. ABI 07/2012 0.5 R 0.65 L   Diabetes mellitus without complication (HCC)    GERD (gastroesophageal reflux disease)    Hyperlipidemia    Hypertension    IDDM (insulin dependent diabetes mellitus) 07/21/2007   July 2011: Femoral-femoral bypass  ABI 08/11/2012 notable for 0.4-0.59 on right ankle suggestive of severe arterial occlusive disease at rest and 0.60-0.7 on left ankle suggestive of moderate arterial occlusive disease at rest.    Iron deficiency anemia 05/08/2017   Low back pain 12/18/2013   Oral thrush 09/29/2022   Personal history of colonic polyps - adenomas 03/07/2014   Rotator cuff impingement syndrome of left shoulder 03/23/2016   Tobacco abuse     Review of Systems:  Constitutional: Negative for fever or fatigue Eyes: Negative for visual changes Respiratory: Negative for shortness of breath MSK: Positive for chronic back pain Neuro: Positive for occasional numbness and tingling. Negative for headache or weakness Psych: Positive for depression and grieving  Physical Exam: General: Pleasant, well-appearing elderly woman. No acute distress. Cardiac: RRR. No murmurs, rubs or gallops. No LE edema Respiratory: Lungs CTAB. No wheezing or crackles. Abdominal: Soft, symmetric and non tender. Normal BS. Skin: Warm, dry and intact without rashes or lesions Extremities: Atraumatic.  Radial and DP pulses 2+ and symmetric. Neuro: A&O x 3. Moves all extremities.  Normal sensation to gross touch. Psych: Appropriate mood and affect.  Vitals:   02/03/23 1321  BP:  (!) 144/80  Pulse: 78  Temp: 98.1 F (36.7 C)  TempSrc: Oral  SpO2: 100%  Weight: 118 lb 4.8 oz (53.7 kg)  Height: 5' 5"$  (1.651 m)    Assessment & Plan:   Type 2 diabetes mellitus ([place]) Patient here for follow-up on her diabetes. A1c has improved from 10.4% to 8.3% today. Patient reports she lost her son about a month ago but has not been eating as much however she has been cutting out sugars in her diet. Readings from her meter shows an average blood sugar of 145 with a high of 264 and a low of 79. Will continue patient's current regimen follow-up in 3 months for repeat A1c.  Diabetic foot exam completed today with normal pulses and sensations.  Plan: -Continue Tresiba 22 units daily -Continue Trulicity 1.5 mg weekly -Continue dapagliflozin-metformin 04-999 mg daily -Follow-up in 3 months for repeat A1c  Herpes zoster without complication Here for follow-up after ER visit 1 month ago for pain on her right thigh as well as her right flank to her abdomen. Workup was overall negative. She was found to have pain to light touch in dermatomal distribution on her right flank and abdomen but no rash. Patient was diagnosed with early herpes zoster infection.  She was discharged home on lidocaine patch, valacyclovir 1000 mg 3 times daily for 7 days and as needed Advil. Patient reports the abdomen and flank pain has resolved but she does have some residual pain on her right thigh. Will continue to monitor and consider treatment for postherpetic neuralgia if symptoms persist.  Major depression Patient presenting today for  clinic visit after recently losing one of her sons. Patient states she was called to her son's house but a month ago and found him struggling to breathe on the floor of the house with foam coming out of his mouth. They started CPR but patient passed before EMS arrived. The case has been sent to the medical examiner and currently pending final cause of death. She also reports that  another son had a stroke a few weeks ago. She has been leaning on the Lord and feels calm in her heart while while she is grieving her son. She does not feel too much stress and states she has been doing better the last 2 weeks mentally. PHQ-9 today is 8. Discussed plan to refer to therapy for grief counseling and patient agreeable with plan.   Plan: -Referral to behavioral health for CBT and grief counseling -Provided information on free grief counseling at [facility].  -Follow-up as needed    See Encounters Tab for problem based charting.  Patient discussed with Dr. Michelle Nasuti, MD, MPH

## 2023-02-03 NOTE — Assessment & Plan Note (Addendum)
Here for follow-up after ER visit 1 month ago for pain on her right thigh as well as her right flank to her abdomen. Workup was overall negative. She was found to have pain to light touch in dermatomal distribution on her right flank and abdomen but no rash. Patient was diagnosed with early herpes zoster infection.  She was discharged home on lidocaine patch, valacyclovir 1000 mg 3 times daily for 7 days and as needed Advil. Patient reports the abdomen and flank pain has resolved but she does have some residual pain on her right thigh. Will continue to monitor and consider treatment for postherpetic neuralgia if symptoms persist.

## 2023-02-10 ENCOUNTER — Encounter: Payer: Medicare Other | Admitting: Student

## 2023-02-11 NOTE — Telephone Encounter (Signed)
ERROR

## 2023-02-15 ENCOUNTER — Institutional Professional Consult (permissible substitution): Payer: 59 | Admitting: Licensed Clinical Social Worker

## 2023-02-15 NOTE — Progress Notes (Deleted)
Patient no-showed today's appointment; appointment was for in-person visit at 1:30 am  Patient will need to reschedule appointment by calling Internal medicine center [phone].  Milus Height, MSW, [place]  [facility] Direct Dial:[phone]  Fax [phone] Main Office Phone: [phone] [place], [place] [address] Website: [place], [place]

## 2023-02-15 NOTE — Progress Notes (Signed)
Internal Medicine Clinic Attending  Case discussed with Dr. Amponsah  at the time of the visit.  We reviewed the resident's history and exam and pertinent patient test results.  I agree with the assessment, diagnosis, and plan of care documented in the resident's note.  

## 2023-02-16 ENCOUNTER — Other Ambulatory Visit: Payer: Self-pay | Admitting: Student

## 2023-02-16 DIAGNOSIS — E1165 Type 2 diabetes mellitus with hyperglycemia: Secondary | ICD-10-CM

## 2023-02-23 ENCOUNTER — Other Ambulatory Visit: Payer: Self-pay | Admitting: Student

## 2023-03-05 DIAGNOSIS — H1045 Other chronic allergic conjunctivitis: Secondary | ICD-10-CM | POA: Diagnosis not present

## 2023-03-05 DIAGNOSIS — H401131 Primary open-angle glaucoma, bilateral, mild stage: Secondary | ICD-10-CM | POA: Diagnosis not present

## 2023-03-05 DIAGNOSIS — H04123 Dry eye syndrome of bilateral lacrimal glands: Secondary | ICD-10-CM | POA: Diagnosis not present

## 2023-03-05 DIAGNOSIS — Z961 Presence of intraocular lens: Secondary | ICD-10-CM | POA: Diagnosis not present

## 2023-03-31 DIAGNOSIS — Z961 Presence of intraocular lens: Secondary | ICD-10-CM | POA: Diagnosis not present

## 2023-03-31 DIAGNOSIS — E119 Type 2 diabetes mellitus without complications: Secondary | ICD-10-CM | POA: Diagnosis not present

## 2023-03-31 DIAGNOSIS — H04123 Dry eye syndrome of bilateral lacrimal glands: Secondary | ICD-10-CM | POA: Diagnosis not present

## 2023-03-31 DIAGNOSIS — H0102B Squamous blepharitis left eye, upper and lower eyelids: Secondary | ICD-10-CM | POA: Diagnosis not present

## 2023-03-31 DIAGNOSIS — H0102A Squamous blepharitis right eye, upper and lower eyelids: Secondary | ICD-10-CM | POA: Diagnosis not present

## 2023-03-31 DIAGNOSIS — H401131 Primary open-angle glaucoma, bilateral, mild stage: Secondary | ICD-10-CM | POA: Diagnosis not present

## 2023-03-31 DIAGNOSIS — H1045 Other chronic allergic conjunctivitis: Secondary | ICD-10-CM | POA: Diagnosis not present

## 2023-04-14 ENCOUNTER — Other Ambulatory Visit: Payer: Self-pay | Admitting: Student

## 2023-04-14 DIAGNOSIS — I10 Essential (primary) hypertension: Secondary | ICD-10-CM

## 2023-04-15 ENCOUNTER — Emergency Department (HOSPITAL_COMMUNITY): Payer: 59

## 2023-04-15 ENCOUNTER — Emergency Department (HOSPITAL_COMMUNITY)
Admission: EM | Admit: 2023-04-15 | Discharge: 2023-04-15 | Disposition: A | Discharge status: Home / Self Care | Payer: 59 | Attending: Emergency Medicine | Admitting: Emergency Medicine

## 2023-04-15 ENCOUNTER — Ambulatory Visit (INDEPENDENT_AMBULATORY_CARE_PROVIDER_SITE_OTHER): Payer: 59 | Admitting: Student

## 2023-04-15 ENCOUNTER — Encounter (HOSPITAL_COMMUNITY): Payer: Self-pay

## 2023-04-15 ENCOUNTER — Other Ambulatory Visit: Payer: Self-pay

## 2023-04-15 DIAGNOSIS — Z1152 Encounter for screening for COVID-19: Secondary | ICD-10-CM | POA: Insufficient documentation

## 2023-04-15 DIAGNOSIS — M791 Myalgia, unspecified site: Secondary | ICD-10-CM | POA: Diagnosis present

## 2023-04-15 DIAGNOSIS — I10 Essential (primary) hypertension: Secondary | ICD-10-CM | POA: Insufficient documentation

## 2023-04-15 DIAGNOSIS — Z794 Long term (current) use of insulin: Secondary | ICD-10-CM | POA: Diagnosis not present

## 2023-04-15 DIAGNOSIS — E876 Hypokalemia: Secondary | ICD-10-CM | POA: Diagnosis not present

## 2023-04-15 DIAGNOSIS — R5383 Other fatigue: Secondary | ICD-10-CM | POA: Diagnosis not present

## 2023-04-15 DIAGNOSIS — E119 Type 2 diabetes mellitus without complications: Secondary | ICD-10-CM | POA: Insufficient documentation

## 2023-04-15 DIAGNOSIS — Z79899 Other long term (current) drug therapy: Secondary | ICD-10-CM | POA: Insufficient documentation

## 2023-04-15 DIAGNOSIS — Z7982 Long term (current) use of aspirin: Secondary | ICD-10-CM | POA: Insufficient documentation

## 2023-04-15 DIAGNOSIS — I6789 Other cerebrovascular disease: Secondary | ICD-10-CM | POA: Insufficient documentation

## 2023-04-15 LAB — CBC WITH DIFFERENTIAL/PLATELET
Abs Immature Granulocytes: 0.01 10*3/uL (ref 0.00–0.07)
Basophils Absolute: 0 10*3/uL (ref 0.0–0.1)
Basophils Relative: 1 %
Eosinophils Absolute: 0 10*3/uL (ref 0.0–0.5)
Eosinophils Relative: 1 %
HCT: 39.4 % (ref 36.0–46.0)
Hemoglobin: 12.2 g/dL (ref 12.0–15.0)
Immature Granulocytes: 0 %
Lymphocytes Relative: 31 %
Lymphs Abs: 1.7 10*3/uL (ref 0.7–4.0)
MCH: 24.9 pg — ABNORMAL LOW (ref 26.0–34.0)
MCHC: 31 g/dL (ref 30.0–36.0)
MCV: 80.6 fL (ref 80.0–100.0)
Monocytes Absolute: 0.5 10*3/uL (ref 0.1–1.0)
Monocytes Relative: 8 %
Neutro Abs: 3.3 10*3/uL (ref 1.7–7.7)
Neutrophils Relative %: 59 %
Platelets: 332 10*3/uL (ref 150–400)
RBC: 4.89 MIL/uL (ref 3.87–5.11)
RDW: 15 % (ref 11.5–15.5)
WBC: 5.6 10*3/uL (ref 4.0–10.5)
nRBC: 0 % (ref 0.0–0.2)

## 2023-04-15 LAB — COMPREHENSIVE METABOLIC PANEL
ALT: 8 U/L (ref 0–44)
AST: 12 U/L — ABNORMAL LOW (ref 15–41)
Albumin: 3.4 g/dL — ABNORMAL LOW (ref 3.5–5.0)
Alkaline Phosphatase: 67 U/L (ref 38–126)
Anion gap: 9 (ref 5–15)
BUN: 12 mg/dL (ref 8–23)
CO2: 28 mmol/L (ref 22–32)
Calcium: 8.7 mg/dL — ABNORMAL LOW (ref 8.9–10.3)
Chloride: 103 mmol/L (ref 98–111)
Creatinine, Ser: 0.69 mg/dL (ref 0.44–1.00)
GFR, Estimated: >60 mL/min (ref >60)
Glucose, Bld: 156 mg/dL — ABNORMAL HIGH (ref 70–99)
Potassium: 2.8 mmol/L — ABNORMAL LOW (ref 3.5–5.1)
Sodium: 140 mmol/L (ref 135–145)
Total Bilirubin: 0.6 mg/dL (ref 0.3–1.2)
Total Protein: 6.5 g/dL (ref 6.5–8.1)

## 2023-04-15 LAB — URINALYSIS, ROUTINE W REFLEX MICROSCOPIC
Bacteria, UA: NONE SEEN
Bilirubin Urine: NEGATIVE
Glucose, UA: >=500 mg/dL — AB
Hgb urine dipstick: NEGATIVE
Ketones, ur: NEGATIVE mg/dL
Leukocytes,Ua: NEGATIVE
Nitrite: NEGATIVE
Protein, ur: NEGATIVE mg/dL
Specific Gravity, Urine: 1.025 (ref 1.005–1.030)
pH: 5 (ref 5.0–8.0)

## 2023-04-15 LAB — MAGNESIUM: Magnesium: 1.5 mg/dL — ABNORMAL LOW (ref 1.7–2.4)

## 2023-04-15 LAB — CBG MONITORING, ED: Glucose-Capillary: 166 mg/dL — ABNORMAL HIGH (ref 70–99)

## 2023-04-15 LAB — CK: Total CK: 72 U/L (ref 38–234)

## 2023-04-15 LAB — TSH: TSH: 0.492 u[IU]/mL (ref 0.350–4.500)

## 2023-04-15 LAB — ETHANOL: Alcohol, Ethyl (B): <10 mg/dL (ref <10)

## 2023-04-15 LAB — SARS CORONAVIRUS 2 BY RT PCR: SARS Coronavirus 2 by RT PCR: NEGATIVE

## 2023-04-15 MED ORDER — POTASSIUM CHLORIDE 20 MEQ PO PACK
60.0000 meq | PACK | Freq: Once | ORAL | Status: AC
  Administered 2023-04-15: 60 meq via ORAL
  Filled 2023-04-15: qty 3

## 2023-04-15 MED ORDER — MAGNESIUM OXIDE -MG SUPPLEMENT 400 (240 MG) MG PO TABS
800.0000 mg | ORAL_TABLET | Freq: Once | ORAL | Status: AC
  Administered 2023-04-15: 800 mg via ORAL
  Filled 2023-04-15: qty 2

## 2023-04-15 MED ORDER — POTASSIUM CHLORIDE 10 MEQ/100ML IV SOLN
10.0000 meq | INTRAVENOUS | Status: AC
  Administered 2023-04-15 (×2): 10 meq via INTRAVENOUS
  Filled 2023-04-15 (×2): qty 100

## 2023-04-15 MED ORDER — MAGNESIUM OXIDE 400 MG PO CAPS
400.0000 mg | ORAL_CAPSULE | Freq: Every day | ORAL | 0 refills | Status: AC
  Filled 2023-04-15: qty 5, 5d supply, fill #0

## 2023-04-15 MED ORDER — POTASSIUM CHLORIDE ER 10 MEQ PO TBCR
10.0000 meq | EXTENDED_RELEASE_TABLET | Freq: Two times a day (BID) | ORAL | 0 refills | Status: DC
  Filled 2023-04-15: qty 10, 5d supply, fill #0

## 2023-04-15 MED ORDER — LACTATED RINGERS IV BOLUS
1000.0000 mL | Freq: Once | INTRAVENOUS | Status: AC
  Administered 2023-04-15: 1000 mL via INTRAVENOUS

## 2023-04-15 MED ORDER — ACETAMINOPHEN 500 MG PO TABS
1000.0000 mg | ORAL_TABLET | Freq: Once | ORAL | Status: AC
  Administered 2023-04-15: 1000 mg via ORAL
  Filled 2023-04-15: qty 2

## 2023-04-15 NOTE — Discharge Instructions (Addendum)
You were seen for generalized fatigue and weakness.  You are given IV fluids, potassium and magnesium.  You are found to have slightly low potassium and magnesium levels.  We are sending you with prescriptions for continued potassium and magnesium supplementation.  Your CT scan, chest x-ray and other labs were all reassuring.  There were no signs of infection on your urine or chest x-ray.  Follow-up with your primary care regarding your visit to the ER today.  Get repeat blood work checked of your potassium and magnesium levels to ensure they are improving.  I have also included resources for local therapy and psychiatric follow-up for depression.  Come back if any focal weakness of the arms or legs, new facial droop, slurred speech, or any other symptoms concerning to you.

## 2023-04-15 NOTE — ED Provider Notes (Signed)
[place] EMERGENCY DEPARTMENT AT [facility] Provider Note   CSN: [id number] Arrival date & time: 04/15/23  1457     History  Chief Complaint  Patient presents with   Generalized Body Aches    Angie Cain is a 66 y.o. female. With pmh HTN, HLD, GERD, IDDM, anxiety presenting with generalized fatigue and aches and not feeling right since her son passed away in Dec 23, 2022.  Patient has been feeling as she reports out of sorts since her son passed away back in 2022-12-23.  She will sometimes take her medications and then forget to take her medications and feeling out of it as she does not have a routine as she puts it.  She has had no recent illness, no fevers, no vomiting, no diarrhea.  No chest pain or shortness of breath.  No focal weakness numbness or tingling.  She is not on any medicines for depression or anxiety.  She was had seen her PCP recommended she see a therapist but she has not seen a therapist.  She has no active SI/HI/AVH.  No significant alcohol or drug use. HPI     Home Medications Prior to Admission medications   Medication Sig Start Date End Date Taking? Authorizing Provider  Magnesium Oxide 400 MG CAPS Take 1 capsule (400 mg total) by mouth daily for 5 days. 04/15/23 04/20/23 Yes Mardene Sayer, MD  potassium chloride (KLOR-CON) 10 MEQ tablet Take 1 tablet (10 mEq total) by mouth 2 (two) times daily for 5 days. 04/15/23 04/20/23 Yes Mardene Sayer, MD  Accu-Chek FastClix Lancets MISC CHECK BLOOD SUGAR 3 TIMES A DAY 06/27/21   Evlyn Kanner, MD  Alcohol Swabs (B-D SINGLE USE SWABS REGULAR) PADS Use 1 pad 6 (six) times daily. 01/19/23   Steffanie Rainwater, MD  amlodipine-olmesartan (AZOR) 10-20 MG tablet Take 1 tablet by mouth daily. 01/19/23   Steffanie Rainwater, MD  aspirin EC 81 MG tablet Take 81 mg by mouth daily.    [provider]  atorvastatin (LIPITOR) 40 MG tablet Take 1 tablet (40 mg total) by mouth at bedtime. 01/19/23   Steffanie Rainwater, MD  Blood Glucose Calibration (ACCU-CHEK AVIVA) SOLN Use to check controls on diabetic glucose meter monthly 02/26/20   Chundi, Sherlyn Lees, MD  Blood Glucose Monitoring Suppl (ACCU-CHEK AVIVA PLUS) w/Device KIT Check blood sugar up to 3 times a day 02/26/20   Lorenso Courier, MD  brimonidine (ALPHAGAN) 0.2 % ophthalmic solution Place 1 drop into the left eye 2 (two) times daily. 12/30/22     calcium-vitamin D (OSCAL WITH D) 500-200 MG-UNIT tablet Take 1 tablet by mouth 2 (two) times daily. 03/17/19   Lorenso Courier, MD  cetirizine (ZYRTEC ALLERGY) 10 MG tablet Take 1 tablet (10 mg total) by mouth daily. 02/02/22   Piontek, Denny Peon, MD  Dapagliflozin Pro-metFORMIN ER (XIGDUO XR) 04-999 MG TB24 Take 1 tablet by mouth daily. 01/19/23   Steffanie Rainwater, MD  diclofenac Sodium (VOLTAREN) 1 % GEL Apply 2 g topically 4 (four) times daily. Patient taking differently: Apply 2 g topically 4 (four) times daily as needed (for pain). 06/12/21   Rehman, Areeg N, DO  Dulaglutide (TRULICITY) 1.5 MG/0.5ML SOPN Inject 1.5 mg into the skin once a week. 11/13/22   Quincy Simmonds, MD  famotidine (PEPCID) 20 MG tablet Take 1 tablet (20 mg total) by mouth at bedtime. 02/02/22   Piontek, Denny Peon, MD  glucose blood (ACCU-CHEK GUIDE) test strip Check blood sugar 3 times per  day 07/07/21   Steffanie Rainwater, MD  ibuprofen (ADVIL) 600 MG tablet Take 1 tablet (600 mg total) by mouth every 6 (six) hours as needed. 01/05/23   Ernie Avena, MD  insulin degludec (TRESIBA FLEXTOUCH) 100 UNIT/ML FlexTouch Pen Inject 22 Units into the skin daily. 01/15/23   Steffanie Rainwater, MD  Insulin Pen Needle 32G X 4 MM MISC Use to inject insulin once daily at bedtime 01/19/23   Steffanie Rainwater, MD  latanoprost (XALATAN) 0.005 % ophthalmic solution PLACE 1 DROP INTO BOTH EYES AT BEDTIME. 06/27/21   Evlyn Kanner, MD  lidocaine (LIDODERM) 5 % Place 1 patch onto the skin daily. Remove & Discard patch within 12 hours or as directed by MD 01/05/23   Ernie Avena, MD  mupirocin ointment (BACTROBAN) 2 % Apply 1 application topically daily. 02/02/22   Piontek, Denny Peon, MD  timolol (TIMOPTIC) 0.5 % ophthalmic solution Place 1 drop into both eyes in the morning. 11/30/22     valACYclovir (VALTREX) 1000 MG tablet Take 1 tablet (1,000 mg total) by mouth 3 (three) times daily. 01/05/23   Ernie Avena, MD      Allergies    Bupropion, Jardiance [empagliflozin], Metronidazole, Shellfish allergy, and Shrimp extract    Review of Systems   Review of Systems  Physical Exam Updated Vital Signs BP 122/67   Pulse 68   Temp 97.9 F (36.6 C) (Oral)   Resp 19   Ht 5\' 5"  (1.651 m)   Wt 53 kg   SpO2 100%   BMI 19.44 kg/m  Physical Exam Constitutional: Alert and oriented.  Thin appearing woman but no acute distress, nontoxic, GCS 15 Eyes: Conjunctivae are normal. ENT      Head: Normocephalic and atraumatic. Cardiovascular: S1, S2,  Normal and symmetric distal pulses are present in all extremities.Warm and well perfused. Respiratory: Normal respiratory effort. Breath sounds are normal.  O2 sat 100 on RA Gastrointestinal: Soft and nontender.  Musculoskeletal: Normal range of motion in all extremities.      Right lower leg: No tenderness or edema.      Left lower leg: No tenderness or edema. Neurologic: Normal speech and language.  GCS 15.  AOx4.  Moving all 4 extremities equally.  Sensation grossly intact.  No gross focal neurologic deficits are appreciated. Skin: Skin is warm, dry and intact. No rash noted. Psychiatric: Mood and affect are normal. Speech and behavior are normal.  ED Results / Procedures / Treatments   Labs (all labs ordered are listed, but only abnormal results are displayed) Labs Reviewed  COMPREHENSIVE METABOLIC PANEL - Abnormal; Notable for the following components:      Result Value   Potassium 2.8 (*)    Glucose, Bld 156 (*)    Calcium 8.7 (*)    Albumin 3.4 (*)    AST 12 (*)    All other components within normal limits   CBC WITH DIFFERENTIAL/PLATELET - Abnormal; Notable for the following components:   MCH 24.9 (*)    All other components within normal limits  URINALYSIS, ROUTINE W REFLEX MICROSCOPIC - Abnormal; Notable for the following components:   APPearance HAZY (*)    Glucose, UA >=500 (*)    All other components within normal limits  MAGNESIUM - Abnormal; Notable for the following components:   Magnesium 1.5 (*)    All other components within normal limits  CBG MONITORING, ED - Abnormal; Notable for the following components:   Glucose-Capillary 166 (*)    All  other components within normal limits  SARS CORONAVIRUS 2 BY RT PCR  ETHANOL  CK  TSH    EKG EKG Interpretation  Date/Time:  Thursday Apr 15 2023 15:47:23 EDT Ventricular Rate:  74 PR Interval:  128 QRS Duration: 84 QT Interval:  470 QTC Calculation: 522 R Axis:   30 Text Interpretation: Sinus rhythm Multiple premature complexes, vent & supraven Low voltage, precordial leads Prolonged QT interval Confirmed by Vivien Rossetti ([id number]) on 04/15/2023 4:16:42 PM  Radiology CT Head Wo Contrast  Result Date: 04/15/2023 CLINICAL DATA:  Increasing confusion, lethargy EXAM: CT HEAD WITHOUT CONTRAST TECHNIQUE: Contiguous axial images were obtained from the base of the skull through the vertex without intravenous contrast. RADIATION DOSE REDUCTION: This exam was performed according to the departmental dose-optimization program which includes automated exposure control, adjustment of the mA and/or kV according to patient size and/or use of iterative reconstruction technique. COMPARISON:  None Available. FINDINGS: Brain: No acute intracranial findings are seen. There are no signs of bleeding within the cranium. Cortical sulci are prominent. There is decreased density in periventricular white matter. Minimal calcifications are seen in basal ganglia. Vascular: Scattered arterial calcifications are seen. Skull: No acute findings are seen.  Sinuses/Orbits: Unremarkable. Other: None. IMPRESSION: No acute intracranial findings are seen. Atrophy. Small vessel disease. Electronically Signed   By: Ernie Avena M.D.   On: 04/15/2023 18:01   DG Chest 2 View  Result Date: 04/15/2023 CLINICAL DATA:  Aches, flu symptoms. EXAM: CHEST - 2 VIEW COMPARISON:  None Available. FINDINGS: Clear lungs. Normal heart size. Atherosclerotic calcifications of the aortic arch. No pleural effusion or pneumothorax. Visualized bones and upper abdomen are unremarkable. IMPRESSION: 1.  No evidence of acute cardiopulmonary disease. 2.  Aortic Atherosclerosis (ICD10-I70.0). Electronically Signed   By: Orvan Falconer M.D.   On: 04/15/2023 16:40    Procedures .Critical Care  Performed by: Mardene Sayer, MD Authorized by: Mardene Sayer, MD   Critical care provider statement:    Critical care time (minutes):  30   Critical care was necessary to treat or prevent imminent or life-threatening deterioration of the following conditions:  Metabolic crisis   Critical care was time spent personally by me on the following activities:  Development of treatment plan with patient or surrogate, evaluation of patient's response to treatment, examination of patient, ordering and review of laboratory studies, ordering and review of radiographic studies, ordering and performing treatments and interventions, pulse oximetry, re-evaluation of patient's condition, review of old charts and obtaining history from patient or surrogate     Medications Ordered in ED Medications  lactated ringers bolus 1,000 mL (0 mLs Intravenous Stopped 04/15/23 1758)  acetaminophen (TYLENOL) tablet 1,000 mg (1,000 mg Oral Given 04/15/23 1542)  potassium chloride (KLOR-CON) packet 60 mEq (60 mEq Oral Given 04/15/23 1728)  potassium chloride 10 mEq in 100 mL IVPB (10 mEq Intravenous New Bag/Given 04/15/23 1828)  magnesium oxide (MAG-OX) tablet 800 mg (800 mg Oral Given 04/15/23 1827)    ED  Course/ Medical Decision Making/ A&P Clinical Course as of 04/15/23 1946  Thu Apr 15, 2023  1937 I reassessed the patient and she feels significantly better.  She tells me she really needed the fluids and now she feels significantly improved.  I am discharging patient with potassium and magnesium supplementation.  She has follow-up planned with her PCP area.  I have also provided her with psychiatric resources as she does not require acute psychiatric evaluation, no SI/HI/AVH.  She feels very  comfortable with this plan and is thankful for treatment today.  She is good for discharge. [VB]    Clinical Course User Index [VB] Mardene Sayer, MD                             Medical Decision Making  Angie Cain is a 68 y.o. female. With pmh HTN, HLD, GERD, IDDM, anxiety presenting with generalized fatigue and aches and not feeling right since her son passed away in 2023/01/06.     Patient describes overall weakness or fatigue and fatigue, rather than any particular focal weakness.  Glucose 166.EKG generally unchanged from prior, slightly more PVCs and known prolonged QTc.  There are no acute ST/T changes concerning for ischemia.  She is without chest pain I am not concerned for ACS.   Regarding patient's generalized fatigue, I do suspect patient's fatigue and generalized weakness and complaints are related to depression from son passing away.  However with her age and underlying medical issues, will evaluate for broad differential  includes but is not limited to acute electrolyte disturbances (such as hypokalemia, hypoglycemia, hypoMg, hypoCa), thyroid disturbance, infection,  anemia, dehydration among multiple other etiologies. Intact neurologic exam and chronic nature of symptoms make CNS etiology such as stroke, tumor unlikely.     CT head obtained which was unremarkable no acute findings.  Chest x-ray reviewed by me no evidence of pneumonia.  UA without evidence of UTI.  Labs reviewed by me  no anemia hemoglobin 12.2.  Creatinine 0.69 within normal limits.  Glucose 156.  Noted to have hypokalemia 2.8 and hypomagnesemia 1.5.  Likely contributing to patient's generalized fatigue and weakness and slightly prolonged QTc.  She was given IV and p.o. supplementation in the ED.  I reassessed the patient and she feels significantly better.  She tells me she really needed the fluids and now she feels significantly improved.  I am discharging patient with potassium and magnesium supplementation.  She has follow-up planned with her PCP area.  I have also provided her with psychiatric resources as she does not require acute psychiatric evaluation, no SI/HI/AVH.  She feels very comfortable with this plan and is thankful for treatment today.  She is good for discharge. [VB]  Amount and/or Complexity of Data Reviewed Labs: ordered. Radiology: ordered.  Risk OTC drugs. Prescription drug management.     Final Clinical Impression(s) / ED Diagnoses Final diagnoses:  Other fatigue  Hypokalemia  Hypomagnesemia    Rx / DC Orders ED Discharge Orders          Ordered    potassium chloride (KLOR-CON) 10 MEQ tablet  2 times daily        04/15/23 1943    Magnesium Oxide 400 MG CAPS  Daily        04/15/23 1943              Mardene Sayer, MD 04/15/23 1946

## 2023-04-15 NOTE — ED Triage Notes (Signed)
C/o generalized pain "all over and confusion".  Recent death of son in 01-11-2023 and just hasn't been the same since.  A&O x4 in triage. Pt ambulatory to triage.  Hx diabetes.

## 2023-04-15 NOTE — ED Notes (Signed)
Pt ambulated to bathroom w/o assist. Steady gait. No LOB noted

## 2023-04-16 ENCOUNTER — Other Ambulatory Visit (HOSPITAL_COMMUNITY): Payer: Self-pay

## 2023-04-20 ENCOUNTER — Telehealth: Payer: Self-pay

## 2023-04-20 NOTE — Transitions of Care (Post Inpatient/ED Visit) (Signed)
04/20/2023  Name: Angie Cain MRN: [id number] DOB: 05-Oct-1956  Today's TOC FU Call Status:    Transition Care Management Follow-up Telephone Call Date of Discharge: 04/15/23 Discharge Facility: [facility]) Type of Discharge: Emergency Department Reason for ED Visit: Other: (Tired, dizzy) How have you been since you were released from the hospital?: Same Any questions or concerns?: No  Items Reviewed: Did you receive and understand the discharge instructions provided?: Yes Medications obtained,verified, and reconciled?: Yes (Medications Reviewed) Any new allergies since your discharge?: No Dietary orders reviewed?: No Do you have support at home?: Yes People in Home: spouse Name of Support/Comfort Primary Source: Peyton Najjar  Medications Reviewed Today: Medications Reviewed Today     Reviewed by Jodelle Gross, RN (Case Manager) on 04/20/23 at 1637  Med List Status: <None>   Medication Order Taking? Sig Documenting Provider Last Dose Status Informant  Accu-Chek FastClix Lancets MISC [id number] No CHECK BLOOD SUGAR 3 TIMES A Areta Haber, MD Taking Active Self  Alcohol Swabs (B-D SINGLE USE SWABS REGULAR) PADS [id number]  Use 1 pad 6 (six) times daily. Steffanie Rainwater, MD  Active   amlodipine-olmesartan (AZOR) 10-20 MG tablet [id number]  Take 1 tablet by mouth daily. Steffanie Rainwater, MD  Active   aspirin EC 81 MG tablet [id number] No Take 81 mg by mouth daily. [provider] Taking Active Self  atorvastatin (LIPITOR) 40 MG tablet [id number]  Take 1 tablet (40 mg total) by mouth at bedtime. Steffanie Rainwater, MD  Active   Blood Glucose Calibration (ACCU-CHEK AVIVA) SOLN [id number] No Use to check controls on diabetic glucose meter monthly Chundi, Vahini, MD Taking Active Self  Blood Glucose Monitoring Suppl (ACCU-CHEK AVIVA PLUS) w/Device KIT [id number] No Check blood sugar up to 3 times a day Chundi, Sherlyn Lees, MD Taking Active Self  brimonidine (ALPHAGAN)  0.2 % ophthalmic solution [id number]  Place 1 drop into the left eye 2 (two) times daily.   Active   calcium-vitamin D (OSCAL WITH D) 500-200 MG-UNIT tablet [id number] No Take 1 tablet by mouth 2 (two) times daily. Lorenso Courier, MD Taking Active Self  cetirizine (ZYRTEC ALLERGY) 10 MG tablet [id number] No Take 1 tablet (10 mg total) by mouth daily. Jannifer Franklin, MD Taking Active   Dapagliflozin Pro-metFORMIN ER (XIGDUO XR) 04-999 MG TB24 [id number]  Take 1 tablet by mouth daily. Steffanie Rainwater, MD  Active   diclofenac Sodium (VOLTAREN) 1 % GEL [id number] No Apply 2 g topically 4 (four) times daily.  Patient taking differently: Apply 2 g topically 4 (four) times daily as needed (for pain).   Jaci Standard, DO Taking Active Self           Med Note Antony Madura, Arn Medal   Wed Aug 27, 2021  3:30 PM) "Not very effective at all"  Dulaglutide (TRULICITY) 1.5 MG/0.5ML SOPN [id number]  Inject 1.5 mg into the skin once a week. Quincy Simmonds, MD  Active   famotidine (PEPCID) 20 MG tablet [id number] No Take 1 tablet (20 mg total) by mouth at bedtime. Jannifer Franklin, MD Taking Active   glucose blood (ACCU-CHEK GUIDE) test strip [id number] No Check blood sugar 3 times per day Steffanie Rainwater, MD Taking Active Self  ibuprofen (ADVIL) 600 MG tablet [id number]  Take 1 tablet (600 mg total) by mouth every 6 (six) hours as needed. Ernie Avena, MD  Active   insulin degludec (TRESIBA FLEXTOUCH) 100 UNIT/ML FlexTouch Pen [id number]  Inject 22 Units into the skin daily. Sharrell Ku  M, MD  Active   Insulin Pen Needle 32G X 4 MM MISC [id number]  Use to inject insulin once daily at bedtime Steffanie Rainwater, MD  Active   latanoprost (XALATAN) 0.005 % ophthalmic solution [id number] No PLACE 1 DROP INTO BOTH EYES AT BEDTIME. Evlyn Kanner, MD Taking Active Self  lidocaine (LIDODERM) 5 % [id number]  Place 1 patch onto the skin daily. Remove & Discard patch within 12 hours or as directed by MD Ernie Avena, MD  Active    Magnesium Oxide 400 MG CAPS [id number]  Take 1 capsule (400 mg total) by mouth daily for 5 days. Mardene Sayer, MD  Active   mupirocin ointment (BACTROBAN) 2 % [id number] No Apply 1 application topically daily. Jannifer Franklin, MD Taking Active   potassium chloride (KLOR-CON) 10 MEQ tablet [id number]  Take 1 tablet (10 mEq total) by mouth 2 (two) times daily for 5 days. Mardene Sayer, MD  Active   timolol (TIMOPTIC) 0.5 % ophthalmic solution [id number]  Place 1 drop into both eyes in the morning.   Active   valACYclovir (VALTREX) 1000 MG tablet [id number]  Take 1 tablet (1,000 mg total) by mouth 3 (three) times daily. Ernie Avena, MD  Active             Home Care and Equipment/Supplies: Were Home Health Services Ordered?: No Any new equipment or medical supplies ordered?: No  Functional Questionnaire: Do you need assistance with bathing/showering or dressing?: No Do you need assistance with meal preparation?: No Do you need assistance with eating?: No Do you have difficulty maintaining continence: No Do you need assistance with getting out of bed/getting out of a chair/moving?: No Do you have difficulty managing or taking your medications?: No  Follow up appointments reviewed: PCP Follow-up appointment confirmed?: Yes Date of PCP follow-up appointment?: 04/27/23 Follow-up Provider: Dr. Geraldo Pitter Specialist [facility] Follow-up appointment confirmed?: NA Do you need transportation to your follow-up appointment?: No Do you understand care options if your condition(s) worsen?: Yes-patient verbalized understanding  SDOH Interventions Today    Flowsheet Row Most Recent Value  SDOH Interventions   Transportation Interventions Intervention Not Indicated  Utilities Interventions Intervention Not Indicated      Interventions Today    Flowsheet Row Most Recent Value  Chronic Disease   Chronic disease during today's visit Other  [Depression]  Mental Health Interventions    Mental Health Discussed/Reviewed Mental Health Discussed, Depression, Refer to Social Work for counseling  [To see Biian[facility] at [facility]]       [facility] Interventions Today    Flowsheet Row Most Recent Value  TOC Interventions   TOC Interventions Discussed/Reviewed TOC Interventions Discussed, Arranged PCP follow up within 7 days/Care Guide scheduled       Jodelle Gross, RN, BSN, CCM Care Management Coordinator [facility] Phone: [phone]/Fax: [phone]

## 2023-04-21 ENCOUNTER — Telehealth: Payer: Self-pay | Admitting: *Deleted

## 2023-04-21 NOTE — Progress Notes (Signed)
  Care Coordination   Note   04/21/2023 Name: [name] MRN: [id number] DOB: Jan 19, 1956  Angie Cain is a 67 y.o. year old female who sees Amponsah, Flossie Buffy, MD for primary care. I reached out to [facility] by phone today to offer care coordination services.  Ms. Soberanis was given information about Care Coordination services today including:   The Care Coordination services include support from the care team which includes your Nurse Coordinator, Clinical Social Worker, or Pharmacist.  The Care Coordination team is here to help remove barriers to the health concerns and goals most important to you. Care Coordination services are voluntary, and the patient may decline or stop services at any time by request to their care team member.   Care Coordination Consent Status: Patient agreed to services and verbal consent obtained.   Follow up plan:  Telephone appointment with care coordination team member scheduled for:  04/26/23  Encounter Outcome:  scheduled   [facility] Coordination Care Guide  Direct Dial: [phone]

## 2023-04-26 ENCOUNTER — Ambulatory Visit: Payer: Self-pay | Admitting: Licensed Clinical Social Worker

## 2023-04-26 NOTE — Patient Instructions (Signed)
Visit Information  Thank you for taking time to visit with me today. Please don't hesitate to contact me if I can be of assistance to you.   Following are the goals we discussed today:   Goals Addressed             This Visit's Progress    Patient is experiencing grief issues related to death of her son in 2023/01/17       Interventions: LCSW talked with [name] about client needs Carra said she has grief issues related to death of her son in 2023-01-17.  Gulianna said medical examiner is still trying to determine a cause of death for her son Spoke with client about family support. She has support of her daughter in law; she has support of her brothers Discussed transport needs. She said she has no transport problems. Discussed medication procurement. Discussed sleeping issues. Discussed PCP visits. She said she has appointment tomorrow with PCP.  Provided information about program support with RN, LCSW and Pharmacist. Provided Grief Counseling support for client  Discussed client self care. Discussed relaxation techniques Client also said she sees a Vein Specialist to help with neuropathy issues Client discussed family support; she also discussed her relationship with her spouse. She said she may start looking for new apartment for herself.   Discussed possible grief support for client with [facility] [place], [place]. Client wrote down name of [facility].  Discussed pain issues. She spoke of leg pain issues Encouraged Hayla to call LCSW as needed for SW support at [phone]. Chey was appreciative of call from LCSW today        Our next appointment is by telephone on 05/31/23 at 9:30 AM   Please call the care guide team at [phone] if you need to cancel or reschedule your appointment.   If you are experiencing a Mental Health or Behavioral Health Crisis or need someone to talk to, please go to [facility] [address], [place] [phone])   The patient verbalized understanding of instructions, educational materials, and care plan provided today and DECLINED offer to receive copy of patient instructions, educational materials, and care plan.   The patient has been provided with contact information for the care management team and has been advised to call with any health related questions or concerns.   Kelton Pillar.Quentez Lober MSW, LCSW Licensed Visual merchandiser [facility] Care Management [phone]

## 2023-04-26 NOTE — Patient Outreach (Signed)
Care Coordination   Initial Visit Note   04/26/2023 Name: Angie Cain MRN: [id number] DOB: 25-Feb-1956  Angie Cain is a 67 y.o. year old female who sees Amponsah, Flossie Buffy, MD for primary care. I spoke with  Angie Cain by phone today.  What matters to the patients health and wellness today?  Patient is experiencing grief issues related to death  of her son in 01/06/23    Goals Addressed             This Visit's Progress    Patient is experiencing grief issues related to death of her son in 2023/01/06       Interventions: LCSW talked with Angie Cain about client needs Angie Cain said she has grief issues related to death of her son in Jan 06, 2023.  Angie Cain said medical examiner is still trying to determine a cause of death for her son Spoke with client about family support. She has support of her daughter in law; she has support of her brothers Discussed transport needs. She said she has no transport problems. Discussed medication procurement. Discussed sleeping issues. Discussed PCP visits. She said she has appointment tomorrow with PCP.  Provided information about program support with RN, LCSW and Pharmacist. Provided Grief Counseling support for client  Discussed client self care. Discussed relaxation techniques Client also said she sees a Vein Specialist to help with neuropathy issues Client discussed family support; she also discussed her relationship with her spouse. She said she may start looking for new apartment for herself.   Discussed possible grief support for client with [facility] of [place], [place]. Client wrote down name of [facility].  Discussed pain issues. She spoke of leg pain issues Encouraged Angie Cain to call LCSW as needed for SW support at [phone]. Angie Cain was appreciative of call from LCSW today        SDOH assessments and interventions completed:  Yes  SDOH Interventions Today    Flowsheet Row Most Recent  Value  SDOH Interventions   Depression Interventions/Treatment  Counseling  [experiencing grief symptoms]  Physical Activity Interventions Other (Comments)  [uses cane to help her walk]  Stress Interventions Provide Counseling  [grief over death of her son in Jan 06, 2023 of 2024]        Care Coordination Interventions:  Yes, provided   Interventions Today    Flowsheet Row Most Recent Value  Chronic Disease   Chronic disease during today's visit Other  [spoke with client about client needs]  General Interventions   General Interventions Discussed/Reviewed General Interventions Discussed, [employer]  [discussed program resources]  Exercise Interventions   Exercise Discussed/Reviewed Physical Activity  [uses a cane to help her walk]  Physical Activity Discussed/Reviewed Physical Activity Reviewed  Education Interventions   Education Provided Provided Education  Provided Verbal Education On Community Resources  Mental Health Interventions   Mental Health Discussed/Reviewed Anxiety, Coping Strategies  [client has grief symptoms.Son died unexpectedly in 01/06/2023 . LCSW talked with client about [facility] support and possible grief support with that agency. She said PCP had also given her information about [facility]. Discussed client self care]  Nutrition Interventions   Nutrition Discussed/Reviewed Nutrition Discussed  Pharmacy Interventions   Pharmacy Dicussed/Reviewed Pharmacy Topics Discussed  Safety Interventions   Safety Discussed/Reviewed Fall Risk        Follow up plan: Follow up call scheduled for 05/31/23 at 9:30 AM     Encounter Outcome:  Pt. Visit Completed   Kelton Pillar.Donnie Gedeon MSW, [employer] Licensed  Clinical Social Worker [facility] Care Management [phone]

## 2023-04-27 ENCOUNTER — Other Ambulatory Visit (HOSPITAL_COMMUNITY): Payer: Self-pay

## 2023-04-27 ENCOUNTER — Ambulatory Visit (INDEPENDENT_AMBULATORY_CARE_PROVIDER_SITE_OTHER): Payer: 59 | Admitting: Student

## 2023-04-27 ENCOUNTER — Encounter: Payer: Self-pay | Admitting: Student

## 2023-04-27 ENCOUNTER — Other Ambulatory Visit: Payer: Self-pay

## 2023-04-27 VITALS — BP 107/64 | HR 75 | Temp 98.0°F | Ht 65.0 in | Wt 117.5 lb

## 2023-04-27 DIAGNOSIS — I10 Essential (primary) hypertension: Secondary | ICD-10-CM | POA: Diagnosis not present

## 2023-04-27 DIAGNOSIS — E876 Hypokalemia: Secondary | ICD-10-CM | POA: Diagnosis not present

## 2023-04-27 DIAGNOSIS — Z634 Disappearance and death of family member: Secondary | ICD-10-CM | POA: Diagnosis not present

## 2023-04-27 DIAGNOSIS — E119 Type 2 diabetes mellitus without complications: Secondary | ICD-10-CM

## 2023-04-27 DIAGNOSIS — E1165 Type 2 diabetes mellitus with hyperglycemia: Secondary | ICD-10-CM

## 2023-04-27 DIAGNOSIS — Z7984 Long term (current) use of oral hypoglycemic drugs: Secondary | ICD-10-CM

## 2023-04-27 DIAGNOSIS — Z794 Long term (current) use of insulin: Secondary | ICD-10-CM

## 2023-04-27 LAB — POCT GLYCOSYLATED HEMOGLOBIN (HGB A1C): Hemoglobin A1C: 8 % — AB (ref 4.0–5.6)

## 2023-04-27 LAB — GLUCOSE, CAPILLARY: Glucose-Capillary: 114 mg/dL — ABNORMAL HIGH (ref 70–99)

## 2023-04-27 MED ORDER — TRESIBA FLEXTOUCH 100 UNIT/ML ~~LOC~~ SOPN
22.0000 [IU] | PEN_INJECTOR | Freq: Every day | SUBCUTANEOUS | 3 refills | Status: DC
Start: 2023-04-27 — End: 2023-08-02
  Filled 2023-04-27: qty 15, 68d supply, fill #0

## 2023-04-27 MED ORDER — INSULIN PEN NEEDLE 32G X 4 MM MISC
4 refills | Status: AC
Start: 2023-04-27 — End: ?
  Filled 2023-04-27 (×2): qty 100, 100d supply, fill #0

## 2023-04-27 MED ORDER — TRULICITY 1.5 MG/0.5ML ~~LOC~~ SOAJ
1.5000 mg | SUBCUTANEOUS | 3 refills | Status: DC
Start: 2023-04-27 — End: 2023-08-02
  Filled 2023-04-27: qty 6, 84d supply, fill #0

## 2023-04-27 NOTE — Assessment & Plan Note (Signed)
Patient's son suddenly passed away in 14-Jan-2023 of this year.  Since then she has been going through the stages of grief.  She continues to have fluctuating oral intake, inconsistent medication adherence, and issues with interpersonal relationships.  She is interested in grief counseling.  She has previously been referred for CBT but has missed scheduled appointments.  We will send a referral for grief counseling through [facility] and care coordination.  She is still in the window of expected bereavement but with her history of depression and presentation today she would benefit from continued grief counseling and therapy following.  She is still doing the things she likes to do but less often and denies any thoughts of self-harm. - Grief counseling

## 2023-04-27 NOTE — Patient Instructions (Addendum)
  Thank you, Ms.Damyra Wilbourne, for allowing [place] to provide your care today. Today we discussed . . .  > Bereavement        -I think all of the feelings you are having after the loss of your son are completely normal and will get better.  I am going to send in a referral for grief counseling.  Someone should call you to set this up.  It may be over the phone but I do think any grief counseling will be very beneficial.  If you do not hear from anybody in the next week please do not hesitate to call our office and we can check up on the referral. > Blood Pressure       -We are going to hold your blood pressure medication which is amlodipine-olmesartan also known as Azor.  We will recheck your blood pressure when you come back and restart any medication if needed. > Diabetes       -Your A1c improved to 8.0%. I will not change any of your medications and we will plan to have you come back to recheck your A1c in 3 months.  Please continue to focus on your diet and take your medications consistently.   I have ordered the following labs for you:  Lab Orders         BMP8+Anion Gap         Glucose, capillary         POC Hbg A1C        Referrals ordered today:   Referral Orders  No referral(s) requested today      I have ordered the following medication/changed the following medications:   Stop the following medications: Medications Discontinued During This Encounter  Medication Reason   Dulaglutide (TRULICITY) 1.5 MG/0.5ML SOPN Reorder   insulin degludec (TRESIBA FLEXTOUCH) 100 UNIT/ML FlexTouch Pen Reorder   Insulin Pen Needle 32G X 4 MM MISC Reorder   potassium chloride (KLOR-CON) 10 MEQ tablet    valACYclovir (VALTREX) 1000 MG tablet    timolol (TIMOPTIC) 0.5 % ophthalmic solution    amlodipine-olmesartan (AZOR) 10-20 MG tablet      Start the following medications: Meds ordered this encounter  Medications   insulin degludec (TRESIBA FLEXTOUCH) 100 UNIT/ML FlexTouch Pen    Sig:  Inject 22 Units into the skin daily.    Dispense:  15 mL    Refill:  3   Insulin Pen Needle 32G X 4 MM MISC    Sig: Use to inject insulin once daily at bedtime    Dispense:  100 each    Refill:  4   Dulaglutide (TRULICITY) 1.5 MG/0.5ML SOPN    Sig: Inject 1.5 mg into the skin once a week.    Dispense:  6 mL    Refill:  3      Follow up: 3 months    Remember:     Should you have any questions or concerns please call the internal medicine clinic at [phone].     Rocky Morel, DO [facility]

## 2023-04-27 NOTE — Assessment & Plan Note (Addendum)
A1c today stable at 8.0.  She has not consistently been taking her medications due to either running out or due to issues with her bereavement.  She is agreeable to try taking her medications more consistently.  Blood sugar log shows 3 lows with the lowest at 68.  No noted symptoms during these and were easily reversible by food.  Average fasting blood sugars of 115 with highest being 262. - Continue Tresiba 22 units daily, Trulicity 1.5 mg weekly, and dapagliflozin-metformin 04-999 mg daily

## 2023-04-27 NOTE — Progress Notes (Signed)
Internal Medicine Clinic Attending  Case discussed with Dr. Geraldo Pitter  At the time of the visit.  We reviewed the resident's history and exam and pertinent patient test results.  I agree with the assessment, diagnosis, and plan of care documented in the resident's note.    Patient now has an appointment with [facility] on 6/17 for her grief.   I agree with Dr. Driscilla Grammes plan to STOP all blood pressure medicines for now, as her blood pressure is low today, and she is not 100% sure what she's taking.

## 2023-04-27 NOTE — Progress Notes (Signed)
CC: ED follow-up  HPI:  Angie Cain is a 67 y.o. female with PMH as below who presents to the clinic ED follow-up after being seen on 04/15/2023 for malaise associated with poor oral intake due to depression from bereavement.  Please see assessment and plan for further details.  Past Medical History:  Diagnosis Date   Allergy    Anxiety    occasional   Arthritis    Atherosclerotic peripheral vascular disease with intermittent claudication (HCC)    s/p fem-fem bypass 2011. ABI 07/2012 0.5 R 0.65 L   Diabetes mellitus without complication (HCC)    GERD (gastroesophageal reflux disease)    Hyperlipidemia    Hypertension    IDDM (insulin dependent diabetes mellitus) 07/21/2007   July 2011: Femoral-femoral bypass  ABI 08/11/2012 notable for 0.4-0.59 on right ankle suggestive of severe arterial occlusive disease at rest and 0.60-0.7 on left ankle suggestive of moderate arterial occlusive disease at rest.    Iron deficiency anemia 05/08/2017   Low back pain 12/18/2013   Oral thrush 09/29/2022   Personal history of colonic polyps - adenomas 03/07/2014   Rotator cuff impingement syndrome of left shoulder 03/23/2016   Tobacco abuse    Review of Systems:   Pertinent items noted in HPI and/or A&P.  Physical Exam:  Vitals:   04/27/23 1044 04/27/23 1049 04/27/23 1147  BP: (!) 95/59 96/64 107/64  Pulse: 87 82 75  Temp: 98 F (36.7 C)    TempSrc: Oral    SpO2: 100%    Weight: 117 lb 8 oz (53.3 kg)    Height: 5\' 5"  (1.651 m)      Constitutional: Anxious but well-appearing elderly female. In no acute distress. HEENT: Normocephalic, atraumatic, Sclera non-icteric, PERRL, EOM intact Cardio:Regular rate and rhythm. 2+ bilateral radial pulses. Pulm:Clear to auscultation bilaterally. Normal work of breathing on room air. Abdomen: Soft, non-tender, non-distended, positive bowel sounds. WUJ:[id number] for extremity edema. Skin:Warm and dry. Neuro:Alert and oriented x3. No focal deficit  noted.   Assessment & Plan:   Essential hypertension Patient appears to have some confusion with her blood pressure medications and was recently prescribed losartan and amlodipine separately but is supposed to be on amlodipine-olmesartan combination pill.  Her blood pressure is low today in the office at 107/64.  We will hold her blood pressure medication and reevaluate at next visit.  Type 2 diabetes mellitus (HCC) A1c today stable at 8.0.  She has not consistently been taking her medications due to either running out or due to issues with her bereavement.  She is agreeable to try taking her medications more consistently.  Blood sugar log shows 3 lows with the lowest at 68.  No noted symptoms during these and were easily reversible by food.  Average fasting blood sugars of 115 with highest being 262. - Continue Tresiba 22 units daily, Trulicity 1.5 mg weekly, and dapagliflozin-metformin 04-999 mg daily  Bereavement Patient's son suddenly passed away in January 24, 2023 of this year.  Since then she has been going through the stages of grief.  She continues to have fluctuating oral intake, inconsistent medication adherence, and issues with interpersonal relationships.  She is interested in grief counseling.  She has previously been referred for CBT but has missed scheduled appointments.  We will send a referral for grief counseling through [facility] and care coordination.  She is still in the window of expected bereavement but with her history of depression and presentation today she would benefit from continued grief counseling and therapy  following.  She is still doing the things she likes to do but less often and denies any thoughts of self-harm. - Grief counseling  Hypokalemia Presents today for ED follow-up after being seen on 04/15/2023 with malaise that seem to be associated with poor oral intake as a consequence of her bereavement.  She was found to be hypokalemic and hypomagnesemic.  These were repleted and  she was given short courses of oral repletion afterwards.  Will recheck her electrolytes today.    Patient discussed with Dr. Duwayne Heck, DO Internal [facility] Center Internal Medicine Resident PGY-1 Pager: [phone]

## 2023-04-27 NOTE — Assessment & Plan Note (Signed)
Presents today for ED follow-up after being seen on 04/15/2023 with malaise that seem to be associated with poor oral intake as a consequence of her bereavement.  She was found to be hypokalemic and hypomagnesemic.  These were repleted and she was given short courses of oral repletion afterwards.  Will recheck her electrolytes today.

## 2023-04-27 NOTE — Assessment & Plan Note (Signed)
Patient appears to have some confusion with her blood pressure medications and was recently prescribed losartan and amlodipine separately but is supposed to be on amlodipine-olmesartan combination pill.  Her blood pressure is low today in the office at 107/64.  We will hold her blood pressure medication and reevaluate at next visit.

## 2023-04-28 ENCOUNTER — Other Ambulatory Visit (HOSPITAL_COMMUNITY): Payer: Self-pay

## 2023-04-28 ENCOUNTER — Other Ambulatory Visit: Payer: Self-pay

## 2023-04-28 LAB — BMP8+ANION GAP
Anion Gap: 15 mmol/L (ref 10.0–18.0)
BUN/Creatinine Ratio: 18 (ref 12–28)
BUN: 12 mg/dL (ref 8–27)
CO2: 25 mmol/L (ref 20–29)
Calcium: 9.6 mg/dL (ref 8.7–10.3)
Chloride: 102 mmol/L (ref 96–106)
Creatinine, Ser: 0.68 mg/dL (ref 0.57–1.00)
Glucose: 94 mg/dL (ref 70–99)
Potassium: 3.7 mmol/L (ref 3.5–5.2)
Sodium: 142 mmol/L (ref 134–144)
eGFR: 95 mL/min/{1.73_m2} (ref >59)

## 2023-04-29 ENCOUNTER — Other Ambulatory Visit: Payer: Self-pay

## 2023-04-29 ENCOUNTER — Encounter: Payer: Self-pay | Admitting: *Deleted

## 2023-04-29 ENCOUNTER — Other Ambulatory Visit (HOSPITAL_COMMUNITY): Payer: Self-pay

## 2023-04-29 ENCOUNTER — Other Ambulatory Visit: Payer: Self-pay | Admitting: Student

## 2023-04-29 ENCOUNTER — Ambulatory Visit: Payer: 59 | Admitting: *Deleted

## 2023-04-29 DIAGNOSIS — G8929 Other chronic pain: Secondary | ICD-10-CM

## 2023-04-29 DIAGNOSIS — K219 Gastro-esophageal reflux disease without esophagitis: Secondary | ICD-10-CM

## 2023-04-29 DIAGNOSIS — J309 Allergic rhinitis, unspecified: Secondary | ICD-10-CM

## 2023-04-29 MED ORDER — FAMOTIDINE 20 MG PO TABS
20.0000 mg | ORAL_TABLET | Freq: Every day | ORAL | 1 refills | Status: DC
Start: 2023-04-29 — End: 2024-04-17
  Filled 2023-04-29: qty 30, 30d supply, fill #0

## 2023-04-29 MED ORDER — LIDOCAINE 5 % EX PTCH
1.0000 | MEDICATED_PATCH | CUTANEOUS | 0 refills | Status: DC
Start: 2023-04-29 — End: 2024-04-17
  Filled 2023-04-29: qty 30, 30d supply, fill #0

## 2023-04-29 MED ORDER — MUPIROCIN 2 % EX OINT
1.0000 | TOPICAL_OINTMENT | Freq: Every day | CUTANEOUS | 0 refills | Status: AC
  Filled 2023-04-29: qty 22, 10d supply, fill #0

## 2023-04-29 MED ORDER — CETIRIZINE HCL 10 MG PO TABS
10.0000 mg | ORAL_TABLET | Freq: Every day | ORAL | 0 refills | Status: DC
Start: 2023-04-29 — End: 2024-04-17
  Filled 2023-04-29: qty 7, 7d supply, fill #0

## 2023-04-29 NOTE — Progress Notes (Signed)
Called and spoke to patient about normal BMP.  She was seen in the ED for hypokalemia and this has resolved.

## 2023-04-29 NOTE — Patient Instructions (Signed)
Visit Information  Thank you for taking time to visit with me today. Please don't hesitate to contact me if I can be of assistance to you.   Following are the goals we discussed today:   Goals Addressed               This Visit's Progress     Obtain Grief & Loss Counseling, Supportive Services & Resources. (pt-stated)   On track     Care Coordination Interventions:  Interventions Today    Flowsheet Row Most Recent Value  Chronic Disease   Chronic disease during today's visit Hypertension (HTN), Diabetes, Other  [Major Depression, Bereavement, Complex Grief & Tobacco Use Disorder]  General Interventions   General Interventions Discussed/Reviewed General Interventions Discussed, Labs, Vaccines, Doctor Visits, Health Screening, Annual Foot Exam, General Interventions Reviewed, Annual Eye Exam, Durable Medical Equipment (DME), Community Resources, Level of Care, Communication with  [Communication with Primary Care Provider]  Labs Hgb A1c every 3 months  [Encouraged]  Vaccines COVID-19, Flu, Shingles, RSV, Pneumonia, Tetanus/Pertussis/Diphtheria  [Encouraged]  Doctor Visits Discussed/Reviewed Doctor Visits Discussed, Doctor Visits Reviewed, Annual Wellness Visits, PCP, Specialist  [Encouraged]  Health Screening Bone Density, Colonoscopy, Mammogram  [Encouraged]  Durable Medical Equipment (DME) Glucomoter, BP Cuff, Other  [Cane]  PCP/Specialist Visits Compliance with follow-up visit  [Encouraged]  Communication with PCP/Specialists  [Encouraged]  Exercise Interventions   Exercise Discussed/Reviewed Exercise Discussed, Exercise Reviewed, Physical Activity, Weight Managment, Assistive device use and maintanence  [Encouraged]  Physical Activity Discussed/Reviewed Physical Activity Discussed, Physical Activity Reviewed, Types of exercise, Home Exercise Program (HEP)  [Encouraged]  Weight Management Weight maintenance  [Encouraged]  Education Interventions   Education Provided Provided  Therapist, sports, Provided Web-based Education, Provided Education  Provided Verbal Education On Nutrition, Mental Health/Coping with Illness, When to see the doctor, Foot Care, Eye Care, Labs, Blood Sugar Monitoring, Exercise, Medication, Development worker, community, Community Resources  [Encouraged]  Mental Health Interventions   Mental Health Discussed/Reviewed Mental Health Discussed, Anxiety, Depression, Mental Health Reviewed, Grief and Loss, Substance Abuse, Coping Strategies, Suicide, Other, Crisis  [Domestic Violence]  Nutrition Interventions   Nutrition Discussed/Reviewed Nutrition Discussed, Adding fruits and vegetables, Increaing proteins, Decreasing fats, Decreasing salt, Supplmental nutrition, Decreasing sugar intake, Portion sizes, Carbohydrate meal planning, Nutrition Reviewed, Fluid intake  [Encouraged]  Pharmacy Interventions   Pharmacy Dicussed/Reviewed Pharmacy Topics Discussed, Pharmacy Topics Reviewed, Medication Adherence, Affording Medications  [Encouraged]  Safety Interventions   Safety Discussed/Reviewed Safety Discussed, Safety Reviewed, Fall Risk  [Encouraged]  Advanced Directive Interventions   Advanced Directives Discussed/Reviewed Advanced Directives Discussed  [Encouraged Completion]     Assessed Social Determinant of Health Barriers. Discussed Plans for Ongoing Care Management Follow Up. Provided Careers information officer Information for Care Management Team Members. Screened for Signs & Symptoms of Depression, Related to Chronic Disease State.  PHQ2 & PHQ9 Depression Screen Completed & Results Reviewed.  Suicidal Ideation & Homicidal Ideation Assessed - None Present.   Domestic Violence Assessed - None Present. Access to Weapons Assessed - None Present.   Active Listening & Reflection Utilized.  Verbalization of Feelings Encouraged.  Emotional Support Provided. Feelings of Grief & Loss Validated. Symptoms of Depression Acknowledged. Grief & Loss Resources Reviewed. Grief &  Loss Support Groups Provided. Self-Enrollment in Grief & Loss Support Group of Interest Emphasized. Crisis Support Information, Agencies, Services & Resources Discussed. Problem Solving Interventions Identified. Task-Centered Solutions Implemented.   Solution-Focused Strategies Developed. Acceptance & Commitment Therapy Introduced. Brief Cognitive Behavioral Therapy Initiated. Client-Centered Therapy Enacted. Reviewed Prescription Medications & Discussed  Importance of Compliance. Quality of Sleep Assessed & Sleep Hygiene Techniques Promoted. Emphasized Importance of Quitting Smoking & Offered Smoking Cessation Classes, Services, Agencies & Resources. Encouraged Referral to Psychiatrist for Psychotropic Medication Administration & Management. Encouraged Referral to Therapist for Psychotherapeutic Counseling & Supportive Services. Please Review the Following List of [employer], Mailed on 04/29/2023:  ~ Medicaid Approved Psychiatrists in [place], [place].         ~ Medicaid Approved Therapists in [place], [place].         ~ Therapists & Psychiatrists in [place], [place]. ~ Marriage, Relationship, Individual & Family Counseling Services. ~ Mental Health & Substance Abuse Resources in [place], [place]. ~ Mental Health Resources in [place], [place]. Encouraged Geologist, engineering, Services & Resources of Interest in [place], in An Effort to Personal assistant for Psychotropic Medication Administration & Geneticist, molecular for Psychotherapeutic Counseling & Supportive Services. Encouraged Careers information officer with CSW (# [phone]), if You Have Questions, Need Assistance, or If Additional Social Work Needs Are Identified Between Now & Our Next Scheduled Follow-Up Outreach Call.      Our next appointment is by telephone on 05/03/2023 at 10:30 am.  Please call the care guide team at [phone] if you need to cancel or reschedule  your appointment.   If you are experiencing a Mental Health or Behavioral Health Crisis or need someone to talk to, please call the Suicide and Crisis Lifeline: 988 call the [place] National Suicide Prevention Lifeline: 4341506373 or TTY: 320-800-5061 TTY 6230666369) to talk to a trained counselor call 1-800-273-TALK (toll free, 24 hour hotline) go to [facility] [address], [place] [phone]) call the [facility] Crisis Line: [phone] call 911  Patient verbalizes understanding of instructions and care plan provided today and agrees to view in MyChart. Active MyChart status and patient understanding of how to access instructions and care plan via MyChart confirmed with patient.     Telephone follow up appointment with care management team member scheduled for:  05/03/2023 at 10:30 am.  Danford Bad, BSW, MSW, LCSW  Licensed Clinical Social Worker  [facility]  Mailing [place]. [address], [place], [place] [address] Physical Address-[address], [place], [place] [address] Toll Free Main # [phone] Fax # [phone] Cell # [phone] [email or web address]

## 2023-04-29 NOTE — Patient Outreach (Signed)
Care Coordination   Follow Up Visit Note   04/29/2023  Name: Angie Cain MRN: [id number] DOB: 1956-01-18  Angie Cain is a 67 y.o. year old female who sees Amponsah, Flossie Buffy, MD for primary care. I spoke with [name] by phone today.  What matters to the patients health and wellness today?  Obtain Grief & Loss Counseling, Supportive Services & Resources.   Goals Addressed               This Visit's Progress     Obtain Grief & Loss Counseling, Supportive Services & Resources. (pt-stated)   On track     Care Coordination Interventions:  Interventions Today    Flowsheet Row Most Recent Value  Chronic Disease   Chronic disease during today's visit Hypertension (HTN), Diabetes, Other  [Major Depression, Bereavement, Complex Grief & Tobacco Use Disorder]  General Interventions   General Interventions Discussed/Reviewed General Interventions Discussed, Labs, Vaccines, Doctor Visits, Health Screening, Annual Foot Exam, General Interventions Reviewed, Annual Eye Exam, Durable Medical Equipment (DME), Community Resources, Level of Care, Communication with  [Communication with Primary Care Provider]  Labs Hgb A1c every 3 months  [Encouraged]  Vaccines COVID-19, Flu, Shingles, RSV, Pneumonia, Tetanus/Pertussis/Diphtheria  [Encouraged]  Doctor Visits Discussed/Reviewed Doctor Visits Discussed, Doctor Visits Reviewed, Annual Wellness Visits, PCP, Specialist  [Encouraged]  Health Screening Bone Density, Colonoscopy, Mammogram  [Encouraged]  Durable Medical Equipment (DME) Glucomoter, BP Cuff, Other  [Cane]  PCP/Specialist Visits Compliance with follow-up visit  [Encouraged]  Communication with PCP/Specialists  [Encouraged]  Exercise Interventions   Exercise Discussed/Reviewed Exercise Discussed, Exercise Reviewed, Physical Activity, Weight Managment, Assistive device use and maintanence  [Encouraged]  Physical Activity Discussed/Reviewed Physical Activity Discussed,  Physical Activity Reviewed, Types of exercise, Home Exercise Program (HEP)  [Encouraged]  Weight Management Weight maintenance  [Encouraged]  Education Interventions   Education Provided Provided Therapist, sports, Provided Web-based Education, Provided Education  Provided Verbal Education On Nutrition, Mental Health/Coping with Illness, When to see the doctor, Foot Care, Eye Care, Labs, Blood Sugar Monitoring, Exercise, Medication, Development worker, community, Community Resources  [Encouraged]  Mental Health Interventions   Mental Health Discussed/Reviewed Mental Health Discussed, Anxiety, Depression, Mental Health Reviewed, Grief and Loss, Substance Abuse, Coping Strategies, Suicide, Other, Crisis  [Domestic Violence]  Nutrition Interventions   Nutrition Discussed/Reviewed Nutrition Discussed, Adding fruits and vegetables, Increaing proteins, Decreasing fats, Decreasing salt, Supplmental nutrition, Decreasing sugar intake, Portion sizes, Carbohydrate meal planning, Nutrition Reviewed, Fluid intake  [Encouraged]  Pharmacy Interventions   Pharmacy Dicussed/Reviewed Pharmacy Topics Discussed, Pharmacy Topics Reviewed, Medication Adherence, Affording Medications  [Encouraged]  Safety Interventions   Safety Discussed/Reviewed Safety Discussed, Safety Reviewed, Fall Risk  [Encouraged]  Advanced Directive Interventions   Advanced Directives Discussed/Reviewed Advanced Directives Discussed  [Encouraged Completion]     Assessed Social Determinant of Health Barriers. Discussed Plans for Ongoing Care Management Follow Up. Provided Careers information officer Information for Care Management Team Members. Screened for Signs & Symptoms of Depression, Related to Chronic Disease State.  PHQ2 & PHQ9 Depression Screen Completed & Results Reviewed.  Suicidal Ideation & Homicidal Ideation Assessed - None Present.   Domestic Violence Assessed - None Present. Access to Weapons Assessed - None Present.   Active Listening & Reflection  Utilized.  Verbalization of Feelings Encouraged.  Emotional Support Provided. Feelings of Grief & Loss Validated. Symptoms of Depression Acknowledged. Grief & Loss Resources Reviewed. Grief & Loss Support Groups Provided. Self-Enrollment in Grief & Loss Support Group of Interest Emphasized. Crisis Support Information, Agencies, Services & Resources Discussed.  Problem Solving Interventions Identified. Task-Centered Solutions Implemented.   Solution-Focused Strategies Developed. Acceptance & Commitment Therapy Introduced. Brief Cognitive Behavioral Therapy Initiated. Client-Centered Therapy Enacted. Reviewed Prescription Medications & Discussed Importance of Compliance. Quality of Sleep Assessed & Sleep Hygiene Techniques Promoted. Emphasized Importance of Quitting Smoking & Offered Smoking Cessation Classes, Services, Agencies & Resources. Encouraged Referral to Psychiatrist for Psychotropic Medication Administration & Management. Encouraged Referral to Therapist for Psychotherapeutic Counseling & Supportive Services. Please Review the Following List of [employer], Mailed on 04/29/2023:  ~ Medicaid Approved Psychiatrists in [place], [place].         ~ Medicaid Approved Therapists in [place], [place].         ~ Therapists & Psychiatrists in [place], [place]. ~ Marriage, Relationship, Individual & Family Counseling Services. ~ Mental Health & Substance Abuse Resources in [place], [place]. ~ Mental Health Resources in [place], [place]. Encouraged Geologist, engineering, Services & Resources of Interest in [place], in An Effort to Personal assistant for Psychotropic Medication Administration & Geneticist, molecular for Psychotherapeutic Counseling & Supportive Services. Encouraged Careers information officer with CSW (# [phone]), if You Have Questions, Need Assistance, or If Additional Social Work Needs Are Identified Between Now & Our  Next Scheduled Follow-Up Outreach Call.        SDOH assessments and interventions completed:  Yes.  SDOH Interventions Today    Flowsheet Row Most Recent Value  SDOH Interventions   Food Insecurity Interventions Intervention Not Indicated  Housing Interventions Intervention Not Indicated  Transportation Interventions Intervention Not Indicated  Utilities Interventions Intervention Not Indicated  Alcohol Usage Interventions Intervention Not Indicated (Score <7)  Depression Interventions/Treatment  Referral to Psychiatry, Medication, Counseling  Financial Strain Interventions Intervention Not Indicated  Physical Activity Interventions Patient Declined  Stress Interventions Offered [employer], Provide Counseling, Other (Comment)  [Provided Counseling Services & Resources]  Social Connections Interventions Intervention Not Indicated     Care Coordination Interventions:  Yes, provided.   Follow up plan: Follow up call scheduled for 05/03/2023 at 10:30 am.  Encounter Outcome:  Pt. Visit Completed.   Danford Bad, BSW, MSW, LCSW  Licensed Restaurant manager, [facility]  Mailing [place] [address], [place], [place] [address] Physical Address-[address], [place], [place] [address] Toll Free Main # [phone] Fax # [phone] Cell # [phone] [email or web address]

## 2023-05-03 ENCOUNTER — Encounter: Payer: Self-pay | Admitting: *Deleted

## 2023-05-03 ENCOUNTER — Other Ambulatory Visit (HOSPITAL_COMMUNITY): Payer: Self-pay

## 2023-05-03 ENCOUNTER — Other Ambulatory Visit: Payer: Self-pay | Admitting: Student

## 2023-05-03 ENCOUNTER — Ambulatory Visit: Payer: Self-pay | Admitting: *Deleted

## 2023-05-03 ENCOUNTER — Telehealth: Payer: Self-pay

## 2023-05-03 DIAGNOSIS — I10 Essential (primary) hypertension: Secondary | ICD-10-CM

## 2023-05-03 NOTE — Telephone Encounter (Signed)
Decision:Approved Angie Cain (Key: [id number]) PA Case ID #: [id number] Need Help? Call us at [phone] Outcome Approved today Request Reference Number: [id number]. LIDOCAINE PAD 5% is approved through 12/14/2023. Your patient may now fill this prescription and it will be covered. Authorization Expiration Date: 12/14/2023 Drug Lidocaine 5% patches ePA cloud logo Form [employer] Medicare Part D Electronic Prior Authorization Form [id number] NCPDP)

## 2023-05-03 NOTE — Patient Outreach (Signed)
Care Coordination   Follow Up Visit Note   05/03/2023  Name: Angie Cain MRN: [id number] DOB: 1956/02/22  Renate Klimek is a 67 y.o. year old female who sees Amponsah, Flossie Buffy, MD for primary care. I spoke with [name] by phone today.  What matters to the patients health and wellness today?  Obtain Grief & Loss Counseling, Supportive Services & Resources.   Goals Addressed               This Visit's Progress     Obtain Grief & Loss Counseling, Supportive Services & Resources. (pt-stated)   On track     Care Coordination Interventions:  Interventions Today    Flowsheet Row Most Recent Value  Chronic Disease   Chronic disease during today's visit Hypertension (HTN), Diabetes, Other  [Major Depression, Bereavement, Complex Grief & Tobacco Use Disorder]  General Interventions   General Interventions Discussed/Reviewed General Interventions Discussed, Labs, Vaccines, Doctor Visits, Health Screening, Annual Foot Exam, General Interventions Reviewed, Annual Eye Exam, Durable Medical Equipment (DME), Community Resources, Level of Care, Communication with  [Communication with Primary Care Provider]  Labs Hgb A1c every 3 months  [Encouraged]  Vaccines COVID-19, Flu, Shingles, RSV, Pneumonia, Tetanus/Pertussis/Diphtheria  [Encouraged]  Doctor Visits Discussed/Reviewed Doctor Visits Discussed, Doctor Visits Reviewed, Annual Wellness Visits, PCP, Specialist  [Encouraged]  Health Screening Bone Density, Colonoscopy, Mammogram  [Encouraged]  Durable Medical Equipment (DME) Glucomoter, BP Cuff, Other  [Cane]  PCP/Specialist Visits Compliance with follow-up visit  [Encouraged]  Communication with PCP/Specialists  [Encouraged]  Exercise Interventions   Exercise Discussed/Reviewed Exercise Discussed, Exercise Reviewed, Physical Activity, Weight Managment, Assistive device use and maintanence  [Encouraged]  Physical Activity Discussed/Reviewed Physical Activity Discussed,  Physical Activity Reviewed, Types of exercise, Home Exercise Program (HEP)  [Encouraged]  Weight Management Weight maintenance  [Encouraged]  Education Interventions   Education Provided Provided Therapist, sports, Provided Web-based Education, Provided Education  Provided Verbal Education On Nutrition, Mental Health/Coping with Illness, When to see the doctor, Foot Care, Eye Care, Labs, Blood Sugar Monitoring, Exercise, Medication, Development worker, community, Community Resources  [Encouraged]  Mental Health Interventions   Mental Health Discussed/Reviewed Mental Health Discussed, Anxiety, Depression, Mental Health Reviewed, Grief and Loss, Substance Abuse, Coping Strategies, Suicide, Other, Crisis  [Domestic Violence]  Nutrition Interventions   Nutrition Discussed/Reviewed Nutrition Discussed, Adding fruits and vegetables, Increaing proteins, Decreasing fats, Decreasing salt, Supplmental nutrition, Decreasing sugar intake, Portion sizes, Carbohydrate meal planning, Nutrition Reviewed, Fluid intake  [Encouraged]  Pharmacy Interventions   Pharmacy Dicussed/Reviewed Pharmacy Topics Discussed, Pharmacy Topics Reviewed, Medication Adherence, Affording Medications  [Encouraged]  Safety Interventions   Safety Discussed/Reviewed Safety Discussed, Safety Reviewed, Fall Risk  [Encouraged]  Advanced Directive Interventions   Advanced Directives Discussed/Reviewed Advanced Directives Discussed  [Encouraged Completion]     Active Listening & Reflection Utilized.  Verbalization of Feelings Encouraged.  Emotional Support Provided. Feelings of Grief & Loss Validated. Symptoms of Depression Acknowledged. Grief & Loss Resources Reviewed. Grief & Loss Support Groups Provided. Self-Enrollment in Grief & Loss Support Group of Interest Emphasized. Problem Solving Interventions Activated. Task-Centered Solutions Initiated. Solution-Focused Strategies Implemented. Acceptance & Commitment Therapy Performed. Brief  Cognitive Behavioral Therapy Indicated. Client-Centered Therapy Employed. Offered Smoking Cessation Classes, Services, Agencies & Resources & Emphasized Importance of Quitting Smoking. Confirmed Receipt & Thoroughly Reviewed the Following List of Counseling Agencies, Services & Resources:   ~ [employer] Approved Psychiatrists in [place], [place].           ~ [employer] Approved Therapists in [place], [place].           ~  Therapists & Psychiatrists in [place], [place]. ~ Marriage, Relationship, Individual & Family Counseling Services. ~ Mental Health & Substance Abuse Resources in [place], [place]. ~ Mental Health Resources in [place], [place]. Encouraged Geologist, engineering, Services & Resources of Interest in [place], in An Effort to Personal assistant for Psychotropic Medication Administration & Geneticist, molecular for Psychotherapeutic Counseling & Supportive Services. Please Review Educational Material on "5 Stages of Grief", Mailed on 05/03/2023: ~ Denial - Defense Mechanism that Helps [place] Protect Ourselves from The Shock of The Upsetting [place].      Examples of Denial Include: Refusing to Accept or Acknowledge the Death, Refusing or Avoiding the Topic in       Conversation, Stating the Loss is Not True, or That the Source of The News is Unreliable. ~ Anger - Natural Response Directed Toward Oneself, Family Members, Doctors, God, or Even the Deceased.    Examples of Anger Include: Blaming a Proofreader for Not Preventing an Illness, Blaming Family Members for      Lack of Care or Support, Feeling Anger Toward God or A Higher Spiritual Power, Feeling Angry with Oneself or     Blaming Oneself for The Death, Experiencing a Short Temper or Loss of Patience. ~ Bargaining - Attempts to [employer] or Make Compromises with Ourselves, or A Higher Power, in [place] for     Feeling Less Sad or Having a Different Outcome.     Examples of Bargaining Include: "If  Only I Had Brought Him to The Doctor Sooner", "If I Had Been Around More I     Would Have Noticed Something Was Wrong", "God, If You Bring Him Back, I Promise I Will Never Lie Again." ~ Depression - Feeling of Sadness & Hopelessness That Often Results with The Loss of A Loved One.     Examples of Depression Include: Feelings of Sadness, Loss of Interest in Activities You Normally Enjoy, Changes in     Sleep, Significant Changes in Weight, Lack of Energy, Feeling Agitated or Restless, Feeling Worthless or Guilty,     Decreased Concentration. ~ Acceptance - Period of Grief When We Finally Come to Terms with Accepting the Reality of Our Loss.     Examples of Acceptance Include: No Longer Deny or Struggle Against Our Grief, Focus Our Energy on Celebrating     The Life of Our Loved One, Cherish the Memories that Were Shared & Make Plans for Moving Forward. Encouraged Careers information officer with CSW (# [phone]), if You Have Questions, Need Assistance, or If Additional Social Work Needs Are Identified Between Now & Our Next Scheduled Follow-Up Outreach Call.      SDOH assessments and interventions completed:  Yes.  Care Coordination Interventions:  Yes, provided.   Follow up plan: Follow up call scheduled for 05/12/2023 at 4:00 pm.  Encounter Outcome:  Pt. Visit Completed.   Danford Bad, BSW, MSW, LCSW  Licensed Restaurant manager, fast food [facility]  Mailing [place] [address] [place], [place] [address] Physical Address-[address] [place], [place] [address] Toll Free Main # [phone] Fax # [phone] Cell # [phone] [email or web address]

## 2023-05-03 NOTE — Patient Instructions (Signed)
Visit Information  Thank you for taking time to visit with me today. Please don't hesitate to contact me if I can be of assistance to you.   Following are the goals we discussed today:   Goals Addressed               This Visit's Progress     Obtain Grief & Loss Counseling, Supportive Services & Resources. (pt-stated)   On track     Care Coordination Interventions:  Interventions Today    Flowsheet Row Most Recent Value  Chronic Disease   Chronic disease during today's visit Hypertension (HTN), Diabetes, Other  [Major Depression, Bereavement, Complex Grief & Tobacco Use Disorder]  General Interventions   General Interventions Discussed/Reviewed General Interventions Discussed, Labs, Vaccines, Doctor Visits, Health Screening, Annual Foot Exam, General Interventions Reviewed, Annual Eye Exam, Durable Medical Equipment (DME), Community Resources, Level of Care, Communication with  [Communication with Primary Care Provider]  Labs Hgb A1c every 3 months  [Encouraged]  Vaccines COVID-19, Flu, Shingles, RSV, Pneumonia, Tetanus/Pertussis/Diphtheria  [Encouraged]  Doctor Visits Discussed/Reviewed Doctor Visits Discussed, Doctor Visits Reviewed, Annual Wellness Visits, PCP, Specialist  [Encouraged]  Health Screening Bone Density, Colonoscopy, Mammogram  [Encouraged]  Durable Medical Equipment (DME) Glucomoter, BP Cuff, Other  [Cane]  PCP/Specialist Visits Compliance with follow-up visit  [Encouraged]  Communication with PCP/Specialists  [Encouraged]  Exercise Interventions   Exercise Discussed/Reviewed Exercise Discussed, Exercise Reviewed, Physical Activity, Weight Managment, Assistive device use and maintanence  [Encouraged]  Physical Activity Discussed/Reviewed Physical Activity Discussed, Physical Activity Reviewed, Types of exercise, Home Exercise Program (HEP)  [Encouraged]  Weight Management Weight maintenance  [Encouraged]  Education Interventions   Education Provided Provided  Therapist, sports, Provided Web-based Education, Provided Education  Provided Verbal Education On Nutrition, Mental Health/Coping with Illness, When to see the doctor, Foot Care, Eye Care, Labs, Blood Sugar Monitoring, Exercise, Medication, Development worker, community, Community Resources  [Encouraged]  Mental Health Interventions   Mental Health Discussed/Reviewed Mental Health Discussed, Anxiety, Depression, Mental Health Reviewed, Grief and Loss, Substance Abuse, Coping Strategies, Suicide, Other, Crisis  [Domestic Violence]  Nutrition Interventions   Nutrition Discussed/Reviewed Nutrition Discussed, Adding fruits and vegetables, Increaing proteins, Decreasing fats, Decreasing salt, Supplmental nutrition, Decreasing sugar intake, Portion sizes, Carbohydrate meal planning, Nutrition Reviewed, Fluid intake  [Encouraged]  Pharmacy Interventions   Pharmacy Dicussed/Reviewed Pharmacy Topics Discussed, Pharmacy Topics Reviewed, Medication Adherence, Affording Medications  [Encouraged]  Safety Interventions   Safety Discussed/Reviewed Safety Discussed, Safety Reviewed, Fall Risk  [Encouraged]  Advanced Directive Interventions   Advanced Directives Discussed/Reviewed Advanced Directives Discussed  [Encouraged Completion]     Active Listening & Reflection Utilized.  Verbalization of Feelings Encouraged.  Emotional Support Provided. Feelings of Grief & Loss Validated. Symptoms of Depression Acknowledged. Grief & Loss Resources Reviewed. Grief & Loss Support Groups Provided. Self-Enrollment in Grief & Loss Support Group of Interest Emphasized. Problem Solving Interventions Activated. Task-Centered Solutions Initiated. Solution-Focused Strategies Implemented. Acceptance & Commitment Therapy Performed. Brief Cognitive Behavioral Therapy Indicated. Client-Centered Therapy Employed. Offered Smoking Cessation Classes, Services, Agencies & Resources & Emphasized Importance of Quitting Smoking. Confirmed Receipt &  Thoroughly Reviewed the Following List of Counseling Agencies, Services & Resources:   ~ [employer] Approved Psychiatrists in [place], [place].           ~ [employer] Approved Therapists in [place], [place].           ~ Therapists & Psychiatrists in [place], [place]. ~ Marriage, Relationship, Individual & Family Counseling Services. ~ Mental Health & Substance Abuse  Resources in [place], [place]. ~ Mental Health Resources in [place], [place]. Encouraged Geologist, engineering, Services & Resources of Interest in [place], in An Effort to Personal assistant for Psychotropic Medication Administration & Geneticist, molecular for Psychotherapeutic Counseling & Supportive Services. Please Review Educational Material on "5 Stages of Grief", Mailed on 05/03/2023: ~ Denial - Defense Mechanism that Helps [place] Protect Ourselves from The Shock of The Upsetting [place].      Examples of Denial Include: Refusing to Accept or Acknowledge the Death, Refusing or Avoiding the Topic in       Conversation, Stating the Loss is Not True, or That the Source of The News is Unreliable. ~ Anger - Natural Response Directed Toward Oneself, Family Members, Doctors, God, or Even the Deceased.    Examples of Anger Include: Blaming a Proofreader for Not Preventing an Illness, Blaming Family Members for      Lack of Care or Support, Feeling Anger Toward God or A Higher Spiritual Power, Feeling Angry with Oneself or     Blaming Oneself for The Death, Experiencing a Short Temper or Loss of Patience. ~ Bargaining - Attempts to [employer] or Make Compromises with Ourselves, or A Higher Power, in [place] for     Feeling Less Sad or Having a Different Outcome.     Examples of Bargaining Include: "If Only I Had Brought Him to The Doctor Sooner", "If I Had Been Around More I     Would Have Noticed Something Was Wrong", "God, If You Bring Him Back, I Promise I Will Never Lie Again." ~ Depression - Feeling of  Sadness & Hopelessness That Often Results with The Loss of A Loved One.     Examples of Depression Include: Feelings of Sadness, Loss of Interest in Activities You Normally Enjoy, Changes in     Sleep, Significant Changes in Weight, Lack of Energy, Feeling Agitated or Restless, Feeling Worthless or Guilty,     Decreased Concentration. ~ Acceptance - Period of Grief When We Finally Come to Terms with Accepting the Reality of Our Loss.     Examples of Acceptance Include: No Longer Deny or Struggle Against Our Grief, Focus Our Energy on Celebrating     The Life of Our Loved One, Cherish the Memories that Were Shared & Make Plans for Moving Forward. Encouraged Careers information officer with CSW (# [phone]), if You Have Questions, Need Assistance, or If Additional Social Work Needs Are Identified Between Now & Our Next Scheduled Follow-Up Outreach Call.      Our next appointment is by telephone on 05/12/2023 at 4:00 pm.  Please call the care guide team at [phone] if you need to cancel or reschedule your appointment.   If you are experiencing a Mental Health or Behavioral Health Crisis or need someone to talk to, please call the Suicide and Crisis Lifeline: 988 call the [place] National Suicide Prevention Lifeline: (579) 530-5653 or TTY: 863-626-5495 TTY 8011157439) to talk to a trained counselor call 1-800-273-TALK (toll free, 24 hour hotline) go to [facility] [address], [place] [phone]) call the [facility] Crisis Line: [phone] call 911  Patient verbalizes understanding of instructions and care plan provided today and agrees to view in MyChart. Active MyChart status and patient understanding of how to access instructions and care plan via MyChart confirmed with patient.     Telephone follow up appointment with care management team member scheduled for:  05/12/2023 at 4:00 pm.  Danford Bad,  BSW, MSW, LCSW  Licensed Research scientist (life sciences) [facility]  Mailing [place] [address] [place], [place] [address] Physical Address[address] [place], [place] [address] Toll Free Main # [phone] Fax # [phone] Cell # [phone] [email or web address]

## 2023-05-03 NOTE — Telephone Encounter (Signed)
Prior Authorization for patient (Lidocaine patches) came through on cover my meds was submitted with last office notes awaiting approval or denial 

## 2023-05-04 ENCOUNTER — Other Ambulatory Visit (HOSPITAL_COMMUNITY): Payer: Self-pay

## 2023-05-10 ENCOUNTER — Encounter: Payer: Self-pay | Admitting: *Deleted

## 2023-05-11 ENCOUNTER — Other Ambulatory Visit (HOSPITAL_COMMUNITY): Payer: Self-pay

## 2023-05-12 ENCOUNTER — Ambulatory Visit: Payer: Self-pay | Admitting: *Deleted

## 2023-05-12 ENCOUNTER — Encounter: Payer: Self-pay | Admitting: *Deleted

## 2023-05-12 NOTE — Patient Outreach (Signed)
Care Coordination   Follow Up Visit Note   05/12/2023  Name: Quintasia Rokes MRN: [id number] DOB: October 05, 1956  Laddie Lamere is a 67 y.o. year old female who sees [place], Flossie Buffy, MD for primary care. I spoke with [name] by phone today.  What matters to the patients health and wellness today?   Obtain Grief & Loss Counseling, Supportive Services & Resources.   Goals Addressed               This Visit's Progress     Obtain Grief & Loss Counseling, Supportive Services & Resources. (pt-stated)   On track     Care Coordination Interventions:  Interventions Today    Flowsheet Row Most Recent Value  Chronic Disease   Chronic disease during today's visit Hypertension (HTN), Diabetes, Other  [Major Depression, Bereavement, Complex Grief & Tobacco Use Disorder]  General Interventions   General Interventions Discussed/Reviewed General Interventions Discussed, Labs, Vaccines, Doctor Visits, Health Screening, Annual Foot Exam, General Interventions Reviewed, Annual Eye Exam, Durable Medical Equipment (DME), Community Resources, Level of Care, Communication with  [Communication with Primary Care Provider]  Labs Hgb A1c every 3 months  [Encouraged]  Vaccines COVID-19, Flu, Shingles, RSV, Pneumonia, Tetanus/Pertussis/Diphtheria  [Encouraged]  Doctor Visits Discussed/Reviewed Doctor Visits Discussed, Doctor Visits Reviewed, Annual Wellness Visits, PCP, Specialist  [Encouraged]  Health Screening Bone Density, Colonoscopy, Mammogram  [Encouraged]  Durable Medical Equipment (DME) Glucomoter, BP Cuff, Other  [Cane]  PCP/Specialist Visits Compliance with follow-up visit  [Encouraged]  Communication with PCP/Specialists  [Encouraged]  Exercise Interventions   Exercise Discussed/Reviewed Exercise Discussed, Exercise Reviewed, Physical Activity, Weight Managment, Assistive device use and maintanence  [Encouraged]  Physical Activity Discussed/Reviewed Physical Activity Discussed,  Physical Activity Reviewed, Types of exercise, Home Exercise Program (HEP)  [Encouraged]  Weight Management Weight maintenance  [Encouraged]  Education Interventions   Education Provided Provided Therapist, sports, Provided Web-based Education, Provided Education  Provided Verbal Education On Nutrition, Mental Health/Coping with Illness, When to see the doctor, Foot Care, Eye Care, Labs, Blood Sugar Monitoring, Exercise, Medication, Development worker, community, Community Resources  [Encouraged]  Mental Health Interventions   Mental Health Discussed/Reviewed Mental Health Discussed, Anxiety, Depression, Mental Health Reviewed, Grief and Loss, Substance Abuse, Coping Strategies, Suicide, Other, Crisis  [Domestic Violence]  Nutrition Interventions   Nutrition Discussed/Reviewed Nutrition Discussed, Adding fruits and vegetables, Increaing proteins, Decreasing fats, Decreasing salt, Supplmental nutrition, Decreasing sugar intake, Portion sizes, Carbohydrate meal planning, Nutrition Reviewed, Fluid intake  [Encouraged]  Pharmacy Interventions   Pharmacy Dicussed/Reviewed Pharmacy Topics Discussed, Pharmacy Topics Reviewed, Medication Adherence, Affording Medications  [Encouraged]  Safety Interventions   Safety Discussed/Reviewed Safety Discussed, Safety Reviewed, Fall Risk  [Encouraged]  Advanced Directive Interventions   Advanced Directives Discussed/Reviewed Advanced Directives Discussed  [Encouraged Completion]     Active Listening & Reflection Utilized.  Verbalization of Feelings Encouraged.  Emotional Support Provided. Feelings of Grief & Loss Validated. Symptoms of Depression Acknowledged. Self-Enrollment in Grief & Loss Support Group of Interest Emphasized. Problem Solving Interventions Activated. Task-Centered Solutions Employed. Solution-Focused Strategies Implemented. Acceptance & Commitment Therapy Performed. Cognitive Behavioral Therapy Indicated. Client-Centered Therapy Initiated. Grief &  Loss Counseling Provided. Encouraged Self-Enrollment in Smoking Cessation Classes, Services, Agencies & Resources Provided, in An Effort to Obtain Assistance with Quitting Smoking. Encouraged Emergency planning/management officer, Agencies & Resources Provided, in An Effort to Personal assistant for Psychotropic Medication Administration & Management & Therapist for Psychotherapeutic Counseling & Supportive Services. Confirmed Receipt & Thoroughly Reviewed Educational Material on "5 Stages of Grief", to  Ensure Understanding & Entertain Questions: ~ Denial - Defense Mechanism that Helps [place] Protect Ourselves from The Shock of The [place]psetting [place].   Examples of Denial Include: Refusing to Accept or Acknowledge the Death, Refusing or Avoiding the Topic in Conversation, Stating the Loss is Not True, or That the Source of The News is Unreliable. ~ Anger - Natural Response Directed Toward Oneself, Family Members, Doctors, God, or Even the Deceased. Examples of Anger Include: Blaming a Proofreader for Not Preventing an Illness, Blaming Family Members for Lack of Care or Support, Feeling Anger Toward God or A Higher Spiritual Power, Feeling Angry with Oneself or Blaming Oneself for The Death, Experiencing a Short Temper or Loss of Patience. ~ Bargaining - Attempts to [employer] or Make Compromises with Ourselves, or A Higher Power, in [place] for Feeling Less Sad or Having a Different Outcome.  Examples of Bargaining Include: "If Only I Had Brought Him to The Doctor Sooner", "If I Had Been Around More I Would Have Noticed Something Was Wrong", "God, If You Bring Him Back, I Promise I Will Never Lie Again." ~ Depression - Feeling of Sadness & Hopelessness That Often Results with The Loss of A Loved One.  Examples of Depression Include: Feelings of Sadness, Loss of Interest in Activities You Normally Enjoy, Changes in Sleep, Significant Changes in Weight, Lack of Energy, Feeling Agitated or Restless,  Feeling Worthless or Guilty, Decreased Concentration. ~ Acceptance - Period of Grief When We Finally Come to Terms with Accepting the Reality of Our Loss.  Examples of Acceptance Include: No Longer Deny or Struggle Against Our Grief, Focus Our Energy on Celebrating The Life of Our Loved One, Cherish the Memories that Were Shared & Make Plans for Moving Forward. Encouraged Careers information officer with CSW (# [phone]), if You Have Questions, Need Assistance, or If Additional Social Work Needs Are Identified Between Now & Our Next Scheduled Follow-Up Outreach Call.      SDOH assessments and interventions completed:  Yes.  Care Coordination Interventions:  Yes, provided.   Follow up plan: Follow up call scheduled for 05/24/2023 at 3:15 pm.  Encounter Outcome:  Pt. Visit Completed.   Danford Bad, BSW, MSW, LCSW  Licensed Restaurant manager, fast food [facility]  Mailing [place] [address], [place], [place] [address] Physical Address-[address], [place], [place] [address] Toll Free Main # [phone] Fax # [phone] Cell # [phone] [email or web address]

## 2023-05-12 NOTE — Patient Instructions (Signed)
Visit Information  Thank you for taking time to visit with me today. Please don't hesitate to contact me if I can be of assistance to you.   Following are the goals we discussed today:   Goals Addressed               This Visit's Progress     Obtain Grief & Loss Counseling, Supportive Services & Resources. (pt-stated)   On track     Care Coordination Interventions:  Interventions Today    Flowsheet Row Most Recent Value  Chronic Disease   Chronic disease during today's visit Hypertension (HTN), Diabetes, Other  [Major Depression, Bereavement, Complex Grief & Tobacco Use Disorder]  General Interventions   General Interventions Discussed/Reviewed General Interventions Discussed, Labs, Vaccines, Doctor Visits, Health Screening, Annual Foot Exam, General Interventions Reviewed, Annual Eye Exam, Durable Medical Equipment (DME), Community Resources, Level of Care, Communication with  [Communication with Primary Care Provider]  Labs Hgb A1c every 3 months  [Encouraged]  Vaccines COVID-19, Flu, Shingles, RSV, Pneumonia, Tetanus/Pertussis/Diphtheria  [Encouraged]  Doctor Visits Discussed/Reviewed Doctor Visits Discussed, Doctor Visits Reviewed, Annual Wellness Visits, PCP, Specialist  [Encouraged]  Health Screening Bone Density, Colonoscopy, Mammogram  [Encouraged]  Durable Medical Equipment (DME) Glucomoter, BP Cuff, Other  [Cane]  PCP/Specialist Visits Compliance with follow-up visit  [Encouraged]  Communication with PCP/Specialists  [Encouraged]  Exercise Interventions   Exercise Discussed/Reviewed Exercise Discussed, Exercise Reviewed, Physical Activity, Weight Managment, Assistive device use and maintanence  [Encouraged]  Physical Activity Discussed/Reviewed Physical Activity Discussed, Physical Activity Reviewed, Types of exercise, Home Exercise Program (HEP)  [Encouraged]  Weight Management Weight maintenance  [Encouraged]  Education Interventions   Education Provided Provided  Therapist, sports, Provided Web-based Education, Provided Education  Provided Verbal Education On Nutrition, Mental Health/Coping with Illness, When to see the doctor, Foot Care, Eye Care, Labs, Blood Sugar Monitoring, Exercise, Medication, Development worker, community, Community Resources  [Encouraged]  Mental Health Interventions   Mental Health Discussed/Reviewed Mental Health Discussed, Anxiety, Depression, Mental Health Reviewed, Grief and Loss, Substance Abuse, Coping Strategies, Suicide, Other, Crisis  [Domestic Violence]  Nutrition Interventions   Nutrition Discussed/Reviewed Nutrition Discussed, Adding fruits and vegetables, Increaing proteins, Decreasing fats, Decreasing salt, Supplmental nutrition, Decreasing sugar intake, Portion sizes, Carbohydrate meal planning, Nutrition Reviewed, Fluid intake  [Encouraged]  Pharmacy Interventions   Pharmacy Dicussed/Reviewed Pharmacy Topics Discussed, Pharmacy Topics Reviewed, Medication Adherence, Affording Medications  [Encouraged]  Safety Interventions   Safety Discussed/Reviewed Safety Discussed, Safety Reviewed, Fall Risk  [Encouraged]  Advanced Directive Interventions   Advanced Directives Discussed/Reviewed Advanced Directives Discussed  [Encouraged Completion]     Active Listening & Reflection Utilized.  Verbalization of Feelings Encouraged.  Emotional Support Provided. Feelings of Grief & Loss Validated. Symptoms of Depression Acknowledged. Self-Enrollment in Grief & Loss Support Group of Interest Emphasized. Problem Solving Interventions Activated. Task-Centered Solutions Employed. Solution-Focused Strategies Implemented. Acceptance & Commitment Therapy Performed. Cognitive Behavioral Therapy Indicated. Client-Centered Therapy Initiated. Grief & Loss Counseling Provided. Encouraged Self-Enrollment in Smoking Cessation Classes, Services, Agencies & Resources Provided, in An Effort to Obtain Assistance with Quitting Smoking. Encouraged Therapist, music, Agencies & Resources Provided, in An Effort to Personal assistant for Psychotropic Medication Administration & Management & Therapist for Psychotherapeutic Counseling & Supportive Services. Confirmed Receipt & Thoroughly Reviewed Educational Material on "5 Stages of Grief", to Ensure Understanding & Entertain Questions: ~ Denial - Defense Mechanism that Helps [place] Protect Ourselves from The Shock of The [place]etting [place].   Examples of Denial Include: Refusing to Accept  or Acknowledge the Death, Refusing or Avoiding the Topic in [place], Stating the Loss is Not True, or That the Source of The News is Unreliable. ~ Anger - Natural Response Directed Toward Oneself, Family Members, Doctors, God, or Even the Deceased. Examples of Anger Include: Blaming a Proofreader for Not Preventing an Illness, Blaming Family Members for Lack of Care or Support, Feeling Anger Toward God or A Higher Spiritual Power, Feeling Angry with Oneself or Blaming Oneself for The Death, Experiencing a Short Temper or Loss of Patience. ~ Bargaining - Attempts to [employer] or Make Compromises with Ourselves, or A Higher Power, in [place] for Feeling Less Sad or Having a Different Outcome.  Examples of Bargaining Include: "If Only I Had Brought Him to The Doctor Sooner", "If I Had Been Around More I Would Have Noticed Something Was Wrong", "God, If You Bring Him Back, I Promise I Will Never Lie Again." ~ Depression - Feeling of Sadness & Hopelessness That Often Results with The Loss of A Loved One.  Examples of Depression Include: Feelings of Sadness, Loss of Interest in Activities You Normally Enjoy, Changes in Sleep, Significant Changes in Weight, Lack of Energy, Feeling Agitated or Restless, Feeling Worthless or Guilty, Decreased Concentration. ~ Acceptance - Period of Grief When We Finally Come to Terms with Accepting the Reality of Our Loss.  Examples of Acceptance Include: No Longer Deny or  Struggle Against Our Grief, Focus Our Energy on Celebrating The Life of Our Loved One, Cherish the Memories that Were Shared & Make Plans for Moving Forward. Encouraged Careers information officer with CSW (# [phone]), if You Have Questions, Need Assistance, or If Additional Social Work Needs Are Identified Between Now & Our Next Scheduled Follow-Up Outreach Call.      Our next appointment is by telephone on 05/24/2023 at 3:15 pm.  Please call the care guide team at [phone] if you need to cancel or reschedule your appointment.   If you are experiencing a Mental Health or Behavioral Health Crisis or need someone to talk to, please call the Suicide and Crisis Lifeline: 988 call the [place] National Suicide Prevention Lifeline: 601-820-6937 or TTY: (661)500-8961 TTY (289) 019-4712) to talk to a trained counselor call 1-800-273-TALK (toll free, 24 hour hotline) go to [facility] [address] [place] [phone]) call the [facility] Crisis Line: 713-014-0299 call 911  Patient verbalizes understanding of instructions and care plan provided today and agrees to view in MyChart. Active MyChart status and patient understanding of how to access instructions and care plan via MyChart confirmed with patient.     Telephone follow up appointment with care management team member scheduled for:  05/24/2023 at 3:15 pm.  Danford Bad, BSW, MSW, LCSW  Licensed Clinical Social Worker  [employer] [facility] [facility]  Mailing [place]. [address] [place], [place] [address] Physical Address[address], [place], [place] [address] Toll Free Main # [phone] Fax # [phone] Cell # [phone] [email or web address]

## 2023-05-19 ENCOUNTER — Other Ambulatory Visit (HOSPITAL_COMMUNITY): Payer: Self-pay

## 2023-05-21 ENCOUNTER — Other Ambulatory Visit: Payer: Self-pay | Admitting: *Deleted

## 2023-05-21 DIAGNOSIS — I70213 Atherosclerosis of native arteries of extremities with intermittent claudication, bilateral legs: Secondary | ICD-10-CM

## 2023-05-24 ENCOUNTER — Ambulatory Visit: Payer: Self-pay | Admitting: *Deleted

## 2023-05-24 ENCOUNTER — Encounter: Payer: Self-pay | Admitting: *Deleted

## 2023-05-24 ENCOUNTER — Other Ambulatory Visit: Payer: Self-pay | Admitting: Student

## 2023-05-24 DIAGNOSIS — E1142 Type 2 diabetes mellitus with diabetic polyneuropathy: Secondary | ICD-10-CM

## 2023-05-24 NOTE — Patient Instructions (Signed)
Visit Information  Thank you for taking time to visit with me today. Please don't hesitate to contact me if I can be of assistance to you.   Following are the goals we discussed today:   Goals Addressed               This Visit's Progress     Obtain Grief & Loss Counseling, Supportive Services & Resources. (pt-stated)   On track     Care Coordination Interventions:  Interventions Today    Flowsheet Row Most Recent Value  Chronic Disease   Chronic disease during today's visit Hypertension (HTN), Diabetes, Other  [Major Depression, Bereavement, Complex Grief & Tobacco Use Disorder]  General Interventions   General Interventions Discussed/Reviewed General Interventions Discussed, Labs, Vaccines, Doctor Visits, Health Screening, Annual Foot Exam, General Interventions Reviewed, Annual Eye Exam, Durable Medical Equipment (DME), Community Resources, Level of Care, Communication with  [Communication with Primary Care Provider]  Labs Hgb A1c every 3 months  [Encouraged]  Vaccines COVID-19, Flu, Shingles, RSV, Pneumonia, Tetanus/Pertussis/Diphtheria  [Encouraged]  Doctor Visits Discussed/Reviewed Doctor Visits Discussed, Doctor Visits Reviewed, Annual Wellness Visits, PCP, Specialist  [Encouraged]  Health Screening Bone Density, Colonoscopy, Mammogram  [Encouraged]  Durable Medical Equipment (DME) Glucomoter, BP Cuff, Other  [Cane]  PCP/Specialist Visits Compliance with follow-up visit  [Encouraged]  Communication with PCP/Specialists  [Encouraged]  Exercise Interventions   Exercise Discussed/Reviewed Exercise Discussed, Exercise Reviewed, Physical Activity, Weight Managment, Assistive device use and maintanence  [Encouraged]  Physical Activity Discussed/Reviewed Physical Activity Discussed, Physical Activity Reviewed, Types of exercise, Home Exercise Program (HEP)  [Encouraged]  Weight Management Weight maintenance  [Encouraged]  Education Interventions   Education Provided Provided  Therapist, sports, Provided Web-based Education, Provided Education  Provided Verbal Education On Nutrition, Mental Health/Coping with Illness, When to see the doctor, Foot Care, Eye Care, Labs, Blood Sugar Monitoring, Exercise, Medication, Development worker, community, Community Resources  [Encouraged]  Mental Health Interventions   Mental Health Discussed/Reviewed Mental Health Discussed, Anxiety, Depression, Mental Health Reviewed, Grief and Loss, Substance Abuse, Coping Strategies, Suicide, Other, Crisis  [Domestic Violence]  Nutrition Interventions   Nutrition Discussed/Reviewed Nutrition Discussed, Adding fruits and vegetables, Increaing proteins, Decreasing fats, Decreasing salt, Supplmental nutrition, Decreasing sugar intake, Portion sizes, Carbohydrate meal planning, Nutrition Reviewed, Fluid intake  [Encouraged]  Pharmacy Interventions   Pharmacy Dicussed/Reviewed Pharmacy Topics Discussed, Pharmacy Topics Reviewed, Medication Adherence, Affording Medications  [Encouraged]  Safety Interventions   Safety Discussed/Reviewed Safety Discussed, Safety Reviewed, Fall Risk  [Encouraged]  Advanced Directive Interventions   Advanced Directives Discussed/Reviewed Advanced Directives Discussed  [Encouraged Completion]     Active Listening & Reflection Utilized.  Verbalization of Feelings Encouraged.  Emotional Support Provided. Feelings of Grief & Loss Validated. Symptoms of Depression Acknowledged. Problem Solving Interventions Activated. Task-Centered Solutions Employed. Solution-Focused Strategies Implemented. Acceptance & Commitment Therapy Performed. Cognitive Behavioral Therapy Indicated. Client-Centered Therapy Initiated. Grief & Loss Counseling Resources Reviewed. Grief & Loss Counseling Provided. Grief & Loss Support Groups Revisited. Self-Enrollment in Grief & Loss Support Group of Interest Emphasized. Deep Breathing Exercises, Relaxation Techniques & Mindfulness Meditation Strategies Taught  & Implementation Encouraged Daily. Reviewed Smoking Cessation Classes, Services, Agencies & Resources & Encouraged Self-Enrollment, in An Effort to Obtain Assistance with Quitting Smoking. Encouraged Emergency planning/management officer, Agencies & Resources of Interest, in An Effort to Obtain a Psychiatrist for Psychotropic Medication Administration & Management & a Paramedic for Psychotherapeutic Counseling & Supportive Services. Continued Review of Educational Material on "The 5 Stages of Grief - Denial, Anger, Bargaining,  Depression & Acceptance", to Ensure Understanding & Entertain Questions. Encouraged Careers information officer with CSW (# [phone]), if You Have Questions, Need Assistance, or If Additional Social Work Needs Are Identified Between Now & Our Next Scheduled Follow-Up Outreach Call.      Our next appointment is by telephone on 06/07/2023 at 2:00 pm.   Please call the care guide team at [phone] if you need to cancel or reschedule your appointment.   If you are experiencing a Mental Health or Behavioral Health Crisis or need someone to talk to, please call the Suicide and Crisis Lifeline: 988 call the [place] National Suicide Prevention Lifeline: 810-667-0972 or TTY: (802) 342-4694 TTY 5672565885) to talk to a trained counselor call 1-800-273-TALK (toll free, 24 hour hotline) go to [facility] [address] [place] [phone]) call the [facility] Crisis Line: [phone] call 911  Patient verbalizes understanding of instructions and care plan provided today and agrees to view in MyChart. Active MyChart status and patient understanding of how to access instructions and care plan via MyChart confirmed with patient.     Telephone follow up appointment with care management team member scheduled for:  06/07/2023 at 2:00 pm.   Danford Bad, BSW, MSW, LCSW  Licensed Clinical Social Worker  [facility]  Mailing [place]. [address], [place], [place] [address] Physical Address-[address], [place], [place] [address] Toll Free Main # [phone] Fax # [phone] Cell # [phone] [email or web address]

## 2023-05-24 NOTE — Telephone Encounter (Signed)
Received refill request for Cymbalta (medication not on active list) CMA reached out to patient-states she does not take Cymbalta Refill request denied

## 2023-05-24 NOTE — Patient Outreach (Signed)
Care Coordination   Follow Up Visit Note   05/24/2023  Name: Angie Cain MRN: [id number] DOB: 08/01/1956  Angie Cain is a 67 y.o. year old female who sees [place], Flossie Buffy, MD for primary care. I spoke with Angie Cain by phone today.  What matters to the patients health and wellness today?  Obtain Grief & Loss Counseling, Supportive Services & Resources.   Goals Addressed               This Visit's Progress     Obtain Grief & Loss Counseling, Supportive Services & Resources. (pt-stated)   On track     Care Coordination Interventions:  Interventions Today    Flowsheet Row Most Recent Value  Chronic Disease   Chronic disease during today's visit Hypertension (HTN), Diabetes, Other  [Major Depression, Bereavement, Complex Grief & Tobacco Use Disorder]  General Interventions   General Interventions Discussed/Reviewed General Interventions Discussed, Labs, Vaccines, Doctor Visits, Health Screening, Annual Foot Exam, General Interventions Reviewed, Annual Eye Exam, Durable Medical Equipment (DME), Community Resources, Level of Care, Communication with  [Communication with Primary Care Provider]  Labs Hgb A1c every 3 months  [Encouraged]  Vaccines COVID-19, Flu, Shingles, RSV, Pneumonia, Tetanus/Pertussis/Diphtheria  [Encouraged]  Doctor Visits Discussed/Reviewed Doctor Visits Discussed, Doctor Visits Reviewed, Annual Wellness Visits, PCP, Specialist  [Encouraged]  Health Screening Bone Density, Colonoscopy, Mammogram  [Encouraged]  Durable Medical Equipment (DME) Glucomoter, BP Cuff, Other  [Cane]  PCP/Specialist Visits Compliance with follow-up visit  [Encouraged]  Communication with PCP/Specialists  [Encouraged]  Exercise Interventions   Exercise Discussed/Reviewed Exercise Discussed, Exercise Reviewed, Physical Activity, Weight Managment, Assistive device use and maintanence  [Encouraged]  Physical Activity Discussed/Reviewed Physical Activity Discussed,  Physical Activity Reviewed, Types of exercise, Home Exercise Program (HEP)  [Encouraged]  Weight Management Weight maintenance  [Encouraged]  Education Interventions   Education Provided Provided Therapist, sports, Provided Web-based Education, Provided Education  Provided Verbal Education On Nutrition, Mental Health/Coping with Illness, When to see the doctor, Foot Care, Eye Care, Labs, Blood Sugar Monitoring, Exercise, Medication, Development worker, community, Community Resources  [Encouraged]  Mental Health Interventions   Mental Health Discussed/Reviewed Mental Health Discussed, Anxiety, Depression, Mental Health Reviewed, Grief and Loss, Substance Abuse, Coping Strategies, Suicide, Other, Crisis  [Domestic Violence]  Nutrition Interventions   Nutrition Discussed/Reviewed Nutrition Discussed, Adding fruits and vegetables, Increaing proteins, Decreasing fats, Decreasing salt, Supplmental nutrition, Decreasing sugar intake, Portion sizes, Carbohydrate meal planning, Nutrition Reviewed, Fluid intake  [Encouraged]  Pharmacy Interventions   Pharmacy Dicussed/Reviewed Pharmacy Topics Discussed, Pharmacy Topics Reviewed, Medication Adherence, Affording Medications  [Encouraged]  Safety Interventions   Safety Discussed/Reviewed Safety Discussed, Safety Reviewed, Fall Risk  [Encouraged]  Advanced Directive Interventions   Advanced Directives Discussed/Reviewed Advanced Directives Discussed  [Encouraged Completion]     Active Listening & Reflection Utilized.  Verbalization of Feelings Encouraged.  Emotional Support Provided. Feelings of Grief & Loss Validated. Symptoms of Depression Acknowledged. Problem Solving Interventions Activated. Task-Centered Solutions Employed. Solution-Focused Strategies Implemented. Acceptance & Commitment Therapy Performed. Cognitive Behavioral Therapy Indicated. Client-Centered Therapy Initiated. Grief & Loss Counseling Resources Reviewed. Grief & Loss Counseling  Provided. Grief & Loss Support Groups Revisited. Self-Enrollment in Grief & Loss Support Group of Interest Emphasized. Deep Breathing Exercises, Relaxation Techniques & Mindfulness Meditation Strategies Taught & Implementation Encouraged Daily. Reviewed Smoking Cessation Classes, Services, Agencies & Resources & Encouraged Self-Enrollment, in An Effort to Obtain Assistance with Quitting Smoking. Encouraged Emergency planning/management officer, Agencies & Resources of Interest, in An Effort to Obtain a Therapist, sports  for Psychotropic Medication Administration & Management & a Therapist for Psychotherapeutic Counseling & Supportive Services. Continued Review of Educational Material on "The 5 Stages of Grief - Denial, Anger, Bargaining, Depression & Acceptance", to Ensure Understanding & Entertain Questions. Encouraged Careers information officer with CSW (# [phone]), if You Have Questions, Need Assistance, or If Additional Social Work Needs Are Identified Between Now & Our Next Scheduled Follow-Up Outreach Call.      SDOH assessments and interventions completed:  Yes.  Care Coordination Interventions:  Yes, provided.   Follow up plan: Follow up call scheduled for 06/07/2023 at 2:00 pm.   Encounter Outcome:  Pt. Visit Completed.   Danford Bad, BSW, MSW, LCSW  Licensed Restaurant manager, [facility]  Mailing [place] [address] [place], [place] [address] Physical Address-[address] [place], [place] [address] Toll Free Main # [phone] Fax # [phone] Cell # [phone] [email or web address]

## 2023-05-31 ENCOUNTER — Ambulatory Visit: Payer: Self-pay | Admitting: Licensed Clinical Social Worker

## 2023-05-31 ENCOUNTER — Ambulatory Visit (HOSPITAL_COMMUNITY)
Admission: RE | Admit: 2023-05-31 | Discharge: 2023-05-31 | Disposition: A | Discharge status: Home / Self Care | Payer: 59 | Source: Ambulatory Visit | Attending: Surgery | Admitting: Surgery

## 2023-05-31 ENCOUNTER — Ambulatory Visit (INDEPENDENT_AMBULATORY_CARE_PROVIDER_SITE_OTHER): Payer: 59 | Admitting: Physician Assistant

## 2023-05-31 ENCOUNTER — Ambulatory Visit (INDEPENDENT_AMBULATORY_CARE_PROVIDER_SITE_OTHER)
Admission: RE | Admit: 2023-05-31 | Discharge: 2023-05-31 | Disposition: A | Discharge status: Home / Self Care | Payer: 59 | Source: Ambulatory Visit | Attending: Surgery | Admitting: Surgery

## 2023-05-31 VITALS — BP 158/80 | HR 66 | Temp 98.0°F | Resp 16 | Ht 65.0 in | Wt 118.7 lb

## 2023-05-31 DIAGNOSIS — I70213 Atherosclerosis of native arteries of extremities with intermittent claudication, bilateral legs: Secondary | ICD-10-CM

## 2023-05-31 DIAGNOSIS — I70509 Unspecified atherosclerosis of nonautologous biological bypass graft(s) of the extremities, unspecified extremity: Secondary | ICD-10-CM

## 2023-05-31 LAB — VAS US ABI WITH/WO TBI
Left ABI: 0.55
Right ABI: 0.54

## 2023-05-31 NOTE — Progress Notes (Unsigned)
Office Note     CC:  follow up Requesting Provider:  Steffanie Rainwater, MD  HPI: Angie Cain is a 67 y.o. (21-Oct-1956) female who presents for routine follow up of peripheral artery disease. She has remote history of fem- fem bypass in 2011 by Dr. Darrick Penna. This was for lifestyle limiting claudication. Since her fem fem she has had lower extremity claudication symptoms. This has not been lifestyle limiting. She has no history of rest pain or non healing wounds. 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 She has continued to smoke  Today she reports that she continues to suffer with neuropathy in both her feet and hands. She also continues to have a lot of sciatic nerve pain more so on the RLE. The pain radiates from her hip down the back side of her legs. She also describes tightness pulling in her calf and making her legs feel like they will buckle. She does not really endorse pain on ambulation, or pain in her feet at night. She does not have any tissue loss. She says she has a lot of personal issues going on at this time with recent loss of her son so she is having good and bad days. She tries to stay active though. Says she is able to play with her grand kids. She does continue to smoke several cigarettes per day. She is not always compliant with taking her medications as prescribed.   Past Medical History:  Diagnosis Date   Allergy    Anxiety    occasional   Arthritis    Atherosclerotic peripheral vascular disease with intermittent claudication (HCC)    s/p fem-fem bypass 2011. ABI 07/2012 0.5 R 0.65 L   Diabetes mellitus without complication (HCC)    GERD (gastroesophageal reflux disease)    Hyperlipidemia    Hypertension    IDDM (insulin dependent diabetes mellitus) 07/21/2007   July 2011: Femoral-femoral bypass  ABI 08/11/2012 notable for 0.4-0.59 on right ankle suggestive of severe arterial occlusive disease at rest and 0.60-0.7 on left ankle suggestive  of moderate arterial occlusive disease at rest.    Iron deficiency anemia 05/08/2017   Low back pain 12/18/2013   Oral thrush 09/29/2022   Personal history of colonic polyps - adenomas 03/07/2014   Rotator cuff impingement syndrome of left shoulder 03/23/2016   Tobacco abuse     Past Surgical History:  Procedure Laterality Date   BREAST BIOPSY Left 02/08/2019   clip placement   VAGINAL HYSTERECTOMY     VEIN BYPASS SURGERY  06/13/2010   vein from left to right hip    Social History   Socioeconomic History   Marital status: Married    Spouse name: Alezay Harpold   Number of children: 1   Years of education: 12   Highest education level: 12th grade  Occupational History   Not on file  Tobacco Use   Smoking status: Every Day    Packs/day: .25    Types: Cigarettes    Passive exposure: Current   Smokeless tobacco: Never   Tobacco comments:    Smokes 1 cig/day.    Smoking Cessation Classes, Services, Agencies & Resources Offered.  Vaping Use   Vaping Use: Never used  Substance and Sexual Activity   Alcohol use: Yes    Alcohol/week: 1.0 - 2.0 standard drink of alcohol    Types: 1 - 2 Glasses of wine per week    Comment: weekly   Drug use: No  Sexual activity: Yes    Partners: Male  Other Topics Concern   Not on file  Social History Narrative   Current Social History 11/22/2020        Patient lives with spouse in an apartment which is 1 story. There are no steps up to the entrance.       Patient's method of transportation is personal car.      The highest level of education was high school diploma.      The patient currently disabled.      Identified important Relationships are Husband and sons       Pets : None       Interests / Fun: Arts and crafts, music, reading       Current Stressors: depression       Religious / Personal Beliefs: Christian, non-denominational    Social Determinants of Health   Financial Resource Strain: Low Risk  (04/29/2023)    Overall Financial Resource Strain (CARDIA)    Difficulty of Paying Living Expenses: Not hard at all  Food Insecurity: No Food Insecurity (04/29/2023)   Hunger Vital Sign    Worried About Running Out of Food in the Last Year: Never true    Ran Out of Food in the Last Year: Never true  Transportation Needs: No Transportation Needs (04/29/2023)   PRAPARE - Administrator, Civil Service (Medical): No    Lack of Transportation (Non-Medical): No  Physical Activity: Inactive (05/31/2023)   Exercise Vital Sign    Days of Exercise per Week: 0 days    Minutes of Exercise per Session: 0 min  Stress: Stress Concern Present (05/31/2023)   [employer] of Occupational Health - Occupational Stress Questionnaire    Feeling of Stress : Rather much  Social Connections: Socially Integrated (04/29/2023)   Social Connection and Isolation Panel [NHANES]    Frequency of Communication with Friends and Family: More than three times a week    Frequency of Social Gatherings with Friends and Family: More than three times a week    Attends Religious Services: More than 4 times per year    Active Member of [employer] or Organizations: Yes    Attends Engineer, structural: More than 4 times per year    Marital Status: Married  Catering manager Violence: Not At Risk (04/29/2023)   Humiliation, Afraid, Rape, and Kick questionnaire    Fear of Current or Ex-Partner: No    Emotionally Abused: No    Physically Abused: No    Sexually Abused: No    Family History  Problem Relation Age of Onset   Diabetes Mother    Diabetes Father    Diabetes Sister    Hypertension Sister    Diabetes Brother    Hypertension Brother    Diabetes Son    Diabetes Brother    Diabetes Brother    Diabetes Brother    Diabetes Brother    Diabetes Brother    Colon cancer Neg Hx    Esophageal cancer Neg Hx    Stomach cancer Neg Hx    Rectal cancer Neg Hx    Breast cancer Neg Hx     Current Outpatient Medications   Medication Sig Dispense Refill   Accu-Chek FastClix Lancets MISC CHECK BLOOD SUGAR 3 TIMES A DAY 306 each 1   Alcohol Swabs (B-D SINGLE USE SWABS REGULAR) PADS Use 1 pad 6 (six) times daily. 600 each 1   aspirin EC 81 MG tablet Take 81  mg by mouth daily.     atorvastatin (LIPITOR) 40 MG tablet Take 1 tablet (40 mg total) by mouth at bedtime. 90 tablet 3   Blood Glucose Calibration (ACCU-CHEK AVIVA) SOLN Use to check controls on diabetic glucose meter monthly 1 each 0   Blood Glucose Monitoring Suppl (ACCU-CHEK AVIVA PLUS) w/Device KIT Check blood sugar up to 3 times a day 1 kit 1   brimonidine (ALPHAGAN) 0.2 % ophthalmic solution Place 1 drop into the left eye 2 (two) times daily. 15 mL 3   calcium-vitamin D (OSCAL WITH D) 500-200 MG-UNIT tablet Take 1 tablet by mouth 2 (two) times daily. 60 tablet 3   cetirizine (ZYRTEC ALLERGY) 10 MG tablet Take 1 tablet (10 mg total) by mouth daily. 7 tablet 0   Dapagliflozin Pro-metFORMIN ER (XIGDUO XR) 04-999 MG TB24 Take 1 tablet by mouth daily. 90 tablet 3   diclofenac Sodium (VOLTAREN) 1 % GEL Apply 2 g topically 4 (four) times daily. (Patient taking differently: Apply 2 g topically 4 (four) times daily as needed (for pain).) 100 g 2   Dulaglutide (TRULICITY) 1.5 MG/0.5ML SOPN Inject 1.5 mg into the skin once a week. 6 mL 3   famotidine (PEPCID) 20 MG tablet Take 1 tablet (20 mg total) by mouth at bedtime. 30 tablet 1   glucose blood (ACCU-CHEK GUIDE) test strip Check blood sugar 3 times per day 300 each 1   ibuprofen (ADVIL) 600 MG tablet Take 1 tablet (600 mg total) by mouth every 6 (six) hours as needed. 30 tablet 0   insulin degludec (TRESIBA FLEXTOUCH) 100 UNIT/ML FlexTouch Pen Inject 22 Units into the skin daily. 15 mL 3   Insulin Pen Needle 32G X 4 MM MISC Use to inject insulin once daily at bedtime 100 each 4   latanoprost (XALATAN) 0.005 % ophthalmic solution PLACE 1 DROP INTO BOTH EYES AT BEDTIME. 7.5 mL 0   lidocaine (LIDODERM) 5 % Place 1  patch onto the skin daily. Remove & Discard patch within 12 hours or as directed by MD 30 patch 0   mupirocin ointment (BACTROBAN) 2 % Apply 1 Application topically daily. 22 g 0   No current facility-administered medications for this visit.    Allergies  Allergen Reactions   Bupropion Hives and Swelling   Jardiance [Empagliflozin] Other (See Comments)    Caused a UTI   Metronidazole Itching   Shellfish Allergy Itching and Other (See Comments)    Mouth irritation/itching   Shrimp Extract Itching     REVIEW OF SYSTEMS:  [X]  denotes positive finding, [ ]  denotes negative finding Cardiac  Comments:  Chest pain or chest pressure:    Shortness of breath upon exertion:    Short of breath when lying flat:    Irregular heart rhythm:        Vascular    Pain in calf, thigh, or hip brought on by ambulation:    Pain in feet at night that wakes you up from your sleep:     Blood clot in your veins:    Leg swelling:         Pulmonary    Oxygen at home:    Productive cough:     Wheezing:         Neurologic    Sudden weakness in arms or legs:     Sudden numbness in arms or legs:     Sudden onset of difficulty speaking or slurred speech:    Temporary loss of vision  in one eye:     Problems with dizziness:         Gastrointestinal    Blood in stool:     Vomited blood:         Genitourinary    Burning when urinating:     Blood in urine:        Psychiatric    Major depression:         Hematologic    Bleeding problems:    Problems with blood clotting too easily:        Skin    Rashes or ulcers:        Constitutional    Fever or chills:      PHYSICAL EXAMINATION:  Vitals:   05/31/23 1404  BP: (!) 158/80  Pulse: 66  Resp: 16  Temp: 98 F (36.7 C)  TempSrc: Temporal  SpO2: 100%  Weight: 118 lb 11.2 oz (53.8 kg)  Height: 5\' 5"  (1.651 m)    General:  WDWN in NAD; vital signs documented above Gait: Normal HENT: WNL, normocephalic Pulmonary: normal non-labored  breathing , without wheezing Cardiac: regular HR Abdomen: soft, NT, no masses Vascular Exam/Pulses: palpable pulse in fem-fem bypass, no palpable distal pulses. Feet warm and well perfused Extremities: without ischemic changes, without Gangrene , without cellulitis; without open wounds;  Musculoskeletal: no muscle wasting or atrophy  Neurologic: A&O X 3 Psychiatric:  The pt has Normal affect.   Non-Invasive Vascular Imaging:    +-------+-----------+-----------+------------+------------+  ABI/TBIToday's ABIToday's TBIPrevious ABIPrevious TBI  +-------+-----------+-----------+------------+------------+  Right 0.54       0.23       0.67        0.32          +-------+-----------+-----------+------------+------------+  Left  0.55       0.29       0.74        0.38          +-------+-----------+-----------+------------+------------+   VAS [place] Femoral femoral bypass graft: Right Graft #1: Fem-fem  +------------------+--------+---------------+----------+--------+                   PSV cm/sStenosis       Waveform  Comments  +------------------+--------+---------------+----------+--------+  Inflow           412     75-99% stenosismonophasic          +------------------+--------+---------------+----------+--------+  Prox Anastomosis  263     50-70% stenosismonophasic          +------------------+--------+---------------+----------+--------+  Proximal Graft    61                     monophasic          +------------------+--------+---------------+----------+--------+  Mid Graft         25                     monophasic          +------------------+--------+---------------+----------+--------+  Distal Graft      25                     monophasic          +------------------+--------+---------------+----------+--------+  Distal Anastomosis103                    monophasic           +------------------+--------+---------------+----------+--------+  Outflow          290  50-74% stenosismonophasic          +------------------+--------+---------------+----------+--------+   Summary:  Right: Elevated velocities in the inflow artery suggestive of 75-99% stenosis, 50-70% stenosis noted in the proximal anastomosis and 50-74% stenosis in the outflow artery. Otherwise, bypass appears patent.    ASSESSMENT/PLAN:: 67 y.o. female here for follow up for peripheral artery disease. She has remote history of fem- fem bypass in 2011 by Dr. Darrick Penna. This was for lifestyle limiting claudication. Since her fem fem she has had lower extremity claudication symptoms. This has not been lifestyle limiting. She has no history of rest pain or non healing wounds. 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 Her Duplex today shows elevated velocities in both the inflow and outflow with very low velocities In mid portion of her bypass. She also has had decrease in BLE ABI despite no clear arterial symptoms. I discussed with her my concern for her bypass to be threatened. I have recommended an Angiogram. I have discussed risks, benefits and alternatives with her. She is agreeable to proceed. She has had a lot of recent family issues going on at this time with recent loss of her son so she would like to put this off until the end of July.  - She knows should she have development of sudden pain in her legs, numbness or weakness or if she is unable to palpate pulse in her bypass graft to call us immediately or go to ER.  - encourage her to continue to stay active and work on smoking cessation  - Continue Aspirin and Statin - She would like to schedule this sometime in July. I will have her scheduled with Dr. Myra Gianotti over the next 4-6 weeks at her convenience.   Graceann Congress, PA-C [facility] [phone]  Clinic MD:   Myra Gianotti

## 2023-05-31 NOTE — Patient Instructions (Signed)
Visit Information  Thank you for taking time to visit with me today. Please don't hesitate to contact me if I can be of assistance to you.   Following are the goals we discussed today:   Goals Addressed             This Visit's Progress    Patient has grief issues; she has stress issues faced       Interventions:  Spoke with client about client needs Discussed program support for client with RN, Pharmacist and LCSW Provided counseling support for client Discussed grief issues of client experienced.  Discussed transport issues of client.  Discussed support of PCP.  She discussed her medication procurement.   Discussed her appointment scheduled for  today with vascular specialist Discussed stress in home environment Discussed walking issues of client Discussed sleeping issues of client. She has some difficulty sleeping occasionally Discussed pain issues. Encouraged client to call LCSW as needed for support at [phone]        Our next appointment is by telephone on 07/13/23 at 2:30 PM   Please call the care guide team at [phone] if you need to cancel or reschedule your appointment.   If you are experiencing a Mental Health or Behavioral Health Crisis or need someone to talk to, please go to [facility] [address], [place] [phone])   The patient verbalized understanding of instructions, educational materials, and care plan provided today and DECLINED offer to receive copy of patient instructions, educational materials, and care plan.   The patient has been provided with contact information for the care management team and has been advised to call with any health related questions or concerns.   Kelton Pillar.Diedra Sinor MSW, LCSW Licensed Visual merchandiser [facility] [phone]

## 2023-05-31 NOTE — H&P (View-Only) (Signed)
Office Note     CC:  follow up Requesting Provider:  Steffanie Rainwater, MD  HPI: Angie Cain is a 67 y.o. (06/03/1956) female who presents for routine follow up of peripheral artery disease. She has remote history of fem- fem bypass in 2011 by Dr. Darrick Penna. This was for lifestyle limiting claudication. Since her fem fem she has had lower extremity claudication symptoms. This has not been lifestyle limiting. She has no history of rest pain or non healing wounds. 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 She has continued to smoke  Today she reports that she continues to suffer with neuropathy in both her feet and hands. She also continues to have a lot of sciatic nerve pain more so on the RLE. The pain radiates from her hip down the back side of her legs. She also describes tightness pulling in her calf and making her legs feel like they will buckle. She does not really endorse pain on ambulation, or pain in her feet at night. She does not have any tissue loss. She says she has a lot of personal issues going on at this time with recent loss of her son so she is having good and bad days. She tries to stay active though. Says she is able to play with her grand kids. She does continue to smoke several cigarettes per day. She is not always compliant with taking her medications as prescribed.   Past Medical History:  Diagnosis Date   Allergy    Anxiety    occasional   Arthritis    Atherosclerotic peripheral vascular disease with intermittent claudication (HCC)    s/p fem-fem bypass 2011. ABI 07/2012 0.5 R 0.65 L   Diabetes mellitus without complication (HCC)    GERD (gastroesophageal reflux disease)    Hyperlipidemia    Hypertension    IDDM (insulin dependent diabetes mellitus) 07/21/2007   July 2011: Femoral-femoral bypass  ABI 08/11/2012 notable for 0.4-0.59 on right ankle suggestive of severe arterial occlusive disease at rest and 0.60-0.7 on left ankle suggestive  of moderate arterial occlusive disease at rest.    Iron deficiency anemia 05/08/2017   Low back pain 12/18/2013   Oral thrush 09/29/2022   Personal history of colonic polyps - adenomas 03/07/2014   Rotator cuff impingement syndrome of left shoulder 03/23/2016   Tobacco abuse     Past Surgical History:  Procedure Laterality Date   BREAST BIOPSY Left 02/08/2019   clip placement   VAGINAL HYSTERECTOMY     VEIN BYPASS SURGERY  06/13/2010   vein from left to right hip    Social History   Socioeconomic History   Marital status: Married    Spouse name: Angie Cain   Number of children: 1   Years of education: 12   Highest education level: 12th grade  Occupational History   Not on file  Tobacco Use   Smoking status: Every Day    Packs/day: .25    Types: Cigarettes    Passive exposure: Current   Smokeless tobacco: Never   Tobacco comments:    Smokes 1 cig/day.    Smoking Cessation Classes, Services, Agencies & Resources Offered.  Vaping Use   Vaping Use: Never used  Substance and Sexual Activity   Alcohol use: Yes    Alcohol/week: 1.0 - 2.0 standard drink of alcohol    Types: 1 - 2 Glasses of wine per week    Comment: weekly   Drug use: No  Sexual activity: Yes    Partners: Male  Other Topics Concern   Not on file  Social History Narrative   Current Social History 11/22/2020        Patient lives with spouse in an apartment which is 1 story. There are no steps up to the entrance.       Patient's method of transportation is personal car.      The highest level of education was high school diploma.      The patient currently disabled.      Identified important Relationships are Husband and sons       Pets : None       Interests / Fun: Arts and crafts, music, reading       Current Stressors: depression       Religious / Personal Beliefs: Christian, non-denominational    Social Determinants of Health   Financial Resource Strain: Low Risk  (04/29/2023)    Overall Financial Resource Strain (CARDIA)    Difficulty of Paying Living Expenses: Not hard at all  Food Insecurity: No Food Insecurity (04/29/2023)   Hunger Vital Sign    Worried About Running Out of Food in the Last Year: Never true    Ran Out of Food in the Last Year: Never true  Transportation Needs: No Transportation Needs (04/29/2023)   PRAPARE - Administrator, Civil Service (Medical): No    Lack of Transportation (Non-Medical): No  Physical Activity: Inactive (05/31/2023)   Exercise Vital Sign    Days of Exercise per Week: 0 days    Minutes of Exercise per Session: 0 min  Stress: Stress Concern Present (05/31/2023)   [employer] of Occupational Health - Occupational Stress Questionnaire    Feeling of Stress : Rather much  Social Connections: Socially Integrated (04/29/2023)   Social Connection and Isolation Panel [NHANES]    Frequency of Communication with Friends and Family: More than three times a week    Frequency of Social Gatherings with Friends and Family: More than three times a week    Attends Religious Services: More than 4 times per year    Active Member of [employer] or Organizations: Yes    Attends Engineer, structural: More than 4 times per year    Marital Status: Married  Catering manager Violence: Not At Risk (04/29/2023)   Humiliation, Afraid, Rape, and Kick questionnaire    Fear of Current or Ex-Partner: No    Emotionally Abused: No    Physically Abused: No    Sexually Abused: No    Family History  Problem Relation Age of Onset   Diabetes Mother    Diabetes Father    Diabetes Sister    Hypertension Sister    Diabetes Brother    Hypertension Brother    Diabetes Son    Diabetes Brother    Diabetes Brother    Diabetes Brother    Diabetes Brother    Diabetes Brother    Colon cancer Neg Hx    Esophageal cancer Neg Hx    Stomach cancer Neg Hx    Rectal cancer Neg Hx    Breast cancer Neg Hx     Current Outpatient Medications   Medication Sig Dispense Refill   Accu-Chek FastClix Lancets MISC CHECK BLOOD SUGAR 3 TIMES A DAY 306 each 1   Alcohol Swabs (B-D SINGLE USE SWABS REGULAR) PADS Use 1 pad 6 (six) times daily. 600 each 1   aspirin EC 81 MG tablet Take 81  mg by mouth daily.     atorvastatin (LIPITOR) 40 MG tablet Take 1 tablet (40 mg total) by mouth at bedtime. 90 tablet 3   Blood Glucose Calibration (ACCU-CHEK AVIVA) SOLN Use to check controls on diabetic glucose meter monthly 1 each 0   Blood Glucose Monitoring Suppl (ACCU-CHEK AVIVA PLUS) w/Device KIT Check blood sugar up to 3 times a day 1 kit 1   brimonidine (ALPHAGAN) 0.2 % ophthalmic solution Place 1 drop into the left eye 2 (two) times daily. 15 mL 3   calcium-vitamin D (OSCAL WITH D) 500-200 MG-UNIT tablet Take 1 tablet by mouth 2 (two) times daily. 60 tablet 3   cetirizine (ZYRTEC ALLERGY) 10 MG tablet Take 1 tablet (10 mg total) by mouth daily. 7 tablet 0   Dapagliflozin Pro-metFORMIN ER (XIGDUO XR) 04-999 MG TB24 Take 1 tablet by mouth daily. 90 tablet 3   diclofenac Sodium (VOLTAREN) 1 % GEL Apply 2 g topically 4 (four) times daily. (Patient taking differently: Apply 2 g topically 4 (four) times daily as needed (for pain).) 100 g 2   Dulaglutide (TRULICITY) 1.5 MG/0.5ML SOPN Inject 1.5 mg into the skin once a week. 6 mL 3   famotidine (PEPCID) 20 MG tablet Take 1 tablet (20 mg total) by mouth at bedtime. 30 tablet 1   glucose blood (ACCU-CHEK GUIDE) test strip Check blood sugar 3 times per day 300 each 1   ibuprofen (ADVIL) 600 MG tablet Take 1 tablet (600 mg total) by mouth every 6 (six) hours as needed. 30 tablet 0   insulin degludec (TRESIBA FLEXTOUCH) 100 UNIT/ML FlexTouch Pen Inject 22 Units into the skin daily. 15 mL 3   Insulin Pen Needle 32G X 4 MM MISC Use to inject insulin once daily at bedtime 100 each 4   latanoprost (XALATAN) 0.005 % ophthalmic solution PLACE 1 DROP INTO BOTH EYES AT BEDTIME. 7.5 mL 0   lidocaine (LIDODERM) 5 % Place 1  patch onto the skin daily. Remove & Discard patch within 12 hours or as directed by MD 30 patch 0   mupirocin ointment (BACTROBAN) 2 % Apply 1 Application topically daily. 22 g 0   No current facility-administered medications for this visit.    Allergies  Allergen Reactions   Bupropion Hives and Swelling   Jardiance [Empagliflozin] Other (See Comments)    Caused a UTI   Metronidazole Itching   Shellfish Allergy Itching and Other (See Comments)    Mouth irritation/itching   Shrimp Extract Itching     REVIEW OF SYSTEMS:  [X]  denotes positive finding, [ ]  denotes negative finding Cardiac  Comments:  Chest pain or chest pressure:    Shortness of breath upon exertion:    Short of breath when lying flat:    Irregular heart rhythm:        Vascular    Pain in calf, thigh, or hip brought on by ambulation:    Pain in feet at night that wakes you up from your sleep:     Blood clot in your veins:    Leg swelling:         Pulmonary    Oxygen at home:    Productive cough:     Wheezing:         Neurologic    Sudden weakness in arms or legs:     Sudden numbness in arms or legs:     Sudden onset of difficulty speaking or slurred speech:    Temporary loss of vision  in one eye:     Problems with dizziness:         Gastrointestinal    Blood in stool:     Vomited blood:         Genitourinary    Burning when urinating:     Blood in urine:        Psychiatric    Major depression:         Hematologic    Bleeding problems:    Problems with blood clotting too easily:        Skin    Rashes or ulcers:        Constitutional    Fever or chills:      PHYSICAL EXAMINATION:  Vitals:   05/31/23 1404  BP: (!) 158/80  Pulse: 66  Resp: 16  Temp: 98 F (36.7 C)  TempSrc: Temporal  SpO2: 100%  Weight: 118 lb 11.2 oz (53.8 kg)  Height: 5\' 5"  (1.651 m)    General:  WDWN in NAD; vital signs documented above Gait: Normal HENT: WNL, normocephalic Pulmonary: normal non-labored  breathing , without wheezing Cardiac: regular HR Abdomen: soft, NT, no masses Vascular Exam/Pulses: palpable pulse in fem-fem bypass, no palpable distal pulses. Feet warm and well perfused Extremities: without ischemic changes, without Gangrene , without cellulitis; without open wounds;  Musculoskeletal: no muscle wasting or atrophy  Neurologic: A&O X 3 Psychiatric:  The pt has Normal affect.   Non-Invasive Vascular Imaging:    +-------+-----------+-----------+------------+------------+  ABI/TBIToday's ABIToday's TBIPrevious ABIPrevious TBI  +-------+-----------+-----------+------------+------------+  Right 0.54       0.23       0.67        0.32          +-------+-----------+-----------+------------+------------+  Left  0.55       0.29       0.74        0.38          +-------+-----------+-----------+------------+------------+   VAS [place] Femoral femoral bypass graft: Right Graft #1: Fem-fem  +------------------+--------+---------------+----------+--------+                   PSV cm/sStenosis       Waveform  Comments  +------------------+--------+---------------+----------+--------+  Inflow           412     75-99% stenosismonophasic          +------------------+--------+---------------+----------+--------+  Prox Anastomosis  263     50-70% stenosismonophasic          +------------------+--------+---------------+----------+--------+  Proximal Graft    61                     monophasic          +------------------+--------+---------------+----------+--------+  Mid Graft         25                     monophasic          +------------------+--------+---------------+----------+--------+  Distal Graft      25                     monophasic          +------------------+--------+---------------+----------+--------+  Distal Anastomosis103                    monophasic           +------------------+--------+---------------+----------+--------+  Outflow          290  50-74% stenosismonophasic          +------------------+--------+---------------+----------+--------+   Summary:  Right: Elevated velocities in the inflow artery suggestive of 75-99% stenosis, 50-70% stenosis noted in the proximal anastomosis and 50-74% stenosis in the outflow artery. Otherwise, bypass appears patent.    ASSESSMENT/PLAN:: 67 y.o. female here for follow up for peripheral artery disease. She has remote history of fem- fem bypass in 2011 by Dr. Darrick Penna. This was for lifestyle limiting claudication. Since her fem fem she has had lower extremity claudication symptoms. This has not been lifestyle limiting. She has no history of rest pain or non healing wounds. 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 Her Duplex today shows elevated velocities in both the inflow and outflow with very low velocities In mid portion of her bypass. She also has had decrease in BLE ABI despite no clear arterial symptoms. I discussed with her my concern for her bypass to be threatened. I have recommended an Angiogram. I have discussed risks, benefits and alternatives with her. She is agreeable to proceed. She has had a lot of recent family issues going on at this time with recent loss of her son so she would like to put this off until the end of July.  - She knows should she have development of sudden pain in her legs, numbness or weakness or if she is unable to palpate pulse in her bypass graft to call us immediately or go to ER.  - encourage her to continue to stay active and work on smoking cessation  - Continue Aspirin and Statin - She would like to schedule this sometime in July. I will have her scheduled with Dr. Myra Gianotti over the next 4-6 weeks at her convenience.   Graceann Congress, PA-C [facility] [phone]  Clinic MD:   Myra Gianotti

## 2023-05-31 NOTE — Patient Outreach (Signed)
  Care Coordination   Follow Up Visit Note   05/31/2023 Name: Angie Cain MRN: [id number] DOB: 1956/05/25  Angie Cain is a 67 y.o. year old female who sees Amponsah, Flossie Buffy, MD for primary care. I spoke with  [name] by phone today.  What matters to the patients health and wellness today?  Patient has grief issues, she has stress issues faced.    Goals Addressed             This Visit's Progress    Patient has grief issues; she has stress issues faced       Interventions:  Spoke with client about client needs Discussed program support for client with RN, Pharmacist and LCSW Provided counseling support for client Discussed grief issues of client experienced.  Discussed transport issues of client.  Discussed support of PCP.  She discussed her medication procurement.   Discussed her appointment scheduled for  today with vascular specialist Discussed stress in home environment Discussed walking issues of client Discussed sleeping issues of client. She has some difficulty sleeping occasionally Discussed pain issues. Encouraged client to call LCSW as needed for support at [phone]        SDOH assessments and interventions completed:  Yes  SDOH Interventions Today    Flowsheet Row Most Recent Value  SDOH Interventions   Depression Interventions/Treatment  Counseling  Physical Activity Interventions Other (Comments)  [some walking challeges occasionally]  Stress Interventions Provide Counseling  [stress and anxiety issues related to medical issues, family relation issues]        Care Coordination Interventions:  Yes, provided   Interventions Today    Flowsheet Row Most Recent Value  Chronic Disease   Chronic disease during today's visit Other  [spoke with client about client needs]  General Interventions   General Interventions Discussed/Reviewed General Interventions Discussed, Community Resources  [discussed program support]  Exercise  Interventions   Exercise Discussed/Reviewed Exercise Discussed  [she wants to walk regularly,  but sometimes it is difficult to walk]  Education Interventions   Education Provided Provided Education  Provided Verbal Education On [employer]  Mental Health Interventions   Mental Health Discussed/Reviewed Grief and Loss, Coping Strategies  [managing anxiety and grief issues. trying to use coping skills]  Pharmacy Interventions   Pharmacy Dicussed/Reviewed Pharmacy Topics Discussed  Safety Interventions   Safety Discussed/Reviewed Fall Risk        Follow up plan: Follow up call scheduled for 07/13/23 at 2:30 PM     Encounter Outcome:  Pt. Visit Completed   Kelton Pillar.Janequa Kipnis MSW, LCSW Licensed Visual merchandiser [facility] Care Management [phone]

## 2023-06-01 ENCOUNTER — Encounter: Payer: Self-pay | Admitting: Physician Assistant

## 2023-06-02 ENCOUNTER — Telehealth: Payer: Self-pay

## 2023-06-02 NOTE — Telephone Encounter (Signed)
Attempted to reach patient to schedule aortogram, no answer. Left VM for patient to return call.

## 2023-06-07 ENCOUNTER — Encounter: Payer: Self-pay | Admitting: *Deleted

## 2023-06-07 ENCOUNTER — Other Ambulatory Visit: Payer: Self-pay

## 2023-06-07 ENCOUNTER — Ambulatory Visit: Payer: Self-pay | Admitting: *Deleted

## 2023-06-07 DIAGNOSIS — I70509 Unspecified atherosclerosis of nonautologous biological bypass graft(s) of the extremities, unspecified extremity: Secondary | ICD-10-CM

## 2023-06-07 NOTE — Patient Outreach (Signed)
Care Coordination   Follow Up Visit Note   06/07/2023  Name: Angie Cain MRN: [id number] DOB: December 02, 1956  Angie Cain is a 67 y.o. year old female who sees Amponsah, Flossie Buffy, MD for primary care. I spoke with [name] by phone today.  What matters to the patients health and wellness today?  Obtain Grief & Loss Counseling, Supportive Services & Resources.   Goals Addressed               This Visit's Progress     Obtain Grief & Loss Counseling, Supportive Services & Resources. (pt-stated)   On track     Care Coordination Interventions:  Interventions Today    Flowsheet Row Most Recent Value  Chronic Disease   Chronic disease during today's visit Hypertension (HTN), Diabetes, Other  [Major Depression, Bereavement, Complex Grief & Tobacco Use Disorder]  General Interventions   General Interventions Discussed/Reviewed General Interventions Discussed, Labs, Vaccines, Doctor Visits, Health Screening, Annual Foot Exam, General Interventions Reviewed, Annual Eye Exam, Durable Medical Equipment (DME), Community Resources, Level of Care, Communication with  [Communication with Primary Care Provider]  Labs Hgb A1c every 3 months  [Encouraged]  Vaccines COVID-19, Flu, Shingles, RSV, Pneumonia, Tetanus/Pertussis/Diphtheria  [Encouraged]  Doctor Visits Discussed/Reviewed Doctor Visits Discussed, Doctor Visits Reviewed, Annual Wellness Visits, PCP, Specialist  [Encouraged]  Health Screening Bone Density, Colonoscopy, Mammogram  [Encouraged]  Durable Medical Equipment (DME) Glucomoter, BP Cuff, Other  [Cane]  PCP/Specialist Visits Compliance with follow-up visit  [Encouraged]  Communication with PCP/Specialists  [Encouraged]  Exercise Interventions   Exercise Discussed/Reviewed Exercise Discussed, Exercise Reviewed, Physical Activity, Weight Managment, Assistive device use and maintanence  [Encouraged]  Physical Activity Discussed/Reviewed Physical Activity Discussed,  Physical Activity Reviewed, Types of exercise, Home Exercise Program (HEP)  [Encouraged]  Weight Management Weight maintenance  [Encouraged]  Education Interventions   Education Provided Provided Therapist, sports, Provided Web-based Education, Provided Education  Provided Verbal Education On Nutrition, Mental Health/Coping with Illness, When to see the doctor, Foot Care, Eye Care, Labs, Blood Sugar Monitoring, Exercise, Medication, Development worker, community, Community Resources  [Encouraged]  Mental Health Interventions   Mental Health Discussed/Reviewed Mental Health Discussed, Anxiety, Depression, Mental Health Reviewed, Grief and Loss, Substance Abuse, Coping Strategies, Suicide, Other, Crisis  [Domestic Violence]  Nutrition Interventions   Nutrition Discussed/Reviewed Nutrition Discussed, Adding fruits and vegetables, Increaing proteins, Decreasing fats, Decreasing salt, Supplmental nutrition, Decreasing sugar intake, Portion sizes, Carbohydrate meal planning, Nutrition Reviewed, Fluid intake  [Encouraged]  Pharmacy Interventions   Pharmacy Dicussed/Reviewed Pharmacy Topics Discussed, Pharmacy Topics Reviewed, Medication Adherence, Affording Medications  [Encouraged]  Safety Interventions   Safety Discussed/Reviewed Safety Discussed, Safety Reviewed, Fall Risk  [Encouraged]  Advanced Directive Interventions   Advanced Directives Discussed/Reviewed Advanced Directives Discussed  [Encouraged Completion]     Active Listening & Reflection Utilized.  Verbalization of Feelings Encouraged.  Emotional Support Provided. Feelings of Grief & Loss Validated. Symptoms of Depression Acknowledged. Caregiver Stress Discussed. Caregiver Resources Reviewed. Problem Solving Interventions Activated. Task-Centered Solutions Employed. Solution-Focused Strategies Implemented. Acceptance & Commitment Therapy Performed. Cognitive Behavioral Therapy Indicated. Client-Centered Therapy Initiated. Grief & Loss  Counseling Provided. Reviewed Process for Obtaining Legal Guardianship of Spouse, if Unable to Initiate Advance Directives (Living Will & Healthcare Power of Attorney Documents), Due to Memory Deficits. Reviewed Process for Completing Advance Directives (Living Will & Healthcare Power of Attorney Documents) & Provided Scientist, product/process development. Confirmed Active Insurance Coverage through [employer] Dual Complete (# [phone]) & Reviewed Provider Benefits. Confirmed Active Medicaid Coverage through [facility]  [address] Department of Social Services (# [phone]) & Reviewed Provider Benefits. Encouraged Consideration of Completion of Application for Personal Care Services, through [facility] [phone]) & Provided Personal Care Services Application. Encouraged Review of The Following List of Financial & [employer], Agencies, Resources, Occupational psychologist, Mailed on 06/07/2023: ~ Merchant navy officer (Living Will & Healthcare Power of Corporate treasurer) ~ Personal Care Services Application ~ Personal Care Services Agency Providers ~ [facility] Food Pantries ~ The [employer] Book ~ The [employer] Book  ~ [employer] & Nutrition Program Application ~ [employer] of [employer] ~ Public relations account executive ~ Geographical information systems officer of [employer] ~ Resources for Seniors ~ [employer] ~ Low Income Chief Technology Officer ~ TEFL teacher ~ Engineer, drilling  Encouraged Daily Implementation of Librarian, academic, Brewing technologist, & Mindfulness Meditation Strategies. Encouraged Self-Enrollment in Smoking Cessation Classes, Services, Agencies, & Resources, in An Effort to Obtain Assistance with Quitting Smoking. Encouraged Emergency planning/management officer, Agencies, & Resources of Interest, in An Effort to Obtain a Psychiatrist for Psychotropic  Medication Administration & Management & a Paramedic for Psychotherapeutic Counseling & Supportive Services. Encouraged Careers information officer with CSW (# [phone]), if You Have Questions, Need Assistance, or If Additional Social Work Needs Are Identified Between Now & Our Next Scheduled Follow-Up Outreach Call.      SDOH assessments and interventions completed:  Yes.  Care Coordination Interventions:  Yes, provided.   Follow up plan: Follow up call scheduled for 06/21/2023 at 3:15 pm.  Encounter Outcome:  Pt. Visit Completed.   Danford Bad, BSW, MSW, LCSW  Licensed Restaurant manager, fast food [facility]  [place] [place] [address], [place], [place] [address] Physical Address-[address], [place], [place] [address] Toll Free Main # [phone] Fax # [phone] Cell # [phone] [email or web address]

## 2023-06-07 NOTE — Patient Instructions (Signed)
Visit Information  Thank you for taking time to visit with me today. Please don't hesitate to contact me if I can be of assistance to you.   Following are the goals we discussed today:   Goals Addressed               This Visit's Progress     Obtain Grief & Loss Counseling, Supportive Services & Resources. (pt-stated)   On track     Care Coordination Interventions:  Interventions Today    Flowsheet Row Most Recent Value  Chronic Disease   Chronic disease during today's visit Hypertension (HTN), Diabetes, Other  [Major Depression, Bereavement, Complex Grief & Tobacco Use Disorder]  General Interventions   General Interventions Discussed/Reviewed General Interventions Discussed, Labs, Vaccines, Doctor Visits, Health Screening, Annual Foot Exam, General Interventions Reviewed, Annual Eye Exam, Durable Medical Equipment (DME), Community Resources, Level of Care, Communication with  [Communication with Primary Care Provider]  Labs Hgb A1c every 3 months  [Encouraged]  Vaccines COVID-19, Flu, Shingles, RSV, Pneumonia, Tetanus/Pertussis/Diphtheria  [Encouraged]  Doctor Visits Discussed/Reviewed Doctor Visits Discussed, Doctor Visits Reviewed, Annual Wellness Visits, PCP, Specialist  [Encouraged]  Health Screening Bone Density, Colonoscopy, Mammogram  [Encouraged]  Durable Medical Equipment (DME) Glucomoter, BP Cuff, Other  [Cane]  PCP/Specialist Visits Compliance with follow-up visit  [Encouraged]  Communication with PCP/Specialists  [Encouraged]  Exercise Interventions   Exercise Discussed/Reviewed Exercise Discussed, Exercise Reviewed, Physical Activity, Weight Managment, Assistive device use and maintanence  [Encouraged]  Physical Activity Discussed/Reviewed Physical Activity Discussed, Physical Activity Reviewed, Types of exercise, Home Exercise Program (HEP)  [Encouraged]  Weight Management Weight maintenance  [Encouraged]  Education Interventions   Education Provided Provided  Therapist, sports, Provided Web-based Education, Provided Education  Provided Verbal Education On Nutrition, Mental Health/Coping with Illness, When to see the doctor, Foot Care, Eye Care, Labs, Blood Sugar Monitoring, Exercise, Medication, Development worker, community, Community Resources  [Encouraged]  Mental Health Interventions   Mental Health Discussed/Reviewed Mental Health Discussed, Anxiety, Depression, Mental Health Reviewed, Grief and Loss, Substance Abuse, Coping Strategies, Suicide, Other, Crisis  [Domestic Violence]  Nutrition Interventions   Nutrition Discussed/Reviewed Nutrition Discussed, Adding fruits and vegetables, Increaing proteins, Decreasing fats, Decreasing salt, Supplmental nutrition, Decreasing sugar intake, Portion sizes, Carbohydrate meal planning, Nutrition Reviewed, Fluid intake  [Encouraged]  Pharmacy Interventions   Pharmacy Dicussed/Reviewed Pharmacy Topics Discussed, Pharmacy Topics Reviewed, Medication Adherence, Affording Medications  [Encouraged]  Safety Interventions   Safety Discussed/Reviewed Safety Discussed, Safety Reviewed, Fall Risk  [Encouraged]  Advanced Directive Interventions   Advanced Directives Discussed/Reviewed Advanced Directives Discussed  [Encouraged Completion]     Active Listening & Reflection Utilized.  Verbalization of Feelings Encouraged.  Emotional Support Provided. Feelings of Grief & Loss Validated. Symptoms of Depression Acknowledged. Caregiver Stress Discussed. Caregiver Resources Reviewed. Problem Solving Interventions Activated. Task-Centered Solutions Employed. Solution-Focused Strategies Implemented. Acceptance & Commitment Therapy Performed. Cognitive Behavioral Therapy Indicated. Client-Centered Therapy Initiated. Grief & Loss Counseling Provided. Reviewed Process for Obtaining Legal Guardianship of Spouse, if Unable to Initiate Advance Directives (Living Will & Healthcare Power of Attorney Documents), Due to Memory  Deficits. Reviewed Process for Completing Advance Directives (Living Will & Healthcare Power of Attorney Documents) & Provided Scientist, product/process development. Confirmed Active Insurance Coverage through [employer] Dual Complete (# [phone]) & Reviewed Provider Benefits. Confirmed Active Medicaid Coverage through [facility] Department of Social Services [phone]) & Reviewed Provider Benefits. Encouraged Consideration of Completion of Application for Personal Care Services, through [facility] [phone]) & Provided Personal Care  Services Application. Encouraged Review of The Following List of Financial & [employer], Agencies, Resources, Occupational psychologist, Mailed on 06/07/2023: ~ Merchant navy officer (Living Will & Healthcare Power of Corporate treasurer) ~ Personal Care Services Application ~ Personal Care Services Agency Providers ~ [facility] Food Pantries ~ The [employer] Book ~ The [employer] Book  ~ [employer] & Nutrition Program Application ~ [employer] of [employer] ~ Public relations account executive ~ Geographical information systems officer of [employer] ~ Resources for Seniors ~ [employer] ~ Low Income Chief Technology Officer ~ TEFL teacher ~ Engineer, drilling  Encouraged Daily Implementation of Librarian, academic, Brewing technologist, & Mindfulness Meditation Strategies. Encouraged Self-Enrollment in Smoking Cessation Classes, Services, Agencies, & Resources, in An Effort to Obtain Assistance with Quitting Smoking. Encouraged Emergency planning/management officer, Agencies, & Resources of Interest, in An Effort to Obtain a Psychiatrist for Psychotropic Medication Administration & Management & a Paramedic for Psychotherapeutic Counseling & Supportive Services. Encouraged Careers information officer with CSW (# [phone]), if You Have Questions, Need  Assistance, or If Additional Social Work Needs Are Identified Between Now & Our Next Scheduled Follow-Up Outreach Call.      Our next appointment is by telephone on 06/21/2023 at 3:15 pm.  Please call the care guide team at [phone] if you need to cancel or reschedule your appointment.   If you are experiencing a Mental Health or Behavioral Health Crisis or need someone to talk to, please call the Suicide and Crisis Lifeline: 988 call the [place] National Suicide Prevention Lifeline: 850-041-6020 or TTY: 762-293-2870 TTY 980-163-7465) to talk to a trained counselor call 1-800-273-TALK (toll free, 24 hour hotline) go to [facility] [address], [place] [phone]) call the [facility] Crisis Line: [phone] call 911  Patient verbalizes understanding of instructions and care plan provided today and agrees to view in MyChart. Active MyChart status and patient understanding of how to access instructions and care plan via MyChart confirmed with patient.     Telephone follow up appointment with care management team member scheduled for:  06/21/2023 at 3:15 pm.   Danford Bad, BSW, MSW, LCSW  Licensed Clinical Social Worker  [facility]  Mailing [place]. [address], [place], [place] [address] Physical Address-[address] [place], [place] [address] Toll Free Main # [phone] Fax # [phone] Cell # [phone] [email or web address]

## 2023-06-11 ENCOUNTER — Encounter: Payer: Self-pay | Admitting: Student

## 2023-06-11 NOTE — Progress Notes (Signed)
NO SHOW

## 2023-06-12 ENCOUNTER — Emergency Department (HOSPITAL_COMMUNITY): Payer: 59

## 2023-06-12 ENCOUNTER — Emergency Department (HOSPITAL_COMMUNITY)
Admission: EM | Admit: 2023-06-12 | Discharge: 2023-06-12 | Disposition: A | Discharge status: Home / Self Care | Payer: 59 | Attending: Emergency Medicine | Admitting: Emergency Medicine

## 2023-06-12 ENCOUNTER — Encounter (HOSPITAL_COMMUNITY): Payer: Self-pay

## 2023-06-12 DIAGNOSIS — Z7984 Long term (current) use of oral hypoglycemic drugs: Secondary | ICD-10-CM | POA: Insufficient documentation

## 2023-06-12 DIAGNOSIS — Z794 Long term (current) use of insulin: Secondary | ICD-10-CM | POA: Diagnosis not present

## 2023-06-12 DIAGNOSIS — I10 Essential (primary) hypertension: Secondary | ICD-10-CM | POA: Insufficient documentation

## 2023-06-12 DIAGNOSIS — Z7982 Long term (current) use of aspirin: Secondary | ICD-10-CM | POA: Diagnosis not present

## 2023-06-12 DIAGNOSIS — E876 Hypokalemia: Secondary | ICD-10-CM | POA: Diagnosis not present

## 2023-06-12 DIAGNOSIS — Z79899 Other long term (current) drug therapy: Secondary | ICD-10-CM | POA: Diagnosis not present

## 2023-06-12 DIAGNOSIS — R519 Headache, unspecified: Secondary | ICD-10-CM | POA: Insufficient documentation

## 2023-06-12 DIAGNOSIS — R5383 Other fatigue: Secondary | ICD-10-CM | POA: Diagnosis present

## 2023-06-12 DIAGNOSIS — E119 Type 2 diabetes mellitus without complications: Secondary | ICD-10-CM | POA: Insufficient documentation

## 2023-06-12 LAB — T4, FREE: Free T4: 1.11 ng/dL (ref 0.61–1.12)

## 2023-06-12 LAB — CBC WITH DIFFERENTIAL/PLATELET
Abs Immature Granulocytes: 0.02 10*3/uL (ref 0.00–0.07)
Basophils Absolute: 0 10*3/uL (ref 0.0–0.1)
Basophils Relative: 0 %
Eosinophils Absolute: 0 10*3/uL (ref 0.0–0.5)
Eosinophils Relative: 0 %
HCT: 42.6 % (ref 36.0–46.0)
Hemoglobin: 13.3 g/dL (ref 12.0–15.0)
Immature Granulocytes: 0 %
Lymphocytes Relative: 32 %
Lymphs Abs: 2.2 10*3/uL (ref 0.7–4.0)
MCH: 24.7 pg — ABNORMAL LOW (ref 26.0–34.0)
MCHC: 31.2 g/dL (ref 30.0–36.0)
MCV: 79.2 fL — ABNORMAL LOW (ref 80.0–100.0)
Monocytes Absolute: 0.6 10*3/uL (ref 0.1–1.0)
Monocytes Relative: 9 %
Neutro Abs: 4 10*3/uL (ref 1.7–7.7)
Neutrophils Relative %: 59 %
Platelets: 399 10*3/uL (ref 150–400)
RBC: 5.38 MIL/uL — ABNORMAL HIGH (ref 3.87–5.11)
RDW: 14.2 % (ref 11.5–15.5)
WBC: 7 10*3/uL (ref 4.0–10.5)
nRBC: 0 % (ref 0.0–0.2)

## 2023-06-12 LAB — MAGNESIUM: Magnesium: 1.4 mg/dL — ABNORMAL LOW (ref 1.7–2.4)

## 2023-06-12 LAB — BASIC METABOLIC PANEL
Anion gap: 8 (ref 5–15)
BUN: 13 mg/dL (ref 8–23)
CO2: 29 mmol/L (ref 22–32)
Calcium: 9 mg/dL (ref 8.9–10.3)
Chloride: 102 mmol/L (ref 98–111)
Creatinine, Ser: 0.54 mg/dL (ref 0.44–1.00)
GFR, Estimated: >60 mL/min (ref >60)
Glucose, Bld: 93 mg/dL (ref 70–99)
Potassium: 3 mmol/L — ABNORMAL LOW (ref 3.5–5.1)
Sodium: 139 mmol/L (ref 135–145)

## 2023-06-12 LAB — TROPONIN I (HIGH SENSITIVITY): Troponin I (High Sensitivity): 6 ng/L (ref <18)

## 2023-06-12 LAB — TSH: TSH: 1.154 u[IU]/mL (ref 0.350–4.500)

## 2023-06-12 MED ORDER — MAGNESIUM SULFATE 2 GM/50ML IV SOLN
2.0000 g | Freq: Once | INTRAVENOUS | Status: DC
  Filled 2023-06-12: qty 50

## 2023-06-12 MED ORDER — MAGNESIUM OXIDE -MG SUPPLEMENT 400 (240 MG) MG PO TABS
400.0000 mg | ORAL_TABLET | Freq: Once | ORAL | Status: AC
  Administered 2023-06-12: 400 mg via ORAL
  Filled 2023-06-12: qty 1

## 2023-06-12 MED ORDER — POTASSIUM CHLORIDE 10 MEQ/100ML IV SOLN
10.0000 meq | Freq: Once | INTRAVENOUS | Status: AC
  Administered 2023-06-12: 10 meq via INTRAVENOUS
  Filled 2023-06-12: qty 100

## 2023-06-12 MED ORDER — SODIUM CHLORIDE 0.9 % IV SOLN: INTRAVENOUS | Status: DC

## 2023-06-12 MED ORDER — POTASSIUM CHLORIDE CRYS ER 20 MEQ PO TBCR
40.0000 meq | EXTENDED_RELEASE_TABLET | Freq: Once | ORAL | Status: AC
  Administered 2023-06-12: 40 meq via ORAL
  Filled 2023-06-12: qty 2

## 2023-06-12 MED ORDER — ACETAMINOPHEN 325 MG PO TABS
650.0000 mg | ORAL_TABLET | Freq: Once | ORAL | Status: AC
  Administered 2023-06-12: 650 mg via ORAL
  Filled 2023-06-12: qty 2

## 2023-06-12 MED ORDER — SODIUM CHLORIDE 0.9 % IV BOLUS
1000.0000 mL | Freq: Once | INTRAVENOUS | Status: AC
  Administered 2023-06-12: 1000 mL via INTRAVENOUS

## 2023-06-12 MED ORDER — METOCLOPRAMIDE HCL 5 MG/ML IJ SOLN
10.0000 mg | Freq: Once | INTRAMUSCULAR | Status: AC
  Administered 2023-06-12: 10 mg via INTRAVENOUS
  Filled 2023-06-12: qty 2

## 2023-06-12 MED ORDER — MAGNESIUM OXIDE 400 MG PO TABS: 400.0000 mg | ORAL_TABLET | Freq: Every day | ORAL | 0 refills | Status: AC

## 2023-06-12 MED ORDER — DIPHENHYDRAMINE HCL 50 MG/ML IJ SOLN
12.5000 mg | Freq: Once | INTRAMUSCULAR | Status: AC
  Administered 2023-06-12: 12.5 mg via INTRAVENOUS
  Filled 2023-06-12: qty 1

## 2023-06-12 NOTE — ED Notes (Signed)
Pt provided discharge instructions and prescription information. Pt was given the opportunity to ask questions and questions were answered.   

## 2023-06-12 NOTE — Discharge Instructions (Addendum)
Please follow-up patient with your primary care physician for recheck of your electrolytes.  Your potassium and magnesium was low today.  Take magnesium supplementation for the next few days.  Recommend you engage with your primary care physician regarding your chronic symptoms.

## 2023-06-12 NOTE — ED Provider Notes (Signed)
[place] EMERGENCY DEPARTMENT AT [facility] Provider Note   CSN: [id number] Arrival date & time: 06/12/23  1332     History  No chief complaint on file.   Angie Cain is a 67 y.o. female.  HPI   67 year old female with medical history significant for HTN, GERD, anxiety, HLD, DM 2 who presents to the emergency department with generalized fatigue, chronic headaches.  The patient states that she has felt fatigued and unwell after the death of her son back in 12-24-2022 of this past year.  She has endorsed persistent malaise.  She had previously presented to the emergency department for this in May and was found to be both hypokalemic and hypomagnesemic.  Her electrolytes were replenished and she was subsequently discharged.  She states that she has had intermittent left-sided headaches, no vision changes, nonspecific intermittent chest discomfort described as a burning sensation which she attributes to reflux, no weight loss, does endorse night sweats every night.  She denies any facial droop, numbness.  She denies any SI, HI or AVH.  She states that she has been struggling with grief.  She has seen her PCP monthly for this for the past few months.  Per PCP notes dated 01-May-2023, "patient's son passed away suddenly in 2022-12-24 of this year.  She has been going through the stages of grief.  She continues to have fluctuating oral intake, inconsistent medication adherence, and issues with interpersonal relationships."  Discussions have been ongoing with her PCP regarding CBT and grief counseling.  Home Medications Prior to Admission medications   Medication Sig Start Date End Date Taking? Authorizing Provider  magnesium oxide (MAG-OX) 400 MG tablet Take 1 tablet (400 mg total) by mouth daily for 5 days. 06/12/23 06/17/23 Yes Ernie Avena, MD  Accu-Chek FastClix Lancets MISC CHECK BLOOD SUGAR 3 TIMES A DAY 06/27/21   Evlyn Kanner, MD  Alcohol Swabs (B-D SINGLE USE SWABS REGULAR)  PADS Use 1 pad 6 (six) times daily. 01/19/23   Steffanie Rainwater, MD  aspirin EC 81 MG tablet Take 81 mg by mouth daily.    [provider]  atorvastatin (LIPITOR) 40 MG tablet Take 1 tablet (40 mg total) by mouth at bedtime. 01/19/23   Steffanie Rainwater, MD  Blood Glucose Calibration (ACCU-CHEK AVIVA) SOLN Use to check controls on diabetic glucose meter monthly 02/26/20   Chundi, Sherlyn Lees, MD  Blood Glucose Monitoring Suppl (ACCU-CHEK AVIVA PLUS) w/Device KIT Check blood sugar up to 3 times a day 02/26/20   Lorenso Courier, MD  brimonidine (ALPHAGAN) 0.2 % ophthalmic solution Place 1 drop into the left eye 2 (two) times daily. 12/30/22     calcium-vitamin D (OSCAL WITH D) 500-200 MG-UNIT tablet Take 1 tablet by mouth 2 (two) times daily. 03/17/19   Lorenso Courier, MD  cetirizine (ZYRTEC ALLERGY) 10 MG tablet Take 1 tablet (10 mg total) by mouth daily. 04/29/23   Rocky Morel, DO  Dapagliflozin Pro-metFORMIN ER (XIGDUO XR) 04-999 MG TB24 Take 1 tablet by mouth daily. 01/19/23   Steffanie Rainwater, MD  diclofenac Sodium (VOLTAREN) 1 % GEL Apply 2 g topically 4 (four) times daily. Patient taking differently: Apply 2 g topically 4 (four) times daily as needed (for pain). 06/12/21   Rehman, Areeg N, DO  Dulaglutide (TRULICITY) 1.5 MG/0.5ML SOPN Inject 1.5 mg into the skin once a week. 01-May-2023   Rocky Morel, DO  famotidine (PEPCID) 20 MG tablet Take 1 tablet (20 mg total) by mouth at bedtime. 04/29/23  Rocky Morel, DO  glucose blood (ACCU-CHEK GUIDE) test strip Check blood sugar 3 times per day 07/07/21   Steffanie Rainwater, MD  ibuprofen (ADVIL) 600 MG tablet Take 1 tablet (600 mg total) by mouth every 6 (six) hours as needed. 01/05/23   Ernie Avena, MD  insulin degludec (TRESIBA FLEXTOUCH) 100 UNIT/ML FlexTouch Pen Inject 22 Units into the skin daily. 04/27/23   Rocky Morel, DO  Insulin Pen Needle 32G X 4 MM MISC Use to inject insulin once daily at bedtime 04/27/23   Rocky Morel, DO   latanoprost (XALATAN) 0.005 % ophthalmic solution PLACE 1 DROP INTO BOTH EYES AT BEDTIME. 06/27/21   Evlyn Kanner, MD  lidocaine (LIDODERM) 5 % Place 1 patch onto the skin daily. Remove & Discard patch within 12 hours or as directed by MD 04/29/23   Rocky Morel, DO  mupirocin ointment (BACTROBAN) 2 % Apply 1 Application topically daily. 04/29/23   Rocky Morel, DO      Allergies    Bupropion, Jardiance [empagliflozin], Metronidazole, Shellfish allergy, and Shrimp extract    Review of Systems   Review of Systems  All other systems reviewed and are negative.   Physical Exam Updated Vital Signs BP (!) 143/74   Pulse 72   Temp 97.6 F (36.4 C) (Oral)   Resp 17   SpO2 99%  Physical Exam Vitals and nursing note reviewed.  Constitutional:      General: She is not in acute distress.    Appearance: She is well-developed.  HENT:     Head: Normocephalic and atraumatic.  Eyes:     Conjunctiva/sclera: Conjunctivae normal.  Cardiovascular:     Rate and Rhythm: Normal rate and regular rhythm.     Heart sounds: No murmur heard. Pulmonary:     Effort: Pulmonary effort is normal. No respiratory distress.     Breath sounds: Normal breath sounds.  Abdominal:     Palpations: Abdomen is soft.     Tenderness: There is no abdominal tenderness.  Musculoskeletal:        General: No swelling.     Cervical back: Neck supple.  Skin:    General: Skin is warm and dry.     Capillary Refill: Capillary refill takes less than 2 seconds.  Neurological:     Mental Status: She is alert.     Comments: MENTAL STATUS EXAM:    Orientation: Alert and oriented to person, place and time.  Memory: Cooperative, follows commands well.  Language: Speech is clear and language is normal.   CRANIAL NERVES:    CN 2 (Optic): Visual fields intact to confrontation.  CN 3,4,6 (EOM): Pupils equal and reactive to light. Full extraocular eye movement without nystagmus.  CN 5 (Trigeminal): Facial sensation is  normal, no weakness of masticatory muscles.  CN 7 (Facial): No facial weakness or asymmetry.  CN 8 (Auditory): Auditory acuity grossly normal.  CN 9,10 (Glossophar): The uvula is midline, the palate elevates symmetrically.  CN 11 (spinal access): Normal sternocleidomastoid and trapezius strength.  CN 12 (Hypoglossal): The tongue is midline. No atrophy or fasciculations.Marland Kitchen   MOTOR:  Muscle Strength: 5/5RUE, 5/5LUE, 5/5RLE, 5/5LLE.   COORDINATION:   Intact finger-to-nose, no tremor.   SENSATION:   Intact to light touch all four extremities.   Psychiatric:        Attention and Perception: Attention and perception normal.        Mood and Affect: Affect normal. Mood is depressed.  Speech: Speech normal.        Behavior: Behavior normal. Behavior is cooperative.        Thought Content: Thought content normal. Thought content does not include homicidal or suicidal ideation.     ED Results / Procedures / Treatments   Labs (all labs ordered are listed, but only abnormal results are displayed) Labs Reviewed  CBC WITH DIFFERENTIAL/PLATELET - Abnormal; Notable for the following components:      Result Value   RBC 5.38 (*)    MCV 79.2 (*)    MCH 24.7 (*)    All other components within normal limits  BASIC METABOLIC PANEL - Abnormal; Notable for the following components:   Potassium 3.0 (*)    All other components within normal limits  MAGNESIUM - Abnormal; Notable for the following components:   Magnesium 1.4 (*)    All other components within normal limits  TSH  T4, FREE  TROPONIN I (HIGH SENSITIVITY)    EKG EKG Interpretation Date/Time:  Saturday June 12 2023 16:13:52 EDT Ventricular Rate:  73 PR Interval:  129 QRS Duration:  84 QT Interval:  414 QTC Calculation: 457 R Axis:   -8  Text Interpretation: Sinus rhythm Ventricular premature complex Confirmed by Ernie Avena ([id number]) on 06/12/2023 4:38:59 PM  Radiology CT HEAD WO CONTRAST  Result Date: 06/12/2023 CLINICAL  DATA:  Headache EXAM: CT HEAD WITHOUT CONTRAST TECHNIQUE: Contiguous axial images were obtained from the base of the skull through the vertex without intravenous contrast. RADIATION DOSE REDUCTION: This exam was performed according to the departmental dose-optimization program which includes automated exposure control, adjustment of the mA and/or kV according to patient size and/or use of iterative reconstruction technique. COMPARISON:  CT brain 04/15/2023 FINDINGS: Brain: No acute territorial infarction, hemorrhage or intracranial mass. Atrophy and mild chronic small vessel ischemic changes of the white matter. Stable ventricle size Vascular: No hyperdense vessels.  Carotid vascular calcification Skull: Normal. Negative for fracture or focal lesion. Sinuses/Orbits: No acute finding. Other: None IMPRESSION: 1. No CT evidence for acute intracranial abnormality. 2. Atrophy and mild chronic small vessel ischemic changes of the white matter. Electronically Signed   By: Jasmine Pang M.D.   On: 06/12/2023 16:58   DG Chest Portable 1 View  Result Date: 06/12/2023 CLINICAL DATA:  Night sweats and fatigue EXAM: PORTABLE CHEST 1 VIEW COMPARISON:  Chest x-ray Apr 15, 2023 FINDINGS: The cardiomediastinal silhouette is unchanged in contour. No focal pulmonary opacity. No pleural effusion or pneumothorax. The visualized upper abdomen is unremarkable. No acute osseous abnormality. IMPRESSION: No acute cardiopulmonary abnormality. Electronically Signed   By: Jacob Moores M.D.   On: 06/12/2023 16:40    Procedures Procedures    Medications Ordered in ED Medications  sodium chloride 0.9 % bolus 1,000 mL (0 mLs Intravenous Stopped 06/12/23 1648)    And  0.9 %  sodium chloride infusion (has no administration in time range)  magnesium sulfate IVPB 2 g 50 mL (2 g Intravenous Patient Refused/Not Given 06/12/23 1751)  metoCLOPramide (REGLAN) injection 10 mg (10 mg Intravenous Given 06/12/23 1549)  diphenhydrAMINE  (BENADRYL) injection 12.5 mg (12.5 mg Intravenous Given 06/12/23 1548)  acetaminophen (TYLENOL) tablet 650 mg (650 mg Oral Given 06/12/23 1544)  magnesium oxide (MAG-OX) tablet 400 mg (400 mg Oral Given 06/12/23 1656)  potassium chloride SA (KLOR-CON M) CR tablet 40 mEq (40 mEq Oral Given 06/12/23 1656)  potassium chloride 10 mEq in 100 mL IVPB (0 mEq Intravenous Stopped 06/12/23 1804)  ED Course/ Medical Decision Making/ A&P Clinical Course as of 06/12/23 1846  Sat Jun 12, 2023  1637 Magnesium(!): 1.4 [JL]  1638 Potassium(!): 3.0 [JL]    Clinical Course User Index [JL] Ernie Avena, MD                             Medical Decision Making Amount and/or Complexity of Data Reviewed Labs: ordered. Decision-making details documented in ED Course. Radiology: ordered.  Risk OTC drugs. Prescription drug management.    67 year old female with medical history significant for HTN, GERD, anxiety, HLD, DM 2 who presents to the emergency department with generalized fatigue, chronic headaches.  The patient states that she has felt fatigued and unwell after the death of her son back in 27-Dec-2022 of this past year.  She has endorsed persistent malaise.  She had previously presented to the emergency department for this in May and was found to be both hypokalemic and hypomagnesemic.  Her electrolytes were replenished and she was subsequently discharged.  She states that she has had intermittent left-sided headaches, no vision changes, nonspecific intermittent chest discomfort described as a burning sensation which she attributes to reflux, no weight loss, does endorse night sweats every night.  She denies any facial droop, numbness.  She denies any SI, HI or AVH.  She states that she has been struggling with grief.  She has seen her PCP monthly for this for the past few months.  Per PCP notes dated 05-04-23, "patient's son passed away suddenly in 12/27/22 of this year.  She has been going through the stages of  grief.  She continues to have fluctuating oral intake, inconsistent medication adherence, and issues with interpersonal relationships."  Discussions have been ongoing with her PCP regarding CBT and grief counseling.  On arrival, the patient was vitally stable, afebrile, not tachycardic or tachypneic, normotensive, saturating 100% on room air.  Physical exam significant for normal neurologic exam, lungs CTAB, abdomen soft, nontender, nondistended, no rebound or guarding.  Patient presenting with several months of fatigue.  She has a history of low magnesium and potassium.  Considered repeat electrolyte abnormality, considered thyroid dysfunction, considered depression.  The patient denies any SI, HI or AVH.  Given the patient's chronic headaches, considered intracranial mass.  CT of the head was performed which revealed chronic microvascular ischemic changes, no acute abnormality: IMPRESSION:  1. No CT evidence for acute intracranial abnormality.  2. Atrophy and mild chronic small vessel ischemic changes of the  white matter.    X-ray imaging was negative for lung mass no other acute intrathoracic abnormality: IMPRESSION:  No acute cardiopulmonary abnormality.    Laboratory evaluation significant for CBC without a leukocytosis or anemia, troponin 6, TSH normal, BMP with hypokalemia to 3.0, otherwise unremarkable, hypomagnesemia 1.4.  The patient's potassium was replenished orally and IV.  Her magnesium was replenished orally.  She was offered IV magnesium replenishment but declined.  Her headache was treated with a migraine cocktail and on repeat assessment her symptoms were improved.  Given the chronicity of her presentation, advise close PCP follow-up for recheck of her electrolytes.  Will discharge her on a course of magnesium supplementation.   Final Clinical Impression(s) / ED Diagnoses Final diagnoses:  Other fatigue  Hypomagnesemia  Hypokalemia  Chronic nonintractable headache,  unspecified headache type    Rx / DC Orders ED Discharge Orders          Ordered    magnesium oxide (  MAG-OX) 400 MG tablet  Daily        06/12/23 1806              Ernie Avena, MD 06/12/23 1846

## 2023-06-12 NOTE — ED Notes (Signed)
Pt refusing magnesium IV and states she will just take her medication at home.

## 2023-06-12 NOTE — ED Triage Notes (Signed)
Pt arrived via EMS, from home. C/o headache and generally feeling unwell.

## 2023-06-14 ENCOUNTER — Other Ambulatory Visit (HOSPITAL_COMMUNITY): Payer: Self-pay

## 2023-06-14 ENCOUNTER — Other Ambulatory Visit: Payer: Self-pay | Admitting: Student

## 2023-06-14 DIAGNOSIS — E1142 Type 2 diabetes mellitus with diabetic polyneuropathy: Secondary | ICD-10-CM

## 2023-06-14 DIAGNOSIS — I10 Essential (primary) hypertension: Secondary | ICD-10-CM

## 2023-06-15 ENCOUNTER — Other Ambulatory Visit (HOSPITAL_COMMUNITY): Payer: Self-pay

## 2023-06-16 ENCOUNTER — Telehealth: Payer: Self-pay

## 2023-06-16 NOTE — Telephone Encounter (Signed)
Patient scheduled for aortogram on 7/9, called to advise that she forgot and took Trulicity on yesterday, 7/2 and wanted to see if OK to proceed. Per Janne Napoleon, RN it is fine for patient is continue as scheduled. Patient made aware of this information. Reminded patient to adjust insulin to half dose and hold Xigduo the morning of procedure. She verbalized understanding.

## 2023-06-18 ENCOUNTER — Other Ambulatory Visit (HOSPITAL_COMMUNITY): Payer: Self-pay

## 2023-06-21 ENCOUNTER — Encounter: Payer: Self-pay | Admitting: *Deleted

## 2023-06-21 ENCOUNTER — Ambulatory Visit: Payer: Self-pay | Admitting: *Deleted

## 2023-06-22 ENCOUNTER — Encounter (HOSPITAL_COMMUNITY)
Admission: RE | Disposition: A | Discharge status: Home / Self Care | Payer: Self-pay | Source: Home / Self Care | Attending: Surgery

## 2023-06-22 ENCOUNTER — Ambulatory Visit (HOSPITAL_COMMUNITY)
Admission: RE | Admit: 2023-06-22 | Discharge: 2023-06-22 | Disposition: A | Discharge status: Home / Self Care | Payer: 59 | Source: Home / Self Care | Attending: Surgery | Admitting: Surgery

## 2023-06-22 DIAGNOSIS — Z79899 Other long term (current) drug therapy: Secondary | ICD-10-CM | POA: Diagnosis not present

## 2023-06-22 DIAGNOSIS — E1140 Type 2 diabetes mellitus with diabetic neuropathy, unspecified: Secondary | ICD-10-CM | POA: Diagnosis not present

## 2023-06-22 DIAGNOSIS — I70213 Atherosclerosis of native arteries of extremities with intermittent claudication, bilateral legs: Secondary | ICD-10-CM | POA: Diagnosis not present

## 2023-06-22 DIAGNOSIS — Z794 Long term (current) use of insulin: Secondary | ICD-10-CM | POA: Diagnosis not present

## 2023-06-22 DIAGNOSIS — E1151 Type 2 diabetes mellitus with diabetic peripheral angiopathy without gangrene: Secondary | ICD-10-CM | POA: Diagnosis present

## 2023-06-22 DIAGNOSIS — Z7985 Long-term (current) use of injectable non-insulin antidiabetic drugs: Secondary | ICD-10-CM | POA: Insufficient documentation

## 2023-06-22 DIAGNOSIS — I70509 Unspecified atherosclerosis of nonautologous biological bypass graft(s) of the extremities, unspecified extremity: Secondary | ICD-10-CM

## 2023-06-22 DIAGNOSIS — Z7982 Long term (current) use of aspirin: Secondary | ICD-10-CM | POA: Diagnosis not present

## 2023-06-22 DIAGNOSIS — T82858A Stenosis of vascular prosthetic devices, implants and grafts, initial encounter: Secondary | ICD-10-CM | POA: Diagnosis not present

## 2023-06-22 HISTORY — PX: ABDOMINAL AORTOGRAM W/LOWER EXTREMITY: CATH118223

## 2023-06-22 LAB — GLUCOSE, CAPILLARY: Glucose-Capillary: 82 mg/dL (ref 70–99)

## 2023-06-22 LAB — POCT I-STAT, CHEM 8
BUN: 10 mg/dL (ref 8–23)
Calcium, Ion: 1.29 mmol/L (ref 1.15–1.40)
Chloride: 102 mmol/L (ref 98–111)
Creatinine, Ser: 0.6 mg/dL (ref 0.44–1.00)
Glucose, Bld: 108 mg/dL — ABNORMAL HIGH (ref 70–99)
HCT: 37 % (ref 36.0–46.0)
Hemoglobin: 12.6 g/dL (ref 12.0–15.0)
Potassium: 3 mmol/L — ABNORMAL LOW (ref 3.5–5.1)
Sodium: 143 mmol/L (ref 135–145)
TCO2: 29 mmol/L (ref 22–32)

## 2023-06-22 SURGERY — ABDOMINAL AORTOGRAM W/LOWER EXTREMITY
Anesthesia: LOCAL

## 2023-06-22 MED ORDER — LIDOCAINE HCL (PF) 1 % IJ SOLN
INTRAMUSCULAR | Status: AC
  Filled 2023-06-22: qty 30

## 2023-06-22 MED ORDER — OXYCODONE HCL 5 MG PO TABS: 5.0000 mg | ORAL_TABLET | ORAL | Status: DC | PRN

## 2023-06-22 MED ORDER — ASPIRIN 81 MG PO TBEC: 81.0000 mg | DELAYED_RELEASE_TABLET | Freq: Every day | ORAL | Status: DC

## 2023-06-22 MED ORDER — SODIUM CHLORIDE 0.9% FLUSH: 3.0000 mL | Freq: Two times a day (BID) | INTRAVENOUS | Status: DC

## 2023-06-22 MED ORDER — HEPARIN (PORCINE) IN NACL 1000-0.9 UT/500ML-% IV SOLN
INTRAVENOUS | Status: DC | PRN
  Administered 2023-06-22 (×2): 500 mL

## 2023-06-22 MED ORDER — FENTANYL CITRATE (PF) 100 MCG/2ML IJ SOLN
INTRAMUSCULAR | Status: DC | PRN
  Administered 2023-06-22: 50 ug via INTRAVENOUS
  Administered 2023-06-22: 25 ug via INTRAVENOUS

## 2023-06-22 MED ORDER — ONDANSETRON HCL 4 MG/2ML IJ SOLN: 4.0000 mg | Freq: Four times a day (QID) | INTRAMUSCULAR | Status: DC | PRN

## 2023-06-22 MED ORDER — SODIUM CHLORIDE 0.9 % WEIGHT BASED INFUSION: 1.0000 mL/kg/h | INTRAVENOUS | Status: DC

## 2023-06-22 MED ORDER — SODIUM CHLORIDE 0.9 % IV SOLN: INTRAVENOUS | Status: DC

## 2023-06-22 MED ORDER — LIDOCAINE HCL (PF) 1 % IJ SOLN
INTRAMUSCULAR | Status: DC | PRN
  Administered 2023-06-22: 12 mL

## 2023-06-22 MED ORDER — SODIUM CHLORIDE 0.9% FLUSH: 3.0000 mL | INTRAVENOUS | Status: DC | PRN

## 2023-06-22 MED ORDER — FENTANYL CITRATE (PF) 100 MCG/2ML IJ SOLN
INTRAMUSCULAR | Status: AC
  Filled 2023-06-22: qty 2

## 2023-06-22 MED ORDER — IODIXANOL 320 MG/ML IV SOLN
INTRAVENOUS | Status: DC | PRN
  Administered 2023-06-22: 123 mL

## 2023-06-22 MED ORDER — ACETAMINOPHEN 325 MG PO TABS: 650.0000 mg | ORAL_TABLET | ORAL | Status: DC | PRN

## 2023-06-22 MED ORDER — MIDAZOLAM HCL 2 MG/2ML IJ SOLN
INTRAMUSCULAR | Status: AC
  Filled 2023-06-22: qty 2

## 2023-06-22 MED ORDER — SODIUM CHLORIDE 0.9 % IV SOLN: 250.0000 mL | INTRAVENOUS | Status: DC | PRN

## 2023-06-22 MED ORDER — LABETALOL HCL 5 MG/ML IV SOLN: 10.0000 mg | INTRAVENOUS | Status: DC | PRN

## 2023-06-22 MED ORDER — MIDAZOLAM HCL 2 MG/2ML IJ SOLN
INTRAMUSCULAR | Status: DC | PRN
  Administered 2023-06-22 (×2): 1 mg via INTRAVENOUS

## 2023-06-22 MED ORDER — MORPHINE SULFATE (PF) 2 MG/ML IV SOLN: 2.0000 mg | INTRAVENOUS | Status: DC | PRN

## 2023-06-22 MED ORDER — HYDRALAZINE HCL 20 MG/ML IJ SOLN: 5.0000 mg | INTRAMUSCULAR | Status: DC | PRN

## 2023-06-22 SURGICAL SUPPLY — 14 items
CATH ANGIO 5F BER2 65CM (CATHETERS) IMPLANT
CATH OMNI FLUSH 5F 65CM (CATHETERS) IMPLANT
DEVICE VASC CLSR CELT ART 5 (Vascular Products) IMPLANT
KIT MICROPUNCTURE NIT STIFF (SHEATH) IMPLANT
KIT PV (KITS) ×1 IMPLANT
SHEATH PINNACLE 5F 10CM (SHEATH) IMPLANT
SHEATH PROBE COVER 6X72 (BAG) IMPLANT
STOPCOCK MORSE 400PSI 3WAY (MISCELLANEOUS) IMPLANT
SYR MEDRAD MARK 7 150ML (SYRINGE) ×1 IMPLANT
TRANSDUCER W/STOPCOCK (MISCELLANEOUS) ×1 IMPLANT
TRAY PV CATH (CUSTOM PROCEDURE TRAY) ×1 IMPLANT
TUBING CIL FLEX 10 FLL-RA (TUBING) IMPLANT
WIRE BENTSON .035X145CM (WIRE) IMPLANT
WIRE TORQFLEX AUST .018X40CM (WIRE) IMPLANT

## 2023-06-22 NOTE — Op Note (Signed)
Patient name: Angie Cain MRN: [id number] DOB: Apr 14, 1956 Sex: female  06/22/2023 Pre-operative Diagnosis: BPG stenosis Post-operative diagnosis:  Same Surgeon:  Durene Cal Procedure Performed:  1.  U/s guided access, fem-fem bypass  2.  Aortogram with bifemoral runoff  3.  Bilateral leg runoff  4.  Conscious sedation, 37 minutes   Indications: This is a 30 female with remote history of a left right femoral-femoral bypass graft by Dr. Darrick Penna.  Recent ultrasound findings suggestive of inflow and outflow stenosis.  She comes in today for further evaluation and possible intervention  Procedure:  The patient was identified in the holding area and taken to room 8.  The patient was then placed supine on the table and prepped and draped in the usual sterile fashion.  A time out was called.  Conscious sedation was administered with the use of IV fentanyl and Versed under continuous physician and nurse monitoring.  Heart rate, blood pressure, and oxygen saturation were continuously monitored.  Total sedation time was 37 minutes.  Ultrasound was used to evaluate the femoral-femoral bypass graft which was pulsatile.  1% lidocaine was used for local anesthesia.  The femoral-femoral bypass graft was cannulated with a micropuncture needle in the right groin.  A 018 wire was then inserted followed by placement of micropuncture sheath.  A Bentson wire was inserted and a 5 French sheath was placed.  Contrast injections were performed with the catheter and the femoral-femoral graft.  I then used a Berenstein 2 catheter to get wire access up into the aorta.  A Omni Flush cath was placed into the aorta and an aortogram with bifemoral runoff was performed.  Selective leg angiograms were then performed.  Findings:   Aortogram: No significant renal artery stenosis was visualized.  The infrarenal abdominal is calcified but patent without significant stenosis.  The left common and external arteries are patent  without significant stenosis.  There is approximately a 50% calcified stenosis in the distal external iliac and common femoral artery.  The femoral-femoral bypass graft is patent without significant stenosis.  There is a approximate 50% stenosis in the outflow profunda branch on the right.  Right Lower Extremity: The right femoral anastomosis is widely patent.  Just beyond this there is approximately a 50% stenosis in the profundofemoral artery.  The superficial femoral artery is occluded with reconstitution of the diseased above-knee popliteal artery.  The below-knee popliteal artery is patent with two-vessel runoff via the peroneal and posterior tibial artery.  There is diffuse disease out onto the foot  Left Lower Extremity: There is approximately 50% stenosis above the anastomosis in the left common femoral and distal external iliac artery.  Profunda femoris.  This course without significant stenosis.  The superficial femoral artery is occluded with reconstitution of the above-knee popliteal artery which is calcified.  The below-knee popliteal artery is patent with two-vessel posterior tibial and peroneal artery.  There is diffuse disease of the foot  Intervention: The sheath was removed with a Celt closure  Impression:  #1  No significant aortic stenosis  #2  There is approximately 50% stenosis in the left common femoral and distal external iliac artery above the femoral-femoral graft.  #3  There is approximate 50% stenosis in the right profundofemoral artery as the outflow to the femoral-femoral graft  #4  Bilateral superficial femoral artery occlusion with popliteal reconstitution and two-vessel runoff via the posterior tibial artery with diffuse disease on the foot  #5  Patient should continue with ultrasound surveillance.  Should her disease progress, she will likely require surgical intervention   V. Durene Cal, M.D., [facility] [place] Office:  [phone] Pager:  [phone]

## 2023-06-22 NOTE — Interval H&P Note (Signed)
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  The various methods of treatment have been discussed with the patient and family. After consideration of risks, benefits and other options for treatment, the patient has consented to  Procedure(s): ABDOMINAL AORTOGRAM W/LOWER EXTREMITY (N/A) as a surgical intervention.  The patient's history has been reviewed, patient examined, no change in status, stable for surgery.  I have reviewed the patient's chart and labs.  Questions were answered to the patient's satisfaction.     Durene Cal
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
## 2023-06-23 ENCOUNTER — Encounter (HOSPITAL_COMMUNITY): Payer: Self-pay | Admitting: Surgery

## 2023-06-25 ENCOUNTER — Telehealth: Payer: Self-pay | Admitting: Surgery

## 2023-06-25 NOTE — Telephone Encounter (Signed)
-----   Message from Durene Cal sent at 06/22/2023  8:38 AM EDT ----- 06/22/2023:  Surgeon:  Durene Cal Procedure Performed:  1.  U/s guided access, fem-fem bypass  2.  Aortogram with bifemoral runoff  3.  Bilateral leg runoff  4.  Conscious sedation, 37 minutes     Outpatient follow-up in [facility] in 6 months with ultrasound for femoral-femoral bypass graft

## 2023-06-29 NOTE — Telephone Encounter (Signed)
 Appt has been scheduled.

## 2023-07-05 ENCOUNTER — Encounter: Payer: Self-pay | Admitting: *Deleted

## 2023-07-05 ENCOUNTER — Ambulatory Visit: Payer: Self-pay | Admitting: *Deleted

## 2023-07-05 NOTE — Patient Instructions (Signed)
Visit Information  Thank you for taking time to visit with me today. Please don't hesitate to contact me if I can be of assistance to you.   Following are the goals we discussed today:   Goals Addressed               This Visit's Progress     Obtain Grief & Loss Counseling, Supportive Services & Resources. (pt-stated)   On track     Care Coordination Interventions:  Interventions Today    Flowsheet Row Most Recent Value  Chronic Disease   Chronic disease during today's visit Hypertension (HTN), Diabetes, Other  [Major Depression, Bereavement, Complex Grief & Tobacco Use Disorder]  General Interventions   General Interventions Discussed/Reviewed General Interventions Discussed, Labs, Vaccines, Doctor Visits, Health Screening, Annual Foot Exam, General Interventions Reviewed, Annual Eye Exam, Durable Medical Equipment (DME), Community Resources, Level of Care, Communication with  [Communication with Primary Care Provider]  Labs Hgb A1c every 3 months  [Encouraged]  Vaccines COVID-19, Flu, Shingles, RSV, Pneumonia, Tetanus/Pertussis/Diphtheria  [Encouraged]  Doctor Visits Discussed/Reviewed Doctor Visits Discussed, Doctor Visits Reviewed, Annual Wellness Visits, PCP, Specialist  [Encouraged]  Health Screening Bone Density, Colonoscopy, Mammogram  [Encouraged]  Durable Medical Equipment (DME) Glucomoter, BP Cuff, Other  [Cane]  PCP/Specialist Visits Compliance with follow-up visit  [Encouraged]  Communication with PCP/Specialists  [Encouraged]  Exercise Interventions   Exercise Discussed/Reviewed Exercise Discussed, Exercise Reviewed, Physical Activity, Weight Managment, Assistive device use and maintanence  [Encouraged]  Physical Activity Discussed/Reviewed Physical Activity Discussed, Physical Activity Reviewed, Types of exercise, Home Exercise Program (HEP)  [Encouraged]  Weight Management Weight maintenance  [Encouraged]  Education Interventions   Education Provided Provided  Therapist, sports, Provided Web-based Education, Provided Education  Provided Verbal Education On Nutrition, Mental Health/Coping with Illness, When to see the doctor, Foot Care, Eye Care, Labs, Blood Sugar Monitoring, Exercise, Medication, Development worker, community, Community Resources  [Encouraged]  Mental Health Interventions   Mental Health Discussed/Reviewed Mental Health Discussed, Anxiety, Depression, Mental Health Reviewed, Grief and Loss, Substance Abuse, Coping Strategies, Suicide, Other, Crisis  [Domestic Violence]  Nutrition Interventions   Nutrition Discussed/Reviewed Nutrition Discussed, Adding fruits and vegetables, Increaing proteins, Decreasing fats, Decreasing salt, Supplmental nutrition, Decreasing sugar intake, Portion sizes, Carbohydrate meal planning, Nutrition Reviewed, Fluid intake  [Encouraged]  Pharmacy Interventions   Pharmacy Dicussed/Reviewed Pharmacy Topics Discussed, Pharmacy Topics Reviewed, Medication Adherence, Affording Medications  [Encouraged]  Safety Interventions   Safety Discussed/Reviewed Safety Discussed, Safety Reviewed, Fall Risk  [Encouraged]  Advanced Directive Interventions   Advanced Directives Discussed/Reviewed Advanced Directives Discussed  [Encouraged Completion]      Active Listening & Reflection Utilized.  Verbalization of Feelings Encouraged.  Emotional Support Provided. Feelings of Grief & Loss Validated. Symptoms of Depression Acknowledged. Caregiver Stress Discussed. Caregiver Resources Reviewed. Problem Solving Interventions Activated. Task-Centered Solutions Employed. Solution-Focused Strategies Implemented. Acceptance & Commitment Therapy Performed. Cognitive Behavioral Therapy Indicated. Client-Centered Therapy Initiated. Encouraged Review & Contact with Medicaid Approved Dentists in [facility], from List Provided. Encouraged Review & Contact with [employer] In-Network Preferred Dentists in [place], from  List Provided. Encouraged Review & Contact with Medicaid Approved Audiologists in [facility], from List Provided. Encouraged Review & Contact with [employer] In-Network Preferred Audiology Providers, from List Provided. Encouraged Review of The Following List of Levi Strauss, Nurse, adult, Gaffer with Home Modifications & Repairs, to Administrator, Increasing Water Pressure, Administrator, Civil Service, Lowering News Corporation, Veterinary surgeon on Eli Lilly and Company, Which Include:   ~  [place]   ~ Americans with Disabilities Act   ~ [employer] with Target Corporation, Agencies, & Resources of Interest, in An Effort to Obtain a Therapist, sports for Psychotropic Medication Administration & Geneticist, molecular for Psychotherapeutic Counseling & Cytogeneticist. Encouraged Completion of Process for Obtaining Legal Guardianship of Spouse, Due to Memory Deficits, & Confirmed Receipt of Legal Guardianship Paperwork. Encouraged Completion of Process for Obtaining Advance Directives (Living Will & Healthcare Power of Attorney Documents) & Confirmed Receipt of Scientist, product/process development. Encouraged Completion of Process for Obtaining Personal Care Services, through [facility] [phone]), & Confirmed Receipt of Personal Care Services Application. Continued Review of The Following List of Financial & Warden/ranger, Agencies, Resources, Occupational psychologist, to [employer] & Assist with Referral Process: ~ Standard Pacific, Express Scripts, & Soup Medco Health Solutions ~ The Smith[employer] Foods Book ~ The Little RadioShack Book  ~ P[employer] Dodge & Nutrition Program Application ~ Reynolds American of The [employer] ~ Scientist, forensic ~ Geographical information systems officer of [employer] Us ~ Resources for Seniors ~ MetLife ~ Low Income Curator ~ TEFL teacher ~ Midwife with CSW (# [phone]), if You Have Questions, Need Assistance, or If Additional Social Work Needs Are Identified Between Now & Our Next Scheduled Follow-Up Outreach Call.      Our next appointment is by telephone on 07/12/2023 at 10:00 am.   Please call the care guide team at [phone] if you need to cancel or reschedule your appointment.   If you are experiencing a Mental Health or Behavioral Health Crisis or need someone to talk to, please call the Suicide and Crisis Lifeline: 988 call the [place] National Suicide Prevention Lifeline: (971)301-9494 or TTY: 309 348 4347 TTY (865)444-8897) to talk to a trained counselor call 1-800-273-TALK (toll free, 24 hour hotline) go to [facility] [address] [place] [phone]) call the [facility] Crisis Line: [phone] call 911  Patient verbalizes understanding of instructions and care plan provided today and agrees to view in MyChart. Active MyChart status and patient understanding of how to access instructions and care plan via MyChart confirmed with patient.     Telephone follow up appointment with care management team member scheduled for:  07/12/2023 at 10:00 am.   Danford Bad, BSW, MSW, LCSW  Licensed Clinical Social Worker  [facility]  Mailing Sharon. [address], [place], [place] [address] Physical Address[address], [place], [place] [address] Toll Free Main # [phone] Fax # [phone] Cell # [phone] [email or web address]

## 2023-07-05 NOTE — Patient Outreach (Signed)
Care Coordination   Follow Up Visit Note   07/05/2023  Name: Angie Cain MRN: [id number] DOB: 1956/05/13  Angie Cain is a 67 y.o. year old female who sees Monna Fam, MD for primary care. I spoke with [name] by phone today.  What matters to the patients health and wellness today?  Obtain Grief & Loss Counseling, Supportive Services & Resources.   Goals Addressed               This Visit's Progress     Obtain Grief & Loss Counseling, Supportive Services & Resources. (pt-stated)   On track     Care Coordination Interventions:  Interventions Today    Flowsheet Row Most Recent Value  Chronic Disease   Chronic disease during today's visit Hypertension (HTN), Diabetes, Other  [Major Depression, Bereavement, Complex Grief & Tobacco Use Disorder]  General Interventions   General Interventions Discussed/Reviewed General Interventions Discussed, Labs, Vaccines, Doctor Visits, Health Screening, Annual Foot Exam, General Interventions Reviewed, Annual Eye Exam, Durable Medical Equipment (DME), Community Resources, Level of Care, Communication with  [Communication with Primary Care Provider]  Labs Hgb A1c every 3 months  [Encouraged]  Vaccines COVID-19, Flu, Shingles, RSV, Pneumonia, Tetanus/Pertussis/Diphtheria  [Encouraged]  Doctor Visits Discussed/Reviewed Doctor Visits Discussed, Doctor Visits Reviewed, Annual Wellness Visits, PCP, Specialist  [Encouraged]  Health Screening Bone Density, Colonoscopy, Mammogram  [Encouraged]  Durable Medical Equipment (DME) Glucomoter, BP Cuff, Other  [Cane]  PCP/Specialist Visits Compliance with follow-up visit  [Encouraged]  Communication with PCP/Specialists  [Encouraged]  Exercise Interventions   Exercise Discussed/Reviewed Exercise Discussed, Exercise Reviewed, Physical Activity, Weight Managment, Assistive device use and maintanence  [Encouraged]  Physical Activity Discussed/Reviewed Physical Activity Discussed, Physical  Activity Reviewed, Types of exercise, Home Exercise Program (HEP)  [Encouraged]  Weight Management Weight maintenance  [Encouraged]  Education Interventions   Education Provided Provided Therapist, sports, Provided Web-based Education, Provided Education  Provided Verbal Education On Nutrition, Mental Health/Coping with Illness, When to see the doctor, Foot Care, Eye Care, Labs, Blood Sugar Monitoring, Exercise, Medication, Development worker, community, Community Resources  [Encouraged]  Mental Health Interventions   Mental Health Discussed/Reviewed Mental Health Discussed, Anxiety, Depression, Mental Health Reviewed, Grief and Loss, Substance Abuse, Coping Strategies, Suicide, Other, Crisis  [Domestic Violence]  Nutrition Interventions   Nutrition Discussed/Reviewed Nutrition Discussed, Adding fruits and vegetables, Increaing proteins, Decreasing fats, Decreasing salt, Supplmental nutrition, Decreasing sugar intake, Portion sizes, Carbohydrate meal planning, Nutrition Reviewed, Fluid intake  [Encouraged]  Pharmacy Interventions   Pharmacy Dicussed/Reviewed Pharmacy Topics Discussed, Pharmacy Topics Reviewed, Medication Adherence, Affording Medications  [Encouraged]  Safety Interventions   Safety Discussed/Reviewed Safety Discussed, Safety Reviewed, Fall Risk  [Encouraged]  Advanced Directive Interventions   Advanced Directives Discussed/Reviewed Advanced Directives Discussed  [Encouraged Completion]      Active Listening & Reflection Utilized.  Verbalization of Feelings Encouraged.  Emotional Support Provided. Feelings of Grief & Loss Validated. Symptoms of Depression Acknowledged. Caregiver Stress Discussed. Caregiver Resources Reviewed. Problem Solving Interventions Activated. Task-Centered Solutions Employed. Solution-Focused Strategies Implemented. Acceptance & Commitment Therapy Performed. Cognitive Behavioral Therapy Indicated. Client-Centered Therapy Initiated. Encouraged Review & Contact  with Medicaid Approved Dentists in [facility], from List Provided. Encouraged Review & Contact with [employer] In-Network Preferred Dentists in [place], from List Provided. Encouraged Review & Contact with Medicaid Approved Audiologists in [facility], from List Provided. Encouraged Review & Contact with [employer] In-Network Preferred Audiology Providers, from List Provided. Encouraged Review of The Following List of Levi Strauss, Walt Disney, Insurance account manager  Assistance with Home Modifications & Repairs, to Administrator, Increasing Water Pressure, Installing Grab Bars, Lowering News Corporation, Veterinary surgeon on Eli Lilly and Company, Which Include:   ~ Frederich Chick   ~ Americans with Disabilities Act   ~ [employer] Noble with Target Corporation, Agencies, & Resources of Interest, in An Effort to Obtain a Therapist, sports for Psychotropic Medication Administration & Geneticist, molecular for Psychotherapeutic Counseling & Cytogeneticist. Encouraged Completion of Process for Obtaining Legal Guardianship of Spouse, Due to Memory Deficits, & Confirmed Receipt of Legal Guardianship Paperwork. Encouraged Completion of Process for Obtaining Advance Directives (Living Will & Healthcare Power of Attorney Documents) & Confirmed Receipt of Scientist, product/process development. Encouraged Completion of Process for Obtaining Personal Care Services, through [facility] [phone]), & Confirmed Receipt of Personal Care Services Application. Continued Review of The Following List of Financial & Warden/ranger, Agencies, Resources, Occupational psychologist, to [employer] & Assist with Referral Process: ~ Standard Pacific, Express Scripts, & Soup Medco Health Solutions ~ The Smithfield Foods Book ~ The Little RadioShack Book  ~ Phel[employer] Dodge & Nutrition Program Application ~ Reynolds American of The [employer] ~ Scientist, forensic ~ Geographical information systems officer of [employer] Us ~ Resources for Seniors ~ MetLife ~ Low Income Chief Technology Officer ~ TEFL teacher ~ Midwife with CSW (# [phone]), if You Have Questions, Need Assistance, or If Additional Social Work Needs Are Identified Between Now & Our Next Scheduled Follow-Up Outreach Call.      SDOH assessments and interventions completed:  Yes.  Care Coordination Interventions:  Yes, provided.   Follow up plan: Follow up call scheduled for 07/12/2023 at 10:00 am.   Encounter Outcome:  Pt. Visit Completed.   Danford Bad, BSW, MSW, LCSW  Licensed Restaurant manager, [facility]  Mailing Stewart N. [address], [place], [place] [address] Physical Address-[address], [place], [place] [address] Toll Free Main # [phone] Fax # [phone] Cell # [phone] [email or web address]

## 2023-07-12 ENCOUNTER — Ambulatory Visit: Payer: Self-pay | Admitting: *Deleted

## 2023-07-12 ENCOUNTER — Encounter: Payer: Self-pay | Admitting: *Deleted

## 2023-07-12 NOTE — Patient Instructions (Signed)
Visit Information  Thank you for taking time to visit with me today. Please don't hesitate to contact me if I can be of assistance to you.   Following are the goals we discussed today:   Goals Addressed               This Visit's Progress     Obtain Grief & Loss Counseling, Supportive Services & Resources. (pt-stated)   On track     Care Coordination Interventions:  Interventions Today    Flowsheet Row Most Recent Value  Chronic Disease   Chronic disease during today's visit Hypertension (HTN), Diabetes, Other  [Major Depression, Bereavement, Complex Grief & Tobacco Use Disorder]  General Interventions   General Interventions Discussed/Reviewed General Interventions Discussed, Labs, Vaccines, Doctor Visits, Health Screening, Annual Foot Exam, General Interventions Reviewed, Annual Eye Exam, Durable Medical Equipment (DME), Community Resources, Level of Care, Communication with  [Communication with Primary Care Provider]  Labs Hgb A1c every 3 months  [Encouraged]  Vaccines COVID-19, Flu, Shingles, RSV, Pneumonia, Tetanus/Pertussis/Diphtheria  [Encouraged]  Doctor Visits Discussed/Reviewed Doctor Visits Discussed, Doctor Visits Reviewed, Annual Wellness Visits, PCP, Specialist  [Encouraged]  Health Screening Bone Density, Colonoscopy, Mammogram  [Encouraged]  Durable Medical Equipment (DME) Glucomoter, BP Cuff, Other  [Cane]  PCP/Specialist Visits Compliance with follow-up visit  [Encouraged]  Communication with PCP/Specialists  [Encouraged]  Exercise Interventions   Exercise Discussed/Reviewed Exercise Discussed, Exercise Reviewed, Physical Activity, Weight Managment, Assistive device use and maintanence  [Encouraged]  Physical Activity Discussed/Reviewed Physical Activity Discussed, Physical Activity Reviewed, Types of exercise, Home Exercise Program (HEP)  [Encouraged]  Weight Management Weight maintenance  [Encouraged]  Education Interventions   Education Provided Provided  Therapist, sports, Provided Web-based Education, Provided Education  Provided Verbal Education On Nutrition, Mental Health/Coping with Illness, When to see the doctor, Foot Care, Eye Care, Labs, Blood Sugar Monitoring, Exercise, Medication, Development worker, community, Community Resources  [Encouraged]  Mental Health Interventions   Mental Health Discussed/Reviewed Mental Health Discussed, Anxiety, Depression, Mental Health Reviewed, Grief and Loss, Substance Abuse, Coping Strategies, Suicide, Other, Crisis  [Domestic Violence]  Nutrition Interventions   Nutrition Discussed/Reviewed Nutrition Discussed, Adding fruits and vegetables, Increaing proteins, Decreasing fats, Decreasing salt, Supplmental nutrition, Decreasing sugar intake, Portion sizes, Carbohydrate meal planning, Nutrition Reviewed, Fluid intake  [Encouraged]  Pharmacy Interventions   Pharmacy Dicussed/Reviewed Pharmacy Topics Discussed, Pharmacy Topics Reviewed, Medication Adherence, Affording Medications  [Encouraged]  Safety Interventions   Safety Discussed/Reviewed Safety Discussed, Safety Reviewed, Fall Risk  [Encouraged]  Advanced Directive Interventions   Advanced Directives Discussed/Reviewed Advanced Directives Discussed  [Encouraged Completion]      Active Listening & Reflection Utilized.  Verbalization of Feelings Encouraged.  Emotional Support Provided. Feelings of Grief & Loss Validated. Grief & Loss Support Resources Revisited. Grief & Loss Support Groups Reviewed. Self-Enrollment in Grief & Loss Support Group of Interest Emphasized. Symptoms of Depression & Grief Acknowledged. Problem Solving Interventions Activated. Task-Centered Solutions Employed. Solution-Focused Strategies Implemented. Acceptance & Commitment Therapy Performed. Cognitive Behavioral Therapy Indicated. Client-Centered Therapy Initiated. Encouraged Administration of Medications, Exactly as Prescribed. Encouraged Increased Level of Activity & Exercise,  as Tolerated. Encouraged Implementation of Deep Breathing Exercises, Relaxation Techniques, & Mindfulness Meditation Strategies Daily. Confirmed Receipt, Thoroughly Reviewed, & Encouraged Contact with Medicaid Approved Dentists of Interest in [facility], from List Provided, in An Effort to Obtain Reputable, Reliable, Available, & Affordable Dental Services. Confirmed Receipt, Thoroughly Reviewed, & Encouraged Contact with [employer] In-Network Preferred Dentists of Interest in [place], from List Provided, in An  Effort to Obtain Reputable, Reliable, Available, & Affordable Dental Services. Confirmed Receipt, Thoroughly Reviewed, & Encouraged Contact with Medicaid Approved Audiologists of Interest in [facility], from List Provided, in An Effort to Obtain Reputable, Reliable, Available, & Affordable Hearing Services. Confirmed Receipt, Thoroughly Reviewed, & Encouraged Contact with Therapist, nutritional Preferred Audiology Providers of Interest in [place], from List Provided, in An Effort to Obtain Reputable, Reliable, Available, & Affordable Hearing Services. Confirmed Receipt, Thoroughly Reviewed, & Encouraged Contact with Home Modification/Repair Levi Strauss, Nurse, adult, Field seismologist of Interest in [place], from List Provided, in An Effort to [employer] with Chemical engineer, Increasing Water Pressure, Administrator, Civil Service, Lowering News Corporation, [employer] on Eli Lilly and Company, [place]. Confirmed Receipt, Thoroughly Reviewed, & Encouraged Research officer, political party, Agencies, & Resources of Interest in [place], from List Provided, in An Effort to Obtain a Therapist, sports for Psychotropic Medication Administration & Geneticist, molecular for Psychotherapeutic Counseling & Cytogeneticist. Confirmed Receipt, Thoroughly Reviewed, & Encouraged Sport and exercise psychologist, Agencies, Resources, Market researcher of Interest in [place], from List Provided, in An Effort to [employer], Food & Cendant Corporation. Confirmed Receipt, Thoroughly Reviewed, & Encouraged Completion of Application Process for Obtaining Legal Guardianship of Spouse, Due to Memory Deficits. Confirmed Receipt, Thoroughly Reviewed, & Encouraged Completion of Scientist, product/process development (Living Will & Healthcare Power of Attorney Documents), & Submission to Dr. Francine Graven, Primary Care Provider with [facility], [facility] [phone]), to Scan into Electronic Medical Record in Epic. Confirmed Receipt, Thoroughly Reviewed, & Encouraged Completion of Application Process for Obtaining Personal Care Services, through [facility] [phone]), & Submission of Application to Dr. Monna Fam, Primary Care Provider with [facility], [facility] [phone]), for Review & Signature. Encouraged Contact with CSW (# [phone]), if You Have Questions, Need Assistance, or If Additional Social Work Needs Are Identified Between Now & Our Next Scheduled Follow-Up Outreach Call.      Our next appointment is by telephone on 08/09/2023 at 12:45 pm.  Please call the care guide team at [phone] if you need to cancel or reschedule your appointment.   If you are experiencing a Mental Health or Behavioral Health Crisis or need someone to talk to, please call the Suicide and Crisis Lifeline: 988 call the [place] National Suicide Prevention Lifeline: 909-335-7641 or TTY: (331)052-7861 TTY 367 030 9530) to talk to a trained counselor call 1-800-273-TALK (toll free, 24 hour hotline) go to [facility] [address] [place] [phone]) call the [facility] Crisis Line: ([phone] call 911  Patient verbalizes understanding of instructions and care plan provided today and agrees to  view in MyChart. Active MyChart status and patient understanding of how to access instructions and care plan via MyChart confirmed with patient.     Telephone follow up appointment with care management team member scheduled for:  08/09/2023 at 12:45 pm.   Danford Bad, BSW, MSW, LCSW  Licensed Clinical Social Worker  [facility]  Mailing Cutter. [address] [place], [place] [address] Physical Address-[address] [place], [place] [address] Toll Free Main # [phone] Fax # [phone] Cell # [phone] [email or web address]

## 2023-07-12 NOTE — Patient Outreach (Signed)
Care Coordination   Follow Up Visit Note   07/12/2023  Name: Sorelle Tokarz MRN: [id number] DOB: 12-Sep-1956  Koren Maran is a 67 y.o. year old female who sees Monna Fam, MD for primary care. I spoke with [name] by phone today.  What matters to the patients health and wellness today?  Obtain Grief & Loss Counseling, Hydrologist, & Resources.   Goals Addressed               This Visit's Progress     Obtain Grief & Loss Counseling, Supportive Services & Resources. (pt-stated)   On track     Care Coordination Interventions:  Interventions Today    Flowsheet Row Most Recent Value  Chronic Disease   Chronic disease during today's visit Hypertension (HTN), Diabetes, Other  [Major Depression, Bereavement, Complex Grief & Tobacco Use Disorder]  General Interventions   General Interventions Discussed/Reviewed General Interventions Discussed, Labs, Vaccines, Doctor Visits, Health Screening, Annual Foot Exam, General Interventions Reviewed, Annual Eye Exam, Durable Medical Equipment (DME), Community Resources, Level of Care, Communication with  [Communication with Primary Care Provider]  Labs Hgb A1c every 3 months  [Encouraged]  Vaccines COVID-19, Flu, Shingles, RSV, Pneumonia, Tetanus/Pertussis/Diphtheria  [Encouraged]  Doctor Visits Discussed/Reviewed Doctor Visits Discussed, Doctor Visits Reviewed, Annual Wellness Visits, PCP, Specialist  [Encouraged]  Health Screening Bone Density, Colonoscopy, Mammogram  [Encouraged]  Durable Medical Equipment (DME) Glucomoter, BP Cuff, Other  [Cane]  PCP/Specialist Visits Compliance with follow-up visit  [Encouraged]  Communication with PCP/Specialists  [Encouraged]  Exercise Interventions   Exercise Discussed/Reviewed Exercise Discussed, Exercise Reviewed, Physical Activity, Weight Managment, Assistive device use and maintanence  [Encouraged]  Physical Activity Discussed/Reviewed Physical Activity Discussed, Physical  Activity Reviewed, Types of exercise, Home Exercise Program (HEP)  [Encouraged]  Weight Management Weight maintenance  [Encouraged]  Education Interventions   Education Provided Provided Therapist, sports, Provided Web-based Education, Provided Education  Provided Verbal Education On Nutrition, Mental Health/Coping with Illness, When to see the doctor, Foot Care, Eye Care, Labs, Blood Sugar Monitoring, Exercise, Medication, Development worker, community, Community Resources  [Encouraged]  Mental Health Interventions   Mental Health Discussed/Reviewed Mental Health Discussed, Anxiety, Depression, Mental Health Reviewed, Grief and Loss, Substance Abuse, Coping Strategies, Suicide, Other, Crisis  [Domestic Violence]  Nutrition Interventions   Nutrition Discussed/Reviewed Nutrition Discussed, Adding fruits and vegetables, Increaing proteins, Decreasing fats, Decreasing salt, Supplmental nutrition, Decreasing sugar intake, Portion sizes, Carbohydrate meal planning, Nutrition Reviewed, Fluid intake  [Encouraged]  Pharmacy Interventions   Pharmacy Dicussed/Reviewed Pharmacy Topics Discussed, Pharmacy Topics Reviewed, Medication Adherence, Affording Medications  [Encouraged]  Safety Interventions   Safety Discussed/Reviewed Safety Discussed, Safety Reviewed, Fall Risk  [Encouraged]  Advanced Directive Interventions   Advanced Directives Discussed/Reviewed Advanced Directives Discussed  [Encouraged Completion]      Active Listening & Reflection Utilized.  Verbalization of Feelings Encouraged.  Emotional Support Provided. Feelings of Grief & Loss Validated. Grief & Loss Support Resources Revisited. Grief & Loss Support Groups Reviewed. Self-Enrollment in Grief & Loss Support Group of Interest Emphasized. Symptoms of Depression & Grief Acknowledged. Problem Solving Interventions Activated. Task-Centered Solutions Employed. Solution-Focused Strategies Implemented. Acceptance & Commitment Therapy  Performed. Cognitive Behavioral Therapy Indicated. Client-Centered Therapy Initiated. Encouraged Administration of Medications, Exactly as Prescribed. Encouraged Increased Level of Activity & Exercise, as Tolerated. Encouraged Implementation of Deep Breathing Exercises, Relaxation Techniques, & Mindfulness Meditation Strategies Daily. Confirmed Receipt, Thoroughly Reviewed, & Encouraged Contact with Medicaid Approved Dentists of Interest in [facility], from List Provided, in An Effort to Obtain Reputable, Reliable,  Available, & Affordable Dental Services. Confirmed Receipt, Thoroughly Reviewed, & Encouraged Contact with [employer] In-Network Preferred Dentists of Interest in [place], from List Provided, in An Effort to Obtain Reputable, Reliable, Available, & Armed forces technical officer. Confirmed Receipt, Thoroughly Reviewed, & Encouraged Contact with Medicaid Approved Audiologists of Interest in [facility], from List Provided, in An Effort to Obtain Reputable, Reliable, Available, & Affordable Hearing Services. Confirmed Receipt, Thoroughly Reviewed, & Encouraged Contact with Therapist, nutritional Preferred Audiology Providers of Interest in [place], from List Provided, in An Effort to Obtain Reputable, Reliable, Available, & Affordable Hearing Services. Confirmed Receipt, Thoroughly Reviewed, & Encouraged Contact with Home Modification/Repair [employer], Nurse, adult, Field seismologist of Interest in [place], from List Provided, in An Effort to [employer] with Chemical engineer, Increasing Water Pressure, Administrator, Civil Service, Lowering [employer], [employer] on [employer], [place]. Confirmed Receipt, Thoroughly Reviewed, & Encouraged Research officer, political party, Agencies, & Resources of Interest in [place], from List Provided, in An Effort to Obtain a Therapist, sports for Psychotropic Medication  Administration & Geneticist, molecular for Psychotherapeutic Counseling & Cytogeneticist. Confirmed Receipt, Thoroughly Reviewed, & Encouraged Sport and exercise psychologist, Agencies, Resources, Occupational psychologist of Interest in [place], from List Provided, in An Effort to [employer], Food & [employer]. Confirmed Receipt, Thoroughly Reviewed, & Encouraged Completion of Application Process for Obtaining Legal Guardianship of Spouse, Due to Memory Deficits. Confirmed Receipt, Thoroughly Reviewed, & Encouraged Completion of Scientist, product/process development (Living Will & Healthcare Power of Attorney Documents), & Submission to Dr. Francine Graven, Primary Care Provider with [facility], [facility] [phone]), to Scan into Electronic Medical Record in Epic. Confirmed Receipt, Thoroughly Reviewed, & Encouraged Completion of Application Process for Obtaining Personal Care Services, through [facility] [phone]), & Submission of Application to Dr. Monna Fam, Primary Care Provider with [facility], [facility] [phone]), for Review & Signature. Encouraged Contact with CSW (# [phone]), if You Have Questions, Need Assistance, or If Additional Social Work Needs Are Identified Between Now & Our Next Scheduled Follow-Up Outreach Call.      SDOH assessments and interventions completed:  Yes.  Care Coordination Interventions:  Yes, provided.   Follow up plan: Follow up call scheduled for 08/09/2023 at 12:45 pm.   Encounter Outcome:  Pt. Visit Completed.   Danford Bad, BSW, MSW, LCSW  Licensed Restaurant manager, fast [facility]  Mailing [address] [place], [place] [address] Physical Address-[address] [place], [place] [address] Toll Free Main # [phone] Fax # [phone] Cell #  [phone] [email or web address]

## 2023-07-13 ENCOUNTER — Ambulatory Visit: Payer: Self-pay | Admitting: Licensed Clinical Social Worker

## 2023-07-13 NOTE — Patient Outreach (Signed)
  Care Coordination   Follow Up Visit Note   07/13/2023 Name: Angie Cain MRN: [id number] DOB: March 06, 1956  Angie Cain is a 67 y.o. year old female who sees Angie Fam, MD for primary care. I spoke with  Angie Cain by phone today.  What matters to the patients health and wellness today? Obtain Grief and Loss counseling , Supportive  Services and Resources     Goals Addressed               This Visit's Progress     Obtain Grief & Loss Counseling, Supportive Services & Resources. (pt-stated)        Care Coordination Interventions:  Spoke with client about client needs Discussed fact that client has been working recently with Angie Cain for Social Work support. Angie Cain has talked with client about food resources, counseling resources, and other community help support Suggested that client continue to work with Danford Cain Angie for SW issues and that Angie Cain take his name off client care team to allow Angie Cain to continue SW support for client. Client agreed to this plan Client was appreciative of call from Angie Cain today          SDOH assessments and interventions completed:  Yes  SDOH Interventions Today    Flowsheet Row Most Recent Value  SDOH Interventions   Depression Interventions/Treatment  Counseling  Stress Interventions Other (Comment)  [has stress in managing medical needs]        Care Coordination Interventions:  Yes, provided    Interventions Today    Flowsheet Row Most Recent Value  Chronic Disease   Chronic disease during today's visit Other  [spoke with client about client needs]  Angie Interventions   Angie Interventions Discussed/Reviewed Angie Interventions Discussed, Community Resources  [discussed program support]  Exercise Interventions   Exercise Discussed/Reviewed Physical Activity  Education Interventions   Education Provided Provided Education  Provided Verbal Education On [employer]  [discussed recent client support with Angie Cain]  Safety Interventions   Safety Discussed/Reviewed Fall Risk       Follow up plan: Angie Angie Cain to call client as scheduled to talk more with Angie Cain about her SW needs   Encounter Outcome:  Pt. Visit Completed   Kelton Pillar.Cherrish Vitali MSW, Angie Licensed Visual merchandiser [facility] Care Management [phone]

## 2023-07-13 NOTE — Patient Instructions (Signed)
Visit Information  Thank you for taking time to visit with me today. Please don't hesitate to contact me if I can be of assistance to you.   Following are the goals we discussed today:   Goals Addressed               This Visit's Progress     Obtain Grief & Loss Counseling, Supportive Services & Resources. (pt-stated)        Care Coordination Interventions:  Spoke with client about client needs Discussed fact that client has been working recently with LCSW Danford Bad for Social Work support. Mardene Celeste has talked with client about food resources, counseling resources, and other community help support Suggested that client continue to work with Danford Bad LCSW for SW issues and that LCSW Lorna Few take his name off client care team to allow Mardene Celeste to continue SW support for client. Client agreed to this plan Client was appreciative of call from LCSW Sheyenne Aveleen Nevers today         LCSW Danford Bad to call client as scheduled to talk more with client about client needs   Please call the care guide team at [phone] if you need to cancel or reschedule your appointment.   If you are experiencing a Mental Health or Behavioral Health Crisis or need someone to talk to, please go to [facility] [address] [place] [phone])   The patient verbalized understanding of instructions, educational materials, and care plan provided today and DECLINED offer to receive copy of patient instructions, educational materials, and care plan.   The patient has been provided with contact information for the care management team and has been advised to call with any health related questions or concerns.   Kelton Pillar.Berdella Bacot MSW, LCSW Licensed Visual merchandiser [facility] Care Management [phone]

## 2023-07-31 ENCOUNTER — Emergency Department (HOSPITAL_COMMUNITY)
Admission: EM | Admit: 2023-07-31 | Discharge: 2023-07-31 | Disposition: A | Discharge status: Home / Self Care | Payer: 59 | Attending: Emergency Medicine | Admitting: Emergency Medicine

## 2023-07-31 ENCOUNTER — Other Ambulatory Visit: Payer: Self-pay

## 2023-07-31 ENCOUNTER — Encounter (HOSPITAL_COMMUNITY): Payer: Self-pay | Admitting: Emergency Medicine

## 2023-07-31 DIAGNOSIS — Z7982 Long term (current) use of aspirin: Secondary | ICD-10-CM | POA: Diagnosis not present

## 2023-07-31 DIAGNOSIS — Z794 Long term (current) use of insulin: Secondary | ICD-10-CM | POA: Insufficient documentation

## 2023-07-31 DIAGNOSIS — U071 COVID-19: Secondary | ICD-10-CM | POA: Diagnosis not present

## 2023-07-31 DIAGNOSIS — R5383 Other fatigue: Secondary | ICD-10-CM | POA: Diagnosis present

## 2023-07-31 LAB — SARS CORONAVIRUS 2 BY RT PCR: SARS Coronavirus 2 by RT PCR: POSITIVE — AB

## 2023-07-31 NOTE — ED Triage Notes (Addendum)
Patient reports having flu/ pneumonia like symptoms, fatigue, chills, low PO intake, pain in bilateral legs. She has been in morning and believes that has had a impact on her health as well. She feels drained and has not been compliant with her medication lately. She is concerned for her husbands health and expresses she is really here so he would come. She has also had contact with her son who is Covid positive.

## 2023-07-31 NOTE — ED Provider Notes (Signed)
[place] EMERGENCY DEPARTMENT AT [facility] [place] Provider Note   CSN: [id number] Arrival date & time: 07/31/23  1207     History  Chief Complaint  Patient presents with   Weakness   Leg Pain   Chills    Angie Cain is a 67 y.o. female.  Patient with history of diabetes, GERD --presents to the emergency department today for evaluation of flulike symptoms.  Symptoms started about 3 days ago.  She is COVID-positive here.  States that son recently tested positive for COVID and she has been around some of her grandkids.  She reports generalized fatigue with an occasional cough.  Also generalized bodyaches worse in her legs and lower back.  She has had chills and temperature fluctuations, poor appetite.  She reports poor compliance with her home medications due to feeling so bad.  Patient's husband is also ill with similar symptoms.       Home Medications Prior to Admission medications   Medication Sig Start Date End Date Taking? Authorizing Provider  Accu-Chek FastClix Lancets MISC CHECK BLOOD SUGAR 3 TIMES A DAY 06/27/21   Evlyn Kanner, MD  Alcohol Swabs (B-D SINGLE USE SWABS REGULAR) PADS Use 1 pad 6 (six) times daily. 01/19/23   Steffanie Rainwater, MD  amLODipine (NORVASC) 10 MG tablet Take 10 mg by mouth every morning. 03/15/23   [provider]  aspirin EC 81 MG tablet Take 81 mg by mouth daily.    [provider]  atorvastatin (LIPITOR) 40 MG tablet Take 1 tablet (40 mg total) by mouth at bedtime. 01/19/23   Steffanie Rainwater, MD  Blood Glucose Calibration (ACCU-CHEK AVIVA) SOLN Use to check controls on diabetic glucose meter monthly 02/26/20   Chundi, Sherlyn Lees, MD  Blood Glucose Monitoring Suppl (ACCU-CHEK AVIVA PLUS) w/Device KIT Check blood sugar up to 3 times a day 02/26/20   Lorenso Courier, MD  brimonidine (ALPHAGAN) 0.2 % ophthalmic solution Place 1 drop into the left eye 2 (two) times daily. 12/30/22     calcium-vitamin D (OSCAL WITH D) 500-200  MG-UNIT tablet Take 1 tablet by mouth 2 (two) times daily. 03/17/19   Lorenso Courier, MD  cetirizine (ZYRTEC ALLERGY) 10 MG tablet Take 1 tablet (10 mg total) by mouth daily. 04/29/23   Rocky Morel, DO  Dapagliflozin Pro-metFORMIN ER (XIGDUO XR) 04-999 MG TB24 Take 1 tablet by mouth daily. 01/19/23   Steffanie Rainwater, MD  diclofenac Sodium (VOLTAREN) 1 % GEL Apply 2 g topically 4 (four) times daily. Patient not taking: Reported on 06/18/2023 06/12/21   Rehman, Areeg N, DO  Dulaglutide (TRULICITY) 1.5 MG/0.5ML SOPN Inject 1.5 mg into the skin once a week. 04/27/23   Rocky Morel, DO  famotidine (PEPCID) 20 MG tablet Take 1 tablet (20 mg total) by mouth at bedtime. Patient not taking: Reported on 06/18/2023 04/29/23   Rocky Morel, DO  glucose blood (ACCU-CHEK GUIDE) test strip Check blood sugar 3 times per day 07/07/21   Steffanie Rainwater, MD  ibuprofen (ADVIL) 600 MG tablet Take 1 tablet (600 mg total) by mouth every 6 (six) hours as needed. Patient taking differently: Take 600 mg by mouth every 6 (six) hours as needed for moderate pain or mild pain. 01/05/23   Ernie Avena, MD  insulin degludec (TRESIBA FLEXTOUCH) 100 UNIT/ML FlexTouch Pen Inject 22 Units into the skin daily. 04/27/23   Rocky Morel, DO  Insulin Pen Needle 32G X 4 MM MISC Use to inject insulin once daily at bedtime 04/27/23  Rocky Morel, DO  latanoprost (XALATAN) 0.005 % ophthalmic solution PLACE 1 DROP INTO BOTH EYES AT BEDTIME. 06/27/21   Evlyn Kanner, MD  lidocaine (LIDODERM) 5 % Place 1 patch onto the skin daily. Remove & Discard patch within 12 hours or as directed by MD Patient not taking: Reported on 06/18/2023 04/29/23   Rocky Morel, DO  mupirocin ointment (BACTROBAN) 2 % Apply 1 Application topically daily. Patient not taking: Reported on 06/18/2023 04/29/23   Rocky Morel, DO      Allergies    Bupropion, Jardiance [empagliflozin], Metronidazole, Shellfish allergy, and Shrimp extract    Review of  Systems   Review of Systems  Physical Exam Updated Vital Signs BP 107/74 (BP Location: Left Arm)   Pulse 90   Temp 98.4 F (36.9 C) (Oral)   Resp 18   Ht 5\' 5"  (1.651 m)   Wt 54.4 kg   SpO2 99%   BMI 19.97 kg/m   Physical Exam Vitals and nursing note reviewed.  Constitutional:      Appearance: She is well-developed.  HENT:     Head: Normocephalic and atraumatic.     Jaw: No trismus.     Right Ear: Tympanic membrane, ear canal and external ear normal.     Left Ear: Tympanic membrane, ear canal and external ear normal.     Nose: Nose normal. No mucosal edema or rhinorrhea.     Mouth/Throat:     Mouth: Mucous membranes are moist. Mucous membranes are not dry. No oral lesions.     Pharynx: Uvula midline. No oropharyngeal exudate, posterior oropharyngeal erythema or uvula swelling.     Tonsils: No tonsillar abscesses.  Eyes:     General:        Right eye: No discharge.        Left eye: No discharge.     Conjunctiva/sclera: Conjunctivae normal.  Cardiovascular:     Rate and Rhythm: Normal rate and regular rhythm.     Heart sounds: Normal heart sounds.  Pulmonary:     Effort: Pulmonary effort is normal. No respiratory distress.     Breath sounds: Normal breath sounds. No wheezing or rales.  Abdominal:     Palpations: Abdomen is soft.     Tenderness: There is no abdominal tenderness. There is no guarding or rebound.  Musculoskeletal:     Cervical back: Normal range of motion and neck supple.  Lymphadenopathy:     Cervical: No cervical adenopathy.  Skin:    General: Skin is warm and dry.  Neurological:     Mental Status: She is alert.  Psychiatric:        Mood and Affect: Mood normal.     ED Results / Procedures / Treatments   Labs (all labs ordered are listed, but only abnormal results are displayed) Labs Reviewed  SARS CORONAVIRUS 2 BY RT PCR - Abnormal; Notable for the following components:      Result Value   SARS Coronavirus 2 by RT PCR POSITIVE (*)    All  other components within normal limits  CBG MONITORING, ED    EKG None  Radiology No results found.  Procedures Procedures    Medications Ordered in ED Medications - No data to display  ED Course/ Medical Decision Making/ A&P    Patient seen and examined. History obtained directly from patient. Work-up including labs, imaging, EKG ordered in triage, if performed, were reviewed.    Labs/EKG: Independently reviewed and interpreted.  This included: COVID test, was  positive.  I have added CBG as patient states that she has not checked her blood sugar in a couple of days.  Imaging: None ordered  Medications/Fluids: None ordered  Most recent vital signs reviewed and are as follows: BP 107/74 (BP Location: Left Arm)   Pulse 90   Temp 98.4 F (36.9 C) (Oral)   Resp 18   Ht 5\' 5"  (1.651 m)   Wt 54.4 kg   SpO2 99%   BMI 19.97 kg/m   Initial impression: COVID-19 infection  Detailed discussion had with with patient regarding COVID-19 precautions and written instructions given as well.  We discussed need to isolate themselves for 5 days from onset of symptoms and have 24 hours of improvement prior to breaking isolation.  We discussed that when breaking isolation, mask wearing for 5 additional days is required.  We discussed signs symptoms to return which include worsening shortness of breath, trouble breathing, or increased work of breathing.  Also return with persistent vomiting, confusion, passing out, or if they have any other concerns. Counseled on the need for rest and good hydration. Discussed that high-risk contacts should be aware of positive result and they need to quarantine and be tested if they develop any symptoms. Patient verbalizes understanding.                                  Medical Decision Making  Patient with generalized flulike symptoms, positive for COVID.  Low concern for pneumonia given no shortness of breath, hypoxia, clear lung sounds on exam.  Patient  looks well-hydrated.  No associated vomiting.  Will continue supportive treatment.        Final Clinical Impression(s) / ED Diagnoses Final diagnoses:  COVID-19    Rx / DC Orders ED Discharge Orders     None         Renne Crigler, PA-C 07/31/23 1521    Long, Arlyss Repress, MD 08/02/23 [phone]

## 2023-07-31 NOTE — Discharge Instructions (Signed)
Please read and follow all provided instructions.  Your diagnoses today include:  1. COVID-19     Tests performed today include: Vital signs. See below for your results today.  COVID test - was positive  Medications prescribed:  None  Take any prescribed medications only as directed. Treatment for your infection is aimed at treating the symptoms. There are no medications, such as antibiotics, that will cure your infection.   Home care instructions:  Follow any educational materials contained in this packet.   Your illness is contagious and can be spread to others, especially during the first 3 or 4 days. It cannot be cured by antibiotics or other medicines. Take basic precautions such as washing your hands often, covering your mouth when you cough or sneeze, and avoiding public places where you could spread your illness to others.   Please continue drinking plenty of fluids.  Use over-the-counter medicines as needed as directed on packaging for symptom relief.  You may also use ibuprofen or tylenol as directed on packaging for pain or fever.  Do not take multiple medicines containing Tylenol or acetaminophen to avoid taking too much of this medication.  If you are positive for Covid-19, you should isolate yourself and not be exposed to other people for 5 days after your symptoms began. If you are not feeling better at day 5, you need to isolate yourself for a total of 10 days. If you are feeling better by day 5, you should wear a mask properly, over your nose and mouth, at all times while around other people until 10 days after your symptoms started.   Follow-up instructions: Please follow-up with your primary care provider as needed for further evaluation of your symptoms if you are not feeling better.   Return instructions:  Please return to the Emergency Department if you experience worsening symptoms.  Return to the emergency department if you have worsening shortness of breath  breathing or increased work of breathing, persistent vomiting RETURN IMMEDIATELY IF you develop shortness of breath, confusion or altered mental status, a new rash, become dizzy, faint, or poorly responsive, or are unable to be cared for at home. Please return if you have persistent vomiting and cannot keep down fluids or develop a fever that is not controlled by tylenol or motrin.   Please return if you have any other emergent concerns.  Additional Information:  Your vital signs today were: BP 107/74 (BP Location: Left Arm)   Pulse 90   Temp 98.4 F (36.9 C) (Oral)   Resp 18   Ht 5\' 5"  (1.651 m)   Wt 54.4 kg   SpO2 99%   BMI 19.97 kg/m  If your blood pressure (BP) was elevated above 135/85 this visit, please have this repeated by your doctor within one month. --------------

## 2023-08-02 ENCOUNTER — Other Ambulatory Visit: Payer: Self-pay | Admitting: Dietician

## 2023-08-02 ENCOUNTER — Other Ambulatory Visit (HOSPITAL_COMMUNITY): Payer: Self-pay

## 2023-08-02 DIAGNOSIS — E119 Type 2 diabetes mellitus without complications: Secondary | ICD-10-CM

## 2023-08-02 DIAGNOSIS — Z794 Long term (current) use of insulin: Secondary | ICD-10-CM

## 2023-08-02 MED ORDER — LATANOPROST 0.005 % OP SOLN
1.0000 [drp] | Freq: Every day | OPHTHALMIC | 3 refills | Status: DC
  Filled 2023-08-02: qty 7.5, 75d supply, fill #0

## 2023-08-02 MED ORDER — AMLODIPINE BESYLATE 10 MG PO TABS
10.0000 mg | ORAL_TABLET | Freq: Every morning | ORAL | 3 refills | Status: AC
  Filled 2023-08-02: qty 90, 90d supply, fill #0

## 2023-08-02 MED ORDER — TRULICITY 1.5 MG/0.5ML ~~LOC~~ SOAJ
1.5000 mg | SUBCUTANEOUS | 3 refills | Status: DC
Start: 2023-08-02 — End: 2024-08-16
  Filled 2023-08-02: qty 6, 84d supply, fill #0

## 2023-08-02 MED ORDER — BRIMONIDINE TARTRATE 0.2 % OP SOLN
1.0000 [drp] | Freq: Two times a day (BID) | OPHTHALMIC | 3 refills | Status: DC
  Filled 2023-08-02: qty 5, 50d supply, fill #0
  Filled 2023-08-03: qty 25, 250d supply, fill #0

## 2023-08-02 MED ORDER — TRESIBA FLEXTOUCH 100 UNIT/ML ~~LOC~~ SOPN
22.0000 [IU] | PEN_INJECTOR | Freq: Every day | SUBCUTANEOUS | 3 refills | Status: DC
Start: 2023-08-02 — End: 2024-08-16
  Filled 2023-08-02: qty 15, 68d supply, fill #0
  Filled 2023-10-11: qty 15, 68d supply, fill #1

## 2023-08-02 NOTE — Telephone Encounter (Signed)
Diagnosed with covid on Saturday, wants to know if she should take the 'vaccine' before she has her appointment on the 28th with pcp and where can she get it? I wonder if she means paxlovid?  Her refill requests are in his note and she also needs Calcium too but this might be over the counter.  Patient would like to be notified today if possible.

## 2023-08-02 NOTE — Telephone Encounter (Signed)
Forwarding to triage nurse to help with the vaccine/paxlovid question.

## 2023-08-03 ENCOUNTER — Other Ambulatory Visit: Payer: Self-pay

## 2023-08-03 ENCOUNTER — Other Ambulatory Visit: Payer: Self-pay | Admitting: Internal Medicine

## 2023-08-03 ENCOUNTER — Other Ambulatory Visit (HOSPITAL_COMMUNITY): Payer: Self-pay

## 2023-08-05 ENCOUNTER — Telehealth: Payer: Self-pay | Admitting: Dietician

## 2023-08-05 ENCOUNTER — Other Ambulatory Visit (HOSPITAL_COMMUNITY): Payer: Self-pay

## 2023-08-05 MED ORDER — IBUPROFEN 600 MG PO TABS
600.0000 mg | ORAL_TABLET | Freq: Four times a day (QID) | ORAL | 0 refills | Status: DC | PRN
  Filled 2023-08-05: qty 30, 8d supply, fill #0

## 2023-08-05 NOTE — Telephone Encounter (Signed)
Waiting to hear form [place] about her COVID diagnosis. She had diarrhea last night, still has more phlegm, a bit better, but not feeling well, should she test again, should she take the vaccine, what is her next step other than mask up once done isolating( today) . Is it okay for her to come to her appointment next week?

## 2023-08-09 ENCOUNTER — Ambulatory Visit
Admission: EM | Admit: 2023-08-09 | Discharge: 2023-08-09 | Disposition: A | Discharge status: Home / Self Care | Payer: 59 | Attending: Family Medicine | Admitting: Family Medicine

## 2023-08-09 ENCOUNTER — Encounter: Payer: Self-pay | Admitting: *Deleted

## 2023-08-09 ENCOUNTER — Ambulatory Visit: Payer: Self-pay | Admitting: *Deleted

## 2023-08-09 ENCOUNTER — Other Ambulatory Visit (HOSPITAL_COMMUNITY): Payer: Self-pay

## 2023-08-09 DIAGNOSIS — Z20822 Contact with and (suspected) exposure to covid-19: Secondary | ICD-10-CM | POA: Diagnosis not present

## 2023-08-09 DIAGNOSIS — J0110 Acute frontal sinusitis, unspecified: Secondary | ICD-10-CM

## 2023-08-09 MED ORDER — AMOXICILLIN-POT CLAVULANATE 875-125 MG PO TABS
1.0000 | ORAL_TABLET | Freq: Two times a day (BID) | ORAL | 0 refills | Status: DC
  Filled 2023-08-09: qty 14, 7d supply, fill #0

## 2023-08-09 NOTE — ED Triage Notes (Signed)
"  Recently + for COVID19 and I want to be tested again". "Headache, sore throat, decreased appetite and loss of taste with occasional chill still remains". + Test on 08-17.

## 2023-08-09 NOTE — Patient Instructions (Signed)
Visit Information  Thank you for taking time to visit with me today. Please don't hesitate to contact me if I can be of assistance to you.   Following are the goals we discussed today:   Goals Addressed               This Visit's Progress     Obtain Grief & Loss Counseling, Supportive Services & Resources. (pt-stated)   On track                                            Care Coordination Interventions:  Interventions Today     Flowsheet Row Most Recent Value  Chronic Disease    Chronic disease during today's visit Hypertension (HTN), Diabetes, Other  [Major Depression, Bereavement, Complex Grief & Tobacco Use Disorder]  General Interventions    General Interventions Discussed/Reviewed General Interventions Discussed, Labs, Vaccines, Doctor Visits, Health Screening, Annual Foot Exam, General Interventions Reviewed, Annual Eye Exam, Durable Medical Equipment (DME), Community Resources, Level of Care, Communication with  [Communication with Primary Care Provider]  Labs Hgb A1c every 3 months  [Encouraged]  Vaccines COVID-19, Flu, Shingles, RSV, Pneumonia, Tetanus/Pertussis/Diphtheria  [Encouraged]  Doctor Visits Discussed/Reviewed Doctor Visits Discussed, Doctor Visits Reviewed, Annual Wellness Visits, PCP, Specialist  [Encouraged]  Health Screening Bone Density, Colonoscopy, Mammogram  [Encouraged]  Durable Medical Equipment (DME) Glucomoter, BP Cuff, Other  [Cane]  PCP/Specialist Visits Compliance with follow-up visit  [Encouraged]  Communication with PCP/Specialists  [Encouraged]  Exercise Interventions    Exercise Discussed/Reviewed Exercise Discussed, Exercise Reviewed, Physical Activity, Weight Managment, Assistive device use and maintanence  [Encouraged]  Physical Activity Discussed/Reviewed Physical Activity Discussed, Physical Activity Reviewed, Types of exercise, Home Exercise Program (HEP)  [Encouraged]  Weight Management Weight maintenance  [Encouraged]  Education  Interventions    Education Provided Provided Therapist, sports, Provided Web-based Education, Provided Education  Provided Verbal Education On Nutrition, Mental Health/Coping with Illness, When to see the doctor, Foot Care, Eye Care, Labs, Blood Sugar Monitoring, Exercise, Medication, Development worker, community, Community Resources  [Encouraged]  Mental Health Interventions    Mental Health Discussed/Reviewed Mental Health Discussed, Anxiety, Depression, Mental Health Reviewed, Grief and Loss, Substance Abuse, Coping Strategies, Suicide, Other, Crisis  [Domestic Violence]  Nutrition Interventions    Nutrition Discussed/Reviewed Nutrition Discussed, Adding fruits and vegetables, Increaing proteins, Decreasing fats, Decreasing salt, Supplmental nutrition, Decreasing sugar intake, Portion sizes, Carbohydrate meal planning, Nutrition Reviewed, Fluid intake  [Encouraged]  Pharmacy Interventions    Pharmacy Dicussed/Reviewed Pharmacy Topics Discussed, Pharmacy Topics Reviewed, Medication Adherence, Affording Medications  [Encouraged]  Safety Interventions    Safety Discussed/Reviewed Safety Discussed, Safety Reviewed, Fall Risk  [Encouraged]  Advanced Directive Interventions    Advanced Directives Discussed/Reviewed Advanced Directives Discussed  [Encouraged Completion]     Active Listening & Reflection Utilized.  Verbalization of Feelings Encouraged.  Emotional Support Provided. Feelings of Grief & Loss Validated. Symptoms of Depression & Anxiety Acknowledged. Problem Solving Interventions Activated. Task-Centered Solutions Employed. Solution-Focused Strategies Implemented. Acceptance & Commitment Therapy Performed. Cognitive Behavioral Therapy Indicated. Client-Centered Therapy Initiated. Encouraged Administration of Medications, Exactly as Prescribed. Encouraged Increased Level of Activity & Exercise, as Tolerated. Encouraged Implementation of Deep Breathing Exercises, Relaxation Techniques, &  Mindfulness Meditation Strategies Daily. Encouraged Continued Contact with Medicaid Approved Dentists of Interest in [facility], from List Provided, in An Effort to Obtain Reputable, Reliable, Available, & Affordable Dental Services. Encouraged Continued  Contact with [employer] In-Network Preferred Dentists of Interest in [place], from List Provided, in An Effort to Obtain Reputable, Reliable, Available, & Armed forces technical officer. Encouraged Continued Contact with Medicaid Approved Audiologists of Interest in [facility], from List Provided, in An Effort to Obtain Reputable, Reliable, Available, & Affordable Hearing Services. Encouraged Continued Contact with Therapist, nutritional Preferred Audiology Providers of Interest in [place], from List Provided, in An Effort to Obtain Reputable, Reliable, Available, & Affordable Hearing Services. Encouraged Continued Contact with Home Modification/Repair [employer], Nurse, adult, Field seismologist of Interest in [place], from List Provided, in An Effort to [employer] with Chemical engineer, Increasing Water Pressure, Administrator, Civil Service, [employer], [employer] on [employer], [place]. Encouraged Continued Research officer, political party, Agencies, & Resources of Interest in [place], from List Provided, in An Effort to Obtain a Therapist, sports for Psychotropic Medication Administration & Geneticist, molecular for Psychotherapeutic Counseling & Supportive Services. Encouraged Continued Contact with Water quality scientist, Agencies, Resources, Occupational psychologist of Interest in [place], from List Provided, in An Effort to [employer], [employer] & [employer]. Encouraged Completion of Application Process for Obtaining Legal Guardianship of Spouse, Due to Memory Deficits. Encouraged Completion of Scientist, product/process development (Living Will & Healthcare  Power of Attorney Documents), & Submission to Dr. Francine Graven, Primary Care Provider with [facility] (# [phone]), to Scan into Electronic Medical Record in Epic. Encouraged Completion of Application Process for Obtaining Personal Care Services, through [facility] [phone]), & Submission of Application to Dr. Monna Fam, Primary Care Provider with [facility], [facility] [phone]), for Review & Signature. Encouraged Attendance at Follow-Up Appointment with Dr. Monna Fam, Primary Care Provider with [facility] [phone]), Scheduled on 08/11/2023 at 10:15 AM.  Encouraged Contact with CSW (# [phone]), if You Have Questions, Need Assistance, or If Additional Social Work Needs Are Identified Between Now & Our Next Scheduled Follow-Up Outreach Call.      Our next appointment is by telephone on 08/24/2023 at 2:00 pm.  Please call the care guide team at [phone] if you need to cancel or reschedule your appointment.   If you are experiencing a Mental Health or Behavioral Health Crisis or need someone to talk to, please call the Suicide and Crisis Lifeline: 988 call the [place] National Suicide Prevention Lifeline: 315 671 8746 or TTY: 641-545-2126 TTY 925 793 3111) to talk to a trained counselor call 1-800-273-TALK (toll free, 24 hour hotline) go to [facility] [address], [place] [phone]) call the [facility] Crisis Line: [phone] call 911  Patient verbalizes understanding of instructions and care plan provided today and agrees to view in MyChart. Active MyChart status and patient understanding of how to access instructions and care plan via MyChart confirmed with patient.     Telephone follow up appointment with care management team member scheduled for:  08/24/2023 at 2:00  pm.  Danford Bad, BSW, MSW, LCSW  Embedded Practice Social Work Case Manager  [facility], Population Health Direct Dial: [phone]  Fax: [phone] Email: [email or web address]

## 2023-08-09 NOTE — ED Provider Notes (Addendum)
[facility]    CSN: [id number] Arrival date & time: 08/09/23  1446      History   Chief Complaint Chief Complaint  Patient presents with   COVID19 Test    HPI Angie Cain is a 67 y.o. female.   Patient is here due to covid infection.  She went to the ER on 8/17 due to not feeling well, and was diagnosed with covid 19.   She continues to have a headache, feels like sinus, coughing up phlegm.  She has some fatigue.   She has a sinus headache above, below her eyes.  Only blows out a bit of phlegm, some PND.  She does not think she ever had fevers.  She wants to know "how far along she is in terms of covid".   She states she was out of several of her medications for a time, and not sure if that was related.  She has since restarted her medications as of Friday.          Past Medical History:  Diagnosis Date   Allergy    Anxiety    occasional   Arthritis    Atherosclerotic peripheral vascular disease with intermittent claudication (HCC)    s/p fem-fem bypass 2011. ABI 07/2012 0.5 R 0.65 L   Diabetes mellitus without complication (HCC)    GERD (gastroesophageal reflux disease)    Hyperlipidemia    Hypertension    IDDM (insulin dependent diabetes mellitus) 07/21/2007   July 2011: Femoral-femoral bypass  ABI 08/11/2012 notable for 0.4-0.59 on right ankle suggestive of severe arterial occlusive disease at rest and 0.60-0.7 on left ankle suggestive of moderate arterial occlusive disease at rest.    Iron deficiency anemia 05/08/2017   Low back pain 12/18/2013   Oral thrush 09/29/2022   Personal history of colonic polyps - adenomas 03/07/2014   Rotator cuff impingement syndrome of left shoulder 03/23/2016   Tobacco abuse     Patient Active Problem List   Diagnosis Date Noted   Bereavement 04/27/2023   Hypokalemia 04/27/2023   Herpes zoster without complication 02/03/2023   Glaucoma 11/18/2022   PAD (peripheral artery disease) (HCC) 11/18/2022    Postmenopausal 10/28/2022   Breast cancer screening by mammogram 10/08/2022   Bilateral hearing loss 10/08/2022   Medication nonadherence due to psychosocial problem 09/09/2022   Chronic bilateral inguinal dysesthesias, recurrent  03/18/2022   Hirsutism 02/12/2022   Contact dermatitis 02/12/2022   Diabetic neuropathy (HCC) 01/22/2022   Tinnitus 08/15/2019   Osteoarthritis of right hip 08/15/2019   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   Allergic rhinitis 10/16/2017   Major depression 10/16/2017   Healthcare maintenance 03/18/2017   Osteopenia determined by x-ray 01/25/2015   Bilateral plantar fasciitis 09/30/2014   Hx of adenomatous colonic polyps 03/07/2014   Low back pain 12/18/2013   GERD (gastroesophageal reflux disease) 06/06/2013   Hyperlipidemia associated with type 2 diabetes mellitus (HCC) 06/06/2013   Tobacco use disorder 07/21/2007   Essential hypertension 07/21/2007   Type 2 diabetes mellitus (HCC) 07/20/1993    Past Surgical History:  Procedure Laterality Date   ABDOMINAL AORTOGRAM W/LOWER EXTREMITY N/A 06/22/2023   Procedure: ABDOMINAL AORTOGRAM W/LOWER EXTREMITY;  Surgeon: Nada Libman, MD;  Location: [facility];  Service: Cardiovascular;  Laterality: N/A;   BREAST BIOPSY Left 02/08/2019   clip placement   VAGINAL HYSTERECTOMY     VEIN BYPASS SURGERY  06/13/2010   vein from left to right hip    OB History  No obstetric history on file.      Home Medications    Prior to Admission medications   Medication Sig Start Date End Date Taking? Authorizing Provider  amLODipine (NORVASC) 10 MG tablet Take 1 tablet (10 mg total) by mouth every morning. 08/02/23  Yes Marrianne Mood, MD  aspirin EC 81 MG tablet Take 81 mg by mouth daily.   Yes [provider]  atorvastatin (LIPITOR) 40 MG tablet Take 1 tablet (40 mg total) by mouth at bedtime. 01/19/23  Yes Steffanie Rainwater, MD  calcium-vitamin D (OSCAL WITH D) 500-200 MG-UNIT tablet Take 1  tablet by mouth 2 (two) times daily. 03/17/19  Yes Chundi, Vahini, MD  Dulaglutide (TRULICITY) 1.5 MG/0.5ML SOPN Inject 1.5 mg into the skin once a week. 08/02/23  Yes Marrianne Mood, MD  insulin degludec (TRESIBA FLEXTOUCH) 100 UNIT/ML FlexTouch Pen Inject 22 Units into the skin daily. 08/02/23  Yes Marrianne Mood, MD  latanoprost (XALATAN) 0.005 % ophthalmic solution PLACE 1 DROP INTO BOTH EYES AT BEDTIME. 06/27/21  Yes Evlyn Kanner, MD  lidocaine (LIDODERM) 5 % Place 1 patch onto the skin daily. Remove & Discard patch within 12 hours or as directed by MD 04/29/23  Yes Rocky Morel, DO  Accu-Chek FastClix Lancets MISC CHECK BLOOD SUGAR 3 TIMES A DAY 06/27/21   Evlyn Kanner, MD  Alcohol Swabs (B-D SINGLE USE SWABS REGULAR) PADS Use 1 pad 6 (six) times daily. 01/19/23   Steffanie Rainwater, MD  Blood Glucose Calibration (ACCU-CHEK AVIVA) SOLN Use to check controls on diabetic glucose meter monthly 02/26/20   Chundi, Sherlyn Lees, MD  Blood Glucose Monitoring Suppl (ACCU-CHEK AVIVA PLUS) w/Device KIT Check blood sugar up to 3 times a day 02/26/20   Lorenso Courier, MD  brimonidine (ALPHAGAN) 0.2 % ophthalmic solution Place 1 drop into the left eye 2 (two) times daily. 12/30/22     brimonidine (ALPHAGAN) 0.2 % ophthalmic solution Instill 1 drop into both eyes twice a day 08/02/23     cetirizine (ZYRTEC ALLERGY) 10 MG tablet Take 1 tablet (10 mg total) by mouth daily. 04/29/23   Rocky Morel, DO  Dapagliflozin Pro-metFORMIN ER (XIGDUO XR) 04-999 MG TB24 Take 1 tablet by mouth daily. 01/19/23   Steffanie Rainwater, MD  diclofenac Sodium (VOLTAREN) 1 % GEL Apply 2 g topically 4 (four) times daily. Patient not taking: Reported on 06/18/2023 06/12/21   Rehman, Areeg N, DO  famotidine (PEPCID) 20 MG tablet Take 1 tablet (20 mg total) by mouth at bedtime. Patient not taking: Reported on 06/18/2023 04/29/23   Rocky Morel, DO  glucose blood (ACCU-CHEK GUIDE) test strip Check blood sugar 3 times per day 07/07/21    Steffanie Rainwater, MD  ibuprofen (ADVIL) 600 MG tablet Take 1 tablet (600 mg total) by mouth every 6 (six) hours as needed. 08/05/23   Monna Fam, MD  Insulin Pen Needle 32G X 4 MM MISC Use to inject insulin once daily at bedtime 04/27/23   Rocky Morel, DO  latanoprost (XALATAN) 0.005 % ophthalmic solution Place 1 drop into both eyes at bedtime. 08/02/23     mupirocin ointment (BACTROBAN) 2 % Apply 1 Application topically daily. Patient not taking: Reported on 06/18/2023 04/29/23   Rocky Morel, DO    Family History Family History  Problem Relation Age of Onset   Diabetes Mother    Diabetes Father    Diabetes Sister    Hypertension Sister    Diabetes Brother    Hypertension Brother  Diabetes Son    Diabetes Brother    Diabetes Brother    Diabetes Brother    Diabetes Brother    Diabetes Brother    Colon cancer Neg Hx    Esophageal cancer Neg Hx    Stomach cancer Neg Hx    Rectal cancer Neg Hx    Breast cancer Neg Hx     Social History Social History   Tobacco Use   Smoking status: Every Day    Current packs/day: 0.25    Types: Cigarettes    Passive exposure: Current   Smokeless tobacco: Never   Tobacco comments:    Smokes 1 cig/day.    Smoking Cessation Classes, Services, Agencies & Resources Offered.  Vaping Use   Vaping status: Never Used  Substance Use Topics   Alcohol use: Yes    Alcohol/week: 1.0 - 2.0 standard drink of alcohol    Types: 1 - 2 Glasses of wine per week    Comment: weekly   Drug use: No     Allergies   Bupropion, Jardiance [empagliflozin], Metronidazole, Shellfish allergy, and Shrimp extract   Review of Systems Review of Systems  Constitutional:  Positive for fatigue.  HENT:  Positive for congestion and rhinorrhea.   Respiratory: Negative.    Cardiovascular: Negative.   Gastrointestinal:  Positive for nausea.  Musculoskeletal: Negative.   Psychiatric/Behavioral: Negative.       Physical Exam Triage Vital Signs ED Triage  Vitals  Encounter Vitals Group     BP 08/09/23 1454 125/71     Systolic BP Percentile --      Diastolic BP Percentile --      Pulse Rate 08/09/23 1454 78     Resp 08/09/23 1454 20     Temp 08/09/23 1454 97.8 F (36.6 C)     Temp Source 08/09/23 1454 Oral     SpO2 08/09/23 1454 96 %     Weight 08/09/23 1452 115 lb (52.2 kg)     Height 08/09/23 1452 5\' 5"  (1.651 m)     Head Circumference --      Peak Flow --      Pain Score 08/09/23 1448 0     Pain Loc --      Pain Education --      Exclude from Growth Chart --    No data found.  Updated Vital Signs BP 125/71 (BP Location: Right Arm)   Pulse 78   Temp 97.8 F (36.6 C) (Oral)   Resp 20   Ht 5\' 5"  (1.651 m)   Wt 52.2 kg   SpO2 96%   BMI 19.14 kg/m   Visual Acuity Right Eye Distance:   Left Eye Distance:   Bilateral Distance:    Right Eye Near:   Left Eye Near:    Bilateral Near:     Physical Exam Constitutional:      Appearance: Normal appearance.  HENT:     Nose: Congestion present.     Right Sinus: Maxillary sinus tenderness and frontal sinus tenderness present.     Left Sinus: Maxillary sinus tenderness and frontal sinus tenderness present.     Mouth/Throat:     Mouth: Mucous membranes are moist.  Cardiovascular:     Rate and Rhythm: Normal rate and regular rhythm.  Pulmonary:     Effort: Pulmonary effort is normal.     Breath sounds: Normal breath sounds.  Musculoskeletal:     Cervical back: Normal range of motion and neck supple.  Neurological:  General: No focal deficit present.     Mental Status: She is alert.  Psychiatric:        Mood and Affect: Mood normal.      UC Treatments / Results  Labs (all labs ordered are listed, but only abnormal results are displayed)   EKG   Radiology No results found.  Procedures Procedures (including critical care time)  Medications Ordered in UC Medications - No data to display  Initial Impression / Assessment and Plan / UC Course  I have  reviewed the triage vital signs and the nursing notes.  Pertinent labs & imaging results that were available during my care of the patient were reviewed by me and considered in my medical decision making (see chart for details).   Final Clinical Impressions(s) / UC Diagnoses   Final diagnoses:  Exposure to COVID-19 virus  Acute non-recurrent frontal sinusitis     Discharge Instructions      You were seen today for not feeling well due to covid 19.  As discussed, it may take a while to feel back to normal for some time.  However, I am treating you for a sinus infection today with augmentin twice/day x 7 days.  Please get plenty of rest and fluids.  You may return if not feeling well, and follow up with your primary care provider.     ED Prescriptions     Medication Sig Dispense Auth. Provider   amoxicillin-clavulanate (AUGMENTIN) 875-125 MG tablet Take 1 tablet by mouth every 12 (twelve) hours. 14 tablet Jannifer Franklin, MD      PDMP not reviewed this encounter.   Jannifer Franklin, MD 08/09/23 1547    Jannifer Franklin, MD 08/09/23 [phone]

## 2023-08-09 NOTE — Discharge Instructions (Signed)
You were seen today for not feeling well due to covid 19.  As discussed, it may take a while to feel back to normal for some time.  However, I am treating you for a sinus infection today with augmentin twice/day x 7 days.  Please get plenty of rest and fluids.  You may return if not feeling well, and follow up with your primary care provider.
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
## 2023-08-10 ENCOUNTER — Other Ambulatory Visit (HOSPITAL_COMMUNITY): Payer: Self-pay

## 2023-08-10 NOTE — Telephone Encounter (Addendum)
Return pt's call. Stated she went to UC yesterday - prescribed an abx (she has not started taking yet). She has productive cough (taking Mucinex). No fever. Dx covid on 8/17. Stated she was not re-tested at [facility]. Stated she will call back tomorrow am to re-schedule her appt .

## 2023-08-11 ENCOUNTER — Encounter: Payer: 59 | Admitting: Internal Medicine

## 2023-08-24 ENCOUNTER — Encounter: Payer: Self-pay | Admitting: *Deleted

## 2023-08-24 ENCOUNTER — Ambulatory Visit: Payer: Self-pay | Admitting: *Deleted
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
## 2023-08-30 ENCOUNTER — Other Ambulatory Visit (HOSPITAL_COMMUNITY): Payer: Self-pay

## 2023-08-30 MED ORDER — BRIMONIDINE TARTRATE 0.2 % OP SOLN
1.0000 [drp] | Freq: Two times a day (BID) | OPHTHALMIC | 3 refills | Status: DC
  Filled 2023-08-30: qty 20, 100d supply, fill #0

## 2023-08-31 ENCOUNTER — Other Ambulatory Visit (HOSPITAL_COMMUNITY): Payer: Self-pay

## 2023-08-31 MED ORDER — ACCU-CHEK FASTCLIX LANCETS MISC
5 refills | Status: AC
  Filled 2023-08-31: qty 100, 90d supply, fill #0

## 2023-08-31 MED ORDER — ACCU-CHEK GUIDE ME W/DEVICE KIT
PACK | 1 refills | Status: AC
  Filled 2023-08-31: qty 1, 28d supply, fill #0

## 2023-08-31 MED ORDER — ACCU-CHEK GUIDE VI STRP
ORAL_STRIP | 3 refills | Status: DC
  Filled 2023-08-31: qty 100, 90d supply, fill #0

## 2023-09-01 ENCOUNTER — Other Ambulatory Visit (HOSPITAL_COMMUNITY): Payer: Self-pay

## 2023-09-08 ENCOUNTER — Ambulatory Visit: Payer: Self-pay | Admitting: *Deleted
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
## 2023-09-15 ENCOUNTER — Other Ambulatory Visit (HOSPITAL_COMMUNITY): Payer: Self-pay

## 2023-09-15 MED ORDER — DAPAGLIFLOZIN PRO-METFORMIN ER 5-1000 MG PO TB24
1.0000 | ORAL_TABLET | Freq: Every day | ORAL | 3 refills | Status: AC
  Filled 2023-09-15 – 2024-04-10 (×2): qty 90, 90d supply, fill #0

## 2023-09-16 ENCOUNTER — Other Ambulatory Visit (HOSPITAL_COMMUNITY): Payer: Self-pay

## 2023-09-28 ENCOUNTER — Other Ambulatory Visit (HOSPITAL_COMMUNITY): Payer: Self-pay

## 2023-10-11 ENCOUNTER — Ambulatory Visit: Payer: Self-pay | Admitting: *Deleted

## 2023-10-11 ENCOUNTER — Other Ambulatory Visit (HOSPITAL_COMMUNITY): Payer: Self-pay

## 2023-10-11 NOTE — Patient Outreach (Signed)
  Care Coordination   Follow Up Visit Note   10/11/2023  Name: Angie Cain MRN: [id number] DOB: [birth year]  Angie Cain is a 67 y.o. year old female who sees [name], NP for primary care. I spoke with Angie Cain by phone today.  What matters to the patients health and wellness today?  Obtain Grief & Loss Counseling, Supportive Services & Resources.   Goals Addressed               This Visit's Progress     Obtain Grief & Loss Counseling, Supportive Services & Resources. (pt-stated)   On track     Care Coordination Interventions:  Interventions Today    Flowsheet Row Most Recent Value  Chronic Disease   Chronic disease during today's visit Diabetes, Hypertension (HTN), Other  [Major Depression, Bereavement, Complex Grief & Tobacco Use Disorder]  General Interventions   General Interventions Discussed/Reviewed General Interventions Discussed, General Interventions Reviewed, Doctor Visits, Communication with, [employer], [employer] Exam, Labs, Programme researcher, broadcasting/film/video Exam, Health Screening, Vaccines  [Communication with Care Team Members]  Labs Hgb A1c every 3 months, Kidney Function  Vaccines COVID-19, Flu, Pneumonia, RSV, Shingles, Tetanus/Pertussis/Diphtheria  [Encouraged]  Doctor Visits Discussed/Reviewed Doctor Visits Discussed, Doctor Visits Reviewed, Annual Wellness Visits, PCP, Specialist  Durable Medical Equipment (DME) Glucomoter, BP Cuff, Other  [Eyeglasses]  PCP/Specialist Visits Compliance with follow-up visit  Communication with PCP/Specialists, RN, Pharmacists  Exercise Interventions   Exercise Discussed/Reviewed Exercise Discussed, Exercise Reviewed, Physical Activity, Weight Managment  Physical Activity Discussed/Reviewed Physical Activity Discussed, Physical Activity Reviewed, Home Exercise Program (HEP), Types of exercise, Gym, PREP  Weight Management Weight maintenance  Education Interventions   Education Provided Provided Development worker, international aid, Provided Education  Provided Verbal Education On Nutrition, Mental Health/Coping with Illness, When to see the doctor, Foot Care, Eye Care, Labs, Blood Sugar Monitoring, Applications, Exercise, Medication, Community Resources  Labs Reviewed Hgb A1c  Mental Health Interventions   Mental Health Discussed/Reviewed Mental Health Discussed, Anxiety, Depression, Mental Health Reviewed, Grief and Loss, Substance Abuse, Coping Strategies, Suicide, Crisis  Nutrition Interventions   Nutrition Discussed/Reviewed Nutrition Discussed, Adding fruits and vegetables, Increasing proteins, Nutrition Reviewed, Carbohydrate meal planning, Supplemental nutrition, Decreasing sugar intake, Portion sizes, Decreasing salt, Decreasing fats, Fluid intake  Pharmacy Interventions   Pharmacy Dicussed/Reviewed Pharmacy Topics Discussed, Medications and their functions, Pharmacy Topics Reviewed, Medication Adherence, Affording Medications  Safety Interventions   Safety Discussed/Reviewed Safety Discussed, Safety Reviewed  Advanced Directive Interventions   Advanced Directives Discussed/Reviewed Advanced Directives Discussed      Active Listening & Reflection Utilized.  Verbalization of Feelings Encouraged.  Emotional Support Provided. Problem Solving Interventions Employed. Solution-Focused Strategies Activated. Acceptance & Commitment Therapy Initiated. Cognitive Behavioral Therapy Implemented. Client-Centered Therapy Performed. Encouraged Contact with CSW (# [phone]), if You Have Questions, Need Assistance, or If Additional Social Work Needs Are Identified Between Now & Our Next Follow-Up Outreach Call, Scheduled on 10/25/2023 at 2:45 PM.      SDOH assessments and interventions completed:  Yes.  Care Coordination Interventions:  Yes, provided.   Follow up plan: Follow up call scheduled for 10/25/2023 at 2:45 pm.  Encounter Outcome:  Patient Visit Completed.   Danford Bad, BSW, MSW, Optometrist Social Work Case Set designer Health  [facility], Population Health Direct Dial: [phone]  Fax: [phone] Email: [email or web address]: [email or web address]

## 2023-10-11 NOTE — Patient Instructions (Signed)
Visit Information  Thank you for taking time to visit with me today. Please don't hesitate to contact me if I can be of assistance to you.   Following are the goals we discussed today:   Goals Addressed               This Visit's Progress     Obtain Grief & Loss Counseling, Supportive Services & Resources. (pt-stated)   On track     Care Coordination Interventions:  Interventions Today    Flowsheet Row Most Recent Value  Chronic Disease   Chronic disease during today's visit Diabetes, Hypertension (HTN), Other  [Major Depression, Bereavement, Complex Grief & Tobacco Use Disorder]  General Interventions   General Interventions Discussed/Reviewed General Interventions Discussed, General Interventions Reviewed, Doctor Visits, Communication with, [employer], [employer] Exam, Labs, Programme researcher, broadcasting/film/video Exam, Health Screening, Vaccines  [Communication with Care Team Members]  Labs Hgb A1c every 3 months, Kidney Function  Vaccines COVID-19, Flu, Pneumonia, RSV, Shingles, Tetanus/Pertussis/Diphtheria  [Encouraged]  Doctor Visits Discussed/Reviewed Doctor Visits Discussed, Doctor Visits Reviewed, Annual Wellness Visits, PCP, Specialist  Durable Medical Equipment (DME) Glucomoter, BP Cuff, Other  [Eyeglasses]  PCP/Specialist Visits Compliance with follow-up visit  Communication with PCP/Specialists, RN, Pharmacists  Exercise Interventions   Exercise Discussed/Reviewed Exercise Discussed, Exercise Reviewed, Physical Activity, Weight Managment  Physical Activity Discussed/Reviewed Physical Activity Discussed, Physical Activity Reviewed, Home Exercise Program (HEP), Types of exercise, Gym, PREP  Weight Management Weight maintenance  Education Interventions   Education Provided Provided Therapist, sports, Provided Education  Provided Verbal Education On Nutrition, Mental Health/Coping with Illness, When to see the doctor, Foot Care, Eye Care, Labs, Blood Sugar Monitoring, Applications, Exercise,  Medication, Community Resources  Labs Reviewed Hgb A1c  Mental Health Interventions   Mental Health Discussed/Reviewed Mental Health Discussed, Anxiety, Depression, Mental Health Reviewed, Grief and Loss, Substance Abuse, Coping Strategies, Suicide, Crisis  Nutrition Interventions   Nutrition Discussed/Reviewed Nutrition Discussed, Adding fruits and vegetables, Increasing proteins, Nutrition Reviewed, Carbohydrate meal planning, Supplemental nutrition, Decreasing sugar intake, Portion sizes, Decreasing salt, Decreasing fats, Fluid intake  Pharmacy Interventions   Pharmacy Dicussed/Reviewed Pharmacy Topics Discussed, Medications and their functions, Pharmacy Topics Reviewed, Medication Adherence, Affording Medications  Safety Interventions   Safety Discussed/Reviewed Safety Discussed, Safety Reviewed  Advanced Directive Interventions   Advanced Directives Discussed/Reviewed Advanced Directives Discussed      Active Listening & Reflection Utilized.  Verbalization of Feelings Encouraged.  Emotional Support Provided. Problem Solving Interventions Employed. Solution-Focused Strategies Activated. Acceptance & Commitment Therapy Initiated. Cognitive Behavioral Therapy Implemented. Client-Centered Therapy Performed. Encouraged Contact with CSW (# [phone]), if You Have Questions, Need Assistance, or If Additional Social Work Needs Are Identified Between Now & Our Next Follow-Up Outreach Call, Scheduled on 10/25/2023 at 2:45 PM.      Our next appointment is by telephone on 10/25/2023 at 2:45 pm.  Please call the care guide team at [phone] if you need to cancel or reschedule your appointment.   If you are experiencing a Mental Health or Behavioral Health Crisis or need someone to talk to, please call the Suicide and Crisis Lifeline: 988 call the [place] National Suicide Prevention Lifeline: 680-349-0311 or TTY: (743)465-7408 TTY 325-665-5813) to talk to a trained counselor call  1-800-273-TALK (toll free, 24 hour hotline) go to [facility] [address], [place] [phone]) call the [facility] Crisis Line: [phone] call 911  Patient verbalizes understanding of instructions and care plan provided today and agrees to view in MyChart. Active MyChart  status and patient understanding of how to access instructions and care plan via MyChart confirmed with patient.     Telephone follow up appointment with care management team member scheduled for:  10/25/2023 at 2:45 pm.  Danford Bad, BSW, MSW, LCSW  Embedded Practice Social Work Case Manager  [facility], Population Health Direct Dial: [phone]  Fax: [phone] Email: [name]

## 2023-10-15 ENCOUNTER — Other Ambulatory Visit (HOSPITAL_COMMUNITY): Payer: Self-pay

## 2023-10-18 ENCOUNTER — Other Ambulatory Visit (HOSPITAL_COMMUNITY): Payer: Self-pay

## 2023-10-25 ENCOUNTER — Encounter: Payer: Self-pay | Admitting: *Deleted

## 2023-10-25 ENCOUNTER — Ambulatory Visit: Payer: Self-pay | Admitting: *Deleted

## 2023-10-25 NOTE — Patient Instructions (Signed)
Visit Information  Thank you for taking time to visit with me today. Please don't hesitate to contact me if I can be of assistance to you.   Following are the goals we discussed today:   Goals Addressed               This Visit's Progress     Obtain Grief & Loss Counseling, Supportive Services & Resources. (pt-stated)   On track     Care Coordination Interventions:  Interventions Today    Flowsheet Row Most Recent Value  Chronic Disease   Chronic disease during today's visit Diabetes, Hypertension (HTN), Other  [Major Depression, Bereavement, Complex Grief, Tobacco Use Disorder, Marital Discord, Bilateral Hearing Loss, Diabetic Neuropathy, Chronic Low Back Pain & Glaucoma]  General Interventions   General Interventions Discussed/Reviewed General Interventions Discussed, Labs, Vaccines, Doctor Visits, Annual Foot Exam, Health Screening, General Interventions Reviewed, Annual Eye Exam, Durable Medical Equipment (DME), Community Resources, Communication with  [Communication with Care Team Members]  Labs Hgb A1c every 3 months, Hgb A1c annually  [Encouraged]  Vaccines COVID-19, Flu, Pneumonia, RSV, Shingles, Tetanus/Pertussis/Diphtheria  [Encouraged]  Doctor Visits Discussed/Reviewed Doctor Visits Discussed, Specialist, Doctor Visits Reviewed, Annual Wellness Visits, PCP  [Encouraged Routine Engagement]  Health Screening Bone Density, Colonoscopy, Mammogram  [Encouraged]  Durable Medical Equipment (DME) Bed side commode, BP Cuff, Glucomoter, Environmental consultant, Other  [Prescription Eyeglasses, Reacher, Manufacturing systems engineer, Cane]  PCP/Specialist Visits Compliance with follow-up visit  [Encouraged]  Communication with PCP/Specialists, Charity fundraiser, Pharmacists, Social Work  [employer] Routine Engagement]  Exercise Interventions   Exercise Discussed/Reviewed Exercise Discussed, Assistive device use and maintanence, Exercise Reviewed, Physical Activity, Weight Managment  [Encouraged]  Physical Activity  Discussed/Reviewed Physical Activity Discussed, Home Exercise Program (HEP), PREP, Gym, Physical Activity Reviewed, Types of exercise  [Encouraged]  Weight Management Weight maintenance  [Encouraged]  Education Interventions   Education Provided Provided Education  Provided Verbal Education On Nutrition, Mental Health/Coping with Illness, When to see the doctor, Foot Care, Eye Care, Labs, Blood Sugar Monitoring, Exercise, Medication, [employer], [employer]  Labs Reviewed Hgb A1c  Mental Health Interventions   Mental Health Discussed/Reviewed Mental Health Discussed, Anxiety, Depression, Grief and Loss, Mental Health Reviewed, Substance Abuse, Coping Strategies, Suicide, Crisis  [Assessed]  Nutrition Interventions   Nutrition Discussed/Reviewed Increasing proteins, Adding fruits and vegetables, Nutrition Discussed, Nutrition Reviewed, Fluid intake, Decreasing fats, Decreasing salt, Portion sizes, Carbohydrate meal planning, Decreasing sugar intake, Supplemental nutrition  [Encouraged]  Pharmacy Interventions   Pharmacy Dicussed/Reviewed Pharmacy Topics Discussed, Medications and their functions, Medication Adherence, Pharmacy Topics Reviewed, Affording Medications  [Encouraged]  Medication Adherence --  [Medication Compliant]  Safety Interventions   Safety Discussed/Reviewed Safety Discussed, Safety Reviewed, Fall Risk  [Encouraged Routine Use of Assistive Devices]  Advanced Directive Interventions   Advanced Directives Discussed/Reviewed Advanced Directives Discussed, Advanced Directives Reviewed  [Advanced Directives Completed & Encouraged to Provide Copy to Primary Care Provider to Scan into Electronic Medical Record]      Active Listening & Reflection Utilized.  Verbalization of Feelings Encouraged.  Emotional Support Provided. Problem Solving Interventions Employed. Solution-Focused Strategies Activated. Acceptance & Commitment Therapy Initiated. Cognitive Behavioral  Therapy Implemented. Client-Centered Therapy Performed. Encouraged Self-Enrollment in Grief & Loss Support Group of Interest in [facility], from List Provided, to [employer], Cytogeneticist, Sales promotion account executive, Education, Resources, Etc., in An Effort to Reduce & Manage Symptoms of Depression, Grief & Loss. Encouraged Intake of Glucerna Nutritional Supplements Three Times Per Day. Confirmed Initiation of Advanced Directives (Living Will & Healthcare Power of  [place] Friends Documents), Appointing Son & Daughter-in-Law Healthcare Powers' of [place]. Encouraged Submission of Completed, Signed & Notarized Advanced Directives (Living Will & Healthcare Power of Attorney Documents) to Debria Garret, Primary Care Provider & Nurse Practitioner with [facility] [phone]), to Scan into Electronic Medical Record in Epic. Encouraged Contact with CSW (# [phone]), if You Have Questions, Need Assistance, or If Additional Social Work Needs Are Identified Between Now & Our Next Follow-Up Outreach Call, Scheduled on 11/09/2023 at 10:15 AM.      Our next appointment is by telephone on 11/09/2023 at 10:15 am.  Please call the care guide team at [phone] if you need to cancel or reschedule your appointment.   If you are experiencing a Mental Health or Behavioral Health Crisis or need someone to talk to, please call the Suicide and Crisis Lifeline: 988 call the [place] National Suicide Prevention Lifeline: 423-164-0512 or TTY: 231-601-2862 TTY 231-792-2683) to talk to a trained counselor call 1-800-273-TALK (toll free, 24 hour hotline) go to [facility] [address] [place] [phone]) call the [facility] Crisis Line: [phone] call 911  Patient verbalizes understanding of instructions and care plan provided today and agrees to view in MyChart. Active MyChart status and patient understanding of how to access instructions and care plan via  MyChart confirmed with patient.     Telephone follow up appointment with care management team member scheduled for:  11/09/2023 at 10:15 am.  Danford Bad, BSW, MSW, LCSW  Embedded Practice Social Work Case Manager  [facility], Population Health Direct Dial: [phone]  Fax: [phone] Email: [email or web address] [email or web address]

## 2023-10-25 NOTE — Patient Outreach (Signed)
Care Coordination   Follow Up Visit Note   10/25/2023  Name: Surie Smid MRN: [id number] DOB: 02-Jul-1956  Chareese Veth is a 67 y.o. year old female who sees [name], NP for primary care. I spoke with [name] by phone today.  What matters to the patients health and wellness today?  Obtain Grief & Loss Counseling, Supportive Services & Resources.    Goals Addressed               This Visit's Progress     Obtain Grief & Loss Counseling, Supportive Services & Resources. (pt-stated)   On track     Care Coordination Interventions:  Interventions Today    Flowsheet Row Most Recent Value  Chronic Disease   Chronic disease during today's visit Diabetes, Hypertension (HTN), Other  [Major Depression, Bereavement, Complex Grief, Tobacco Use Disorder, Marital Discord, Bilateral Hearing Loss, Diabetic Neuropathy, Chronic Low Back Pain & Glaucoma]  General Interventions   General Interventions Discussed/Reviewed General Interventions Discussed, Labs, Vaccines, Doctor Visits, Annual Foot Exam, Health Screening, General Interventions Reviewed, Annual Eye Exam, Durable Medical Equipment (DME), Community Resources, Communication with  [Communication with Care Team Members]  Labs Hgb A1c every 3 months, Hgb A1c annually  [Encouraged]  Vaccines COVID-19, Flu, Pneumonia, RSV, Shingles, Tetanus/Pertussis/Diphtheria  [Encouraged]  Doctor Visits Discussed/Reviewed Doctor Visits Discussed, Specialist, Doctor Visits Reviewed, Annual Wellness Visits, PCP  [Encouraged Routine Engagement]  Health Screening Bone Density, Colonoscopy, Mammogram  [Encouraged]  Durable Medical Equipment (DME) Bed side commode, BP Cuff, Glucomoter, Environmental consultant, Other  [Prescription Eyeglasses, Reacher, Manufacturing systems engineer, Cane]  PCP/Specialist Visits Compliance with follow-up visit  [Encouraged]  Communication with PCP/Specialists, Charity fundraiser, Pharmacists, Social Work  [employer] Routine Engagement]  Exercise  Interventions   Exercise Discussed/Reviewed Exercise Discussed, Assistive device use and maintanence, Exercise Reviewed, Physical Activity, Weight Managment  [Encouraged]  Physical Activity Discussed/Reviewed Physical Activity Discussed, Home Exercise Program (HEP), PREP, Gym, Physical Activity Reviewed, Types of exercise  [Encouraged]  Weight Management Weight maintenance  [Encouraged]  Education Interventions   Education Provided Provided Education  Provided Verbal Education On Nutrition, Mental Health/Coping with Illness, When to see the doctor, Foot Care, Eye Care, Labs, Blood Sugar Monitoring, Exercise, Medication, [employer], [employer]  Labs Reviewed Hgb A1c  Mental Health Interventions   Mental Health Discussed/Reviewed Mental Health Discussed, Anxiety, Depression, Grief and Loss, Mental Health Reviewed, Substance Abuse, Coping Strategies, Suicide, Crisis  [Assessed]  Nutrition Interventions   Nutrition Discussed/Reviewed Increasing proteins, Adding fruits and vegetables, Nutrition Discussed, Nutrition Reviewed, Fluid intake, Decreasing fats, Decreasing salt, Portion sizes, Carbohydrate meal planning, Decreasing sugar intake, Supplemental nutrition  [Encouraged]  Pharmacy Interventions   Pharmacy Dicussed/Reviewed Pharmacy Topics Discussed, Medications and their functions, Medication Adherence, Pharmacy Topics Reviewed, Affording Medications  [Encouraged]  Medication Adherence --  [Medication Compliant]  Safety Interventions   Safety Discussed/Reviewed Safety Discussed, Safety Reviewed, Fall Risk  [Encouraged Routine Use of Assistive Devices]  Advanced Directive Interventions   Advanced Directives Discussed/Reviewed Advanced Directives Discussed, Advanced Directives Reviewed  [Advanced Directives Completed & Encouraged to Provide Copy to Primary Care Provider to Scan into Electronic Medical Record]      Active Listening & Reflection Utilized.  Verbalization of Feelings  Encouraged.  Emotional Support Provided. Problem Solving Interventions Employed. Solution-Focused Strategies Activated. Acceptance & Commitment Therapy Initiated. Cognitive Behavioral Therapy Implemented. Client-Centered Therapy Performed. Encouraged Self-Enrollment in Grief & Loss Support Group of Interest in [facility], from List Provided, to [employer], Cytogeneticist, Sales promotion account executive, Education, Resources, Etc., in An Effort  to Reduce & Manage Symptoms of Depression, Grief & Loss. Encouraged Intake of Glucerna Nutritional Supplements Three Times Per Day. Confirmed Initiation of Advanced Directives (Living Will & Healthcare Power of [address] Documents), Appointing Son & Daughter-in-Law Healthcare Powers' of [place]. Encouraged Submission of Completed, Signed & Notarized Advanced Directives (Living Will & Healthcare Power of Attorney Documents) to Debria Garret, Primary Care Provider & Nurse Practitioner with [facility] [phone]), to Scan into Electronic Medical Record in Epic. Encouraged Contact with CSW (# [phone]), if You Have Questions, Need Assistance, or If Additional Social Work Needs Are Identified Between Now & Our Next Follow-Up Outreach Call, Scheduled on 11/09/2023 at 10:15 AM.        SDOH assessments and interventions completed:  Yes.  SDOH Interventions Today    Flowsheet Row Most Recent Value  SDOH Interventions   Food Insecurity Interventions Intervention Not Indicated  Housing Interventions Intervention Not Indicated  Transportation Interventions Intervention Not Indicated, Patient Resources (Friends/Family)  Utilities Interventions Intervention Not Indicated  Alcohol Usage Interventions Intervention Not Indicated (Score <7)  Depression Interventions/Treatment  Referral to Psychiatry, Medication, Counseling, Currently on Treatment  Financial Strain Interventions Intervention Not Indicated  Physical Activity Interventions Patient Declined   Stress Interventions Intervention Not Indicated  Social Connections Interventions Intervention Not Indicated  Health Literacy Interventions Intervention Not Indicated     Care Coordination Interventions:  Yes, provided.   Follow up plan: Follow up call scheduled for 11/09/2023 at 10:15 am.  Encounter Outcome:  Patient Visit Completed.   Danford Bad, BSW, MSW, Printmaker Social Work Case Set designer Health  [facility], Population Health Direct Dial: [phone]  Fax: [phone] Email: [email or web address]: [email or web address]

## 2023-11-02 ENCOUNTER — Other Ambulatory Visit (HOSPITAL_COMMUNITY): Payer: Self-pay

## 2023-11-02 ENCOUNTER — Other Ambulatory Visit: Payer: Self-pay

## 2023-11-02 MED ORDER — ACCU-CHEK GUIDE TEST VI STRP
ORAL_STRIP | 2 refills | Status: DC
  Filled 2023-11-02: qty 100, 100d supply, fill #0

## 2023-11-02 MED ORDER — ACCU-CHEK GUIDE TEST VI STRP
ORAL_STRIP | 10 refills | Status: DC
  Filled 2023-11-02: qty 100, 90d supply, fill #0
  Filled 2023-12-27: qty 100, 33d supply, fill #0
  Filled 2024-03-30: qty 100, 33d supply, fill #1

## 2023-11-03 ENCOUNTER — Other Ambulatory Visit (HOSPITAL_BASED_OUTPATIENT_CLINIC_OR_DEPARTMENT_OTHER): Payer: Self-pay

## 2023-11-09 ENCOUNTER — Telehealth: Payer: Self-pay | Admitting: *Deleted

## 2023-11-09 ENCOUNTER — Other Ambulatory Visit: Payer: Self-pay

## 2023-11-09 ENCOUNTER — Other Ambulatory Visit (HOSPITAL_COMMUNITY): Payer: Self-pay

## 2023-11-09 ENCOUNTER — Ambulatory Visit: Payer: Self-pay | Admitting: *Deleted

## 2023-11-09 MED ORDER — METRONIDAZOLE 0.75 % VA GEL
1.0000 | Freq: Every evening | VAGINAL | 0 refills | Status: AC
  Filled 2023-11-09: qty 70, 7d supply, fill #0

## 2023-11-09 MED ORDER — MIRTAZAPINE 15 MG PO TABS
15.0000 mg | ORAL_TABLET | Freq: Every day | ORAL | 3 refills | Status: DC
  Filled 2023-11-09: qty 90, 90d supply, fill #0

## 2023-11-09 NOTE — Patient Instructions (Signed)
Visit Information  Thank you for taking time to visit with me today. Please don't hesitate to contact me if I can be of assistance to you.   Following are the goals we discussed today:   Goals Addressed               This Visit's Progress     Obtain Grief & Loss Counseling, Supportive Services & Resources. (pt-stated)   On track     Care Coordination Interventions:  Interventions Today    Flowsheet Row Most Recent Value  Chronic Disease   Chronic disease during today's visit Diabetes, Hypertension (HTN), Other  [Major Depression, Bereavement, Complex Grief, Tobacco Use Disorder, Marital Discord, Bilateral Hearing Loss, Diabetic Neuropathy, Chronic Low Back Pain & Glaucoma]  General Interventions   General Interventions Discussed/Reviewed General Interventions Discussed, Labs, Vaccines, Doctor Visits, Annual Foot Exam, Health Screening, General Interventions Reviewed, Annual Eye Exam, Durable Medical Equipment (DME), Community Resources, Communication with  [Communication with Care Team Members]  Labs Hgb A1c every 3 months, Hgb A1c annually  [Encouraged]  Vaccines COVID-19, Flu, Pneumonia, RSV, Shingles, Tetanus/Pertussis/Diphtheria  [Encouraged]  Doctor Visits Discussed/Reviewed Doctor Visits Discussed, Specialist, Doctor Visits Reviewed, Annual Wellness Visits, PCP  [Encouraged Routine Engagement]  Health Screening Bone Density, Colonoscopy, Mammogram  [Encouraged]  Durable Medical Equipment (DME) Bed side commode, BP Cuff, Glucomoter, Environmental consultant, Other  [Prescription Eyeglasses, Reacher, Manufacturing systems engineer, Cane]  PCP/Specialist Visits Compliance with follow-up visit  [Encouraged]  Communication with PCP/Specialists, Charity fundraiser, Pharmacists, Social Work  [employer] Routine Engagement]  Exercise Interventions   Exercise Discussed/Reviewed Exercise Discussed, Assistive device use and maintanence, Exercise Reviewed, Physical Activity, Weight Managment  [Encouraged]  Physical Activity  Discussed/Reviewed Physical Activity Discussed, Home Exercise Program (HEP), PREP, Gym, Physical Activity Reviewed, Types of exercise  [Encouraged]  Weight Management Weight maintenance  [Encouraged]  Education Interventions   Education Provided Provided Education  Provided Verbal Education On Nutrition, Mental Health/Coping with Illness, When to see the doctor, Foot Care, Eye Care, Labs, Blood Sugar Monitoring, Exercise, Medication, [employer], [employer]  Labs Reviewed Hgb A1c  Mental Health Interventions   Mental Health Discussed/Reviewed Mental Health Discussed, Anxiety, Depression, Grief and Loss, Mental Health Reviewed, Substance Abuse, Coping Strategies, Suicide, Crisis  [Assessed]  Nutrition Interventions   Nutrition Discussed/Reviewed Increasing proteins, Adding fruits and vegetables, Nutrition Discussed, Nutrition Reviewed, Fluid intake, Decreasing fats, Decreasing salt, Portion sizes, Carbohydrate meal planning, Decreasing sugar intake, Supplemental nutrition  [Encouraged]  Pharmacy Interventions   Pharmacy Dicussed/Reviewed Pharmacy Topics Discussed, Medications and their functions, Medication Adherence, Pharmacy Topics Reviewed, Affording Medications  [Encouraged]  Medication Adherence --  [Medication Compliant]  Safety Interventions   Safety Discussed/Reviewed Safety Discussed, Safety Reviewed, Fall Risk  [Encouraged Routine Use of Assistive Devices]  Advanced Directive Interventions   Advanced Directives Discussed/Reviewed Advanced Directives Discussed, Advanced Directives Reviewed  [Advanced Directives Completed & Encouraged to Provide Copy to Primary Care Provider to Scan into Electronic Medical Record]      Active Listening & Reflection Utilized.  Verbalization of Feelings Encouraged.  Emotional Support Provided. Acceptance & Commitment Therapy Initiated. Cognitive Behavioral Therapy Conducted. Client-Centered Therapy Performed. Encouraged Submission of  Completed, Signed & Notarized Advanced Directives (Living Will & Healthcare Power of Attorney Documents) to Debria Garret, Primary Care Provider & Nurse Practitioner with [facility] [phone]), to Scan into Electronic Medical Record in Epic. Encouraged Routine Engagement with Danford Bad, Licensed Clinical Social Worker with [facility] [phone]), if You Have Questions, Need Assistance, or If Additional Social  Work Needs Are Identified Between Now & Our Next Follow-Up Outreach Call, Scheduled on 11/29/2023 at 11:15 AM.      Our next appointment is by telephone on 11/29/2023 at 11:15 am.  Please call the care guide team at [phone] if you need to cancel or reschedule your appointment.   If you are experiencing a Mental Health or Behavioral Health Crisis or need someone to talk to, please call the Suicide and Crisis Lifeline: 988 call the [place] National Suicide Prevention Lifeline: 708-125-0673 or TTY: 534-179-0084 TTY 430-220-5387) to talk to a trained counselor call 1-800-273-TALK (toll free, 24 hour hotline) go to [facility] [address], [place] [phone]) call the [facility] Crisis Line: [phone] call 911  Patient verbalizes understanding of instructions and care plan provided today and agrees to view in MyChart. Active MyChart status and patient understanding of how to access instructions and care plan via MyChart confirmed with patient.     Telephone follow up appointment with care management team member scheduled for:  11/29/2023 at 11:15 am.  Danford Bad, BSW, MSW, LCSW  Embedded Practice Social Work Case Manager  [facility], Population Health Direct Dial: [phone]  Fax: [phone] Email: [email or web address]: [email or web address]

## 2023-11-09 NOTE — Patient Outreach (Signed)
Care Coordination   Follow Up Visit Note   11/09/2023  Name: Sher Colaw MRN: [id number] DOB: 03/26/1956  Almetia Nop is a 67 y.o. year old female who sees [name], NP for primary care. I spoke with [name] by phone today.  What matters to the patients health and wellness today?  Obtain Grief & Loss Counseling, Supportive Services & Resources.    Goals Addressed               This Visit's Progress     Obtain Grief & Loss Counseling, Supportive Services & Resources. (pt-stated)   On track     Care Coordination Interventions:  Interventions Today    Flowsheet Row Most Recent Value  Chronic Disease   Chronic disease during today's visit Diabetes, Hypertension (HTN), Other  [Major Depression, Bereavement, Complex Grief, Tobacco Use Disorder, Marital Discord, Bilateral Hearing Loss, Diabetic Neuropathy, Chronic Low Back Pain & Glaucoma]  General Interventions   General Interventions Discussed/Reviewed General Interventions Discussed, Labs, Vaccines, Doctor Visits, Annual Foot Exam, Health Screening, General Interventions Reviewed, Annual Eye Exam, Durable Medical Equipment (DME), Community Resources, Communication with  [Communication with Care Team Members]  Labs Hgb A1c every 3 months, Hgb A1c annually  [Encouraged]  Vaccines COVID-19, Flu, Pneumonia, RSV, Shingles, Tetanus/Pertussis/Diphtheria  [Encouraged]  Doctor Visits Discussed/Reviewed Doctor Visits Discussed, Specialist, Doctor Visits Reviewed, Annual Wellness Visits, PCP  [Encouraged Routine Engagement]  Health Screening Bone Density, Colonoscopy, Mammogram  [Encouraged]  Durable Medical Equipment (DME) Bed side commode, BP Cuff, Glucomoter, Environmental consultant, Other  [Prescription Eyeglasses, Reacher, Manufacturing systems engineer, Cane]  PCP/Specialist Visits Compliance with follow-up visit  [Encouraged]  Communication with PCP/Specialists, Charity fundraiser, Pharmacists, Social Work  [employer] Routine Engagement]  Exercise  Interventions   Exercise Discussed/Reviewed Exercise Discussed, Assistive device use and maintanence, Exercise Reviewed, Physical Activity, Weight Managment  [Encouraged]  Physical Activity Discussed/Reviewed Physical Activity Discussed, Home Exercise Program (HEP), PREP, Gym, Physical Activity Reviewed, Types of exercise  [Encouraged]  Weight Management Weight maintenance  [Encouraged]  Education Interventions   Education Provided Provided Education  Provided Verbal Education On Nutrition, Mental Health/Coping with Illness, When to see the doctor, Foot Care, Eye Care, Labs, Blood Sugar Monitoring, Exercise, Medication, [employer], [employer]  Labs Reviewed Hgb A1c  Mental Health Interventions   Mental Health Discussed/Reviewed Mental Health Discussed, Anxiety, Depression, Grief and Loss, Mental Health Reviewed, Substance Abuse, Coping Strategies, Suicide, Crisis  [Assessed]  Nutrition Interventions   Nutrition Discussed/Reviewed Increasing proteins, Adding fruits and vegetables, Nutrition Discussed, Nutrition Reviewed, Fluid intake, Decreasing fats, Decreasing salt, Portion sizes, Carbohydrate meal planning, Decreasing sugar intake, Supplemental nutrition  [Encouraged]  Pharmacy Interventions   Pharmacy Dicussed/Reviewed Pharmacy Topics Discussed, Medications and their functions, Medication Adherence, Pharmacy Topics Reviewed, Affording Medications  [Encouraged]  Medication Adherence --  [Medication Compliant]  Safety Interventions   Safety Discussed/Reviewed Safety Discussed, Safety Reviewed, Fall Risk  [Encouraged Routine Use of Assistive Devices]  Advanced Directive Interventions   Advanced Directives Discussed/Reviewed Advanced Directives Discussed, Advanced Directives Reviewed  [Advanced Directives Completed & Encouraged to Provide Copy to Primary Care Provider to Scan into Electronic Medical Record]      Active Listening & Reflection Utilized.  Verbalization of Feelings  Encouraged.  Emotional Support Provided. Acceptance & Commitment Therapy Initiated. Cognitive Behavioral Therapy Conducted. Client-Centered Therapy Performed. Encouraged Submission of Completed, Signed & Notarized Advanced Directives (Living Will & Healthcare Power of Attorney Documents) to Debria Garret, Primary Care Provider & Nurse Practitioner with [facility] [phone]), to Scan into  Electronic Medical Record in Epic. Encouraged Routine Engagement with Danford Bad, Licensed Clinical Social Worker with [facility] [phone]), if You Have Questions, Need Assistance, or If Additional Social Work Needs Are Identified Between Now & Our Next Follow-Up Outreach Call, Scheduled on 11/29/2023 at 11:15 AM.      SDOH assessments and interventions completed:  Yes.  Care Coordination Interventions:  Yes, provided.   Follow up plan: Follow up call scheduled for 11/29/2023 at 11:15 am.  Encounter Outcome:  Patient Visit Completed.   Danford Bad, BSW, MSW, Printmaker Social Work Case Set designer Health  [facility], Population Health Direct Dial: [phone]  Fax: [phone] Email: [email or web address]: [email or web address]

## 2023-11-09 NOTE — Telephone Encounter (Signed)
Pt called back complaint of pelvic/ groin pain. Feels like its a strain stated it was hard for her to explain but she feels something is wrong. 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 Myra Gianotti . Was able to move up follow up appointment up to 11-15-23 and fem-fem study on 11/10/23.pt encouraged to call back for further issues/ question.

## 2023-11-10 ENCOUNTER — Other Ambulatory Visit: Payer: Self-pay

## 2023-11-10 ENCOUNTER — Other Ambulatory Visit (HOSPITAL_COMMUNITY): Payer: Self-pay | Admitting: Surgery

## 2023-11-10 ENCOUNTER — Ambulatory Visit (HOSPITAL_COMMUNITY)
Admission: RE | Admit: 2023-11-10 | Discharge: 2023-11-10 | Disposition: A | Discharge status: Home / Self Care | Payer: 59 | Source: Ambulatory Visit | Attending: Vascular Surgery | Admitting: Vascular Surgery

## 2023-11-10 ENCOUNTER — Ambulatory Visit: Payer: Self-pay | Admitting: *Deleted

## 2023-11-10 DIAGNOSIS — I739 Peripheral vascular disease, unspecified: Secondary | ICD-10-CM

## 2023-11-12 NOTE — Patient Outreach (Signed)
Care Coordination   Follow Up Visit Note   11/12/2023  Name: Nashanda Curtright MRN: [id number] DOB: 1956-08-20  Setsuko Kiplinger is a 67 y.o. year old female who sees [name], NP for primary care. I spoke with [name] by phone today.  What matters to the patients health and wellness today?  Obtain Grief & Loss Counseling, Supportive Services & Resources.   Goals Addressed               This Visit's Progress     Obtain Grief & Loss Counseling, Supportive Services & Resources. (pt-stated)   On track     Care Coordination Interventions:  Interventions Today    Flowsheet Row Most Recent Value  Chronic Disease   Chronic disease during today's visit Diabetes, Hypertension (HTN), Other  [Major Depression, Bereavement, Complex Grief, Tobacco Use Disorder, Marital Discord, Bilateral Hearing Loss, Diabetic Neuropathy, Chronic Low Back Pain & Glaucoma]  General Interventions   General Interventions Discussed/Reviewed General Interventions Discussed, Labs, Vaccines, Doctor Visits, Annual Foot Exam, Health Screening, General Interventions Reviewed, Annual Eye Exam, Durable Medical Equipment (DME), Community Resources, Communication with  [Communication with Care Team Members]  Labs Hgb A1c every 3 months, Hgb A1c annually  [Encouraged]  Vaccines COVID-19, Flu, Pneumonia, RSV, Shingles, Tetanus/Pertussis/Diphtheria  [Encouraged]  Doctor Visits Discussed/Reviewed Doctor Visits Discussed, Specialist, Doctor Visits Reviewed, Annual Wellness Visits, PCP  [Encouraged Routine Engagement]  Health Screening Bone Density, Colonoscopy, Mammogram  [Encouraged]  Durable Medical Equipment (DME) Bed side commode, BP Cuff, Glucomoter, Environmental consultant, Other  [Prescription Eyeglasses, Reacher, Manufacturing systems engineer, Cane]  PCP/Specialist Visits Compliance with follow-up visit  [Encouraged]  Communication with PCP/Specialists, Charity fundraiser, Pharmacists, Social Work  [employer] Routine Engagement]  Exercise  Interventions   Exercise Discussed/Reviewed Exercise Discussed, Assistive device use and maintanence, Exercise Reviewed, Physical Activity, Weight Managment  [Encouraged]  Physical Activity Discussed/Reviewed Physical Activity Discussed, Home Exercise Program (HEP), PREP, Gym, Physical Activity Reviewed, Types of exercise  [Encouraged]  Weight Management Weight maintenance  [Encouraged]  Education Interventions   Education Provided Provided Education  Provided Verbal Education On Nutrition, Mental Health/Coping with Illness, When to see the doctor, Foot Care, Eye Care, Labs, Blood Sugar Monitoring, Exercise, Medication, [employer], [employer]  Labs Reviewed Hgb A1c  Mental Health Interventions   Mental Health Discussed/Reviewed Mental Health Discussed, Anxiety, Depression, Grief and Loss, Mental Health Reviewed, Substance Abuse, Coping Strategies, Suicide, Crisis  [Assessed]  Nutrition Interventions   Nutrition Discussed/Reviewed Increasing proteins, Adding fruits and vegetables, Nutrition Discussed, Nutrition Reviewed, Fluid intake, Decreasing fats, Decreasing salt, Portion sizes, Carbohydrate meal planning, Decreasing sugar intake, Supplemental nutrition  [Encouraged]  Pharmacy Interventions   Pharmacy Dicussed/Reviewed Pharmacy Topics Discussed, Medications and their functions, Medication Adherence, Pharmacy Topics Reviewed, Affording Medications  [Encouraged]  Medication Adherence --  [Medication Compliant]  Safety Interventions   Safety Discussed/Reviewed Safety Discussed, Safety Reviewed, Fall Risk  [Encouraged Routine Use of Assistive Devices]  Advanced Directive Interventions   Advanced Directives Discussed/Reviewed Advanced Directives Discussed, Advanced Directives Reviewed  [Advanced Directives Completed & Encouraged to Provide Copy to Primary Care Provider to Scan into Electronic Medical Record]      Active Listening & Reflection Utilized.  Verbalization of Feelings  Encouraged.  Emotional Support Provided. Acceptance & Commitment Therapy Initiated. Cognitive Behavioral Therapy Conducted. Client-Centered Therapy Performed. Symptoms of Pelvic/Groin Pain Acknowledged. Encouraged Submission of Completed, Signed & Notarized Advanced Directives (Living Will & Healthcare Power of Attorney Documents) to Debria Garret, Primary Care Provider & Nurse Practitioner with [facility] (#  [phone]), to Scan into Electronic Medical Record in Epic. Encouraged to Undergo Femoral Bypass Graft for Evaluation & Treatment of Peripheral Artery Disease, Scheduled on 11/10/2023 at 11:30 AM. Encouraged Routine Engagement with Danford Bad, Licensed Clinical Social Worker with [facility] [phone]), if You Have Questions, Need Assistance, or If Additional Social Work Needs Are Identified Between Now & Our Next Follow-Up Outreach Call, Scheduled on 11/29/2023 at 11:15 AM.      SDOH assessments and interventions completed:  Yes.  Care Coordination Interventions:  Yes, provided.   Follow up plan: Follow up call scheduled for 11/29/2023 at 11:15 am.  Encounter Outcome:  Patient Visit Completed.   Danford Bad, BSW, MSW, Printmaker Social Work Case Set designer Health  [facility], Population Health Direct Dial: [phone]  Fax: [phone] Email: [email or web address] Website: [email or web address]

## 2023-11-12 NOTE — Patient Instructions (Signed)
Visit Information  Thank you for taking time to visit with me today. Please don't hesitate to contact me if I can be of assistance to you.   Following are the goals we discussed today:   Goals Addressed               This Visit's Progress     Obtain Grief & Loss Counseling, Supportive Services & Resources. (pt-stated)   On track     Care Coordination Interventions:  Interventions Today    Flowsheet Row Most Recent Value  Chronic Disease   Chronic disease during today's visit Diabetes, Hypertension (HTN), Other  [Major Depression, Bereavement, Complex Grief, Tobacco Use Disorder, Marital Discord, Bilateral Hearing Loss, Diabetic Neuropathy, Chronic Low Back Pain & Glaucoma]  General Interventions   General Interventions Discussed/Reviewed General Interventions Discussed, Labs, Vaccines, Doctor Visits, Annual Foot Exam, Health Screening, General Interventions Reviewed, Annual Eye Exam, Durable Medical Equipment (DME), Community Resources, Communication with  [Communication with Care Team Members]  Labs Hgb A1c every 3 months, Hgb A1c annually  [Encouraged]  Vaccines COVID-19, Flu, Pneumonia, RSV, Shingles, Tetanus/Pertussis/Diphtheria  [Encouraged]  Doctor Visits Discussed/Reviewed Doctor Visits Discussed, Specialist, Doctor Visits Reviewed, Annual Wellness Visits, PCP  [Encouraged Routine Engagement]  Health Screening Bone Density, Colonoscopy, Mammogram  [Encouraged]  Durable Medical Equipment (DME) Bed side commode, BP Cuff, Glucomoter, Environmental consultant, Other  [Prescription Eyeglasses, Reacher, Manufacturing systems engineer, Cane]  PCP/Specialist Visits Compliance with follow-up visit  [Encouraged]  Communication with PCP/Specialists, Charity fundraiser, Pharmacists, Social Work  [employer] Routine Engagement]  Exercise Interventions   Exercise Discussed/Reviewed Exercise Discussed, Assistive device use and maintanence, Exercise Reviewed, Physical Activity, Weight Managment  [Encouraged]  Physical Activity  Discussed/Reviewed Physical Activity Discussed, Home Exercise Program (HEP), PREP, Gym, Physical Activity Reviewed, Types of exercise  [Encouraged]  Weight Management Weight maintenance  [Encouraged]  Education Interventions   Education Provided Provided Education  Provided Verbal Education On Nutrition, Mental Health/Coping with Illness, When to see the doctor, Foot Care, Eye Care, Labs, Blood Sugar Monitoring, Exercise, Medication, [employer], [employer]  Labs Reviewed Hgb A1c  Mental Health Interventions   Mental Health Discussed/Reviewed Mental Health Discussed, Anxiety, Depression, Grief and Loss, Mental Health Reviewed, Substance Abuse, Coping Strategies, Suicide, Crisis  [Assessed]  Nutrition Interventions   Nutrition Discussed/Reviewed Increasing proteins, Adding fruits and vegetables, Nutrition Discussed, Nutrition Reviewed, Fluid intake, Decreasing fats, Decreasing salt, Portion sizes, Carbohydrate meal planning, Decreasing sugar intake, Supplemental nutrition  [Encouraged]  Pharmacy Interventions   Pharmacy Dicussed/Reviewed Pharmacy Topics Discussed, Medications and their functions, Medication Adherence, Pharmacy Topics Reviewed, Affording Medications  [Encouraged]  Medication Adherence --  [Medication Compliant]  Safety Interventions   Safety Discussed/Reviewed Safety Discussed, Safety Reviewed, Fall Risk  [Encouraged Routine Use of Assistive Devices]  Advanced Directive Interventions   Advanced Directives Discussed/Reviewed Advanced Directives Discussed, Advanced Directives Reviewed  [Advanced Directives Completed & Encouraged to Provide Copy to Primary Care Provider to Scan into Electronic Medical Record]      Active Listening & Reflection Utilized.  Verbalization of Feelings Encouraged.  Emotional Support Provided. Acceptance & Commitment Therapy Initiated. Cognitive Behavioral Therapy Conducted. Client-Centered Therapy Performed. Symptoms of Pelvic/Groin Pain  Acknowledged. Encouraged Submission of Completed, Signed & Notarized Advanced Directives (Living Will & Healthcare Power of Attorney Documents) to Debria Garret, Primary Care Provider & Nurse Practitioner with [facility] [phone]), to Scan into Electronic Medical Record in Epic. Encouraged to Undergo Femoral Bypass Graft for Evaluation & Treatment of Peripheral Artery Disease, Scheduled on 11/10/2023 at 11:30 AM. Encouraged  Routine Engagement with Danford Bad, Licensed Clinical Social Worker with [facility] [phone]), if You Have Questions, Need Assistance, or If Additional Social Work Needs Are Identified Between Now & Our Next Follow-Up Outreach Call, Scheduled on 11/29/2023 at 11:15 AM.      Our next appointment is by telephone on 11/29/2023 at 11:15 am.  Please call the care guide team at [phone] if you need to cancel or reschedule your appointment.   If you are experiencing a Mental Health or Behavioral Health Crisis or need someone to talk to, please call the Suicide and Crisis Lifeline: 988 call the [place] National Suicide Prevention Lifeline: (718)345-6294 or TTY: 3150900255 TTY (458)136-6111) to talk to a trained counselor call 1-800-273-TALK (toll free, 24 hour hotline) go to [facility] [address] [place] [phone]) call the [facility] Crisis Line: 6192166876 call 911  Patient verbalizes understanding of instructions and care plan provided today and agrees to view in MyChart. Active MyChart status and patient understanding of how to access instructions and care plan via MyChart confirmed with patient.     Telephone follow up appointment with care management team member scheduled for:  11/29/2023 at 11:15 am.  Danford Bad, BSW, MSW, LCSW  Embedded Practice Social Work Case Manager  [facility], Population Health Direct Dial: [phone]  Fax:  [phone] Email: [email or web address]: [email or web address]

## 2023-11-15 ENCOUNTER — Ambulatory Visit: Payer: 59 | Admitting: Physician Assistant

## 2023-11-15 VITALS — BP 157/87 | HR 82 | Temp 98.5°F | Ht 65.0 in | Wt 119.0 lb

## 2023-11-15 DIAGNOSIS — I70509 Unspecified atherosclerosis of nonautologous biological bypass graft(s) of the extremities, unspecified extremity: Secondary | ICD-10-CM

## 2023-11-15 DIAGNOSIS — I739 Peripheral vascular disease, unspecified: Secondary | ICD-10-CM

## 2023-11-15 NOTE — Progress Notes (Unsigned)
Office Note     CC:  follow up Requesting Provider:  Monna Fam, MD  HPI: Angie Cain is a 67 y.o. (1956-09-17) female who presents for surveillance of PAD.  She has history of femorofemoral bypass in 2011 by Dr. Darrick Penna.  This was performed due to lifestyle limiting claudication.  She was seen in the office in June of this year and at that time had a femorofemoral duplex demonstrating inflow and outflow elevated velocities.  Angiogram performed in July was negative for any hemodynamically significant stenosis.  She denies any claudication, rest pain, or tissue loss of bilateral lower extremities.  She is on aspirin and statin daily.  She is ambulatory with a cane.  She is grieving the loss of her son which occurred earlier this year.   Past Medical History:  Diagnosis Date   Allergy    Anxiety    occasional   Arthritis    Atherosclerotic peripheral vascular disease with intermittent claudication (HCC)    s/p fem-fem bypass 2011. ABI 07/2012 0.5 R 0.65 L   Diabetes mellitus without complication (HCC)    GERD (gastroesophageal reflux disease)    Hyperlipidemia    Hypertension    IDDM (insulin dependent diabetes mellitus) 07/21/2007   July 2011: Femoral-femoral bypass  ABI 08/11/2012 notable for 0.4-0.59 on right ankle suggestive of severe arterial occlusive disease at rest and 0.60-0.7 on left ankle suggestive of moderate arterial occlusive disease at rest.    Iron deficiency anemia 05/08/2017   Low back pain 12/18/2013   Oral thrush 09/29/2022   Personal history of colonic polyps - adenomas 03/07/2014   Rotator cuff impingement syndrome of left shoulder 03/23/2016   Tobacco abuse     Past Surgical History:  Procedure Laterality Date   ABDOMINAL AORTOGRAM W/LOWER EXTREMITY N/A 06/22/2023   Procedure: ABDOMINAL AORTOGRAM W/LOWER EXTREMITY;  Surgeon: Nada Libman, MD;  Location: [facility];  Service: Cardiovascular;  Laterality: N/A;   BREAST BIOPSY Left 02/08/2019    clip placement   VAGINAL HYSTERECTOMY     VEIN BYPASS SURGERY  06/13/2010   vein from left to right hip    Social History   Socioeconomic History   Marital status: Married    Spouse name: Angie Cain   Number of children: 1   Years of education: 12   Highest education level: 12th grade  Occupational History   Not on file  Tobacco Use   Smoking status: Every Day    Current packs/day: 0.25    Types: Cigarettes    Passive exposure: Current   Smokeless tobacco: Never   Tobacco comments:    Smokes 1 cig/day.    Smoking Cessation Classes, Services, Agencies & Resources Offered.  Vaping Use   Vaping status: Never Used  Substance and Sexual Activity   Alcohol use: Yes    Alcohol/week: 1.0 - 2.0 standard drink of alcohol    Types: 1 - 2 Glasses of wine per week    Comment: weekly   Drug use: No   Sexual activity: Yes    Partners: Male  Other Topics Concern   Not on file  Social History Narrative   Current Social History 11/22/2020        Patient lives with spouse in an apartment which is 1 story. There are no steps up to the entrance.       Patient's method of transportation is personal car.      The highest level of education was high school diploma.  The patient currently disabled.      Identified important Relationships are Husband and sons       Pets : None       Interests / Fun: Arts and crafts, music, reading       Current Stressors: depression       Religious / Personal Beliefs: Christian, non-denominational    Social Determinants of Health   Financial Resource Strain: Low Risk  (10/25/2023)   Overall Financial Resource Strain (CARDIA)    Difficulty of Paying Living Expenses: Not hard at all  Food Insecurity: No Food Insecurity (10/25/2023)   Hunger Vital Sign    Worried About Running Out of Food in the Last Year: Never true    Ran Out of Food in the Last Year: Never true  Transportation Needs: No Transportation Needs (10/25/2023)   PRAPARE -  Administrator, Civil Service (Medical): No    Lack of Transportation (Non-Medical): No  Physical Activity: Inactive (10/25/2023)   Exercise Vital Sign    Days of Exercise per Week: 0 days    Minutes of Exercise per Session: 0 min  Stress: No Stress Concern Present (10/25/2023)   [employer] of Occupational Health - Occupational Stress Questionnaire    Feeling of Stress : Only a little  Social Connections: Socially Integrated (10/25/2023)   Social Connection and Isolation Panel [NHANES]    Frequency of Communication with Friends and Family: More than three times a week    Frequency of Social Gatherings with Friends and Family: More than three times a week    Attends Religious Services: More than 4 times per year    Active Member of [employer] or Organizations: Yes    Attends Engineer, structural: More than 4 times per year    Marital Status: Married  Catering manager Violence: Not At Risk (10/25/2023)   Humiliation, Afraid, Rape, and Kick questionnaire    Fear of Current or Ex-Partner: No    Emotionally Abused: No    Physically Abused: No    Sexually Abused: No    Family History  Problem Relation Age of Onset   Diabetes Mother    Diabetes Father    Diabetes Sister    Hypertension Sister    Diabetes Brother    Hypertension Brother    Diabetes Son    Diabetes Brother    Diabetes Brother    Diabetes Brother    Diabetes Brother    Diabetes Brother    Colon cancer Neg Hx    Esophageal cancer Neg Hx    Stomach cancer Neg Hx    Rectal cancer Neg Hx    Breast cancer Neg Hx     Current Outpatient Medications  Medication Sig Dispense Refill   Accu-Chek FastClix Lancets MISC CHECK BLOOD SUGAR 3 TIMES A DAY 306 each 1   Accu-Chek FastClix Lancets MISC Use 1 lancet as directed to check blood sugar levels. 100 each 5   Alcohol Swabs (B-D SINGLE USE SWABS REGULAR) PADS Use 1 pad 6 (six) times daily. 600 each 1   amLODipine (NORVASC) 10 MG tablet Take 1 tablet  (10 mg total) by mouth every morning. 90 tablet 3   amoxicillin-clavulanate (AUGMENTIN) 875-125 MG tablet Take 1 tablet by mouth every 12 (twelve) hours. 14 tablet 0   aspirin EC 81 MG tablet Take 81 mg by mouth daily.     atorvastatin (LIPITOR) 40 MG tablet Take 1 tablet (40 mg total) by mouth at bedtime. 90 tablet  3   Blood Glucose Calibration (ACCU-CHEK AVIVA) SOLN Use to check controls on diabetic glucose meter monthly 1 each 0   Blood Glucose Monitoring Suppl (ACCU-CHEK AVIVA PLUS) w/Device KIT Check blood sugar up to 3 times a day 1 kit 1   Blood Glucose Monitoring Suppl (ACCU-CHEK GUIDE ME) w/Device KIT use once a day as directed. 1 kit 1   brimonidine (ALPHAGAN) 0.2 % ophthalmic solution Place 1 drop into the left eye 2 (two) times daily. 15 mL 3   brimonidine (ALPHAGAN) 0.2 % ophthalmic solution Instill 1 drop into both eyes twice a day 30 mL 3   brimonidine (ALPHAGAN) 0.2 % ophthalmic solution Place 1 drop into both eyes 2 (two) times daily. 30 mL 3   calcium-vitamin D (OSCAL WITH D) 500-200 MG-UNIT tablet Take 1 tablet by mouth 2 (two) times daily. 60 tablet 3   cetirizine (ZYRTEC ALLERGY) 10 MG tablet Take 1 tablet (10 mg total) by mouth daily. 7 tablet 0   Dapagliflozin Pro-metFORMIN ER (XIGDUO XR) 04-999 MG TB24 Take 1 tablet by mouth daily. 90 tablet 3   Dapagliflozin Pro-metFORMIN ER (XIGDUO XR) 04-999 MG TB24 Take 1 tablet by mouth daily for Diabetes. 90 tablet 3   diclofenac Sodium (VOLTAREN) 1 % GEL Apply 2 g topically 4 (four) times daily. 100 g 2   Dulaglutide (TRULICITY) 1.5 MG/0.5ML SOPN Inject 1.5 mg into the skin once a week. 6 mL 3   famotidine (PEPCID) 20 MG tablet Take 1 tablet (20 mg total) by mouth at bedtime. 30 tablet 1   glucose blood (ACCU-CHEK GUIDE TEST) test strip Use to check blood sugar as needed 100 each 2   glucose blood (ACCU-CHEK GUIDE TEST) test strip Use to check blood sugar as needed 100 each 10   glucose blood (ACCU-CHEK GUIDE) test strip Check blood  sugar 3 times per day 300 each 1   ibuprofen (ADVIL) 600 MG tablet Take 1 tablet (600 mg total) by mouth every 6 (six) hours as needed. 30 tablet 0   insulin degludec (TRESIBA FLEXTOUCH) 100 UNIT/ML FlexTouch Pen Inject 22 Units into the skin daily. 15 mL 3   Insulin Pen Needle 32G X 4 MM MISC Use to inject insulin once daily at bedtime 100 each 4   latanoprost (XALATAN) 0.005 % ophthalmic solution PLACE 1 DROP INTO BOTH EYES AT BEDTIME. 7.5 mL 0   latanoprost (XALATAN) 0.005 % ophthalmic solution Place 1 drop into both eyes at bedtime. 7.5 mL 3   lidocaine (LIDODERM) 5 % Place 1 patch onto the skin daily. Remove & Discard patch within 12 hours or as directed by MD 30 patch 0   metroNIDAZOLE (METROGEL) 0.75 % vaginal gel Place 1 Applicatorful vaginally at bedtime for 5 days. 70 g 0   mirtazapine (REMERON) 15 MG tablet Take 1 tablet (15 mg total) by mouth at bedtime for appetite/sleep 90 tablet 3   mupirocin ointment (BACTROBAN) 2 % Apply 1 Application topically daily. 22 g 0   No current facility-administered medications for this visit.    Allergies  Allergen Reactions   Bupropion Hives and Swelling   Jardiance [Empagliflozin] Other (See Comments)    Caused a UTI   Metronidazole Itching   Shellfish Allergy Itching and Other (See Comments)    Mouth irritation/itching   Shrimp Extract Itching     REVIEW OF SYSTEMS:   [X]  denotes positive finding, [ ]  denotes negative finding Cardiac  Comments:  Chest pain or chest pressure:  Shortness of breath upon exertion:    Short of breath when lying flat:    Irregular heart rhythm:        Vascular    Pain in calf, thigh, or hip brought on by ambulation:    Pain in feet at night that wakes you up from your sleep:     Blood clot in your veins:    Leg swelling:         Pulmonary    Oxygen at home:    Productive cough:     Wheezing:         Neurologic    Sudden weakness in arms or legs:     Sudden numbness in arms or legs:      Sudden onset of difficulty speaking or slurred speech:    Temporary loss of vision in one eye:     Problems with dizziness:         Gastrointestinal    Blood in stool:     Vomited blood:         Genitourinary    Burning when urinating:     Blood in urine:        Psychiatric    Major depression:         Hematologic    Bleeding problems:    Problems with blood clotting too easily:        Skin    Rashes or ulcers:        Constitutional    Fever or chills:      PHYSICAL EXAMINATION:  Vitals:   11/15/23 1517  BP: (!) 157/87  Pulse: 82  Temp: 98.5 F (36.9 C)  TempSrc: Temporal  SpO2: 97%  Weight: 119 lb (54 kg)  Height: 5\' 5"  (1.651 m)    General:  WDWN in NAD; vital signs documented above Gait: Not observed HENT: WNL, normocephalic Pulmonary: normal non-labored breathing , without Rales, rhonchi,  wheezing Cardiac: regular HR Abdomen: soft, NT, no masses Skin: without rashes Vascular Exam/Pulses: Absent pedal pulses; 2+ palpable femoral pulses and femorofemoral bypass pulse Extremities: without ischemic changes, without Gangrene , without cellulitis; without open wounds;  Musculoskeletal: no muscle wasting or atrophy  Neurologic: A&O X 3 Psychiatric:  The pt has Normal affect.   Non-Invasive Vascular Imaging:    Patent femorofemoral bypass on duplex with low flow velocities through the bypass   ASSESSMENT/PLAN:: 67 y.o. female here for follow up for surveillance of PAD with history of femorofemoral bypass  [name] is a 67 year old female with history of left to right femorofemoral bypass performed by Dr. Darrick Penna in 2011.  She underwent angiogram in July of this year due to findings of elevated velocities at the inflow and outflow to her femorofemoral bypass.  Angiogram however was negative and no intervention was performed.  Duplex today does not demonstrate elevated inflow and outflow velocities however does show low flow velocities through the  femorofemoral bypass.  On physical exam she has easily palpable 2+ pulses at both femorals as well as in the bypass itself.  She will continue her aspirin and statin daily.  We will continue conservative management with surveillance in another 6 months.  She knows to call/return office sooner with any questions or concerns.   Emilie Rutter, PA-C [facility] [phone]  Clinic MD:   Karin Lieu on call

## 2023-11-16 ENCOUNTER — Encounter: Payer: Self-pay | Admitting: Physician Assistant

## 2023-11-18 ENCOUNTER — Other Ambulatory Visit: Payer: Self-pay

## 2023-11-18 DIAGNOSIS — I739 Peripheral vascular disease, unspecified: Secondary | ICD-10-CM

## 2023-11-29 ENCOUNTER — Ambulatory Visit: Payer: Self-pay | Admitting: *Deleted

## 2023-11-29 NOTE — Patient Outreach (Signed)
Care Coordination   Follow Up Visit Note   11/29/2023  Name: Angie Cain MRN: [id number] DOB: [birth year]  Angie Cain is a 67 y.o. year old female who sees [name], NP for primary care. I spoke with Angie Cain by phone today.  What matters to the patients health and wellness today?  Obtain Grief & Loss Counseling, Supportive Services & Resources.    Goals Addressed               This Visit's Progress     Obtain Grief & Loss Counseling, Supportive Services & Resources. (pt-stated)   On track     Care Coordination Interventions:   Interventions Today    Flowsheet Row Most Recent Value  Chronic Disease   Chronic disease during today's visit Diabetes, Hypertension (HTN), Other  [Major Depression, Bereavement, Complex Grief, Tobacco Use Disorder, Marital Discord, Bilateral Hearing Loss, Diabetic Neuropathy, Chronic Low Back Pain & Glaucoma, Marital Discord, Limited Support System.]  General Interventions   General Interventions Discussed/Reviewed General Interventions Discussed, General Interventions Reviewed, Annual Eye Exam, Labs, Annual Foot Exam, [employer], Health Screening, Vaccines, Horticulturist, commercial (DME), Doctor Visits, Communication with, Level of Care  [Communication with Care Team Members.]  Labs Hgb A1c every 3 months, Hgb A1c annually, Kidney Function  [Encouraged Routine Labs.]  Vaccines COVID-19, Flu, Pneumonia, RSV, Shingles, Tetanus/Pertussis/Diphtheria  [Encouraged Annual Vaccinations.]  Doctor Visits Discussed/Reviewed Doctor Visits Discussed, Doctor Visits Reviewed, Annual Wellness Visits, PCP, Specialist  [Encouraged Routine Engagement with Care Team Members.]  Health Screening Bone Density, Colonoscopy, Mammogram  [Encouraged Annual Health Screenings.]  Durable Medical Equipment (DME) Bed side commode, BP Cuff, Glucomoter, Environmental consultant, Other  [Prescription Eyeglasses, Reacher, Manufacturing systems engineer, Cane.]  PCP/Specialist  Visits Compliance with follow-up visit  [Encouraged Routine Engagement with Care Team Members.]  Communication with PCP/Specialists, Charity fundraiser, Pharmacists, Social Work  [employer] Routine Engagement with Care Team Members.]  Level of Care Adult Daycare, Air traffic controller, Assisted Living, Skilled Nursing Facility  [Confirmed Disinterest in Enrollment in Adult Day Care Program or Receiving Assistance with Pursuing Higher Level of Care Placement Options (I.e Assisted Living Versus Skilled Nursing Facility).]  Applications Medicaid, Personal Care Services  [Confirmed Current Recipient of [place] [employer] [place]. Confirmed Disinterest in Applying for Personal Care Services.]  Exercise Interventions   Exercise Discussed/Reviewed Exercise Discussed, Exercise Reviewed, Physical Activity, Weight Managment, Assistive device use and maintanence  [Encoruaged Daily Exercise Regimen, as Tolerated.]  Physical Activity Discussed/Reviewed Physical Activity Discussed, Gym, Physical Activity Reviewed, Types of exercise, Home Exercise Program (HEP), PREP  [Encouraged Routine Engagement in Activities of Interest, Inside & Outside the Home.]  Weight Management Weight maintenance  [Encouraged Consideration of Nutritional Consult.]  Education Interventions   Education Provided Provided Education  [employer] Reviewed Educational Material & Encouraged Consideration & Implementation.]  Provided Verbal Education On Nutrition, Exercise, Medication, Foot Care, Eye Care, Labs, Blood Sugar Monitoring, Mental Health/Coping with Illness, Applications, Insurance Plans, [facility], When to see the doctor  [Encouraged Consideration & Implementation.]  Ship broker, Personal Care Services  [place] Current Recipient of [place] [employer]. Confirmed Disinterest in Applying for Personal Care Services.]  Mental Health Interventions   Mental Health Discussed/Reviewed Mental Health Discussed, Substance Abuse, Suicide, Other,  Mental Health Reviewed, Coping Strategies, Crisis, Anxiety, Depression, Grief and Loss  [Assessed Mental Health & Cognitive Status. Encouraged Self-Enrollment in [facility] to Help Cope with Marital Discord.]  Nutrition Interventions   Nutrition Discussed/Reviewed Nutrition Discussed, Portion sizes, Decreasing sugar intake, Nutrition Reviewed, Increasing proteins, Carbohydrate meal  planning, Decreasing fats, Adding fruits and vegetables, Decreasing salt, Fluid intake, Supplemental nutrition  [Encouraged Heart-Healthy, Diabetic-Friendly, High Protein Diet.]  Pharmacy Interventions   Pharmacy Dicussed/Reviewed Pharmacy Topics Discussed, Affording Medications, Pharmacy Topics Reviewed, Medications and their functions, Medication Adherence  [Confirmed Ability to Afford Prescription Medications.]  Medication Adherence --  [Confirmed Compliance with Prescription Medications.]  Safety Interventions   Safety Discussed/Reviewed Safety Discussed, Safety Reviewed, Fall Risk, Home Safety  [Encouraged Consideration of Home Safety Evaluation.]  Home Safety Assistive Devices, Need for home safety assessment  [Encouraged Routine Use of Assistive & Home Safety Devices.]  Advanced Directive Interventions   Advanced Directives Discussed/Reviewed Advanced Directives Discussed, Advanced Directives Reviewed  [Confirmed Initiation of Advanced Directives (Living Will & Healthcare Power of Attorney Documents) & Encouraged to Provide Copies to Primary Care Provider to Scan into Electronic Medical Record in Epic.]      Active Listening & Reflection Utilized.  Verbalization of Feelings Encouraged.  Emotional Support Provided. Cognitive Behavioral Therapy Initiated. Client-Centered Therapy Implemented. Acceptance & Commitment Therapy Indicated. Encouraged Routine Engagement with Danford Bad, Licensed Clinical Social Worker with [facility] [phone]), if You Have Questions, Need  Assistance, or If Additional Social Work Needs Are Identified Between Now & Our Next Follow-Up Outreach Call, Scheduled on 12/28/2023 at 9:00 AM.      SDOH assessments and interventions completed:  Yes.  Care Coordination Interventions:  Yes, provided.   Follow up plan: Follow up call scheduled for 12/28/2023 at 9:00 am.  Encounter Outcome:  Patient Visit Completed.   Danford Bad, BSW, MSW, Printmaker Social Work Case Set designer Health  [facility], Population Health Direct Dial: [phone]  Fax: [phone] Email: [email or web address]: [email or web address]

## 2023-11-29 NOTE — Patient Instructions (Signed)
Visit Information  Thank you for taking time to visit with me today. Please don't hesitate to contact me if I can be of assistance to you.   Following are the goals we discussed today:   Goals Addressed               This Visit's Progress     Obtain Grief & Loss Counseling, Supportive Services & Resources. (pt-stated)   On track     Care Coordination Interventions:   Interventions Today    Flowsheet Row Most Recent Value  Chronic Disease   Chronic disease during today's visit Diabetes, Hypertension (HTN), Other  [Major Depression, Bereavement, Complex Grief, Tobacco Use Disorder, Marital Discord, Bilateral Hearing Loss, Diabetic Neuropathy, Chronic Low Back Pain & Glaucoma, Marital Discord, Limited Support System.]  General Interventions   General Interventions Discussed/Reviewed General Interventions Discussed, General Interventions Reviewed, Annual Eye Exam, Labs, Annual Foot Exam, [employer], Health Screening, Vaccines, Horticulturist, commercial (DME), Doctor Visits, Communication with, Level of Care  [Communication with Care Team Members.]  Labs Hgb A1c every 3 months, Hgb A1c annually, Kidney Function  [Encouraged Routine Labs.]  Vaccines COVID-19, Flu, Pneumonia, RSV, Shingles, Tetanus/Pertussis/Diphtheria  [Encouraged Annual Vaccinations.]  Doctor Visits Discussed/Reviewed Doctor Visits Discussed, Doctor Visits Reviewed, Annual Wellness Visits, PCP, Specialist  [Encouraged Routine Engagement with Care Team Members.]  Health Screening Bone Density, Colonoscopy, Mammogram  [Encouraged Annual Health Screenings.]  Durable Medical Equipment (DME) Bed side commode, BP Cuff, Glucomoter, Environmental consultant, Other  [Prescription Eyeglasses, Reacher, Manufacturing systems engineer, Cane.]  PCP/Specialist Visits Compliance with follow-up visit  [Encouraged Routine Engagement with Care Team Members.]  Communication with PCP/Specialists, Charity fundraiser, Pharmacists, Social Work  [employer] Routine Engagement with  Care Team Members.]  Level of Care Adult Daycare, Air traffic controller, Assisted Living, Skilled Nursing Facility  [Confirmed Disinterest in Enrollment in Adult Day Care Program or Receiving Assistance with Pursuing Higher Level of Care Placement Options (I.e Assisted Living Versus Skilled Nursing Facility).]  Applications Medicaid, Personal Care Services  [Confirmed Current Recipient of [place]. Confirmed Disinterest in Applying for Personal Care Services.]  Exercise Interventions   Exercise Discussed/Reviewed Exercise Discussed, Exercise Reviewed, Physical Activity, Weight Managment, Assistive device use and maintanence  [Encoruaged Daily Exercise Regimen, as Tolerated.]  Physical Activity Discussed/Reviewed Physical Activity Discussed, Gym, Physical Activity Reviewed, Types of exercise, Home Exercise Program (HEP), PREP  [Encouraged Routine Engagement in Activities of Interest, Inside & Outside the Home.]  Weight Management Weight maintenance  [Encouraged Consideration of Nutritional Consult.]  Education Interventions   Education Provided Provided Education  [employer] Reviewed Educational Material & Encouraged Consideration & Implementation.]  Provided Verbal Education On Nutrition, Exercise, Medication, Foot Care, Eye Care, Labs, Blood Sugar Monitoring, Mental Health/Coping with Illness, Applications, Insurance Plans, [employer], When to see the doctor  [Encouraged Consideration & Implementation.]  Ship broker, Personal Care Services  [place] Current Recipient of [place] [employer]. Confirmed Disinterest in Applying for Personal Care Services.]  Mental Health Interventions   Mental Health Discussed/Reviewed Mental Health Discussed, Substance Abuse, Suicide, Other, Mental Health Reviewed, Coping Strategies, Crisis, Anxiety, Depression, Grief and Loss  [Assessed Mental Health & Cognitive Status. Encouraged Self-Enrollment in [facility] to Help Cope with  Marital Discord.]  Nutrition Interventions   Nutrition Discussed/Reviewed Nutrition Discussed, Portion sizes, Decreasing sugar intake, Nutrition Reviewed, Increasing proteins, Carbohydrate meal planning, Decreasing fats, Adding fruits and vegetables, Decreasing salt, Fluid intake, Supplemental nutrition  [Encouraged Heart-Healthy, Diabetic-Friendly, High Protein Diet.]  Pharmacy Interventions   Pharmacy Dicussed/Reviewed Pharmacy Topics Discussed, Affording  Medications, Pharmacy Topics Reviewed, Medications and their functions, Medication Adherence  [Confirmed Ability to Afford Prescription Medications.]  Medication Adherence --  [Confirmed Compliance with Prescription Medications.]  Safety Interventions   Safety Discussed/Reviewed Safety Discussed, Safety Reviewed, Fall Risk, Home Safety  [Encouraged Consideration of Home Safety Evaluation.]  Home Safety Assistive Devices, Need for home safety assessment  [Encouraged Routine Use of Assistive & Home Safety Devices.]  Advanced Directive Interventions   Advanced Directives Discussed/Reviewed Advanced Directives Discussed, Advanced Directives Reviewed  [Confirmed Initiation of Advanced Directives (Living Will & Healthcare Power of Attorney Documents) & Encouraged to Provide Copies to Primary Care Provider to Scan into Electronic Medical Record in Epic.]      Active Listening & Reflection Utilized.  Verbalization of Feelings Encouraged.  Emotional Support Provided. Cognitive Behavioral Therapy Initiated. Client-Centered Therapy Implemented. Acceptance & Commitment Therapy Indicated. Encouraged Routine Engagement with Danford Bad, Licensed Clinical Social Worker with [facility] [phone]), if You Have Questions, Need Assistance, or If Additional Social Work Needs Are Identified Between Now & Our Next Follow-Up Outreach Call, Scheduled on 12/28/2023 at 9:00 AM.      Our next appointment is by telephone on 12/28/2023 at  9:00 am.  Please call the care guide team at [phone] if you need to cancel or reschedule your appointment.   If you are experiencing a Mental Health or Behavioral Health Crisis or need someone to talk to, please call the Suicide and Crisis Lifeline: 988 call the [place] National Suicide Prevention Lifeline: 870-142-5646 or TTY: (307)345-9208 TTY (502)394-6291) to talk to a trained counselor call 1-800-273-TALK (toll free, 24 hour hotline) go to [facility] [address] [place] [phone]) call the [facility] Crisis Line: [phone] call 911  Patient verbalizes understanding of instructions and care plan provided today and agrees to view in MyChart. Active MyChart status and patient understanding of how to access instructions and care plan via MyChart confirmed with patient.     Telephone follow up appointment with care management team member scheduled for:  12/28/2023 at 9:00 am.  Danford Bad, BSW, MSW, LCSW  Embedded [employer] Social Work Case Manager  [facility], Population Health Direct Dial: [phone]  Fax: [phone] Email: [email or web address] Website: [email or web address]

## 2023-12-27 ENCOUNTER — Other Ambulatory Visit: Payer: Self-pay

## 2023-12-27 ENCOUNTER — Other Ambulatory Visit (HOSPITAL_COMMUNITY): Payer: Self-pay

## 2023-12-27 ENCOUNTER — Encounter (HOSPITAL_COMMUNITY): Payer: 59

## 2023-12-27 MED ORDER — OMEPRAZOLE 40 MG PO CPDR
40.0000 mg | DELAYED_RELEASE_CAPSULE | Freq: Every day | ORAL | 3 refills | Status: DC
  Filled 2023-12-27: qty 90, 90d supply, fill #0

## 2023-12-28 ENCOUNTER — Ambulatory Visit: Payer: Self-pay | Admitting: *Deleted

## 2023-12-28 ENCOUNTER — Other Ambulatory Visit (HOSPITAL_COMMUNITY): Payer: Self-pay

## 2023-12-28 NOTE — Patient Instructions (Signed)
 Visit Information  Thank you for taking time to visit with me today. Please don't hesitate to contact me if I can be of assistance to you.   Following are the goals we discussed today:   Goals Addressed               This Visit's Progress     COMPLETED: Obtain Grief & Loss Counseling, Supportive Services & Resources. (pt-stated)   On track     Care Coordination Interventions:   Interventions Today    Flowsheet Row Most Recent Value  Chronic Disease   Chronic disease during today's visit Diabetes, Hypertension (HTN), Other  [Major Depression, Bereavement, Complex Grief, Tobacco Use Disorder, Marital Discord, Bilateral Hearing Loss, Diabetic Neuropathy, Chronic Low Back Pain & Glaucoma, Marital Discord, Limited Support System.]  General Interventions   General Interventions Discussed/Reviewed General Interventions Discussed, General Interventions Reviewed, Annual Eye Exam, Labs, Annual Foot Exam, [employer], Health Screening, Vaccines, Horticulturist, Commercial (DME), Doctor Visits, Communication with, Level of Care  [Communication with Care Team Members.]  Labs Hgb A1c every 3 months, Hgb A1c annually, Kidney Function  [Encouraged Routine Labs.]  Vaccines COVID-19, Flu, Pneumonia, RSV, Shingles, Tetanus/Pertussis/Diphtheria  [Encouraged Annual Vaccinations.]  Doctor Visits Discussed/Reviewed Doctor Visits Discussed, Doctor Visits Reviewed, Annual Wellness Visits, PCP, Specialist  [Encouraged Routine Engagement with Care Team Members.]  Health Screening Bone Density, Colonoscopy, Mammogram  [Encouraged Annual Health Screenings.]  Durable Medical Equipment (DME) Bed side commode, BP Cuff, Glucomoter, Environmental Consultant, Other  [Prescription Eyeglasses, Reacher, Manufacturing Systems Engineer, Cane.]  PCP/Specialist Visits Compliance with follow-up visit  [Encouraged Routine Engagement with Care Team Members.]  Communication with PCP/Specialists, CHARITY FUNDRAISER, Pharmacists, Social Work  [employer] Routine  Engagement with Care Team Members.]  Level of Care Adult Daycare, Air Traffic Controller, Assisted Living, Skilled Nursing Facility  [Confirmed Disinterest in Enrollment in Adult Day Care Program or Receiving Assistance with Pursuing Higher Level of Care Placement Options (I.e Assisted Living Versus Skilled Nursing Facility).]  Applications Medicaid, Personal Care Services  [Confirmed Current Recipient of [place]. Confirmed Disinterest in Applying for Personal Care Services.]  Exercise Interventions   Exercise Discussed/Reviewed Exercise Discussed, Exercise Reviewed, Physical Activity, Weight Managment, Assistive device use and maintanence  [Encoruaged Daily Exercise Regimen, as Tolerated.]  Physical Activity Discussed/Reviewed Physical Activity Discussed, Gym, Physical Activity Reviewed, Types of exercise, Home Exercise Program (HEP), PREP  [Encouraged Routine Engagement in Activities of Interest, Inside & Outside the Home.]  Weight Management Weight maintenance  [Encouraged Consideration of Nutritional Consult.]  Education Interventions   Education Provided Provided Education  [employer] Reviewed Educational Material & Encouraged Consideration & Implementation.]  Provided Verbal Education On Nutrition, Exercise, Medication, Foot Care, Eye Care, Labs, Blood Sugar Monitoring, Mental Health/Coping with Illness, Applications, Insurance Plans, [employer], When to see the doctor  [Encouraged Consideration & Implementation.]  Ship Broker, Personal Care Services  [place] Current Recipient of [place] [employer]. Confirmed Disinterest in Applying for Personal Care Services.]  Mental Health Interventions   Mental Health Discussed/Reviewed Mental Health Discussed, Substance Abuse, Suicide, Other, Mental Health Reviewed, Coping Strategies, Crisis, Anxiety, Depression, Grief and Loss  [Assessed Mental Health & Cognitive Status. Encouraged Self-Enrollment in [facility]  to Help Cope with Marital Discord.]  Nutrition Interventions   Nutrition Discussed/Reviewed Nutrition Discussed, Portion sizes, Decreasing sugar intake, Nutrition Reviewed, Increasing proteins, Carbohydrate meal planning, Decreasing fats, Adding fruits and vegetables, Decreasing salt, Fluid intake, Supplemental nutrition  [Encouraged Heart-Healthy, Diabetic-Friendly, High Protein Diet.]  Pharmacy Interventions   Pharmacy Dicussed/Reviewed Pharmacy Topics Discussed,  Affording Medications, Pharmacy Topics Reviewed, Medications and their functions, Medication Adherence  [Confirmed Ability to Afford Prescription Medications.]  Medication Adherence --  [Confirmed Compliance with Prescription Medications.]  Safety Interventions   Safety Discussed/Reviewed Safety Discussed, Safety Reviewed, Fall Risk, Home Safety  [Encouraged Consideration of Home Safety Evaluation.]  Home Safety Assistive Devices, Need for home safety assessment  [Encouraged Routine Use of Assistive & Home Safety Devices.]  Advanced Directive Interventions   Advanced Directives Discussed/Reviewed Advanced Directives Discussed, Advanced Directives Reviewed  [Confirmed Initiation of Advanced Directives (Living Will & Healthcare Power of Attorney Documents) & Encouraged to Provide Copies to Primary Care Provider to Scan into Electronic Medical Record in Epic.]      Active Listening & Reflection Utilized.  Verbalization of Feelings Encouraged.  Emotional Support Provided. Cognitive Behavioral Therapy Initiated. Client-Centered Therapy Implemented. Acceptance & Commitment Therapy Indicated. Encouraged Daily Implementation of Deep Breathing Exercises, Relaxation Techniques & Mindfulness Meditation Strategies. Encouraged Daily Journaling, As a Means to Express Thoughts, Feelings & Emotions. Encouraged Self-Enrollment in Grief & Loss Support Group of Interest in [facility], from List Provided. Encouraged Engagement with Philippe Desanctis, Licensed Clinical Social Worker with [facility] [phone]), if You Have Questions, Need Assistance, or If Additional Social Work Needs Are Identified in The Near Future.      Please call the care guide team at [phone] if you need to cancel or reschedule your appointment.   If you are experiencing a Mental Health or Behavioral Health Crisis or need someone to talk to, please call the Suicide and Crisis Lifeline: 988 call the [place]  National Suicide Prevention Lifeline: 6080342524 or TTY: 218-368-8175 TTY 580-863-5435) to talk to a trained counselor call 1-800-273-TALK (toll free, 24 hour hotline) go to [facility] Urgent Care [address], [place] [phone]) call the [facility] Crisis Line: [phone] call 911  Patient verbalizes understanding of instructions and care plan provided today and agrees to view in MyChart. Active MyChart status and patient understanding of how to access instructions and care plan via MyChart confirmed with patient.     No further follow-up required.  Philippe Desanctis, BSW, MSW, Printmaker Social Work Case Set Designer Health  [facility], Population Health Direct Dial : [phone]  Fax: [phone] Email: [email or web address]: [email or web address]

## 2023-12-28 NOTE — Patient Outreach (Signed)
 Care Coordination   Follow Up Visit Note   12/28/2023  Name: Angie Cain MRN: [id number] DOB: 03-27-1956  Angie Cain is a 68 y.o. year old female who sees Angie Rojelio Caldron, NP for primary care. I spoke with Angie Cain by phone today.  What matters to the patients health and wellness today?  Obtain Grief & Loss Counseling, Supportive Services & Resources.    Goals Addressed               This Visit's Progress     COMPLETED: Obtain Grief & Loss Counseling, Supportive Services & Resources. (pt-stated)   On track     Care Coordination Interventions:   Interventions Today    Flowsheet Row Most Recent Value  Chronic Disease   Chronic disease during today's visit Diabetes, Hypertension (HTN), Other  [Major Depression, Bereavement, Complex Grief, Tobacco Use Disorder, Marital Discord, Bilateral Hearing Loss, Diabetic Neuropathy, Chronic Low Back Pain & Glaucoma, Marital Discord, Limited Support System.]  General Interventions   General Interventions Discussed/Reviewed General Interventions Discussed, General Interventions Reviewed, Annual Eye Exam, Labs, Annual Foot Exam, [employer], Health Screening, Vaccines, Horticulturist, Commercial (DME), Doctor Visits, Communication with, Level of Care  [Communication with Care Team Members.]  Labs Hgb A1c every 3 months, Hgb A1c annually, Kidney Function  [Encouraged Routine Labs.]  Vaccines COVID-19, Flu, Pneumonia, RSV, Shingles, Tetanus/Pertussis/Diphtheria  [Encouraged Annual Vaccinations.]  Doctor Visits Discussed/Reviewed Doctor Visits Discussed, Doctor Visits Reviewed, Annual Wellness Visits, PCP, Specialist  [Encouraged Routine Engagement with Care Team Members.]  Health Screening Bone Density, Colonoscopy, Mammogram  [Encouraged Annual Health Screenings.]  Durable Medical Equipment (DME) Bed side commode, BP Cuff, Glucomoter, Environmental Consultant, Other  [Prescription Eyeglasses, Reacher, Manufacturing Systems Engineer, Cane.]   PCP/Specialist Visits Compliance with follow-up visit  [Encouraged Routine Engagement with Care Team Members.]  Communication with PCP/Specialists, CHARITY FUNDRAISER, Pharmacists, Social Work  [employer] Routine Engagement with Care Team Members.]  Level of Care Adult Daycare, Air Traffic Controller, Assisted Living, Skilled Nursing Facility  [Confirmed Disinterest in Enrollment in Adult Day Care Program or Receiving Assistance with Pursuing Higher Level of Care Placement Options (I.e Assisted Living Versus Skilled Nursing Facility).]  Applications Medicaid, Personal Care Services  [Confirmed Current Recipient of [place]. Confirmed Disinterest in Applying for Personal Care Services.]  Exercise Interventions   Exercise Discussed/Reviewed Exercise Discussed, Exercise Reviewed, Physical Activity, Weight Managment, Assistive device use and maintanence  [Encoruaged Daily Exercise Regimen, as Tolerated.]  Physical Activity Discussed/Reviewed Physical Activity Discussed, Gym, Physical Activity Reviewed, Types of exercise, Home Exercise Program (HEP), PREP  [Encouraged Routine Engagement in Activities of Interest, Inside & Outside the Home.]  Weight Management Weight maintenance  [Encouraged Consideration of Nutritional Consult.]  Education Interventions   Education Provided Provided Education  [employer] Reviewed Educational Material & Encouraged Consideration & Implementation.]  Provided Verbal Education On Nutrition, Exercise, Medication, Foot Care, Eye Care, Labs, Blood Sugar Monitoring, Mental Health/Coping with Illness, Applications, Insurance Plans, [employer], When to see the doctor  [Encouraged Consideration & Implementation.]  Ship Broker, Personal Care Services  [place] Current Recipient of [place] [employer]. Confirmed Disinterest in Applying for Personal Care Services.]  Mental Health Interventions   Mental Health Discussed/Reviewed Mental Health Discussed, Substance Abuse,  Suicide, Other, Mental Health Reviewed, Coping Strategies, Crisis, Anxiety, Depression, Grief and Loss  [Assessed Mental Health & Cognitive Status. Encouraged Self-Enrollment in [facility] to Help Cope with Marital Discord.]  Nutrition Interventions   Nutrition Discussed/Reviewed Nutrition Discussed, Portion sizes, Decreasing sugar intake, Nutrition Reviewed, Increasing proteins, Carbohydrate  meal planning, Decreasing fats, Adding fruits and vegetables, Decreasing salt, Fluid intake, Supplemental nutrition  [Encouraged Heart-Healthy, Diabetic-Friendly, High Protein Diet.]  Pharmacy Interventions   Pharmacy Dicussed/Reviewed Pharmacy Topics Discussed, Affording Medications, Pharmacy Topics Reviewed, Medications and their functions, Medication Adherence  [Confirmed Ability to Afford Prescription Medications.]  Medication Adherence --  [Confirmed Compliance with Prescription Medications.]  Safety Interventions   Safety Discussed/Reviewed Safety Discussed, Safety Reviewed, Fall Risk, Home Safety  [Encouraged Consideration of Home Safety Evaluation.]  Home Safety Assistive Devices, Need for home safety assessment  [Encouraged Routine Use of Assistive & Home Safety Devices.]  Advanced Directive Interventions   Advanced Directives Discussed/Reviewed Advanced Directives Discussed, Advanced Directives Reviewed  [Confirmed Initiation of Advanced Directives (Living Will & Healthcare Power of Attorney Documents) & Encouraged to Provide Copies to Primary Care Provider to Scan into Electronic Medical Record in Epic.]      Active Listening & Reflection Utilized.  Verbalization of Feelings Encouraged.  Emotional Support Provided. Cognitive Behavioral Therapy Initiated. Client-Centered Therapy Implemented. Acceptance & Commitment Therapy Indicated. Encouraged Daily Implementation of Deep Breathing Exercises, Relaxation Techniques & Mindfulness Meditation Strategies. Encouraged Daily Journaling, As a  Means to Express Thoughts, Feelings & Emotions. Encouraged Self-Enrollment in Grief & Loss Support Group of Interest in [facility], from List Provided. Encouraged Engagement with Philippe Desanctis, Licensed Clinical Social Worker with [facility] [phone]), if You Have Questions, Need Assistance, or If Additional Social Work Needs Are Identified in The Near Future.      SDOH assessments and interventions completed:  Yes.  Care Coordination Interventions:  Yes, provided.   Follow up plan: No further intervention required.   Encounter Outcome:  Patient Visit Completed.   Philippe Desanctis, BSW, MSW, Printmaker Social Work Case Manager  [facility], Population Health Direct Dial : [phone]  Fax: [phone] Email: [email or web address] Website: [email or web address]

## 2024-01-28 ENCOUNTER — Other Ambulatory Visit: Payer: Self-pay | Admitting: Student

## 2024-01-28 DIAGNOSIS — Z794 Long term (current) use of insulin: Secondary | ICD-10-CM

## 2024-01-31 NOTE — Telephone Encounter (Signed)
 NO LONGER [facility] PATIENT

## 2024-02-11 ENCOUNTER — Ambulatory Visit (INDEPENDENT_AMBULATORY_CARE_PROVIDER_SITE_OTHER): Payer: 59

## 2024-02-11 ENCOUNTER — Ambulatory Visit (INDEPENDENT_AMBULATORY_CARE_PROVIDER_SITE_OTHER): Payer: 59 | Admitting: Podiatry

## 2024-02-11 ENCOUNTER — Encounter: Payer: Self-pay | Admitting: Podiatry

## 2024-02-11 ENCOUNTER — Other Ambulatory Visit (HOSPITAL_COMMUNITY): Payer: Self-pay

## 2024-02-11 VITALS — Ht 65.0 in | Wt 119.0 lb

## 2024-02-11 DIAGNOSIS — E1142 Type 2 diabetes mellitus with diabetic polyneuropathy: Secondary | ICD-10-CM

## 2024-02-11 DIAGNOSIS — M19071 Primary osteoarthritis, right ankle and foot: Secondary | ICD-10-CM | POA: Diagnosis not present

## 2024-02-11 DIAGNOSIS — M62838 Other muscle spasm: Secondary | ICD-10-CM | POA: Diagnosis not present

## 2024-02-11 DIAGNOSIS — M79671 Pain in right foot: Secondary | ICD-10-CM

## 2024-02-11 MED ORDER — PREGABALIN 25 MG PO CAPS
25.0000 mg | ORAL_CAPSULE | Freq: Two times a day (BID) | ORAL | 0 refills | Status: DC
Start: 2024-02-11 — End: 2024-04-17
  Filled 2024-02-11: qty 60, 30d supply, fill #0

## 2024-02-11 MED ORDER — DICLOFENAC SODIUM 1 % EX GEL
2.0000 g | Freq: Four times a day (QID) | CUTANEOUS | 2 refills | Status: AC
Start: 2024-02-11 — End: ?
  Filled 2024-02-11: qty 100, 13d supply, fill #0

## 2024-02-11 NOTE — Progress Notes (Signed)
 Chief Complaint  Patient presents with   Foot Pain    Pt is here due to bilateral foot pain pt states the pain has been there for a while states she is a diabetic and has neuropathy, states the pain varies and she has a tingling sensation with numbness.    HPI: 68 y.o. female presents today presents today with bilateral foot pain, right greater than left.  Endorses pain around the dorsum of the foot, around the hindfoot and anterior ankle.  Patient is diabetic.  Last A1c 8.0.  Does have history of peripheral arterial disease and has had multiple interventions in the past.  Does endorse continued tobacco use.  Also reports neuropathy.  At times the pain to both lower extremities is a tingling sensation with numbness.  Past Medical History:  Diagnosis Date   Allergy    Anxiety    occasional   Arthritis    Atherosclerotic peripheral vascular disease with intermittent claudication (HCC)    s/p fem-fem bypass 2011. ABI 07/2012 0.5 R 0.65 L   Diabetes mellitus without complication (HCC)    GERD (gastroesophageal reflux disease)    Hyperlipidemia    Hypertension    IDDM (insulin dependent diabetes mellitus) 07/21/2007   July 2011: Femoral-femoral bypass  ABI 08/11/2012 notable for 0.4-0.59 on right ankle suggestive of severe arterial occlusive disease at rest and 0.60-0.7 on left ankle suggestive of moderate arterial occlusive disease at rest.    Iron deficiency anemia 05/08/2017   Low back pain 12/18/2013   Oral thrush 09/29/2022   Personal history of colonic polyps - adenomas 03/07/2014   Rotator cuff impingement syndrome of left shoulder 03/23/2016   Tobacco abuse     Past Surgical History:  Procedure Laterality Date   ABDOMINAL AORTOGRAM W/LOWER EXTREMITY N/A 06/22/2023   Procedure: ABDOMINAL AORTOGRAM W/LOWER EXTREMITY;  Surgeon: Nada Libman, MD;  Location: [facility];  Service: Cardiovascular;  Laterality: N/A;   BREAST BIOPSY Left 02/08/2019   clip placement    VAGINAL HYSTERECTOMY     VEIN BYPASS SURGERY  06/13/2010   vein from left to right hip    Allergies  Allergen Reactions   Bupropion Hives and Swelling   Jardiance [Empagliflozin] Other (See Comments)    Caused a UTI   Metronidazole Itching   Shellfish Allergy Itching and Other (See Comments)    Mouth irritation/itching   Shrimp Extract Itching    ROS denies any nausea, vomiting, fever, chills, chest pain, shortness of breath   Physical Exam: There were no vitals filed for this visit.  General: The patient is alert and oriented x3 in no acute distress.  Dermatology: Skin is warm, dry and supple bilateral lower extremities. Interspaces are clear of maceration and debris.    Vascular: Faintly palpable DP and PT pedal pulses bilaterally. Capillary refill 3 to 5 seconds to the digits.  No appreciable edema.  No erythema or calor.  Pedal hair growth diminished  Neurological: Light touch sensation grossly intact bilateral feet.  Vibratory sensation diminished.  Endorses subjective neuropathy symptoms.  Musculoskeletal Exam: Muscle strength 5/5 for all major muscle groups.  Right foot bunion deformity present.  Pain on palpation of sinus tarsi.  Pain with inversion eversion of subtalar joint.  Pain on palpation of anterior ankle.  Positive Tinel sign right side.  Radiographic Exam:  Decreased osseous mineralization.  Moderate bunion deformity present.  Midfoot arthritis noted.  Subtalar joint arthritis present with significant calcaneal spurring.  Mild ankle arthritis.  Vascular calcifications noted.  Assessment/Plan of Care: 1. Arthritis of right ankle   2. Arthritis of right subtalar joint   3. Diabetic polyneuropathy associated with type 2 diabetes mellitus (HCC)   4. Muscle spasm      Meds ordered this encounter  Medications   pregabalin (LYRICA) 25 MG capsule    Sig: Take 1 capsule (25 mg total) by mouth 2 (two) times daily.    Dispense:  60 capsule    Refill:  0    diclofenac Sodium (VOLTAREN) 1 % GEL    Sig: Apply 2 g topically 4 (four) times daily.    Dispense:  100 g    Refill:  2   None  Discussed clinical findings with patient today.  Radiographs reviewed with patient.  Discussed findings of arthritis.  Recommend use of ASO ankle brace and use of Voltaren gel massaged into painful areas 3-4 times a day.  Consider corticosteroid injection if no improvement, patient wished to defer this today.  Starting patient on Lyrica 25 mg twice daily for neuropathic pain.  Discussed potential side effects.    Patient educated on diabetes. Discussed proper diabetic foot care and discussed risks and complications of disease. Educated patient in depth on reasons to return to the office immediately should he/she discover anything concerning or new on the feet. All questions answered. Discussed proper shoes as well.   Discussed importance of smoking cessation to prevent further complications with diabetes as well as due to her peripheral arterial disease.  Follow-up in 3 to 4 weeks.  Chuck Caban L. Marchia Bond, AACFAS [facility]     [address] [place], [place] [address]                Office [phone]  Fax [phone]

## 2024-02-23 ENCOUNTER — Other Ambulatory Visit (HOSPITAL_COMMUNITY): Payer: Self-pay

## 2024-03-10 ENCOUNTER — Ambulatory Visit: Payer: 59 | Admitting: Podiatry

## 2024-03-24 ENCOUNTER — Ambulatory Visit: Admitting: Podiatry

## 2024-03-30 ENCOUNTER — Other Ambulatory Visit (HOSPITAL_COMMUNITY): Payer: Self-pay

## 2024-04-10 ENCOUNTER — Encounter: Payer: Self-pay | Admitting: Emergency Medicine

## 2024-04-10 ENCOUNTER — Ambulatory Visit
Admission: EM | Admit: 2024-04-10 | Discharge: 2024-04-10 | Disposition: A | Discharge status: Home / Self Care | Attending: Family Medicine | Admitting: Family Medicine

## 2024-04-10 ENCOUNTER — Other Ambulatory Visit (HOSPITAL_COMMUNITY): Payer: Self-pay

## 2024-04-10 DIAGNOSIS — R1031 Right lower quadrant pain: Secondary | ICD-10-CM | POA: Diagnosis not present

## 2024-04-10 DIAGNOSIS — G8929 Other chronic pain: Secondary | ICD-10-CM | POA: Diagnosis not present

## 2024-04-10 DIAGNOSIS — K2100 Gastro-esophageal reflux disease with esophagitis, without bleeding: Secondary | ICD-10-CM

## 2024-04-10 LAB — POCT FASTING CBG KUC MANUAL ENTRY: POCT Glucose (KUC): 135 mg/dL — AB (ref 70–99)

## 2024-04-10 LAB — POCT URINALYSIS DIP (MANUAL ENTRY)
Bilirubin, UA: NEGATIVE
Blood, UA: NEGATIVE
Glucose, UA: 500 mg/dL — AB
Ketones, POC UA: NEGATIVE mg/dL
Leukocytes, UA: NEGATIVE
Nitrite, UA: NEGATIVE
Protein Ur, POC: 30 mg/dL — AB
Spec Grav, UA: 1.015 (ref 1.010–1.025)
Urobilinogen, UA: 0.2 U/dL
pH, UA: 7 (ref 5.0–8.0)

## 2024-04-10 NOTE — Discharge Instructions (Addendum)
 Given the length and duration of abdominal pain symptoms, you require work-up by gastroenterology. I have placed a referral to [facility] gastroenterology for evaluation of lower right abdominal pain on-going for several months.  You can call the gastro office tomorrow to schedule your own appointment or their office will reach out to you via phone.  I have placed a urgent referral for workup of your symptoms. As discussed avoid taking any other ibuprofen  due to being on aspirin  and Plavix . If pain becomes unbearable or severe prior to your workup by gastroenterology I would like for you to go to the nearest emergency department.

## 2024-04-10 NOTE — ED Triage Notes (Signed)
 Pt c/o abdominal in lower right quadrant for several months. Pt denies inury but does state she did fall a couple of months ago.

## 2024-04-10 NOTE — ED Provider Notes (Signed)
 [facility]    CSN: [id number] Arrival date & time: 04/10/24  1436      History   Chief Complaint Chief Complaint  Patient presents with   Abdominal Pain    HPI Angie Cain is a 68 y.o. female.    Abdominal Pain Patient with a history of GERD, hypertension, atherosclerotic peripheral artery disease presents today with a 68-month history of right lower and upper abdominal pain.  Patient is followed by [facility] and reports that she did discuss this with her primary care doctor back in December who at the time diagnosed symptoms as being muscular and prescribed ibuprofen .  She has been taking the ibuprofen  and pain seems to have increased.  She is also having sedation of being choked when eating certain foods.  She is aware that she has GERD and is a smoker as this is a new symptom.  She also endorses that she is not eating at a normal rate.  She struggling to maintain her baseline weight which is 119lb - 120lbs.  She endorses that she feels that she has lost some weight.  She reports a distant history of a colonoscopy but feels that it was greater than 10 or 15 years ago and is unaware what the results were.  Not have any associated vomiting endorses some occasional nausea and no recent change to bowel habits. She has not been referred to a GI doctor.  Past Medical History:  Diagnosis Date   Allergy    Anxiety    occasional   Arthritis    Atherosclerotic peripheral vascular disease with intermittent claudication (HCC)    s/p fem-fem bypass 2011. ABI 07/2012 0.5 R 0.65 L   Diabetes mellitus without complication (HCC)    GERD (gastroesophageal reflux disease)    Hyperlipidemia    Hypertension    IDDM (insulin  dependent diabetes mellitus) 07/21/2007   July 2011: Femoral-femoral bypass  ABI 08/11/2012 notable for 0.4-0.59 on right ankle suggestive of severe arterial occlusive disease at rest and 0.60-0.7 on left ankle suggestive of moderate arterial occlusive  disease at rest.    Iron deficiency anemia 05/08/2017   Low back pain 12/18/2013   Oral thrush 09/29/2022   Personal history of colonic polyps - adenomas 03/07/2014   Rotator cuff impingement syndrome of left shoulder 03/23/2016   Tobacco abuse     Patient Active Problem List   Diagnosis Date Noted   Bereavement 04/27/2023   Hypokalemia 04/27/2023   Herpes zoster without complication 02/03/2023   Glaucoma 11/18/2022   PAD (peripheral artery disease) (HCC) 11/18/2022   Postmenopausal 10/28/2022   Breast cancer screening by mammogram 10/08/2022   Bilateral hearing loss 10/08/2022   Medication nonadherence due to psychosocial problem 09/09/2022   Chronic bilateral inguinal dysesthesias, recurrent  03/18/2022   Hirsutism 02/12/2022   Contact dermatitis 02/12/2022   Diabetic neuropathy (HCC) 01/22/2022   Tinnitus 08/15/2019   Osteoarthritis of right hip 08/15/2019   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   Allergic rhinitis 10/16/2017   Major depression 10/16/2017   Healthcare maintenance 03/18/2017   Osteopenia determined by x-ray 01/25/2015   Bilateral plantar fasciitis 09/30/2014   Hx of adenomatous colonic polyps 03/07/2014   Low back pain 12/18/2013   GERD (gastroesophageal reflux disease) 06/06/2013   Hyperlipidemia associated with type 2 diabetes mellitus (HCC) 06/06/2013   Tobacco use disorder 07/21/2007   Essential hypertension 07/21/2007   Type 2 diabetes mellitus (HCC) 07/20/1993    Past Surgical History:  Procedure Laterality Date  ABDOMINAL AORTOGRAM W/LOWER EXTREMITY N/A 06/22/2023   Procedure: ABDOMINAL AORTOGRAM W/LOWER EXTREMITY;  Surgeon: Margherita Shell, MD;  Location: [facility];  Service: Cardiovascular;  Laterality: N/A;   BREAST BIOPSY Left 02/08/2019   clip placement   VAGINAL HYSTERECTOMY     VEIN BYPASS SURGERY  06/13/2010   vein from left to right hip    OB History   No obstetric history on file.      Home Medications    Prior  to Admission medications   Medication Sig Start Date End Date Taking? Authorizing Provider  Accu-Chek FastClix Lancets MISC CHECK BLOOD SUGAR 3 TIMES A DAY 06/27/21   Toy Freund, MD  Accu-Chek FastClix Lancets MISC Use 1 lancet as directed to check blood sugar levels. 08/31/23     Alcohol  Swabs  (B-D SINGLE USE SWABS  REGULAR) PADS Use 1 pad 6 (six) times daily. 01/19/23   Vita Grip, MD  amLODipine  (NORVASC ) 10 MG tablet Take 1 tablet (10 mg total) by mouth every morning. 08/02/23   Adria Hopkins, MD  amoxicillin -clavulanate (AUGMENTIN ) 875-125 MG tablet Take 1 tablet by mouth every 12 (twelve) hours. 08/09/23   Piontek, Cleveland Dales, MD  aspirin  EC 81 MG tablet Take 81 mg by mouth daily.    [provider]  atorvastatin  (LIPITOR) 40 MG tablet Take 1 tablet (40 mg total) by mouth at bedtime. 01/19/23   Vita Grip, MD  Blood Glucose Calibration (ACCU-CHEK AVIVA) SOLN Use to check controls on diabetic glucose meter monthly 02/26/20   Chundi, Alphonsus Ash, MD  Blood Glucose Monitoring Suppl (ACCU-CHEK AVIVA PLUS) w/Device KIT Check blood sugar up to 3 times a day 02/26/20   Thersia Flax, MD  Blood Glucose Monitoring Suppl (ACCU-CHEK GUIDE ME) w/Device KIT use once a day as directed. 08/31/23     brimonidine  (ALPHAGAN ) 0.2 % ophthalmic solution Place 1 drop into the left eye 2 (two) times daily. 12/30/22     brimonidine  (ALPHAGAN ) 0.2 % ophthalmic solution Instill 1 drop into both eyes twice a day 08/02/23     brimonidine  (ALPHAGAN ) 0.2 % ophthalmic solution Place 1 drop into both eyes 2 (two) times daily. 08/30/23     calcium -vitamin D  (OSCAL WITH D) 500-200 MG-UNIT tablet Take 1 tablet by mouth 2 (two) times daily. 03/17/19   Chundi, Vahini, MD  cetirizine  (ZYRTEC  ALLERGY) 10 MG tablet Take 1 tablet (10 mg total) by mouth daily. 04/29/23   Cleven Dallas, DO  Dapagliflozin  Pro-metFORMIN  ER (XIGDUO  XR) 04-999 MG TB24 Take 1 tablet by mouth daily. 01/19/23   Vita Grip, MD   Dapagliflozin  Pro-metFORMIN  ER (XIGDUO  XR) 04-999 MG TB24 Take 1 tablet by mouth daily for diabetes. 09/15/23     diclofenac  Sodium (VOLTAREN ) 1 % GEL Apply 2 g topically 4 (four) times daily. 02/11/24   Reina Cara, DPM  Dulaglutide  (TRULICITY ) 1.5 MG/0.5ML SOPN Inject 1.5 mg into the skin once a week. 08/02/23   Adria Hopkins, MD  famotidine  (PEPCID ) 20 MG tablet Take 1 tablet (20 mg total) by mouth at bedtime. 04/29/23   Cleven Dallas, DO  glucose blood (ACCU-CHEK GUIDE TEST) test strip Use to check blood sugar as needed 08/31/23     glucose blood (ACCU-CHEK GUIDE TEST) test strip Use to check blood sugar as needed 11/02/23     glucose blood (ACCU-CHEK GUIDE) test strip Check blood sugar 3 times per day 07/07/21   Vita Grip, MD  ibuprofen  (ADVIL ) 600 MG tablet Take 1 tablet (600 mg total)  by mouth every 6 (six) hours as needed. 08/05/23   Jayson Michael, MD  insulin  degludec (TRESIBA  FLEXTOUCH) 100 UNIT/ML FlexTouch Pen Inject 22 Units into the skin daily. 08/02/23   Adria Hopkins, MD  Insulin  Pen Needle 32G X 4 MM MISC Use to inject insulin  once daily at bedtime 04/27/23   Cleven Dallas, DO  latanoprost  (XALATAN ) 0.005 % ophthalmic solution PLACE 1 DROP INTO BOTH EYES AT BEDTIME. 06/27/21   Toy Freund, MD  latanoprost  (XALATAN ) 0.005 % ophthalmic solution Place 1 drop into both eyes at bedtime. 08/02/23     lidocaine  (LIDODERM ) 5 % Place 1 patch onto the skin daily. Remove & Discard patch within 12 hours or as directed by MD 04/29/23   Cleven Dallas, DO  mirtazapine  (REMERON ) 15 MG tablet Take 1 tablet (15 mg total) by mouth at bedtime for appetite/sleep 11/09/23     mupirocin  ointment (BACTROBAN ) 2 % Apply 1 Application topically daily. 04/29/23   Cleven Dallas, DO  omeprazole  (PRILOSEC) 40 MG capsule Take 1 capsule (40 mg total) by mouth daily for GERD 12/27/23     pregabalin  (LYRICA ) 25 MG capsule Take 1 capsule (25 mg total) by mouth 2 (two) times daily. 02/11/24 03/12/24   Reina Cara, DPM    Family History Family History  Problem Relation Age of Onset   Diabetes Mother    Diabetes Father    Diabetes Sister    Hypertension Sister    Diabetes Brother    Hypertension Brother    Diabetes Son    Diabetes Brother    Diabetes Brother    Diabetes Brother    Diabetes Brother    Diabetes Brother    Colon cancer Neg Hx    Esophageal cancer Neg Hx    Stomach cancer Neg Hx    Rectal cancer Neg Hx    Breast cancer Neg Hx     Social History Social History   Tobacco Use   Smoking status: Every Day    Current packs/day: 0.25    Types: Cigarettes    Passive exposure: Current   Smokeless tobacco: Never   Tobacco comments:    Smokes 1 cig/day.    Smoking Cessation Classes, Services, Agencies & Resources Offered.  Vaping Use   Vaping status: Never Used  Substance Use Topics   Alcohol  use: Yes    Alcohol /week: 1.0 - 2.0 standard drink of alcohol     Types: 1 - 2 Glasses of wine per week    Comment: weekly   Drug use: No     Allergies   Bupropion , Jardiance  [empagliflozin ], Metronidazole , Shellfish allergy, and Shrimp extract   Review of Systems Review of Systems  Gastrointestinal:  Positive for abdominal pain.     Physical Exam Triage Vital Signs ED Triage Vitals  Encounter Vitals Group     BP 04/10/24 1553 (!) 152/73     Systolic BP Percentile --      Diastolic BP Percentile --      Pulse Rate 04/10/24 1553 65     Resp 04/10/24 1553 16     Temp 04/10/24 1553 97.7 F (36.5 C)     Temp Source 04/10/24 1553 Oral     SpO2 04/10/24 1553 99 %     Weight 04/10/24 1553 119 lb 0.8 oz (54 kg)     Height --      Head Circumference --      Peak Flow --      Pain Score 04/10/24 1551 8  Pain Loc --      Pain Education --      Exclude from Growth Chart --    No data found.  Updated Vital Signs BP (!) 152/73 (BP Location: Left Arm)   Pulse 65   Temp 97.7 F (36.5 C) (Oral)   Resp 16   Wt 119 lb 0.8 oz (54 kg)   SpO2 99%   BMI  19.81 kg/m   Visual Acuity Right Eye Distance:   Left Eye Distance:   Bilateral Distance:    Right Eye Near:   Left Eye Near:    Bilateral Near:     Physical Exam Vitals reviewed.  Constitutional:      Appearance: She is underweight. She is ill-appearing (Chronically ill-appearing).  HENT:     Head: Normocephalic and atraumatic.  Cardiovascular:     Rate and Rhythm: Normal rate and regular rhythm.  Pulmonary:     Effort: Pulmonary effort is normal.     Breath sounds: Normal breath sounds.  Abdominal:     General: Abdomen is flat.     Palpations: Abdomen is soft.     Tenderness: There is abdominal tenderness in the right upper quadrant, right lower quadrant and epigastric area. There is no right CVA tenderness or left CVA tenderness.    Musculoskeletal:        General: Normal range of motion.     Cervical back: Normal range of motion and neck supple.  Neurological:     General: No focal deficit present.  Psychiatric:        Behavior: Behavior is cooperative.      UC Treatments / Results  Labs (all labs ordered are listed, but only abnormal results are displayed) Labs Reviewed  POCT FASTING CBG [facility] MANUAL ENTRY - Abnormal; Notable for the following components:      Result Value   POCT Glucose ([facility]) 135 (*)    All other components within normal limits  POCT URINALYSIS DIP (MANUAL ENTRY) - Abnormal; Notable for the following components:   Color, UA light yellow (*)    Clarity, UA cloudy (*)    Glucose, UA =500 (*)    Protein Ur, POC =30 (*)    All other components within normal limits    EKG   Radiology No results found.  Procedures Procedures (including critical care time)  Medications Ordered in UC Medications - No data to display  Initial Impression / Assessment and Plan / UC Course  I have reviewed the triage vital signs and the nursing notes.  Pertinent labs & imaging results that were available during my care of the patient were reviewed by me  and considered in my medical decision making (see chart for details).   Abdominal pain, chronic right lower , with red flags symptoms including changes to appetite, symptoms greater than 6 months, unintentional weight loss, patient warrants further evaluation of this chronic problem as it appears to be worsening.  Patient also has associated esophagitis symptoms which are new related to chronic GERD which also warrants further workup and evaluation.  Placing urgent referral to [facility] for further workup and evaluation of symptoms.  Patient is aware that referral has been placed and will contact the office tomorrow to schedule her own appointment if she has not heard from anyone.  Patient given strict ED precautions if any of her symptoms worsen. Final Clinical Impressions(s) / UC Diagnoses   Final diagnoses:  Abdominal pain, chronic, right lower quadrant  Gastroesophageal reflux disease  with esophagitis without hemorrhage     Discharge Instructions      Given the length and duration of abdominal pain symptoms, you require work-up by gastroenterology. I have placed a referral to [facility] gastroenterology for evaluation of lower right abdominal pain on-going for several months.  You can call the gastro office tomorrow to schedule your own appointment or their office will reach out to you via phone.  I have placed a urgent referral for workup of your symptoms. As discussed avoid taking any other ibuprofen  due to being on aspirin  and Plavix . If pain becomes unbearable or severe prior to your workup by gastroenterology I would like for you to go to the nearest emergency department.    ED Prescriptions   None    PDMP not reviewed this encounter.   Buena Carmine, NP 04/10/24 [phone]

## 2024-04-11 ENCOUNTER — Telehealth: Payer: Self-pay | Admitting: Internal Medicine

## 2024-04-11 NOTE — Telephone Encounter (Signed)
 Patient contacted and scheduled. You can close the referral.

## 2024-04-11 NOTE — Telephone Encounter (Signed)
 Urgent referral in [place] from PCP for weight loss, right upper and lower abdominal pain, decreased appetite, and current episodes of food getting stuck in the throat patient has a history of GERD.  Last saw Dr. Willy Harvest in 2018.  Please review and advise scheduling and urgency.  Thanks

## 2024-04-12 ENCOUNTER — Other Ambulatory Visit (HOSPITAL_COMMUNITY): Payer: Self-pay

## 2024-04-17 ENCOUNTER — Other Ambulatory Visit (INDEPENDENT_AMBULATORY_CARE_PROVIDER_SITE_OTHER)

## 2024-04-17 ENCOUNTER — Ambulatory Visit (INDEPENDENT_AMBULATORY_CARE_PROVIDER_SITE_OTHER): Admitting: Physician Assistant

## 2024-04-17 ENCOUNTER — Other Ambulatory Visit (HOSPITAL_COMMUNITY): Payer: Self-pay

## 2024-04-17 ENCOUNTER — Encounter: Payer: Self-pay | Admitting: Physician Assistant

## 2024-04-17 VITALS — BP 146/66 | HR 72 | Ht 65.0 in | Wt 122.0 lb

## 2024-04-17 DIAGNOSIS — R1084 Generalized abdominal pain: Secondary | ICD-10-CM

## 2024-04-17 DIAGNOSIS — R634 Abnormal weight loss: Secondary | ICD-10-CM | POA: Diagnosis not present

## 2024-04-17 DIAGNOSIS — Z860101 Personal history of adenomatous and serrated colon polyps: Secondary | ICD-10-CM | POA: Diagnosis not present

## 2024-04-17 LAB — CBC WITH DIFFERENTIAL/PLATELET
Basophils Absolute: 0.1 10*3/uL (ref 0.0–0.1)
Basophils Relative: 1.1 % (ref 0.0–3.0)
Eosinophils Absolute: 0 10*3/uL (ref 0.0–0.7)
Eosinophils Relative: 0.5 % (ref 0.0–5.0)
HCT: 37.8 % (ref 36.0–46.0)
Hemoglobin: 11.8 g/dL — ABNORMAL LOW (ref 12.0–15.0)
Lymphocytes Relative: 28.9 % (ref 12.0–46.0)
Lymphs Abs: 2 10*3/uL (ref 0.7–4.0)
MCHC: 31.3 g/dL (ref 30.0–36.0)
MCV: 76.3 fl — ABNORMAL LOW (ref 78.0–100.0)
Monocytes Absolute: 0.6 10*3/uL (ref 0.1–1.0)
Monocytes Relative: 8.9 % (ref 3.0–12.0)
Neutro Abs: 4.1 10*3/uL (ref 1.4–7.7)
Neutrophils Relative %: 60.6 % (ref 43.0–77.0)
Platelets: 415 10*3/uL — ABNORMAL HIGH (ref 150.0–400.0)
RBC: 4.95 Mil/uL (ref 3.87–5.11)
RDW: 15.9 % — ABNORMAL HIGH (ref 11.5–15.5)
WBC: 6.8 10*3/uL (ref 4.0–10.5)

## 2024-04-17 LAB — COMPREHENSIVE METABOLIC PANEL WITH GFR
ALT: 5 U/L (ref 0–35)
AST: 10 U/L (ref 0–37)
Albumin: 4 g/dL (ref 3.5–5.2)
Alkaline Phosphatase: 75 U/L (ref 39–117)
BUN: 9 mg/dL (ref 6–23)
CO2: 33 meq/L — ABNORMAL HIGH (ref 19–32)
Calcium: 9.2 mg/dL (ref 8.4–10.5)
Chloride: 97 meq/L (ref 96–112)
Creatinine, Ser: 0.66 mg/dL (ref 0.40–1.20)
GFR: 89.23 mL/min (ref >60.00)
Glucose, Bld: 125 mg/dL — ABNORMAL HIGH (ref 70–99)
Potassium: 2.9 meq/L — ABNORMAL LOW (ref 3.5–5.1)
Sodium: 142 meq/L (ref 135–145)
Total Bilirubin: 0.3 mg/dL (ref 0.2–1.2)
Total Protein: 6.9 g/dL (ref 6.0–8.3)

## 2024-04-17 LAB — LIPASE: Lipase: 21 U/L (ref 11.0–59.0)

## 2024-04-17 MED ORDER — DICYCLOMINE HCL 10 MG PO CAPS
10.0000 mg | ORAL_CAPSULE | Freq: Three times a day (TID) | ORAL | 5 refills | Status: DC
  Filled 2024-04-17: qty 120, 30d supply, fill #0

## 2024-04-17 NOTE — Patient Instructions (Signed)
 Your provider has requested that you go to the basement level for lab work before leaving today. Press "B" on the elevator. The lab is located at the first door on the left as you exit the elevator.  We have sent the following medications to your pharmacy for you to pick up at your convenience: Dicyclomine 10 mg four times daily.   You have been scheduled for a CT scan of the abdomen and pelvis at [facility], 1st floor Radiology. You are scheduled on Monday 04/24/24 at 2 pm. You should arrive 15 minutes prior to your appointment time for registration.    Please follow the written instructions below on the day of your exam:   1) Do not eat anything after 12 pm (4 hours prior to your test)   You may take any medications as prescribed with a small amount of water, if necessary. If you take any of the following medications: METFORMIN , GLUCOPHAGE , GLUCOVANCE, AVANDAMET, RIOMET , FORTAMET , ACTOPLUS MET, JANUMET , GLUMETZA  or METAGLIP, you MAY be asked to HOLD this medication 48 hours AFTER the exam.   The purpose of you drinking the oral contrast is to aid in the visualization of your intestinal tract. The contrast solution may cause some diarrhea. Depending on your individual set of symptoms, you may also receive an intravenous injection of x-ray contrast/dye. Plan on being at [facility] for 45 minutes or longer, depending on the type of exam you are having performed.   If you have any questions regarding your exam or if you need to reschedule, you may call [facility] at [phone] between the hours of 8:00 am and 5:00 pm, Monday-Friday.

## 2024-04-17 NOTE — Progress Notes (Addendum)
 Chief Complaint: Weight loss and generalized abdominal pain  HPI:    Angie Cain is a 68 year old African-American female, known to Dr. Willy Harvest, with a past medical history as listed below including GERD, diabetes and multiple others, who was referred to me by Carolyn Cisco, NP for a complaint of weight loss and generalized abdominal pain.      06/14/2017 colonoscopy done for iron deficiency anemia with 1 polyp in the ascending colon, 3 small polyps in the ascending colon and diverticulosis in the sigmoid colon.  Pathology showed a mixture of diminutive adenomas and mucosal polyps.  Recall placed for 06/14/2022.    06/14/2017 EGD with gastritis and otherwise normal.    04/10/2024 patient seen in the urgent care with this abdominal pain and sent here.    Today, the patient presents to clinic and tells me that really since November she has had an increasing amount of abdominal pain which started on the right side of her abdomen but now is all over.  This is "dull and annoying", sometimes accelerates and she has to flinch due to this.  It really seems to last all day or at least be there most of the time.  Her bowel movements are normal and good.  Tried on Vicodin which made no change.    Also describes a change in her appetite.  She is almost forcing herself to eat in order to maintain her weight.  She does note that she is going through a fair amount of stress with a situation in her life right now.    Denies fever, chills or blood in her stool.  Past Medical History:  Diagnosis Date   Allergy    Anxiety    occasional   Arthritis    Atherosclerotic peripheral vascular disease with intermittent claudication (HCC)    s/p fem-fem bypass 2011. ABI 07/2012 0.5 R 0.65 L   Diabetes mellitus without complication (HCC)    GERD (gastroesophageal reflux disease)    Hyperlipidemia    Hypertension    IDDM (insulin  dependent diabetes mellitus) 07/21/2007   July 2011: Femoral-femoral bypass  ABI  08/11/2012 notable for 0.4-0.59 on right ankle suggestive of severe arterial occlusive disease at rest and 0.60-0.7 on left ankle suggestive of moderate arterial occlusive disease at rest.    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 100 each 5   amLODipine  (NORVASC ) 10 MG tablet Take 1 tablet (10 mg total) by mouth every morning. 90 tablet 3   aspirin  EC 81 MG tablet Take 81 mg by mouth daily.     atorvastatin  (LIPITOR) 40 MG tablet Take 1 tablet (40 mg total) by mouth at bedtime. 90 tablet 3   Blood Glucose Monitoring Suppl (  ACCU-CHEK GUIDE ME) w/Device KIT use once a day as directed. 1 kit 1   brimonidine  (ALPHAGAN ) 0.2 % ophthalmic solution Place 1 drop into both eyes 2 (two) times daily. 30 mL 3   calcium -vitamin D  (OSCAL WITH D) 500-200 MG-UNIT tablet Take 1 tablet by mouth 2 (two) times daily. 60 tablet 3   Dapagliflozin  Pro-metFORMIN  ER (XIGDUO  XR) 04-999 MG TB24 Take 1 tablet by mouth daily for diabetes. 90 tablet 3   diclofenac  Sodium (VOLTAREN ) 1 % GEL Apply 2 g topically 4 (four) times daily. 100 g 2   Dulaglutide  (TRULICITY ) 1.5 MG/0.5ML SOPN Inject 1.5 mg into the skin once a week. 6 mL 3   glucose blood (ACCU-CHEK GUIDE) test strip Check blood  sugar 3 times per day 300 each 1   ibuprofen  (ADVIL ) 600 MG tablet Take 1 tablet (600 mg total) by mouth every 6 (six) hours as needed. 30 tablet 0   insulin  degludec (TRESIBA  FLEXTOUCH) 100 UNIT/ML FlexTouch Pen Inject 22 Units into the skin daily. 15 mL 3   Insulin  Pen Needle 32G X 4 MM MISC Use to inject insulin  once daily at bedtime 100 each 4   latanoprost  (XALATAN ) 0.005 % ophthalmic solution Place 1 drop into both eyes at bedtime. 7.5 mL 3   mupirocin  ointment (BACTROBAN ) 2 % Apply 1 Application topically daily. 22 g 0   No current facility-administered medications for this visit.    Allergies as of 04/17/2024 - Review Complete 04/10/2024  Allergen Reaction Noted   Bupropion  Hives and Swelling 05/18/2018   Jardiance  [empagliflozin ] Other (See Comments) 08/27/2021   Metronidazole  Itching    Shellfish allergy Itching and Other (See Comments) 06/06/2013   Shrimp extract Itching 08/27/2021    Family History  Problem Relation Age of Onset   Diabetes Mother    Diabetes Father    Diabetes Sister    Hypertension Sister    Diabetes Brother    Hypertension Brother    Diabetes Son    Diabetes Brother    Diabetes Brother    Diabetes Brother    Diabetes Brother    Diabetes Brother    Colon cancer Neg Hx    Esophageal cancer Neg Hx    Stomach cancer Neg Hx    Rectal cancer Neg Hx    Breast cancer Neg Hx     Social History   Socioeconomic History   Marital status: Married    Spouse name: Hugo Durand   Number of children: 1   Years of education: 12   Highest education level: 12th grade  Occupational History   Not on file  Tobacco Use   Smoking status: Every Day    Current packs/day: 0.25    Types: Cigarettes    Passive exposure: Current   Smokeless tobacco: Never   Tobacco comments:    Smokes 1 cig/day.    Smoking Cessation Classes, Services, Agencies & Resources Offered.  Vaping Use   Vaping status: Never Used  Substance and Sexual Activity   Alcohol  use: Yes     Alcohol /week: 1.0 - 2.0 standard drink of alcohol     Types: 1 - 2 Glasses of wine per week    Comment: weekly   Drug use: No   Sexual activity: Yes    Partners: Male  Other Topics Concern   Not on file  Social History Narrative   Current Social History 11/22/2020        Patient lives with spouse in an apartment which is 1 story.  There are no steps up to the entrance.       Patient's method of transportation is personal car.      The highest level of education was high school diploma.      The patient currently disabled.      Identified important Relationships are Husband and sons       Pets : None       Interests / Fun: Arts and crafts, music, reading       Current Stressors: depression       Religious / Personal Beliefs: Christian, non-denominational    Social Drivers of Health   Financial Resource Strain: Low Risk  (10/25/2023)   Overall Financial Resource Strain (CARDIA)    Difficulty of Paying Living Expenses: Not hard at all  Food Insecurity: No Food Insecurity (10/25/2023)   Hunger Vital Sign    Worried About Running Out of Food in the Last Year: Never true    Ran Out of Food in the Last Year: Never true  Transportation Needs: No Transportation Needs (10/25/2023)   PRAPARE - Administrator, Civil Service (Medical): No    Lack of Transportation (Non-Medical): No  Physical Activity: Inactive (10/25/2023)   Exercise Vital Sign    Days of Exercise per Week: 0 days    Minutes of Exercise per Session: 0 min  Stress: No Stress Concern Present (10/25/2023)   [employer] of Occupational Health - Occupational Stress Questionnaire    Feeling of Stress : Only a little  Social Connections: Socially Integrated (10/25/2023)   Social Connection and Isolation Panel [NHANES]    Frequency of Communication with Friends and Family: More than three times a week    Frequency of Social Gatherings with Friends and Family: More than three times a week    Attends  Religious Services: More than 4 times per year    Active Member of [employer] or Organizations: Yes    Attends Engineer, structural: More than 4 times per year    Marital Status: Married  Catering manager Violence: Not At Risk (10/25/2023)   Humiliation, Afraid, Rape, and Kick questionnaire    Fear of Current or Ex-Partner: No    Emotionally Abused: No    Physically Abused: No    Sexually Abused: No    Review of Systems:    Constitutional: No fever or chills Skin: No rash or itching Cardiovascular: No chest pain Respiratory: No SOB  Gastrointestinal: See HPI and otherwise negative Genitourinary: No dysuria  Neurological: No headache, dizziness or syncope Musculoskeletal: No new muscle or joint pain Hematologic: No bleeding  Psychiatric: No history of depression or anxiety   Physical Exam:  Vital signs: BP (!) 146/66   Pulse 72   Ht 5\' 5"  (1.651 m)   Wt 122 lb (55.3 kg)   BMI 20.30 kg/m    Constitutional:   Pleasant AA female appears to be in NAD, Well developed, Well nourished, alert and cooperative Head:  Normocephalic and atraumatic. Eyes:   PEERL, EOMI. No icterus. Conjunctiva pink. Ears:  Normal auditory acuity. Neck:  Supple Throat: Oral cavity and pharynx without inflammation, swelling or lesion.  Respiratory: Respirations even and unlabored. Lungs clear to auscultation bilaterally.   No wheezes, crackles, or rhonchi.  Cardiovascular: Normal S1, S2. No MRG. Regular rate and rhythm. No peripheral edema, cyanosis or pallor.  Gastrointestinal:  Soft, nondistended, moderate generalized TTP. No rebound or guarding. Normal bowel sounds. No appreciable masses or hepatomegaly. Rectal:  Not performed.  Msk:  Symmetrical without gross deformities. Without edema, no deformity or joint abnormality.  Neurologic:  Alert and  oriented x4;  grossly normal neurologically.  Skin:   Dry and intact without significant lesions or rashes. Psychiatric: Demonstrates good judgement and  reason without abnormal affect or behaviors.  RELEVANT LABS AND IMAGING: CBC    Component Value Date/Time   WBC 7.0 06/12/2023 1541   RBC 5.38 (H) 06/12/2023 1541   HGB 12.6 06/22/2023 0531   HGB 13.1 07/17/2021 1621   HCT 37.0 06/22/2023 0531   HCT 43.0 07/17/2021 1621   PLT 399 06/12/2023 1541   PLT 351 07/17/2021 1621   MCV 79.2 (L) 06/12/2023 1541   MCV 79 07/17/2021 1621   MCH 24.7 (L) 06/12/2023 1541   MCHC 31.2 06/12/2023 1541   RDW 14.2 06/12/2023 1541   RDW 13.6 07/17/2021 1621   LYMPHSABS 2.2 06/12/2023 1541   LYMPHSABS 2.7 07/17/2021 1621   MONOABS 0.6 06/12/2023 1541   EOSABS 0.0 06/12/2023 1541   EOSABS 0.1 07/17/2021 1621   BASOSABS 0.0 06/12/2023 1541   BASOSABS 0.0 07/17/2021 1621    CMP     Component Value Date/Time   NA 143 06/22/2023 0531   NA 142 04/27/2023 1150   K 3.0 (L) 06/22/2023 0531   CL 102 06/22/2023 0531   CO2 29 06/12/2023 1541   GLUCOSE 108 (H) 06/22/2023 0531   BUN 10 06/22/2023 0531   BUN 12 04/27/2023 1150   CREATININE 0.60 06/22/2023 0531   CREATININE 0.69 08/24/2014 1457   CALCIUM  9.0 06/12/2023 1541   PROT 6.5 04/15/2023 1555   PROT 6.9 07/17/2021 1621   ALBUMIN 3.4 (L) 04/15/2023 1555   ALBUMIN 4.1 07/17/2021 1621   AST 12 (L) 04/15/2023 1555   ALT 8 04/15/2023 1555   ALKPHOS 67 04/15/2023 1555   BILITOT 0.6 04/15/2023 1555   BILITOT 0.3 07/17/2021 1621   GFRNONAA >60 06/12/2023 1541   GFRNONAA >89 12/18/2013 1445   GFRAA 103 12/23/2020 1124   GFRAA >89 12/18/2013 1445    Assessment: 1.  Generalized abdominal pain started on the right side of her abdomen, but tender all over today; consider IBS versus musculoskeletal versus other 2.  Weight loss: Patient struggling to maintain her weight, consider relation to increased social stressors and anxiety/depression worse relation to above 3.  History of adenomatous polyps: Patient overdue for repeat colonoscopy  Plan: 1.  Ordered a CT of the abdomen pelvis with contrast  for further evaluation of weight loss and generalized abdominal pain.  Pending results will pursue colonoscopy which the patient is already due for.  May need to add EGD. 2.  Also ordered labs including CBC, CMP and lipase. 3.  Started Dicyclomine 10 mg 3-4 times a day.  #120 with 3 refills 4.  Patient to follow in clinic per recommendations after imaging and labs above.  Reginal Capra, PA-C [facility] 04/17/2024, 2:43 PM  Cc: Carolyn Cisco, NP   Primary gastroenterologist  Agree w/ APP evaluation as above.  I think reasonable to pursue an EGD with a colonoscopy  if CT fails to reveal a cause.  Kenney Peacemaker, MD, [facility]

## 2024-04-19 ENCOUNTER — Other Ambulatory Visit: Payer: Self-pay | Admitting: *Deleted

## 2024-04-19 ENCOUNTER — Other Ambulatory Visit (HOSPITAL_COMMUNITY): Payer: Self-pay

## 2024-04-19 DIAGNOSIS — R1084 Generalized abdominal pain: Secondary | ICD-10-CM

## 2024-04-19 DIAGNOSIS — R634 Abnormal weight loss: Secondary | ICD-10-CM

## 2024-04-19 MED ORDER — POTASSIUM CHLORIDE CRYS ER 20 MEQ PO TBCR
40.0000 meq | EXTENDED_RELEASE_TABLET | Freq: Every day | ORAL | 0 refills | Status: AC
  Filled 2024-04-19: qty 6, 3d supply, fill #0

## 2024-04-19 NOTE — Progress Notes (Signed)
 Please let patient know that her potassium is a little low.  Please send her in KCl 40 milliequivalents, 1 tab once daily for the next 3 days and then lets recheck her BMP in a week.  Thanks, JL L

## 2024-04-20 ENCOUNTER — Other Ambulatory Visit (HOSPITAL_COMMUNITY): Payer: Self-pay

## 2024-04-24 ENCOUNTER — Ambulatory Visit (HOSPITAL_COMMUNITY)
Admission: RE | Admit: 2024-04-24 | Discharge: 2024-04-24 | Disposition: A | Discharge status: Home / Self Care | Source: Ambulatory Visit | Attending: Physician Assistant | Admitting: Physician Assistant

## 2024-04-24 DIAGNOSIS — R1084 Generalized abdominal pain: Secondary | ICD-10-CM | POA: Diagnosis present

## 2024-04-24 DIAGNOSIS — R634 Abnormal weight loss: Secondary | ICD-10-CM | POA: Insufficient documentation

## 2024-04-24 MED ORDER — IOHEXOL 300 MG/ML  SOLN
80.0000 mL | Freq: Once | INTRAMUSCULAR | Status: AC | PRN
  Administered 2024-04-24: 80 mL via INTRAVENOUS

## 2024-04-24 MED ORDER — IOHEXOL 9 MG/ML PO SOLN
1000.0000 mL | ORAL | Status: AC
  Administered 2024-04-24: 1000 mL via ORAL

## 2024-04-24 MED ORDER — IOHEXOL 9 MG/ML PO SOLN
ORAL | Status: AC
  Filled 2024-04-24: qty 1000

## 2024-04-25 ENCOUNTER — Ambulatory Visit: Payer: Self-pay | Admitting: Physician Assistant

## 2024-05-01 ENCOUNTER — Emergency Department (HOSPITAL_COMMUNITY)
Admission: EM | Admit: 2024-05-01 | Discharge: 2024-05-01 | Disposition: A | Discharge status: Home / Self Care | Attending: Emergency Medicine | Admitting: Emergency Medicine

## 2024-05-01 ENCOUNTER — Emergency Department (HOSPITAL_COMMUNITY)

## 2024-05-01 ENCOUNTER — Telehealth: Payer: Self-pay | Admitting: Physician Assistant

## 2024-05-01 ENCOUNTER — Other Ambulatory Visit: Payer: Self-pay

## 2024-05-01 ENCOUNTER — Encounter (HOSPITAL_COMMUNITY): Payer: Self-pay

## 2024-05-01 DIAGNOSIS — Z7982 Long term (current) use of aspirin: Secondary | ICD-10-CM | POA: Diagnosis not present

## 2024-05-01 DIAGNOSIS — M549 Dorsalgia, unspecified: Secondary | ICD-10-CM | POA: Insufficient documentation

## 2024-05-01 DIAGNOSIS — E119 Type 2 diabetes mellitus without complications: Secondary | ICD-10-CM | POA: Diagnosis not present

## 2024-05-01 DIAGNOSIS — Z720 Tobacco use: Secondary | ICD-10-CM | POA: Diagnosis not present

## 2024-05-01 DIAGNOSIS — I10 Essential (primary) hypertension: Secondary | ICD-10-CM | POA: Insufficient documentation

## 2024-05-01 DIAGNOSIS — R109 Unspecified abdominal pain: Secondary | ICD-10-CM | POA: Insufficient documentation

## 2024-05-01 DIAGNOSIS — Z79899 Other long term (current) drug therapy: Secondary | ICD-10-CM | POA: Insufficient documentation

## 2024-05-01 DIAGNOSIS — Z794 Long term (current) use of insulin: Secondary | ICD-10-CM | POA: Diagnosis not present

## 2024-05-01 LAB — CBC
HCT: 38.3 % (ref 36.0–46.0)
Hemoglobin: 11.9 g/dL — ABNORMAL LOW (ref 12.0–15.0)
MCH: 24.1 pg — ABNORMAL LOW (ref 26.0–34.0)
MCHC: 31.1 g/dL (ref 30.0–36.0)
MCV: 77.5 fL — ABNORMAL LOW (ref 80.0–100.0)
Platelets: 422 10*3/uL — ABNORMAL HIGH (ref 150–400)
RBC: 4.94 MIL/uL (ref 3.87–5.11)
RDW: 15.9 % — ABNORMAL HIGH (ref 11.5–15.5)
WBC: 6 10*3/uL (ref 4.0–10.5)
nRBC: 0 % (ref 0.0–0.2)

## 2024-05-01 LAB — URINALYSIS, ROUTINE W REFLEX MICROSCOPIC
Bilirubin Urine: NEGATIVE
Glucose, UA: >=500 mg/dL — AB
Hgb urine dipstick: NEGATIVE
Ketones, ur: NEGATIVE mg/dL
Leukocytes,Ua: NEGATIVE
Nitrite: NEGATIVE
Protein, ur: 100 mg/dL — AB
Specific Gravity, Urine: 1.022 (ref 1.005–1.030)
pH: 5 (ref 5.0–8.0)

## 2024-05-01 LAB — COMPREHENSIVE METABOLIC PANEL WITH GFR
ALT: 7 U/L (ref 0–44)
AST: 12 U/L — ABNORMAL LOW (ref 15–41)
Albumin: 3.5 g/dL (ref 3.5–5.0)
Alkaline Phosphatase: 87 U/L (ref 38–126)
Anion gap: 9 (ref 5–15)
BUN: 9 mg/dL (ref 8–23)
CO2: 26 mmol/L (ref 22–32)
Calcium: 8.7 mg/dL — ABNORMAL LOW (ref 8.9–10.3)
Chloride: 103 mmol/L (ref 98–111)
Creatinine, Ser: 0.53 mg/dL (ref 0.44–1.00)
GFR, Estimated: >60 mL/min (ref >60)
Glucose, Bld: 128 mg/dL — ABNORMAL HIGH (ref 70–99)
Potassium: 3.2 mmol/L — ABNORMAL LOW (ref 3.5–5.1)
Sodium: 138 mmol/L (ref 135–145)
Total Bilirubin: 0.4 mg/dL (ref 0.0–1.2)
Total Protein: 7.1 g/dL (ref 6.5–8.1)

## 2024-05-01 LAB — LIPASE, BLOOD: Lipase: 28 U/L (ref 11–51)

## 2024-05-01 MED ORDER — POTASSIUM CHLORIDE CRYS ER 20 MEQ PO TBCR
40.0000 meq | EXTENDED_RELEASE_TABLET | Freq: Once | ORAL | Status: AC
  Administered 2024-05-01: 40 meq via ORAL
  Filled 2024-05-01: qty 2

## 2024-05-01 MED ORDER — ONDANSETRON 4 MG PO TBDP
4.0000 mg | ORAL_TABLET | Freq: Once | ORAL | Status: AC
  Administered 2024-05-01: 4 mg via ORAL
  Filled 2024-05-01: qty 1

## 2024-05-01 MED ORDER — OXYCODONE-ACETAMINOPHEN 5-325 MG PO TABS
1.0000 | ORAL_TABLET | Freq: Once | ORAL | Status: AC
  Administered 2024-05-01: 1 via ORAL
  Filled 2024-05-01: qty 1

## 2024-05-01 NOTE — Discharge Instructions (Addendum)
 Your workup today was reassuring.  No concerning findings.  Please follow-up with a gastroenterologist to have the colonoscopy and endoscopy performed

## 2024-05-01 NOTE — Telephone Encounter (Signed)
 PT has been having abdomen pain on the right side under the rib cage/breastbone associated with lower back pain towards the pelvic area. She is going to the ED but she still would like for someone to contact her to further discuss.

## 2024-05-01 NOTE — ED Provider Notes (Signed)
 [place] [facility] AT [facility] Provider Note   CSN: [id number] Arrival date & time: 05/01/24  1027     History  Chief Complaint  Patient presents with   Flank Pain   Back Pain    Dalia Jollie is a 68 y.o. female.  68 year old female presents today for concern of abdominal pain that has been ongoing for the past several months.  Has been seen by gastroenterology.  Had a recent CT scan done which was unrevealing.  She states she was recommended colonoscopy as well as endoscopy but has not had this done yet.  She states she went to urgent care and suspicion was raised for gallstones.  Denies nausea, vomiting, fever.  The history is provided by the patient and the spouse. No language interpreter was used.       Home Medications Prior to Admission medications   Medication Sig Start Date End Date Taking? Authorizing Provider  Accu-Chek FastClix Lancets MISC Use 1 lancet as directed to check blood sugar levels. 08/31/23     amLODipine  (NORVASC ) 10 MG tablet Take 1 tablet (10 mg total) by mouth every morning. 08/02/23   Adria Hopkins, MD  aspirin  EC 81 MG tablet Take 81 mg by mouth daily.    [provider]  atorvastatin  (LIPITOR) 40 MG tablet Take 1 tablet (40 mg total) by mouth at bedtime. 01/19/23   Amponsah, Prosper M, MD  Blood Glucose Monitoring Suppl (ACCU-CHEK GUIDE ME) w/Device KIT use once a day as directed. 08/31/23     brimonidine  (ALPHAGAN ) 0.2 % ophthalmic solution Place 1 drop into both eyes 2 (two) times daily. 08/30/23     calcium -vitamin D  (OSCAL WITH D) 500-200 MG-UNIT tablet Take 1 tablet by mouth 2 (two) times daily. 03/17/19   Chundi, Vahini, MD  Dapagliflozin  Pro-metFORMIN  ER (XIGDUO  XR) 04-999 MG TB24 Take 1 tablet by mouth daily for diabetes. 09/15/23     diclofenac  Sodium (VOLTAREN ) 1 % GEL Apply 2 g topically 4 (four) times daily. 02/11/24   Reina Cara, DPM  dicyclomine  (BENTYL ) 10 MG capsule Take 1 capsule (10 mg total) by  mouth 4 (four) times daily -  before meals and at bedtime. 04/17/24   Graciella Lavender, PA  Dulaglutide  (TRULICITY ) 1.5 MG/0.5ML SOPN Inject 1.5 mg into the skin once a week. 08/02/23   Adria Hopkins, MD  glucose blood (ACCU-CHEK GUIDE) test strip Check blood sugar 3 times per day 07/07/21   Vita Grip, MD  ibuprofen  (ADVIL ) 600 MG tablet Take 1 tablet (600 mg total) by mouth every 6 (six) hours as needed. 08/05/23   Jayson Michael, MD  insulin  degludec (TRESIBA  FLEXTOUCH) 100 UNIT/ML FlexTouch Pen Inject 22 Units into the skin daily. 08/02/23   Adria Hopkins, MD  Insulin  Pen Needle 32G X 4 MM MISC Use to inject insulin  once daily at bedtime 04/27/23   Cleven Dallas, DO  latanoprost  (XALATAN ) 0.005 % ophthalmic solution Place 1 drop into both eyes at bedtime. 08/02/23     mupirocin  ointment (BACTROBAN ) 2 % Apply 1 Application topically daily. 04/29/23   Cleven Dallas, DO  potassium chloride  SA (KLOR-CON  M) 20 MEQ tablet Take 2 tablets (40 mEq total) by mouth daily for 3 days. 04/19/24 04/23/24  Graciella Lavender, PA      Allergies    Bupropion , Jardiance  [empagliflozin ], Metronidazole , Shellfish allergy, and Shrimp extract    Review of Systems   Review of Systems  Constitutional:  Negative for chills and fever.  Gastrointestinal:  Positive for abdominal pain. Negative for nausea and vomiting.  Neurological:  Negative for light-headedness.  All other systems reviewed and are negative.   Physical Exam Updated Vital Signs BP 124/77   Pulse 74   Temp 98.1 F (36.7 C)   Resp 16   Ht 5\' 5"  (1.651 m)   Wt 55.3 kg   SpO2 98%   BMI 20.30 kg/m  Physical Exam Vitals and nursing note reviewed.  Constitutional:      General: She is not in acute distress.    Appearance: Normal appearance. She is not ill-appearing.  HENT:     Head: Normocephalic and atraumatic.     Nose: Nose normal.  Eyes:     Conjunctiva/sclera: Conjunctivae normal.  Cardiovascular:     Rate and  Rhythm: Normal rate and regular rhythm.  Pulmonary:     Effort: Pulmonary effort is normal. No respiratory distress.  Abdominal:     General: There is no distension.     Tenderness: There is no abdominal tenderness. There is no guarding.  Musculoskeletal:        General: No deformity. Normal range of motion.     Cervical back: Normal range of motion.  Skin:    Findings: No rash.  Neurological:     Mental Status: She is alert.     ED Results / Procedures / Treatments   Labs (all labs ordered are listed, but only abnormal results are displayed) Labs Reviewed  COMPREHENSIVE METABOLIC PANEL WITH GFR - Abnormal; Notable for the following components:      Result Value   Potassium 3.2 (*)    Glucose, Bld 128 (*)    Calcium  8.7 (*)    AST 12 (*)    All other components within normal limits  CBC - Abnormal; Notable for the following components:   Hemoglobin 11.9 (*)    MCV 77.5 (*)    MCH 24.1 (*)    RDW 15.9 (*)    Platelets 422 (*)    All other components within normal limits  URINALYSIS, ROUTINE W REFLEX MICROSCOPIC - Abnormal; Notable for the following components:   APPearance HAZY (*)    Glucose, UA >=500 (*)    Protein, ur 100 (*)    Bacteria, UA FEW (*)    All other components within normal limits  LIPASE, BLOOD    EKG None  Radiology US  Abdomen Limited RUQ (LIVER/GB) Result Date: 05/01/2024 CLINICAL DATA:  151470 RUQ abdominal pain 151470. EXAM: ULTRASOUND ABDOMEN LIMITED RIGHT UPPER QUADRANT COMPARISON:  CT scan abdomen and pelvis from 04/24/2024. FINDINGS: Gallbladder: Physiologically distended gallbladder. No abnormal wall thickening. However, there are multiple sessile, mildly hyperechoic nonshadowing polyps along the wall. The largest such polyp measures up to 4 x 4 mm. No pericholecystic free fluid. Common bile duct: Diameter: Up to 4.7 mm.  No intrahepatic bile duct dilation. Liver: No focal lesion identified. Within normal limits in parenchymal echogenicity.  Portal vein is patent on color Doppler imaging with normal direction of blood flow towards the liver. Other: Incidentally seen is a subcentimeter simple cyst in the cortex of the right kidney. IMPRESSION: 1. Multiple sessile gallbladder polyps with largest measuring up to 4 x 4 mm. No routine follow-up is recommended. Please see follow-up recommendation guidelines below. 2. Otherwise unremarkable exam. Management of Incidentally Detected Gallbladder Polyps: Society of Radiologists in Ultrasound Consensus Conference Recommendations (published online June 17, 2021): [email or web address] Extremely low-risk polyps, i.e. pedunculated ball-on-the-wall or thin stalk: <9 mm: no follow-up  10-14 mm: follow-up at 6, 12 and 24 months >15 mm: surgical consult Low-risk polyps, i.e. pedunculated with a thick or wide stalk, or sessile: ?6 mm: no follow-up 7-9 mm follow-up ultrasound at 12 months 10-14 mm: follow-up ultrasound at 6, 12, 24, and 36 months vs surgical consult >15 mm: surgical consult Intermediate risk: focal wall-thickening >64mm adjacent to polyp: <6 mm: follow-up at 6, 12, 24, 36 months vs surgical consult >7 mm: surgical consult Electronically Signed   By: Beula Brunswick M.D.   On: 05/01/2024 14:21    Procedures Procedures    Medications Ordered in ED Medications  oxyCODONE -acetaminophen  (PERCOCET/ROXICET) 5-325 MG per tablet 1 tablet (1 tablet Oral Given 05/01/24 1341)  ondansetron  (ZOFRAN -ODT) disintegrating tablet 4 mg (4 mg Oral Given 05/01/24 1342)    ED Course/ Medical Decision Making/ A&P                                 Medical Decision Making Amount and/or Complexity of Data Reviewed Labs: ordered. Radiology: ordered.  Risk Prescription drug management.   Medical Decision Making / ED Course   This patient presents to the ED for concern of abdominal pain, this involves an extensive number of treatment options, and is a complaint that carries with it a high risk of  complications and morbidity.  The differential diagnosis includes UTI, constipation, pyelonephritis, nephrolithiasis, colitis, pancreatitis  MDM: 68 year old female presents today for concern of abdominal pain.  Concerned about gallstones.  Right upper quadrant ultrasound obtained without any acute concern. Recent CT reviewed.  No acute findings.  She is established and follows with gastroenterology.  They offered endoscopy and colonoscopy they are just waiting on patient to agree to proceed with these.  She states she will call them to schedule these.  She is stable for discharge.  Potassium repletion given.  CBC without leukocytosis.  Mild anemia around her baseline.  CMP with potassium of 3.2, otherwise without acute concern.  UA without evidence of UTI.  Right upper quadrant ultrasound shows gallbladder polyps otherwise no acute finding.  Patient made aware of these findings.    Additional history obtained: -Additional history obtained from gastroenterology telephone encounter -External records from outside source obtained and reviewed including: Chart review including previous notes, labs, imaging, consultation notes   Lab Tests: -I ordered, reviewed, and interpreted labs.   The pertinent results include:   Labs Reviewed  COMPREHENSIVE METABOLIC PANEL WITH GFR - Abnormal; Notable for the following components:      Result Value   Potassium 3.2 (*)    Glucose, Bld 128 (*)    Calcium  8.7 (*)    AST 12 (*)    All other components within normal limits  CBC - Abnormal; Notable for the following components:   Hemoglobin 11.9 (*)    MCV 77.5 (*)    MCH 24.1 (*)    RDW 15.9 (*)    Platelets 422 (*)    All other components within normal limits  URINALYSIS, ROUTINE W REFLEX MICROSCOPIC - Abnormal; Notable for the following components:   APPearance HAZY (*)    Glucose, UA >=500 (*)    Protein, ur 100 (*)    Bacteria, UA FEW (*)    All other components within normal limits   LIPASE, BLOOD      EKG  EKG Interpretation Date/Time:    Ventricular Rate:    PR Interval:    QRS Duration:  QT Interval:    QTC Calculation:   R Axis:      Text Interpretation:           Imaging Studies ordered: I ordered imaging studies including right upper quadrant ultrasound I independently visualized and interpreted imaging. I agree with the radiologist interpretation   Medicines ordered and prescription drug management: Meds ordered this encounter  Medications   oxyCODONE -acetaminophen  (PERCOCET/ROXICET) 5-325 MG per tablet 1 tablet    Refill:  0   ondansetron  (ZOFRAN -ODT) disintegrating tablet 4 mg    -I have reviewed the patients home medicines and have made adjustments as needed   Reevaluation: After the interventions noted above, I reevaluated the patient and found that they have :improved  Co morbidities that complicate the patient evaluation  Past Medical History:  Diagnosis Date   Allergy    Anxiety    occasional   Arthritis    Atherosclerotic peripheral vascular disease with intermittent claudication (HCC)    s/p fem-fem bypass 2011. ABI 07/2012 0.5 R 0.65 L   Diabetes mellitus without complication (HCC)    GERD (gastroesophageal reflux disease)    Hyperlipidemia    Hypertension    IDDM (insulin  dependent diabetes mellitus) 07/21/2007   July 2011: Femoral-femoral bypass  ABI 08/11/2012 notable for 0.4-0.59 on right ankle suggestive of severe arterial occlusive disease at rest and 0.60-0.7 on left ankle suggestive of moderate arterial occlusive disease at rest.    Iron deficiency anemia 05/08/2017   Low back pain 12/18/2013   Oral thrush 09/29/2022   Personal history of colonic polyps - adenomas 03/07/2014   Rotator cuff impingement syndrome of left shoulder 03/23/2016   Tobacco abuse       Dispostion: Discharged in stable condition.  Return precaution discussed.  Patient voices understanding and is in agreement with plan.   Final  Clinical Impression(s) / ED Diagnoses Final diagnoses:  Abdominal pain, unspecified abdominal location    Rx / DC Orders ED Discharge Orders     None         Lucina Sabal, PA-C 05/01/24 1547    Mozell Arias, MD 05/01/24 [phone]

## 2024-05-01 NOTE — Telephone Encounter (Signed)
 The pt wanted to make our office aware that she is on the way to the ED for eval of severe abd pain. I will forward to [place] as an FYI

## 2024-05-01 NOTE — ED Triage Notes (Signed)
 Pt arrives via POV. Pt reports intermittent right flank pain and lower back pain for the past several months. Denies any other associated symptoms. Pt AxOx4.

## 2024-05-02 ENCOUNTER — Emergency Department (HOSPITAL_COMMUNITY)

## 2024-05-02 ENCOUNTER — Emergency Department (HOSPITAL_COMMUNITY)
Admission: EM | Admit: 2024-05-02 | Discharge: 2024-05-02 | Disposition: A | Discharge status: Home / Self Care | Attending: Emergency Medicine | Admitting: Emergency Medicine

## 2024-05-02 ENCOUNTER — Other Ambulatory Visit: Payer: Self-pay

## 2024-05-02 DIAGNOSIS — Z794 Long term (current) use of insulin: Secondary | ICD-10-CM | POA: Insufficient documentation

## 2024-05-02 DIAGNOSIS — I10 Essential (primary) hypertension: Secondary | ICD-10-CM | POA: Insufficient documentation

## 2024-05-02 DIAGNOSIS — E876 Hypokalemia: Secondary | ICD-10-CM | POA: Diagnosis not present

## 2024-05-02 DIAGNOSIS — E119 Type 2 diabetes mellitus without complications: Secondary | ICD-10-CM | POA: Insufficient documentation

## 2024-05-02 DIAGNOSIS — R1011 Right upper quadrant pain: Secondary | ICD-10-CM | POA: Insufficient documentation

## 2024-05-02 DIAGNOSIS — Z7982 Long term (current) use of aspirin: Secondary | ICD-10-CM | POA: Insufficient documentation

## 2024-05-02 DIAGNOSIS — Z79899 Other long term (current) drug therapy: Secondary | ICD-10-CM | POA: Insufficient documentation

## 2024-05-02 LAB — URINALYSIS, ROUTINE W REFLEX MICROSCOPIC
Bilirubin Urine: NEGATIVE
Glucose, UA: 150 mg/dL — AB
Hgb urine dipstick: NEGATIVE
Ketones, ur: NEGATIVE mg/dL
Leukocytes,Ua: NEGATIVE
Nitrite: NEGATIVE
Protein, ur: NEGATIVE mg/dL
pH: 6 (ref 5.0–8.0)

## 2024-05-02 LAB — CBC WITH DIFFERENTIAL/PLATELET
Abs Immature Granulocytes: 0.01 10*3/uL (ref 0.00–0.07)
Basophils Absolute: 0 10*3/uL (ref 0.0–0.1)
Basophils Relative: 1 %
Eosinophils Absolute: 0.1 10*3/uL (ref 0.0–0.5)
Eosinophils Relative: 1 %
HCT: 42.5 % (ref 36.0–46.0)
Hemoglobin: 12.7 g/dL (ref 12.0–15.0)
Immature Granulocytes: 0 %
Lymphocytes Relative: 33 %
Lymphs Abs: 1.8 10*3/uL (ref 0.7–4.0)
MCH: 23.7 pg — ABNORMAL LOW (ref 26.0–34.0)
MCHC: 29.9 g/dL — ABNORMAL LOW (ref 30.0–36.0)
MCV: 79.4 fL — ABNORMAL LOW (ref 80.0–100.0)
Monocytes Absolute: 0.5 10*3/uL (ref 0.1–1.0)
Monocytes Relative: 10 %
Neutro Abs: 3 10*3/uL (ref 1.7–7.7)
Neutrophils Relative %: 55 %
Platelets: 485 10*3/uL — ABNORMAL HIGH (ref 150–400)
RBC: 5.35 MIL/uL — ABNORMAL HIGH (ref 3.87–5.11)
RDW: 16 % — ABNORMAL HIGH (ref 11.5–15.5)
WBC: 5.4 10*3/uL (ref 4.0–10.5)
nRBC: 0 % (ref 0.0–0.2)

## 2024-05-02 LAB — COMPREHENSIVE METABOLIC PANEL WITH GFR
ALT: 9 U/L (ref 0–44)
AST: 14 U/L — ABNORMAL LOW (ref 15–41)
Albumin: 3.8 g/dL (ref 3.5–5.0)
Alkaline Phosphatase: 83 U/L (ref 38–126)
Anion gap: 9 (ref 5–15)
BUN: 12 mg/dL (ref 8–23)
CO2: 27 mmol/L (ref 22–32)
Calcium: 9.1 mg/dL (ref 8.9–10.3)
Chloride: 103 mmol/L (ref 98–111)
Creatinine, Ser: 0.57 mg/dL (ref 0.44–1.00)
GFR, Estimated: >60 mL/min (ref >60)
Glucose, Bld: 71 mg/dL (ref 70–99)
Potassium: 3.4 mmol/L — ABNORMAL LOW (ref 3.5–5.1)
Sodium: 139 mmol/L (ref 135–145)
Total Bilirubin: 0.5 mg/dL (ref 0.0–1.2)
Total Protein: 7.5 g/dL (ref 6.5–8.1)

## 2024-05-02 LAB — LIPASE, BLOOD: Lipase: 32 U/L (ref 11–51)

## 2024-05-02 MED ORDER — ALUM & MAG HYDROXIDE-SIMETH 200-200-20 MG/5ML PO SUSP
30.0000 mL | Freq: Once | ORAL | Status: AC
  Administered 2024-05-02: 30 mL via ORAL
  Filled 2024-05-02: qty 30

## 2024-05-02 MED ORDER — MORPHINE SULFATE (PF) 4 MG/ML IV SOLN
4.0000 mg | Freq: Once | INTRAVENOUS | Status: AC
  Administered 2024-05-02: 4 mg via INTRAVENOUS
  Filled 2024-05-02: qty 1

## 2024-05-02 MED ORDER — LIDOCAINE VISCOUS HCL 2 % MT SOLN
15.0000 mL | Freq: Once | OROMUCOSAL | Status: AC
  Administered 2024-05-02: 15 mL via ORAL
  Filled 2024-05-02: qty 15

## 2024-05-02 MED ORDER — IOHEXOL 300 MG/ML  SOLN
100.0000 mL | Freq: Once | INTRAMUSCULAR | Status: AC | PRN
  Administered 2024-05-02: 100 mL via INTRAVENOUS

## 2024-05-02 MED ORDER — PANTOPRAZOLE SODIUM 40 MG IV SOLR
40.0000 mg | Freq: Once | INTRAVENOUS | Status: AC
  Administered 2024-05-02: 40 mg via INTRAVENOUS
  Filled 2024-05-02: qty 10

## 2024-05-02 MED ORDER — OXYCODONE-ACETAMINOPHEN 5-325 MG PO TABS
1.0000 | ORAL_TABLET | Freq: Once | ORAL | Status: AC
  Administered 2024-05-02: 1 via ORAL
  Filled 2024-05-02: qty 1

## 2024-05-02 NOTE — ED Triage Notes (Signed)
 Pt reports continued right side pain despite pain meds. Also reports headache that comes and goes, not new. Seen yesterday for same. VSS

## 2024-05-02 NOTE — Telephone Encounter (Signed)
 Review of chart shows that patient has returned to [facility].

## 2024-05-02 NOTE — ED Notes (Signed)
Provided pt with gingerale and turkey sandwich.

## 2024-05-02 NOTE — Telephone Encounter (Signed)
 Attempted to reach patient to discuss continued severe abdominal pain. No answer, attempted to leave message. Mailbox full, unable to leave message.

## 2024-05-02 NOTE — Discharge Instructions (Addendum)
 Please follow-up with your GI for endoscopy and colonoscopy regards to your symptoms.  Today your lab work and imaging was ultimately reassuring however you need to follow-up with your GI specialist for definitive management.  Your imaging did show moderate stool and so you may try MiraLAX to help with the passage of the stool but please remain hydrated while using this medication.  If symptoms change or worsen please return to the ER.

## 2024-05-02 NOTE — ED Provider Notes (Signed)
 [place] EMERGENCY DEPARTMENT AT [facility] Provider Note   CSN: [id number] Arrival date & time: 05/02/24  1035     History  Chief Complaint  Patient presents with   Abdominal Pain    Angie Cain is a 68 y.o. female history of diabetes, GERD, hypertension, osteopenia presented with right sided abdominal pain that is present for the past few months.  Patient's had negative CT scan and ultrasound that was done yesterday.  Patient denies any nausea vomiting constipation diarrhea dysuria.  Patient states that the abdominal pain is in her right upper portion and radiates downward into her groin.  Patient denies any bulging or fevers.  Patient denies any trauma.  Patient states he does have some hip pain with it on the right side.  Patient is able to walk without issue.  Patient has been taking her Percocet at home to no relief.  Home Medications Prior to Admission medications   Medication Sig Start Date End Date Taking? Authorizing Provider  Accu-Chek FastClix Lancets MISC Use 1 lancet as directed to check blood sugar levels. 08/31/23     amLODipine  (NORVASC ) 10 MG tablet Take 1 tablet (10 mg total) by mouth every morning. 08/02/23   Adria Hopkins, MD  aspirin  EC 81 MG tablet Take 81 mg by mouth daily.    [provider]  atorvastatin  (LIPITOR) 40 MG tablet Take 1 tablet (40 mg total) by mouth at bedtime. 01/19/23   Amponsah, Prosper M, MD  Blood Glucose Monitoring Suppl (ACCU-CHEK GUIDE ME) w/Device KIT use once a day as directed. 08/31/23     brimonidine  (ALPHAGAN ) 0.2 % ophthalmic solution Place 1 drop into both eyes 2 (two) times daily. 08/30/23     calcium -vitamin D  (OSCAL WITH D) 500-200 MG-UNIT tablet Take 1 tablet by mouth 2 (two) times daily. 03/17/19   Chundi, Vahini, MD  Dapagliflozin  Pro-metFORMIN  ER (XIGDUO  XR) 04-999 MG TB24 Take 1 tablet by mouth daily for diabetes. 09/15/23     diclofenac  Sodium (VOLTAREN ) 1 % GEL Apply 2 g topically 4 (four) times daily.  02/11/24   Reina Cara, DPM  dicyclomine  (BENTYL ) 10 MG capsule Take 1 capsule (10 mg total) by mouth 4 (four) times daily -  before meals and at bedtime. 04/17/24   Graciella Lavender, PA  Dulaglutide  (TRULICITY ) 1.5 MG/0.5ML SOPN Inject 1.5 mg into the skin once a week. 08/02/23   Adria Hopkins, MD  glucose blood (ACCU-CHEK GUIDE) test strip Check blood sugar 3 times per day 07/07/21   Vita Grip, MD  ibuprofen  (ADVIL ) 600 MG tablet Take 1 tablet (600 mg total) by mouth every 6 (six) hours as needed. 08/05/23   Jayson Michael, MD  insulin  degludec (TRESIBA  FLEXTOUCH) 100 UNIT/ML FlexTouch Pen Inject 22 Units into the skin daily. 08/02/23   Adria Hopkins, MD  Insulin  Pen Needle 32G X 4 MM MISC Use to inject insulin  once daily at bedtime 04/27/23   Cleven Dallas, DO  latanoprost  (XALATAN ) 0.005 % ophthalmic solution Place 1 drop into both eyes at bedtime. 08/02/23     mupirocin  ointment (BACTROBAN ) 2 % Apply 1 Application topically daily. 04/29/23   Cleven Dallas, DO  potassium chloride  SA (KLOR-CON  M) 20 MEQ tablet Take 2 tablets (40 mEq total) by mouth daily for 3 days. 04/19/24 04/23/24  Graciella Lavender, PA      Allergies    Bupropion , Jardiance  [empagliflozin ], Metronidazole , Shellfish allergy, and Shrimp extract    Review of Systems   Review of  Systems  Gastrointestinal:  Positive for abdominal pain.    Physical Exam Updated Vital Signs BP (!) 143/70 (BP Location: Left Leg)   Pulse 82   Temp 97.6 F (36.4 C) (Oral)   Resp 16   SpO2 100%  Physical Exam Vitals reviewed.  Constitutional:      General: She is not in acute distress. HENT:     Head: Normocephalic and atraumatic.  Eyes:     Extraocular Movements: Extraocular movements intact.     Conjunctiva/sclera: Conjunctivae normal.     Pupils: Pupils are equal, round, and reactive to light.  Cardiovascular:     Rate and Rhythm: Normal rate and regular rhythm.     Pulses: Normal pulses.     Heart sounds:  Normal heart sounds.     Comments: 2+ bilateral radial/dorsalis pedis pulses with regular rate Pulmonary:     Effort: Pulmonary effort is normal. No respiratory distress.     Breath sounds: Normal breath sounds.  Abdominal:     Palpations: Abdomen is soft.     Tenderness: There is abdominal tenderness in the epigastric area. There is no guarding or rebound. Negative signs include Murphy's sign, Rovsing's sign, McBurney's sign and psoas sign.  Musculoskeletal:        General: Normal range of motion.     Cervical back: Normal range of motion and neck supple.     Comments: 5 out of 5 bilateral grip/leg extension strength Tender when I press on the right trochanteric bursa Negative logroll bilaterally  Skin:    General: Skin is warm and dry.     Capillary Refill: Capillary refill takes less than 2 seconds.  Neurological:     General: No focal deficit present.     Mental Status: She is alert and oriented to person, place, and time.     Comments: Sensation intact in all 4 limbs  Psychiatric:        Mood and Affect: Mood normal.     ED Results / Procedures / Treatments   Labs (all labs ordered are listed, but only abnormal results are displayed) Labs Reviewed  CBC WITH DIFFERENTIAL/PLATELET - Abnormal; Notable for the following components:      Result Value   RBC 5.35 (*)    MCV 79.4 (*)    MCH 23.7 (*)    MCHC 29.9 (*)    RDW 16.0 (*)    Platelets 485 (*)    All other components within normal limits  COMPREHENSIVE METABOLIC PANEL WITH GFR - Abnormal; Notable for the following components:   Potassium 3.4 (*)    AST 14 (*)    All other components within normal limits  URINALYSIS, ROUTINE W REFLEX MICROSCOPIC - Abnormal; Notable for the following components:   Glucose, UA 150 (*)    All other components within normal limits  LIPASE, BLOOD    EKG None  Radiology DG Hip Unilat W or Wo Pelvis 2-3 Views Right Result Date: 05/02/2024 CLINICAL DATA:  68 year old female with  pain radiating toward the hip EXAM: DG HIP (WITH OR WITHOUT PELVIS) 2-3V RIGHT COMPARISON:  None Available. FINDINGS: Bony pelvic ring intact. No acute displaced fracture. Bilateral hips projects normally over the acetabula. Symmetric joint space narrowing of the bilateral hip joints. Vascular calcifications. Unremarkable proximal femurs. Calcific density superior to the greater trochanter on the right, potentially vascular calcification, calcific tendinitis, posttraumatic/inflammatory. IMPRESSION: Negative for acute bony abnormality. Bilateral hip osteoarthritis. Electronically Signed   By: Jaime  Wagner D.O.  On: 05/02/2024 14:06   CT ABDOMEN PELVIS W CONTRAST Result Date: 05/02/2024 CLINICAL DATA:  Upper abdominal pain. EXAM: CT ABDOMEN AND PELVIS WITH CONTRAST TECHNIQUE: Multidetector CT imaging of the abdomen and pelvis was performed using the standard protocol following bolus administration of intravenous contrast. RADIATION DOSE REDUCTION: This exam was performed according to the departmental dose-optimization program which includes automated exposure control, adjustment of the mA and/or kV according to patient size and/or use of iterative reconstruction technique. CONTRAST:  OMNIPAQUE  IOHEXOL  300 MG/ML  SOLN COMPARISON:  CT abdomen pelvis dated 04/24/2024. FINDINGS: Lower chest: The visualized lung bases are clear. No intra-abdominal free air or free fluid. Hepatobiliary: Similar appearance of heterogeneous area of enhancement in the left lobe of the liver with differential perfusion, not characterized on this CT, previously described as hemangioma. No biliary dilatation. The gallbladder is unremarkable. Pancreas: Unremarkable. No pancreatic ductal dilatation or surrounding inflammatory changes. Spleen: Normal in size without focal abnormality. Adrenals/Urinary Tract: The adrenal glands are unremarkable. Small right renal cyst. There is no hydronephrosis on either side. There is symmetric  enhancement and excretion of contrast by both kidneys. The urinary bladder is grossly unremarkable. Stomach/Bowel: There is sigmoid diverticulosis. There is moderate stool throughout the colon. There is no bowel obstruction or active inflammation. The appendix is normal. Vascular/Lymphatic: Advanced aortoiliac atherosclerotic disease. 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 A femorofemoral bypass graft noted which appears patent. No portal venous gas. No adenopathy. Reproductive: Hysterectomy.  No suspicious adnexal masses. Other: None Musculoskeletal: Osteopenia with degenerative changes. No acute osseous pathology. IMPRESSION: 1. No acute intra-abdominal or pelvic pathology. 2. Sigmoid diverticulosis. No bowel obstruction. Normal appendix. 3.  Aortic Atherosclerosis (ICD10-I70.0). Electronically Signed   By: Angus Bark M.D.   On: 05/02/2024 13:44   US  Abdomen Limited RUQ (LIVER/GB) Result Date: 05/01/2024 CLINICAL DATA:  151470 RUQ abdominal pain 151470. EXAM: ULTRASOUND ABDOMEN LIMITED RIGHT UPPER QUADRANT COMPARISON:  CT scan abdomen and pelvis from 04/24/2024. FINDINGS: Gallbladder: Physiologically distended gallbladder. No abnormal wall thickening. However, there are multiple sessile, mildly hyperechoic nonshadowing polyps along the wall. The largest such polyp measures up to 4 x 4 mm. No pericholecystic free fluid. Common bile duct: Diameter: Up to 4.7 mm.  No intrahepatic bile duct dilation. Liver: No focal lesion identified. Within normal limits in parenchymal echogenicity. Portal vein is patent on color Doppler imaging with normal direction of blood flow towards the liver. Other: Incidentally seen is a subcentimeter simple cyst in the cortex of the right kidney. IMPRESSION: 1. Multiple sessile gallbladder polyps with largest measuring up to 4 x 4 mm. No routine follow-up is recommended. Please see follow-up recommendation guidelines below. 2. Otherwise unremarkable exam. Management of  Incidentally Detected Gallbladder Polyps: Society of Radiologists in Ultrasound Consensus Conference Recommendations (published online June 17, 2021): [email or web address] Extremely low-risk polyps, i.e. pedunculated ball-on-the-wall or thin stalk: <9 mm: no follow-up 10-14 mm: follow-up at 6, 12 and 24 months >15 mm: surgical consult Low-risk polyps, i.e. pedunculated with a thick or wide stalk, or sessile: ?6 mm: no follow-up 7-9 mm follow-up ultrasound at 12 months 10-14 mm: follow-up ultrasound at 6, 12, 24, and 36 months vs surgical consult >15 mm: surgical consult Intermediate risk: focal wall-thickening >42mm adjacent to polyp: <6 mm: follow-up at 6, 12, 24, 36 months vs surgical consult >7 mm: surgical consult Electronically Signed   By: Beula Brunswick M.D.   On: 05/01/2024 14:21    Procedures Procedures    Medications Ordered in ED Medications  alum & mag hydroxide-simeth (MAALOX/MYLANTA) 200-200-20 MG/5ML suspension 30 mL (has no administration in time range)    And  lidocaine  (XYLOCAINE ) 2 % viscous mouth solution 15 mL (has no administration in time range)  oxyCODONE -acetaminophen  (PERCOCET/ROXICET) 5-325 MG per tablet 1 tablet (has no administration in time range)  pantoprazole (PROTONIX) injection 40 mg (40 mg Intravenous Given 05/02/24 1133)  morphine  (PF) 4 MG/ML injection 4 mg (4 mg Intravenous Given 05/02/24 1133)  iohexol  (OMNIPAQUE ) 300 MG/ML solution 100 mL (100 mLs Intravenous Contrast Given 05/02/24 1149)    ED Course/ Medical Decision Making/ A&P                                 Medical Decision Making Amount and/or Complexity of Data Reviewed Labs: ordered. Radiology: ordered.  Risk OTC drugs. Prescription drug management.   Angie Cain 68 y.o. presented today for abdominal pain.  Working DDx that I considered at this time includes, but not limited to, gastroenteritis, colitis, small bowel obstruction, appendicitis, cholecystitis, hepatobiliary  pathology, gastritis, PUD, ACS, aortic dissection, diverticulosis/diverticulitis, pancreatitis, nephrolithiasis, medication induced, AAA, UTI, pyelonephritis, PID, ovarian torsion.  R/o DDx: gastroenteritis, colitis, small bowel obstruction, appendicitis, cholecystitis, hepatobiliary pathology, ACS, aortic dissection, diverticulosis/diverticulitis, pancreatitis, nephrolithiasis, medication induced, AAA, UTI, pyelonephritis, PID, ovarian torsion: These are considered less likely due to history of present illness, physical exam, labs/imaging findings.  Review of prior external notes: None  Unique Tests and My Independent Interpretation:  CBC with differential: Unremarkable CMP: Unremarkable Lipase: Unremarkable UA: Unremarkable CT Abd/Pelvis with contrast: No acute pathology Right hip x-ray: No acute pathology  Social Determinants of Health: none  Discussion with Independent Historian: Husband  Discussion of Management of Tests: None  Risk: Medium: prescription drug management  Risk Stratification Score: None  Staffed with Trifan, MD   Plan: On exam patient was no acute distress with stable vitals.  On exam patient does have epigastric tenderness without peritoneal signs along with right sided trochanteric bursa tenderness as well.  Will get x-ray imaging of right hip and after discussion with the patient we will get repeat CT scan.  Pain meds ordered.  Patient's pain did get better with pain meds however once the pain meds began wearing off she began having pain again.  Patient's labs and imaging are reassuring and so at this time do think patient may have gastritis causing her symptoms and recommend strongly that she follows up with her GI specialist to get a scope done.  We did discuss dietary changes to help avoid future exacerbations if this is the case.  Patient also did have some stool throughout her abdomen on the CT scan I recommend she uses MiraLAX to help with this as this may  also be contributing to her abdominal pain.  I did recommend she remains hydrated while on this medication.  Patient was given return precautions. Patient stable for discharge at this time.  Patient verbalized understanding of plan.  This chart was dictated using voice recognition software.  Despite best efforts to proofread,  errors can occur which can change the documentation meaning.        Final Clinical Impression(s) / ED Diagnoses Final diagnoses:  Right upper quadrant abdominal pain    Rx / DC Orders ED Discharge Orders     None         Elex Grimmer 05/02/24 1446    Arvilla Birmingham, MD 05/02/24 [phone]

## 2024-05-02 NOTE — Telephone Encounter (Signed)
 Inbound call from patient stating the ED gave her medication for pain but she is still having severe abdominal pain. Patent is requesting a call back to discuss further. Please advise, thank you.

## 2024-05-03 ENCOUNTER — Other Ambulatory Visit (HOSPITAL_COMMUNITY): Payer: Self-pay

## 2024-05-03 ENCOUNTER — Other Ambulatory Visit: Payer: Self-pay

## 2024-05-03 DIAGNOSIS — R1084 Generalized abdominal pain: Secondary | ICD-10-CM

## 2024-05-03 MED ORDER — NA SULFATE-K SULFATE-MG SULF 17.5-3.13-1.6 GM/177ML PO SOLN
1.0000 | Freq: Once | ORAL | 0 refills | Status: AC
  Filled 2024-05-03: qty 354, 1d supply, fill #0

## 2024-05-03 NOTE — Telephone Encounter (Signed)
 Spoke with patient, she reports that she is still feeling bad. She still has pain to the RUQ. Patient requesting appointment for endoscopy and colonoscopy. Appointment scheduled for 06/14/2024 at 12:30 in [facility]. Discussed prep instructions and prep with patient. Will print instructions and mail them to patient as well. Patient verbalized understanding of all instructions. Advised patient that if abd pain increases or symptoms worsen prior to procedure date to return to ER for further follow up.

## 2024-05-03 NOTE — Telephone Encounter (Signed)
 Patient requesting f/u call in regards to previous note. Please advise.   Thank you

## 2024-05-15 ENCOUNTER — Other Ambulatory Visit (HOSPITAL_COMMUNITY): Payer: Self-pay

## 2024-05-29 ENCOUNTER — Ambulatory Visit (HOSPITAL_COMMUNITY): Payer: 59 | Attending: Surgery

## 2024-05-29 ENCOUNTER — Ambulatory Visit: Payer: 59 | Attending: Nurse Practitioner

## 2024-05-29 ENCOUNTER — Ambulatory Visit (HOSPITAL_COMMUNITY): Payer: 59

## 2024-06-06 ENCOUNTER — Encounter: Payer: Self-pay | Admitting: Internal Medicine

## 2024-06-07 ENCOUNTER — Other Ambulatory Visit (HOSPITAL_COMMUNITY): Payer: Self-pay

## 2024-06-07 MED ORDER — NA SULFATE-K SULFATE-MG SULF 17.5-3.13-1.6 GM/177ML PO SOLN
1.0000 | Freq: Once | ORAL | 0 refills | Status: AC
  Filled 2024-06-07: qty 354, 1d supply, fill #0

## 2024-06-07 NOTE — Telephone Encounter (Signed)
Suprep sent to pharmacy 

## 2024-06-07 NOTE — Telephone Encounter (Signed)
 Patient requesting prep medication be sent to [facility] outpatient pharmacy. Please advise.

## 2024-06-07 NOTE — Addendum Note (Signed)
 Addended by: WILL POWELL CROME on: 06/07/2024 03:41 PM   Modules accepted: Orders

## 2024-06-08 ENCOUNTER — Ambulatory Visit (HOSPITAL_COMMUNITY)
Admission: RE | Admit: 2024-06-08 | Discharge: 2024-06-08 | Disposition: A | Discharge status: Home / Self Care | Source: Ambulatory Visit | Attending: Surgery | Admitting: Surgery

## 2024-06-08 ENCOUNTER — Other Ambulatory Visit (HOSPITAL_COMMUNITY): Payer: Self-pay

## 2024-06-08 DIAGNOSIS — I739 Peripheral vascular disease, unspecified: Secondary | ICD-10-CM

## 2024-06-08 LAB — VAS US ABI WITH/WO TBI
Left ABI: 0.59
Right ABI: 0.54

## 2024-06-13 NOTE — Progress Notes (Unsigned)
 [facility] History and Physical   Primary Care Physician:  Delores Rojelio Caldron, NP   Reason for Procedure:  Weight loss, generalized abdominal pain  Plan:    EGD and colonoscopy     HPI: Angie Cain is a 68 y.o. female seen in the clinic in May by Delon Failing, PA-C, the patient had generalized abdominal pain and weight loss.  CT of the abdomen and pelvis were performed without cause.  EGD and colonoscopy were then recommended and scheduled.  She does have a history of colon polyps     06/14/2017 colonoscopy done for iron deficiency anemia with 1 polyp in the ascending colon, 3 small polyps in the ascending colon and diverticulosis in the sigmoid colon.  Pathology showed a mixture of diminutive adenomas and mucosal polyps.  Recall placed for 06/14/2022.    06/14/2017 EGD with gastritis and otherwise normal.    Past Medical History:  Diagnosis Date   Allergy    Anxiety    occasional   Arthritis    Atherosclerotic peripheral vascular disease with intermittent claudication (HCC)    s/p fem-fem bypass 2011. ABI 07/2012 0.5 R 0.65 L   Diabetes mellitus without complication (HCC)    GERD (gastroesophageal reflux disease)    Hyperlipidemia    Hypertension    IDDM (insulin  dependent diabetes mellitus) 07/21/2007   July 2011: Femoral-femoral bypass  ABI 08/11/2012 notable for 0.4-0.59 on right ankle suggestive of severe arterial occlusive disease at rest and 0.60-0.7 on left ankle suggestive of moderate arterial occlusive disease at rest.    Iron deficiency anemia 05/08/2017   Low back pain 12/18/2013   Oral thrush 09/29/2022   Personal history of colonic polyps - adenomas 03/07/2014   Rotator cuff impingement syndrome of left shoulder 03/23/2016   Tobacco abuse     Past Surgical History:  Procedure Laterality Date   ABDOMINAL AORTOGRAM W/LOWER EXTREMITY N/A 06/22/2023   Procedure: ABDOMINAL AORTOGRAM W/LOWER EXTREMITY;  Surgeon: Serene Gaile ORN, MD;  Location: [facility]  INVASIVE CV LAB;  Service: Cardiovascular;  Laterality: N/A;   BREAST BIOPSY Left 02/08/2019   clip placement   VAGINAL HYSTERECTOMY     VEIN BYPASS SURGERY  06/13/2010   vein from left to right hip     Current Outpatient Medications  Medication Sig Dispense Refill   Accu-Chek FastClix Lancets MISC Use 1 lancet as directed to check blood sugar levels. 100 each 5   amLODipine  (NORVASC ) 10 MG tablet Take 1 tablet (10 mg total) by mouth every morning. 90 tablet 3   aspirin  EC 81 MG tablet Take 81 mg by mouth daily.     atorvastatin  (LIPITOR) 40 MG tablet Take 1 tablet (40 mg total) by mouth at bedtime. 90 tablet 3   Blood Glucose Monitoring Suppl (ACCU-CHEK GUIDE ME) w/Device KIT use once a day as directed. 1 kit 1   brimonidine  (ALPHAGAN ) 0.2 % ophthalmic solution Place 1 drop into both eyes 2 (two) times daily. 30 mL 3   calcium -vitamin D  (OSCAL WITH D) 500-200 MG-UNIT tablet Take 1 tablet by mouth 2 (two) times daily. 60 tablet 3   Dapagliflozin  Pro-metFORMIN  ER (XIGDUO  XR) 04-999 MG TB24 Take 1 tablet by mouth daily for diabetes. 90 tablet 3   diclofenac  Sodium (VOLTAREN ) 1 % GEL Apply 2 g topically 4 (four) times daily. 100 g 2   dicyclomine  (BENTYL ) 10 MG capsule Take 1 capsule (10 mg total) by mouth 4 (four) times daily -  before meals and at bedtime. 120 capsule  5   Dulaglutide  (TRULICITY ) 1.5 MG/0.5ML SOPN Inject 1.5 mg into the skin once a week. 6 mL 3   glucose blood (ACCU-CHEK GUIDE) test strip Check blood sugar 3 times per day 300 each 1   ibuprofen  (ADVIL ) 600 MG tablet Take 1 tablet (600 mg total) by mouth every 6 (six) hours as needed. 30 tablet 0   insulin  degludec (TRESIBA  FLEXTOUCH) 100 UNIT/ML FlexTouch Pen Inject 22 Units into the skin daily. 15 mL 3   Insulin  Pen Needle 32G X 4 MM MISC Use to inject insulin  once daily at bedtime 100 each 4   latanoprost  (XALATAN ) 0.005 % ophthalmic solution Place 1 drop into both eyes at bedtime. 7.5 mL 3   mupirocin  ointment (BACTROBAN )  2 % Apply 1 Application topically daily. 22 g 0   potassium chloride  SA (KLOR-CON  M) 20 MEQ tablet Take 2 tablets (40 mEq total) by mouth daily for 3 days. 6 tablet 0   No current facility-administered medications for this visit.    Allergies as of 06/14/2024 - Review Complete 05/02/2024  Allergen Reaction Noted   Bupropion  Hives and Swelling 05/18/2018   Jardiance  [empagliflozin ] Other (See Comments) 08/27/2021   Metronidazole  Itching    Shellfish allergy Itching and Other (See Comments) 06/06/2013   Shrimp extract Itching 08/27/2021    Family History  Problem Relation Age of Onset   Diabetes Mother    Diabetes Father    Diabetes Sister    Hypertension Sister    Diabetes Brother    Hypertension Brother    Diabetes Son    Diabetes Brother    Diabetes Brother    Diabetes Brother    Diabetes Brother    Diabetes Brother    Colon cancer Neg Hx    Esophageal cancer Neg Hx    Stomach cancer Neg Hx    Rectal cancer Neg Hx    Breast cancer Neg Hx     Social History   Socioeconomic History   Marital status: Married    Spouse name: Kourtnie Sachs   Number of children: 1   Years of education: 12   Highest education level: 12th grade  Occupational History   Not on file  Tobacco Use   Smoking status: Every Day    Current packs/day: 0.25    Types: Cigarettes    Passive exposure: Current   Smokeless tobacco: Never   Tobacco comments:    Smokes 1 cig/day.    Smoking Cessation Classes, Services, Agencies & Resources Offered.  Vaping Use   Vaping status: Never Used  Substance and Sexual Activity   Alcohol  use: Yes    Alcohol /week: 1.0 - 2.0 standard drink of alcohol     Types: 1 - 2 Glasses of wine per week    Comment: weekly   Drug use: No   Sexual activity: Yes    Partners: Male  Other Topics Concern   Not on file  Social History Narrative   Current Social History 11/22/2020        Patient lives with spouse in an apartment which is 1 story. There are no steps up  to the entrance.       Patient's method of transportation is personal car.      The highest level of education was high school diploma.      The patient currently disabled.      Identified important Relationships are Husband and sons       Pets : None  Interests / Fun: Arts and crafts, music, reading       Current Stressors: depression       Religious / Personal Beliefs: Christian, non-denominational    Social Drivers of Health   Financial Resource Strain: Low Risk  (10/25/2023)   Overall Financial Resource Strain (CARDIA)    Difficulty of Paying Living Expenses: Not hard at all  Food Insecurity: No Food Insecurity (10/25/2023)   Hunger Vital Sign    Worried About Running Out of Food in the Last Year: Never true    Ran Out of Food in the Last Year: Never true  Transportation Needs: No Transportation Needs (10/25/2023)   PRAPARE - Administrator, Civil Service (Medical): No    Lack of Transportation (Non-Medical): No  Physical Activity: Inactive (10/25/2023)   Exercise Vital Sign    Days of Exercise per Week: 0 days    Minutes of Exercise per Session: 0 min  Stress: No Stress Concern Present (10/25/2023)   [employer] of Occupational Health - Occupational Stress Questionnaire    Feeling of Stress : Only a little  Social Connections: Socially Integrated (10/25/2023)   Social Connection and Isolation Panel    Frequency of Communication with Friends and Family: More than three times a week    Frequency of Social Gatherings with Friends and Family: More than three times a week    Attends Religious Services: More than 4 times per year    Active Member of [employer] or Organizations: Yes    Attends Engineer, structural: More than 4 times per year    Marital Status: Married  Catering manager Violence: Not At Risk (10/25/2023)   Humiliation, Afraid, Rape, and Kick questionnaire    Fear of Current or Ex-Partner: No    Emotionally Abused: No     Physically Abused: No    Sexually Abused: No    Review of Systems: Positive for *** All other review of systems negative except as mentioned in the HPI.  Physical Exam: Vital signs There were no vitals taken for this visit.  General:   Alert,  Well-developed, well-nourished, pleasant and cooperative in NAD Lungs:  Clear throughout to auscultation.   Heart:  Regular rate and rhythm; no murmurs, clicks, rubs,  or gallops. Abdomen:  Soft, nontender and nondistended. Normal bowel sounds.   Neuro/Psych:  Alert and cooperative. Normal mood and affect. A and O x 3   @Zeven Kocak  CHARLENA Commander, MD, [facility] [phone] (pager) 06/13/2024 6:03 PM@

## 2024-06-14 ENCOUNTER — Other Ambulatory Visit (HOSPITAL_COMMUNITY): Payer: Self-pay

## 2024-06-14 ENCOUNTER — Encounter: Payer: Self-pay | Admitting: Internal Medicine

## 2024-06-14 ENCOUNTER — Ambulatory Visit: Admitting: Internal Medicine

## 2024-06-14 VITALS — BP 149/85 | HR 65 | Temp 97.5°F | Resp 12 | Ht 65.0 in | Wt 122.0 lb

## 2024-06-14 DIAGNOSIS — R1084 Generalized abdominal pain: Secondary | ICD-10-CM

## 2024-06-14 DIAGNOSIS — Z860100 Personal history of colon polyps, unspecified: Secondary | ICD-10-CM | POA: Diagnosis not present

## 2024-06-14 DIAGNOSIS — R634 Abnormal weight loss: Secondary | ICD-10-CM | POA: Diagnosis not present

## 2024-06-14 DIAGNOSIS — D122 Benign neoplasm of ascending colon: Secondary | ICD-10-CM | POA: Diagnosis not present

## 2024-06-14 MED ORDER — NA SULFATE-K SULFATE-MG SULF 17.5-3.13-1.6 GM/177ML PO SOLN
1.0000 | Freq: Once | ORAL | 0 refills | Status: AC
  Filled 2024-06-14: qty 354, 2d supply, fill #0

## 2024-06-14 MED ORDER — SODIUM CHLORIDE 0.9 % IV SOLN: 500.0000 mL | Freq: Once | INTRAVENOUS | Status: DC

## 2024-06-14 NOTE — Patient Instructions (Addendum)
 The esophagus stomach and upper intestine looked normal.  No cause of pain there.  Colonoscopy exam was limited by a poor prep.  Unfortunately the preparation materials you took did not clean out the colon well enough.  I did find and remove 1 small polyp.  No cause of your symptoms was found.  We need to repeat this with extra prep and my staff will schedule that.  I do think your primary care provider could be right that the pain you are having is musculoskeletal and in the abdominal wall.  You have had multiple CT scans and neither of these studies here show a cause for your pain.  Physical exam today suggested that it is abdominal wall pain.  I will make a referral to one of my partners who can treat this with an injection if he agrees with that assessment.  We are busy so we will take some time to be seen.  I appreciate the opportunity to care for you. Lupita CHARLENA Commander, MD, FACG   YOU HAD AN ENDOSCOPIC PROCEDURE TODAY AT THE [facility]:   Refer to the procedure report that was given to you for any specific questions about what was found during the examination.  If the procedure report does not answer your questions, please call your gastroenterologist to clarify.  If you requested that your care partner not be given the details of your procedure findings, then the procedure report has been included in a sealed envelope for you to review at your convenience later.  YOU SHOULD EXPECT: Some feelings of bloating in the abdomen. Passage of more gas than usual.  Walking can help get rid of the air that was put into your GI tract during the procedure and reduce the bloating. If you had a lower endoscopy (such as a colonoscopy or flexible sigmoidoscopy) you may notice spotting of blood in your stool or on the toilet paper. If you underwent a bowel prep for your procedure, you may not have a normal bowel movement for a few days.  Please Note:  You might notice some irritation and congestion  in your nose or some drainage.  This is from the oxygen used during your procedure.  There is no need for concern and it should clear up in a day or so.  SYMPTOMS TO REPORT IMMEDIATELY:  Following lower endoscopy (colonoscopy or flexible sigmoidoscopy):  Excessive amounts of blood in the stool  Significant tenderness or worsening of abdominal pains  Swelling of the abdomen that is new, acute  Fever of 100F or higher  Following upper endoscopy (EGD)  Vomiting of blood or coffee ground material  New chest pain or pain under the shoulder blades  Painful or persistently difficult swallowing  New shortness of breath  Fever of 100F or higher  Black, tarry-looking stools  For urgent or emergent issues, a gastroenterologist can be reached at any hour by calling [phone]. Do not use MyChart messaging for urgent concerns.    DIET:  We do recommend a small meal at first, but then you may proceed to your regular diet.  Drink plenty of fluids but you should avoid alcoholic beverages for 24 hours.  ACTIVITY:  You should plan to take it easy for the rest of today and you should NOT DRIVE or use heavy machinery until tomorrow (because of the sedation medicines used during the test).    FOLLOW UP: Our staff will call the number listed on your records the next business day  following your procedure.  We will call around 7:15- 8:00 am to check on you and address any questions or concerns that you may have regarding the information given to you following your procedure. If we do not reach you, we will leave a message.     If any biopsies were taken you will be contacted by phone or by letter within the next 1-3 weeks.  Please call us  at [phone] if you have not heard about the biopsies in 3 weeks.    SIGNATURES/CONFIDENTIALITY: You and/or your care partner have signed paperwork which will be entered into your electronic medical record.  These signatures attest to the fact that that the  information above on your After Visit Summary has been reviewed and is understood.  Full responsibility of the confidentiality of this discharge information lies with you and/or your care-partner.

## 2024-06-14 NOTE — Progress Notes (Signed)
 Called to room to assist during endoscopic procedure.  Patient ID and intended procedure confirmed with present staff. Received instructions for my participation in the procedure from the performing physician.

## 2024-06-14 NOTE — Progress Notes (Signed)
 Updated medical record.

## 2024-06-14 NOTE — Op Note (Signed)
 [facility] Patient Name: Angie Cain Procedure Date: 06/14/2024 12:15 PM MRN: [id number] Endoscopist: Lupita FORBES Commander , MD, [id number] Age: 68 Referring MD:  Date of Birth: 04-07-1956 Gender: Female Account #: [id number] Procedure:                Upper GI endoscopy Indications:              Generalized abdominal pain, Weight loss reported Medicines:                Monitored Anesthesia Care Procedure:                Pre-Anesthesia Assessment:                           - Prior to the procedure, a History and Physical                            was performed, and patient medications and                            allergies were reviewed. The patient's tolerance of                            previous anesthesia was also reviewed. The risks                            and benefits of the procedure and the sedation                            options and risks were discussed with the patient.                            All questions were answered, and informed consent                            was obtained. Prior Anticoagulants: The patient has                            taken no anticoagulant or antiplatelet agents. ASA                            Grade Assessment: III - A patient with severe                            systemic disease. After reviewing the risks and                            benefits, the patient was deemed in satisfactory                            condition to undergo the procedure.                           After obtaining informed consent, the endoscope was  passed under direct vision. Throughout the                            procedure, the patient's blood pressure, pulse, and                            oxygen saturations were monitored continuously. The                            Olympus scope [id number] was introduced through the                            mouth, and advanced to the second part of duodenum.                             The upper GI endoscopy was accomplished without                            difficulty. The patient tolerated the procedure                            well. Scope In: 1:44:23 PM Scope Out: 1:47:52 PM Total Procedure Duration: 0 hours 3 minutes 29 seconds  Findings:                 The esophagus was normal.                           The stomach was normal.                           The examined duodenum was normal.                           The cardia and gastric fundus were normal on                            retroflexion. Complications:            No immediate complications. Estimated Blood Loss:     Estimated blood loss: none. Impression:               - Normal esophagus.                           - Normal stomach.                           - Normal examined duodenum.                           - No specimens collected. Recommendation:           - Patient has a contact number available for                            emergencies. The signs and symptoms of potential  delayed complications were discussed with the                            patient. Return to normal activities tomorrow.                            Written discharge instructions were provided to the                            patient.                           - Resume previous diet.                           - Continue present medications.                           - See the other procedure note for documentation of                            additional recommendations. Colonoscpy next Lupita FORBES Commander, MD 06/14/2024 2:12:26 PM This report has been signed electronically.

## 2024-06-14 NOTE — Op Note (Signed)
 [facility] Patient Name: Angie Cain Procedure Date: 06/14/2024 12:15 PM MRN: [id number] Endoscopist: Lupita FORBES Commander , MD, [id number] Age: 68 Referring MD:  Date of Birth: Sep 01, 1956 Gender: Female Account #: [id number] Procedure:                Colonoscopy Indications:              Generalized abdominal pain, Weight loss Medicines:                Monitored Anesthesia Care Procedure:                Pre-Anesthesia Assessment:                           - Prior to the procedure, a History and Physical                            was performed, and patient medications and                            allergies were reviewed. The patient's tolerance of                            previous anesthesia was also reviewed. The risks                            and benefits of the procedure and the sedation                            options and risks were discussed with the patient.                            All questions were answered, and informed consent                            was obtained. Prior Anticoagulants: The patient has                            taken no anticoagulant or antiplatelet agents. ASA                            Grade Assessment: III - A patient with severe                            systemic disease. After reviewing the risks and                            benefits, the patient was deemed in satisfactory                            condition to undergo the procedure.                           After obtaining informed consent, the colonoscope  was passed under direct vision. Throughout the                            procedure, the patient's blood pressure, pulse, and                            oxygen saturations were monitored continuously. The                            Olympus Scope SN: [id number] was introduced through                            the anus and advanced to the the cecum, identified                            by  appendiceal orifice and ileocecal valve. The                            colonoscopy was performed with difficulty due to                            poor bowel prep. The patient tolerated the                            procedure well. The quality of the bowel                            preparation was poor. The ileocecal valve,                            appendiceal orifice, and rectum were photographed.                            The bowel preparation used was SUPREP via split                            dose instruction. Scope In: 1:50:55 PM Scope Out: 2:03:00 PM Scope Withdrawal Time: 0 hours 7 minutes 6 seconds  Total Procedure Duration: 0 hours 12 minutes 5 seconds  Findings:                 The perianal and digital rectal examinations were                            normal.                           Adherent mucoid stool was found in the entire                            colon, interfering with visualization. Lavage of                            the area was performed, resulting in no significant  improvement.                           A diminutive polyp was found in the ascending                            colon. The polyp was sessile. The polyp was removed                            with a cold snare. Resection and retrieval were                            complete. Verification of patient identification                            for the specimen was done. Estimated blood loss was                            minimal.                           The exam was otherwise without abnormality on                            direct and retroflexion views. Complications:            No immediate complications. Estimated Blood Loss:     Estimated blood loss was minimal. Impression:               - Preparation of the colon was poor. Adhereent                            stool minimally responsive to lavage                           - Stool in the entire examined colon.                            - One diminutive polyp in the ascending colon,                            removed with a cold snare. Resected and retrieved.                           - The examination was otherwise normal on direct                            and retroflexion views.                           - Personal history of colonic polyps. Recommendation:           - Patient has a contact number available for                            emergencies. The signs and symptoms of potential  delayed complications were discussed with the                            patient. Return to normal activities tomorrow.                            Written discharge instructions were provided to the                            patient.                           - Repeat colonoscopy at the next available                            appointment. DOUBLE PREP                           - OFFICE TO REFER TO DR. CIRIGLIANO RE: ABDOMINAL                            WALL PAIN RIGHT SIDE - EVALUATE FOR POSSIBLE                            INJECTION THERAPY Lupita FORBES Commander, MD 06/14/2024 2:18:18 PM This report has been signed electronically.

## 2024-06-14 NOTE — Progress Notes (Signed)
 Vss nad trans to pacu

## 2024-06-15 ENCOUNTER — Telehealth: Payer: Self-pay

## 2024-06-15 ENCOUNTER — Other Ambulatory Visit (HOSPITAL_COMMUNITY): Payer: Self-pay

## 2024-06-15 MED ORDER — TRESIBA FLEXTOUCH 100 UNIT/ML ~~LOC~~ SOPN
24.0000 [IU] | PEN_INJECTOR | Freq: Every morning | SUBCUTANEOUS | 3 refills | Status: AC
  Filled 2024-06-15: qty 15, 62d supply, fill #0

## 2024-06-15 NOTE — Telephone Encounter (Signed)
 I spoke with Heron and made her an appointment with Dr Sandor Flatter per Dr Avram to eval for abdominal wall pain-right side-eval for possible injection therapy. She request I mail her a reminder which I will do. Confirmed address.   Updated her medicine list-added omeprazole  that her PCP gave her. We also went over colonoscopy prep instructions. She is having to redo the colonoscopy with a 2 day prep.

## 2024-06-15 NOTE — Telephone Encounter (Signed)
 Follow up call to pt, lm for pt to call if having any difficulty with normal activities or eating and drinking.  Also to call if any other questions or concerns.

## 2024-06-20 ENCOUNTER — Ambulatory Visit: Payer: Self-pay | Admitting: Internal Medicine

## 2024-06-20 LAB — SURGICAL PATHOLOGY

## 2024-06-26 ENCOUNTER — Ambulatory Visit: Attending: Surgery | Admitting: Physician Assistant

## 2024-06-26 VITALS — BP 117/69 | HR 80 | Temp 98.1°F | Ht 65.0 in | Wt 119.5 lb

## 2024-06-26 DIAGNOSIS — I70509 Unspecified atherosclerosis of nonautologous biological bypass graft(s) of the extremities, unspecified extremity: Secondary | ICD-10-CM | POA: Diagnosis not present

## 2024-06-26 DIAGNOSIS — I739 Peripheral vascular disease, unspecified: Secondary | ICD-10-CM

## 2024-06-26 NOTE — Progress Notes (Unsigned)
 Office Note   History of Present Illness   Angie Cain is a 68 y.o. ([birth year]) female who presents for surveillance of PAD.  She has a history of femorofemoral bypass in 2011 by Dr. Harvey.  This was done for lifestyle limiting claudication.  At one of her surveillance visits in 2020 for, duplex demonstrated elevated velocities at the inflow and outflow of her bypass.  Angiogram performed in July demonstrated no significant stenosis and we have kept her on close follow-up.  She returns today for follow-up.  She says that she is doing okay.  She denies any significant changes to her medical history since her last follow-up.  She denies any claudication, rest pain, or tissue loss.   Current Outpatient Medications  Medication Sig Dispense Refill   Accu-Chek FastClix Lancets MISC Use 1 lancet as directed to check blood sugar levels. 100 each 5   amLODipine  (NORVASC ) 10 MG tablet Take 1 tablet (10 mg total) by mouth every morning. 90 tablet 3   aspirin  EC 81 MG tablet Take 81 mg by mouth daily.     atorvastatin  (LIPITOR) 40 MG tablet Take 1 tablet (40 mg total) by mouth at bedtime. 90 tablet 3   Blood Glucose Monitoring Suppl (ACCU-CHEK GUIDE ME) w/Device KIT use once a day as directed. 1 kit 1   brimonidine  (ALPHAGAN ) 0.2 % ophthalmic solution Place 1 drop into both eyes 2 (two) times daily. 30 mL 3   calcium -vitamin D  (OSCAL WITH D) 500-200 MG-UNIT tablet Take 1 tablet by mouth 2 (two) times daily. 60 tablet 3   Dapagliflozin  Pro-metFORMIN  ER (XIGDUO  XR) 04-999 MG TB24 Take 1 tablet by mouth daily for diabetes. 90 tablet 3   diclofenac  Sodium (VOLTAREN ) 1 % GEL Apply 2 g topically 4 (four) times daily. (Patient not taking: Reported on 06/14/2024) 100 g 2   dicyclomine  (BENTYL ) 10 MG capsule Take 1 capsule (10 mg total) by mouth 4 (four) times daily -  before meals and at bedtime. 120 capsule 5   Dulaglutide  (TRULICITY ) 1.5 MG/0.5ML SOPN Inject 1.5 mg into the skin once a week. (Patient  not taking: Reported on 06/14/2024) 6 mL 3   glucose blood (ACCU-CHEK GUIDE) test strip Check blood sugar 3 times per day 300 each 1   insulin  degludec (TRESIBA  FLEXTOUCH) 100 UNIT/ML FlexTouch Pen Inject 22 Units into the skin daily. 15 mL 3   insulin  degludec (TRESIBA  FLEXTOUCH) 100 UNIT/ML FlexTouch Pen Inject 24 Units into the skin in the morning. 15 mL 3   Insulin  Pen Needle 32G X 4 MM MISC Use to inject insulin  once daily at bedtime 100 each 4   latanoprost  (XALATAN ) 0.005 % ophthalmic solution Place 1 drop into both eyes at bedtime. 7.5 mL 3   mupirocin  ointment (BACTROBAN ) 2 % Apply 1 Application topically daily. 22 g 0   omeprazole  (PRILOSEC) 40 MG capsule Take 40 mg by mouth daily.     potassium chloride  SA (KLOR-CON  M) 20 MEQ tablet Take 2 tablets (40 mEq total) by mouth daily for 3 days. (Patient not taking: Reported on 06/14/2024) 6 tablet 0   No current facility-administered medications for this visit.    REVIEW OF SYSTEMS (negative unless checked):   Cardiac:  []  Chest pain or chest pressure? []  Shortness of breath upon activity? []  Shortness of breath when lying flat? []  Irregular heart rhythm?  Vascular:  []  Pain in calf, thigh, or hip brought on by walking? []  Pain in feet at night that wakes  you up from your sleep? []  Blood clot in your veins? []  Leg swelling?  Pulmonary:  []  Oxygen at home? []  Productive cough? []  Wheezing?  Neurologic:  []  Sudden weakness in arms or legs? []  Sudden numbness in arms or legs? []  Sudden onset of difficult speaking or slurred speech? []  Temporary loss of vision in one eye? []  Problems with dizziness?  Gastrointestinal:  []  Blood in stool? []  Vomited blood?  Genitourinary:  []  Burning when urinating? []  Blood in urine?  Psychiatric:  []  Major depression  Hematologic:  []  Bleeding problems? []  Problems with blood clotting?  Dermatologic:  []  Rashes or ulcers?  Constitutional:  []  Fever or  chills?  Ear/Nose/Throat:  []  Change in hearing? []  Nose bleeds? []  Sore throat?  Musculoskeletal:  []  Back pain? []  Joint pain? []  Muscle pain?   Physical Examination   Vitals:   06/26/24 1331  BP: 117/69  Pulse: 80  Temp: 98.1 F (36.7 C)  TempSrc: Temporal  SpO2: 96%  Weight: 119 lb 8 oz (54.2 kg)  Height: 5' 5 (1.651 m)   Body mass index is 19.89 kg/m.  General:  WDWN in NAD; vital signs documented above Gait: Not observed HENT: WNL, normocephalic Pulmonary: normal non-labored breathing , without rales, rhonchi,  wheezing Cardiac: Regular Abdomen: soft, NT, no masses Skin: without rashes Vascular Exam/Pulses: Palpable pulse and femorofemoral bypass graft.  Nonpalpable pedal pulses. Extremities: without ischemic changes, without gangrene , without cellulitis; without open wounds;  Musculoskeletal: no muscle wasting or atrophy  Neurologic: A&O X 3;  No focal weakness or paresthesias are detected Psychiatric:  The pt has Normal affect.  Non-Invasive Vascular Imaging ABI (06/08/2024) R:  ABI: 0.54 (0.54),  PT: mono DP: mono TBI: 0.27 L:  ABI: 0.59 (0.55),  PT: mono DP: mono TBI: 0.24  Femorofemoral bypass Duplex (06/08/2024) ---+-------------------+                   PSV cm/sStenosisWaveform  Comments             +------------------+--------+--------+----------+-------------------+  Inflow           179             monophasic                     +------------------+--------+--------+----------+-------------------+  Prox anastomosis  109             monophasic                     +------------------+--------+--------+----------+-------------------+  Proximal graft    37              monophasic                     +------------------+--------+--------+----------+-------------------+  Mid graft         21              monophasic                     +------------------+--------+--------+----------+-------------------+   Distal graft      28              monophasic                     +------------------+--------+--------+----------+-------------------+  Distal anastomosis39              monophasic                     +------------------+--------+--------+----------+-------------------+  Outflow                                    not well visualized  +------------------+--------+--------+----------+-------------------+    Medical Decision Making   Mekiah Wahler is a 68 y.o. female who presents for surveillance of PAD  Based on the patient's vascular studies, her ABIs are unchanged since her last office visit.  Her right ABI 0.54 and left ABI 0.59 Bypass graft duplex demonstrates a patent femorofemoral bypass graft without stenosis.  There are noted velocities within the bypass graft less than 40 cm/s, which is unchanged from her previous duplex 6 months ago On exam she has a palpable pulse in her femorofemoral bypass graft and 2+ femoral pulses.  She has monophasic DP/PT Doppler signals bilaterally She denies any rest pain, claudication, or tissue loss.  She remains ambulatory with a cane Given that she is asymptomatic and her femorofemoral duplex is stable, I do not believe her bypass requires intervention at this time.  We will continue close surveillance with repeat follow-up in 6 months with ABIs and femorofemoral bypass duplex   Ahmed Holster PA-C [facility] Office: [phone]  Clinic MD: [place]

## 2024-06-27 ENCOUNTER — Other Ambulatory Visit: Payer: Self-pay

## 2024-06-27 DIAGNOSIS — I70509 Unspecified atherosclerosis of nonautologous biological bypass graft(s) of the extremities, unspecified extremity: Secondary | ICD-10-CM

## 2024-07-13 ENCOUNTER — Other Ambulatory Visit (HOSPITAL_COMMUNITY): Payer: Self-pay

## 2024-07-13 MED ORDER — TRULICITY 1.5 MG/0.5ML ~~LOC~~ SOAJ
1.5000 mg | SUBCUTANEOUS | 3 refills | Status: AC
  Filled 2024-07-13: qty 6, 84d supply, fill #0

## 2024-07-13 MED ORDER — ACCU-CHEK GUIDE TEST VI STRP
ORAL_STRIP | Freq: Every day | 3 refills | Status: AC
  Filled 2024-07-13: qty 100, 100d supply, fill #0

## 2024-07-13 MED ORDER — BRIMONIDINE TARTRATE 0.2 % OP SOLN
1.0000 [drp] | Freq: Three times a day (TID) | OPHTHALMIC | 3 refills | Status: AC
  Filled 2024-07-13: qty 10, 33d supply, fill #0
  Filled 2024-12-08: qty 10, 33d supply, fill #1

## 2024-07-13 MED ORDER — LATANOPROST 0.005 % OP SOLN
1.0000 [drp] | Freq: Every evening | OPHTHALMIC | 6 refills | Status: AC
  Filled 2024-07-13: qty 2.5, 25d supply, fill #0
  Filled 2024-08-18: qty 2.5, 25d supply, fill #1
  Filled 2024-12-08: qty 2.5, 25d supply, fill #2

## 2024-07-14 ENCOUNTER — Other Ambulatory Visit (HOSPITAL_COMMUNITY): Payer: Self-pay

## 2024-07-18 ENCOUNTER — Other Ambulatory Visit (HOSPITAL_COMMUNITY): Payer: Self-pay

## 2024-07-18 ENCOUNTER — Telehealth: Payer: Self-pay | Admitting: Internal Medicine

## 2024-07-18 MED ORDER — DICYCLOMINE HCL 10 MG PO CAPS
10.0000 mg | ORAL_CAPSULE | Freq: Three times a day (TID) | ORAL | 5 refills | Status: AC
  Filled 2024-07-18: qty 120, 30d supply, fill #0

## 2024-07-18 NOTE — Telephone Encounter (Signed)
 Spoke with patient. She had several questions regarding her bowel prep including when to start the low residue diet and when to start the Miralax prep. Discussed all instructions with patient. She verbalized understanding. Instructed to call back with further questions.    Patient also requesting refill of Dicyclomine . Will resend at this time.

## 2024-07-18 NOTE — Telephone Encounter (Signed)
 Inbound call from patient stated she would like to speak to nurse in regards to prep instructions for upcoming procedure on 07/26/24 and medication she has to take   Patient also stated she needs a refill on medication dicyclomine    Please advise  Thank you

## 2024-07-23 ENCOUNTER — Encounter: Payer: Self-pay | Admitting: Internal Medicine

## 2024-07-24 ENCOUNTER — Other Ambulatory Visit (HOSPITAL_COMMUNITY): Payer: Self-pay

## 2024-07-24 NOTE — Telephone Encounter (Signed)
 Patient needing help with prep instructions. Please advise.

## 2024-07-25 NOTE — Telephone Encounter (Signed)
 I spoke with Angie Cain and she said she fell asleep and didn't do her prep yesterday with the miralax and dulcolax. Please advise if she can double prep today or reschedule her colonoscopy that is set up for tomorrow.

## 2024-07-25 NOTE — Telephone Encounter (Signed)
 Inbound call from patient stating that she never heard from a nurse regarding questions she has about her prep. Patient states she did not start her 2 day prep before her procedure and is requesting a call to discuss. Please advise.

## 2024-07-25 NOTE — Telephone Encounter (Signed)
 She needs to do the double prep over 2 days so needs to reschedule

## 2024-07-25 NOTE — Telephone Encounter (Signed)
 Spoke with patient. Rescheduled to 07/28/24. Reviewed her procedure prep in detail. Questions invited and answered.

## 2024-07-26 ENCOUNTER — Other Ambulatory Visit: Payer: Self-pay

## 2024-07-26 ENCOUNTER — Encounter: Admitting: Internal Medicine

## 2024-07-26 MED ORDER — NA SULFATE-K SULFATE-MG SULF 17.5-3.13-1.6 GM/177ML PO SOLN: ORAL | 0 refills | Status: DC

## 2024-07-26 NOTE — Telephone Encounter (Signed)
 PT needs to have Suprep sent to [facility] [address]. She usually uses the [facility] but she stated that they are closed. Please advise.

## 2024-07-26 NOTE — Telephone Encounter (Signed)
 Called the patient. She found her prep solution. She does not need the prescription sent. Calling [facility] and canceling the prescription just transmitted.

## 2024-07-27 NOTE — Progress Notes (Unsigned)
 [facility] History and Physical   Primary Care Physician:  Delores Rojelio Caldron, NP   Reason for Procedure:    Encounter Diagnosis  Name Primary?   History of colonic polyps Yes     Plan:    Colonoscopy     HPI: Angie Cain is a 68 y.o. female presenting for a colonoscopy for history of colon polyps.  This is in the background of chronic abdominal pain.  She had an attempted colonoscopy recently but the prep was poor so she had a double prep and presents for repeat examination today. A tubular adenoma was removed.   Past Medical History:  Diagnosis Date   Allergy    Anxiety    occasional   Arthritis    Atherosclerotic peripheral vascular disease with intermittent claudication (HCC)    s/p fem-fem bypass 2011. ABI 07/2012 0.5 R 0.65 L   Diabetes mellitus without complication (HCC)    GERD (gastroesophageal reflux disease)    Hyperlipidemia    Hypertension    IDDM (insulin  dependent diabetes mellitus) 07/21/2007   July 2011: Femoral-femoral bypass  ABI 08/11/2012 notable for 0.4-0.59 on right ankle suggestive of severe arterial occlusive disease at rest and 0.60-0.7 on left ankle suggestive of moderate arterial occlusive disease at rest.    Iron deficiency anemia 05/08/2017   Low back pain 12/18/2013   Oral thrush 09/29/2022   Personal history of colonic polyps - adenomas 03/07/2014   PVD (peripheral vascular disease) (HCC)    Rotator cuff impingement syndrome of left shoulder 03/23/2016   Tobacco abuse     Past Surgical History:  Procedure Laterality Date   ABDOMINAL AORTOGRAM W/LOWER EXTREMITY N/A 06/22/2023   Procedure: ABDOMINAL AORTOGRAM W/LOWER EXTREMITY;  Surgeon: Serene Gaile ORN, MD;  Location: [facility];  Service: Cardiovascular;  Laterality: N/A;   BREAST BIOPSY Left 02/08/2019   clip placement   COLONOSCOPY     VAGINAL HYSTERECTOMY     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 100 each 5   brimonidine  (ALPHAGAN ) 0.2 % ophthalmic solution Place 1 drop into both eyes 2 (two) times daily. 30 mL 3   brimonidine  (ALPHAGAN ) 0.2 % ophthalmic solution Instill 1 drop in affected eye(s) in the morning, in the afternoon and in the evening 10 mL 3   dicyclomine  (BENTYL ) 10 MG capsule Take 1 capsule (10 mg total) by mouth 4 (four) times daily -  before meals and at bedtime. 120 capsule 5   glucose blood (ACCU-CHEK GUIDE TEST) test strip Use to test Blood Sugars once daily 300 strip 3   glucose blood (ACCU-CHEK GUIDE) test strip Check blood sugar 3 times per day 300 each 1   Insulin  Pen Needle 32G X 4 MM MISC Use to inject insulin  once daily at bedtime 100 each 4   latanoprost  (XALATAN ) 0.005 % ophthalmic solution Place 1 drop into both eyes at bedtime. 7.5 mL 3   latanoprost  (XALATAN ) 0.005 % ophthalmic solution Instill 1 drop in the affected eye(s) once daily in the evening. 2.5 mL 6   Na Sulfate-K Sulfate-Mg Sulfate concentrate (SUPREP) 17.5-3.13-1.6 GM/177ML SOLN Take twice as directed by physician 354 mL 0   amLODipine  (NORVASC ) 10 MG tablet Take 1 tablet (10 mg total) by mouth every morning. 90 tablet 3   aspirin  EC  81 MG tablet Take 81 mg by mouth daily.     atorvastatin  (LIPITOR) 40 MG tablet Take 1 tablet (40 mg total) by mouth at bedtime. 90 tablet 3   Blood Glucose Monitoring Suppl (ACCU-CHEK GUIDE ME) w/Device KIT use once a day as directed. 1 kit 1   calcium -vitamin D  (OSCAL WITH D) 500-200 MG-UNIT tablet Take 1 tablet by mouth 2 (two) times daily. 60 tablet 3   Dapagliflozin  Pro-metFORMIN  ER (XIGDUO  XR) 04-999 MG TB24 Take 1 tablet by mouth daily for diabetes. 90 tablet 3   diclofenac  Sodium (VOLTAREN ) 1 % GEL Apply 2 g topically 4 (four) times daily. (Patient not taking: Reported on 07/28/2024) 100 g 2   Dulaglutide  (TRULICITY ) 1.5 MG/0.5ML  SOAJ Inject 1.5 mg into the skin once a week on Tuesday. 6 mL 3   Dulaglutide  (TRULICITY ) 1.5 MG/0.5ML SOPN Inject 1.5 mg into the skin once a week. (Patient not taking: Reported on 06/14/2024) 6 mL 3   insulin  degludec (TRESIBA  FLEXTOUCH) 100 UNIT/ML FlexTouch Pen Inject 22 Units into the skin daily. 15 mL 3   insulin  degludec (TRESIBA  FLEXTOUCH) 100 UNIT/ML FlexTouch Pen Inject 24 Units into the skin in the morning. 15 mL 3   mupirocin  ointment (BACTROBAN ) 2 % Apply 1 Application topically daily. (Patient not taking: Reported on 07/28/2024) 22 g 0   omeprazole  (PRILOSEC) 40 MG capsule Take 40 mg by mouth daily. (Patient not taking: Reported on 07/28/2024)     potassium chloride  SA (KLOR-CON  M) 20 MEQ tablet Take 2 tablets (40 mEq total) by mouth daily for 3 days. (Patient not taking: Reported on 06/14/2024) 6 tablet 0   Current Facility-Administered Medications  Medication Dose Route Frequency Provider Last Rate Last Admin   0.9 %  sodium chloride  infusion  500 mL Intravenous Continuous Avram Lupita BRAVO, MD       ondansetron  (ZOFRAN ) 4 mg in sodium chloride  0.9 % 50 mL IVPB  4 mg Intravenous Once Avram Lupita BRAVO, MD 208 mL/hr at 07/28/24 1505 4 mg at 07/28/24 1505    Allergies as of 07/28/2024 - Review Complete 07/28/2024  Allergen Reaction Noted   Bupropion  Hives and Swelling 05/18/2018   Jardiance  [empagliflozin ] Other (See Comments) 08/27/2021   Metronidazole  Itching    Shellfish allergy Itching and Other (See Comments) 06/06/2013   Shrimp extract Itching 08/27/2021    Family History  Problem Relation Age of Onset   Diabetes Mother    Diabetes Father    Diabetes Sister    Hypertension Sister    Diabetes Brother    Hypertension Brother    Diabetes Son    Diabetes Brother    Diabetes Brother    Diabetes Brother    Diabetes Brother    Diabetes Brother    Colon cancer Neg Hx    Esophageal cancer Neg Hx    Stomach cancer Neg Hx    Rectal cancer Neg Hx    Breast cancer Neg Hx      Social History   Socioeconomic History   Marital status: Married    Spouse name: Marilynne Dupuis   Number of children: 1   Years of education: 12   Highest education level: 12th grade  Occupational History   Not on file  Tobacco Use   Smoking status: Some Days    Current packs/day: 0.25    Types: Cigarettes    Passive exposure: Current   Smokeless tobacco: Never   Tobacco comments:    Smokes 1 cig/day.  Smoking Cessation Classes, Services, Agencies & Resources Offered.  Vaping Use   Vaping status: Never Used  Substance and Sexual Activity   Alcohol  use: Not Currently    Alcohol /week: 1.0 - 2.0 standard drink of alcohol     Types: 1 - 2 Glasses of wine per week    Comment: weekly   Drug use: No   Sexual activity: Yes    Partners: Male    Birth control/protection: Post-menopausal  Other Topics Concern   Not on file  Social History Narrative   Current Social History 11/22/2020        Patient lives with spouse in an apartment which is 1 story. There are no steps up to the entrance.       Patient's method of transportation is personal car.      The highest level of education was high school diploma.      The patient currently disabled.      Identified important Relationships are Husband and sons       Pets : None       Interests / Fun: Arts and crafts, music, reading       Current Stressors: depression       Religious / Personal Beliefs: Christian, non-denominational    Social Drivers of Health   Financial Resource Strain: Low Risk  (10/25/2023)   Overall Financial Resource Strain (CARDIA)    Difficulty of Paying Living Expenses: Not hard at all  Food Insecurity: No Food Insecurity (10/25/2023)   Hunger Vital Sign    Worried About Running Out of Food in the Last Year: Never true    Ran Out of Food in the Last Year: Never true  Transportation Needs: No Transportation Needs (10/25/2023)   PRAPARE - Administrator, Civil Service (Medical): No     Lack of Transportation (Non-Medical): No  Physical Activity: Inactive (10/25/2023)   Exercise Vital Sign    Days of Exercise per Week: 0 days    Minutes of Exercise per Session: 0 min  Stress: No Stress Concern Present (10/25/2023)   [employer] of Occupational Health - Occupational Stress Questionnaire    Feeling of Stress : Only a little  Social Connections: Socially Integrated (10/25/2023)   Social Connection and Isolation Panel    Frequency of Communication with Friends and Family: More than three times a week    Frequency of Social Gatherings with Friends and Family: More than three times a week    Attends Religious Services: More than 4 times per year    Active Member of [employer] or Organizations: Yes    Attends Engineer, structural: More than 4 times per year    Marital Status: Married  Catering manager Violence: Not At Risk (10/25/2023)   Humiliation, Afraid, Rape, and Kick questionnaire    Fear of Current or Ex-Partner: No    Emotionally Abused: No    Physically Abused: No    Sexually Abused: No    Review of Systems:  All other review of systems negative except as mentioned in the HPI.  Physical Exam: Vital signs BP 128/77   Pulse 73   Temp 98.4 F (36.9 C) (Temporal)   Ht 5' 5 (1.651 m)   Wt 122 lb (55.3 kg)   BMI 20.30 kg/m   General:   Alert,  Well-developed, well-nourished, pleasant and cooperative in NAD Lungs:  Clear throughout to auscultation.   Heart:  Regular rate and rhythm; no murmurs, clicks, rubs,  or gallops. Abdomen:  Soft, nontender and nondistended. Normal bowel sounds.   Neuro/Psych:  Alert and cooperative. Normal mood and affect. A and O x 3   @Kinza Gouveia  CHARLENA Commander, MD, [facility] [phone] (pager) 07/28/2024 3:13 PM@

## 2024-07-28 ENCOUNTER — Ambulatory Visit (AMBULATORY_SURGERY_CENTER): Admitting: Internal Medicine

## 2024-07-28 ENCOUNTER — Encounter: Payer: Self-pay | Admitting: Internal Medicine

## 2024-07-28 VITALS — BP 110/74 | HR 74 | Temp 98.4°F | Resp 30 | Ht 65.0 in | Wt 122.0 lb

## 2024-07-28 DIAGNOSIS — D123 Benign neoplasm of transverse colon: Secondary | ICD-10-CM

## 2024-07-28 DIAGNOSIS — Z860101 Personal history of adenomatous and serrated colon polyps: Secondary | ICD-10-CM

## 2024-07-28 DIAGNOSIS — K5730 Diverticulosis of large intestine without perforation or abscess without bleeding: Secondary | ICD-10-CM

## 2024-07-28 DIAGNOSIS — D120 Benign neoplasm of cecum: Secondary | ICD-10-CM | POA: Diagnosis not present

## 2024-07-28 DIAGNOSIS — Z860100 Personal history of colon polyps, unspecified: Secondary | ICD-10-CM

## 2024-07-28 DIAGNOSIS — Z1211 Encounter for screening for malignant neoplasm of colon: Secondary | ICD-10-CM | POA: Diagnosis not present

## 2024-07-28 DIAGNOSIS — D122 Benign neoplasm of ascending colon: Secondary | ICD-10-CM

## 2024-07-28 DIAGNOSIS — D124 Benign neoplasm of descending colon: Secondary | ICD-10-CM | POA: Diagnosis not present

## 2024-07-28 MED ORDER — SODIUM CHLORIDE 0.9 % IV SOLN
4.0000 mg | Freq: Once | INTRAVENOUS | Status: AC
  Administered 2024-07-28: 4 mg via INTRAVENOUS

## 2024-07-28 MED ORDER — SODIUM CHLORIDE 0.9 % IV SOLN: 500.0000 mL | INTRAVENOUS | Status: DC

## 2024-07-28 NOTE — Op Note (Signed)
 [facility] Patient Name: Angie Cain Procedure Date: 07/28/2024 3:09 PM MRN: [id number] Endoscopist: Lupita FORBES Commander , MD, [id number] Age: 68 Referring MD:  Date of Birth: 1956-10-11 Gender: Female Account #: [id number] Procedure:                Colonoscopy Indications:              Surveillance: Personal history of colonic polyps                            with unknown histology on last colonoscopy                            (inadequate bowel preparation) less than 3 years                            ago, Last colonoscopy: July 2025 Medicines:                Monitored Anesthesia Care Procedure:                Pre-Anesthesia Assessment:                           - Prior to the procedure, a History and Physical                            was performed, and patient medications and                            allergies were reviewed. The patient's tolerance of                            previous anesthesia was also reviewed. The risks                            and benefits of the procedure and the sedation                            options and risks were discussed with the patient.                            All questions were answered, and informed consent                            was obtained. Prior Anticoagulants: The patient has                            taken no anticoagulant or antiplatelet agents. ASA                            Grade Assessment: III - A patient with severe                            systemic disease. After reviewing the risks and  benefits, the patient was deemed in satisfactory                            condition to undergo the procedure.                           After obtaining informed consent, the colonoscope                            was passed under direct vision. Throughout the                            procedure, the patient's blood pressure, pulse, and                            oxygen saturations were  monitored continuously. The                            PCF-HQ190L Colonoscope [id number] was introduced                            through the anus and advanced to the the cecum,                            identified by appendiceal orifice and ileocecal                            valve. The colonoscopy was somewhat difficult due                            to significant looping. Successful completion of                            the procedure was aided by applying abdominal                            pressure. The patient tolerated the procedure well.                            The quality of the bowel preparation was good. The                            ileocecal valve, appendiceal orifice, and rectum                            were photographed. The bowel preparation used was                            MiraLAx +SUPREP via extended prep with split dose                            instruction. Scope In: 3:20:07 PM Scope Out: 3:44:29 PM Scope Withdrawal Time: 0 hours 19 minutes 16 seconds  Total Procedure Duration: 0 hours 24 minutes  22 seconds  Findings:                 The perianal and digital rectal examinations were                            normal.                           Five sessile polyps were found in the descending                            colon, transverse colon, ascending colon and cecum.                            The polyps were 2 to 5 mm in size. These polyps                            were removed with a cold snare. Resection and                            retrieval were complete. Verification of patient                            identification for the specimen was done. Estimated                            blood loss was minimal.                           Multiple large-mouthed and small-mouthed                            diverticula were found in the sigmoid colon.                           The exam was otherwise without abnormality on                             direct and retroflexion views. Complications:            No immediate complications. Estimated Blood Loss:     Estimated blood loss was minimal. Impression:               - Five 2 to 5 mm polyps in the descending colon, in                            the transverse colon, in the ascending colon and in                            the cecum, removed with a cold snare. Resected and                            retrieved.                           -  Diverticulosis in the sigmoid colon.                           - The examination was otherwise normal on direct                            and retroflexion views.                           - Personal history of colonic polyps. Recommendation:           - Patient has a contact number available for                            emergencies. The signs and symptoms of potential                            delayed complications were discussed with the                            patient. Return to normal activities tomorrow.                            Written discharge instructions were provided to the                            patient.                           - Resume previous diet.                           - Continue present medications.                           - Repeat colonoscopy is recommended for                            surveillance. The colonoscopy date will be                            determined after pathology results from today's                            exam become available for review. Lupita FORBES Commander, MD 07/28/2024 3:55:00 PM This report has been signed electronically.

## 2024-07-28 NOTE — Progress Notes (Unsigned)
 To pacu, VSS. Report to [email or web address].tb

## 2024-07-28 NOTE — Progress Notes (Signed)
 Pt's states no medical or surgical changes since previsit or office visit.

## 2024-07-28 NOTE — Progress Notes (Unsigned)
 Called to room to assist during endoscopic procedure.  Patient ID and intended procedure confirmed with present staff. Received instructions for my participation in the procedure from the performing physician.

## 2024-07-28 NOTE — Patient Instructions (Addendum)
 Good prep!  Five small polyps removed. I will let you know pathology results and when to have another routine colonoscopy by mail and/or My Chart.  You also have a condition called diverticulosis - common and not usually a problem. Please read the handout provided.  I appreciate the opportunity to care for you. Lupita CHARLENA Commander, MD, [facility]  Resume previous diet Continue present medications Await pathology results  Handouts/information given for polyps, diverticulosis  YOU HAD AN ENDOSCOPIC PROCEDURE TODAY AT THE [facility]:   Refer to the procedure report that was given to you for any specific questions about what was found during the examination.  If the procedure report does not answer your questions, please call your gastroenterologist to clarify.  If you requested that your care partner not be given the details of your procedure findings, then the procedure report has been included in a sealed envelope for you to review at your convenience later.  YOU SHOULD EXPECT: Some feelings of bloating in the abdomen. Passage of more gas than usual.  Walking can help get rid of the air that was put into your GI tract during the procedure and reduce the bloating. If you had a lower endoscopy (such as a colonoscopy or flexible sigmoidoscopy) you may notice spotting of blood in your stool or on the toilet paper. If you underwent a bowel prep for your procedure, you may not have a normal bowel movement for a few days.  Please Note:  You might notice some irritation and congestion in your nose or some drainage.  This is from the oxygen used during your procedure.  There is no need for concern and it should clear up in a day or so.  SYMPTOMS TO REPORT IMMEDIATELY:  Following lower endoscopy (colonoscopy):  Excessive amounts of blood in the stool  Significant tenderness or worsening of abdominal pains  Swelling of the abdomen that is new, acute  Fever of 100F or higher  For urgent or  emergent issues, a gastroenterologist can be reached at any hour by calling [phone]. Do not use MyChart messaging for urgent concerns.   DIET:  We do recommend a small meal at first, but then you may proceed to your regular diet.  Drink plenty of fluids but you should avoid alcoholic beverages for 24 hours.  ACTIVITY:  You should plan to take it easy for the rest of today and you should NOT DRIVE or use heavy machinery until tomorrow (because of the sedation medicines used during the test).    FOLLOW UP: Our staff will call the number listed on your records the next business day following your procedure.  We will call around 7:15- 8:00 am to check on you and address any questions or concerns that you may have regarding the information given to you following your procedure. If we do not reach you, we will leave a message.     If any biopsies were taken you will be contacted by phone or by letter within the next 1-3 weeks.  Please call us  at [phone] if you have not heard about the biopsies in 3 weeks.   SIGNATURES/CONFIDENTIALITY: You and/or your care partner have signed paperwork which will be entered into your electronic medical record.  These signatures attest to the fact that that the information above on your After Visit Summary has been reviewed and is understood.  Full responsibility of the confidentiality of this discharge information lies with you and/or your care-partner.

## 2024-07-31 ENCOUNTER — Telehealth: Payer: Self-pay | Admitting: *Deleted

## 2024-07-31 NOTE — Telephone Encounter (Signed)
  Follow up Call-     07/28/2024    2:19 PM 06/14/2024   12:52 PM  Call back number  Post procedure Call Back phone  # [phone]  Permission to leave phone message Yes Yes     Patient questions:  Do you have a fever, pain , or abdominal swelling? No. Pain Score  0 *  Have you tolerated food without any problems? Yes.    Have you been able to return to your normal activities? Yes.    Do you have any questions about your discharge instructions: Diet   No. Medications  No. Follow up visit  No.  Do you have questions or concerns about your Care? No.  Actions: * If pain score is 4 or above: No action needed, pain <4.

## 2024-08-02 ENCOUNTER — Other Ambulatory Visit (HOSPITAL_COMMUNITY): Payer: Self-pay

## 2024-08-02 LAB — SURGICAL PATHOLOGY

## 2024-08-02 MED ORDER — MIRTAZAPINE 7.5 MG PO TABS
7.5000 mg | ORAL_TABLET | Freq: Every day | ORAL | 3 refills | Status: AC
  Filled 2024-08-02 (×2): qty 90, 90d supply, fill #0

## 2024-08-02 MED ORDER — MIRTAZAPINE 7.5 MG PO TABS
7.5000 mg | ORAL_TABLET | Freq: Every day | ORAL | 3 refills | Status: DC
  Filled 2024-08-02: qty 90, 90d supply, fill #0

## 2024-08-03 ENCOUNTER — Ambulatory Visit: Payer: Self-pay | Admitting: Internal Medicine

## 2024-08-03 NOTE — Telephone Encounter (Signed)
 Patient states she is experiencing pelvic pain as well as knee pain. Please advise.   Thank you

## 2024-08-04 NOTE — Telephone Encounter (Signed)
 Inbound call from patient stating she's been experiencing some pelvic pain and knee pain as well. Requesting a call back   Please advise  Thank you

## 2024-08-04 NOTE — Telephone Encounter (Signed)
 Returned patient call & she has complaints of left knee and lower back/pelvic pain that started prior to her colonoscopy. We reviewed colon result letter sent to her. She is having normal bowel movements, feels at time it's not complete. Advised she could try adding on a capful of miralax daily. She's taking dicyclomine  as well QID as prescribed which helps some. Advised she should reach back to PCP and address further knee and pelvic concerns. Pt verbalized all understanding.

## 2024-08-16 ENCOUNTER — Encounter: Payer: Self-pay | Admitting: Gastroenterology

## 2024-08-16 ENCOUNTER — Other Ambulatory Visit (HOSPITAL_COMMUNITY): Payer: Self-pay

## 2024-08-16 ENCOUNTER — Ambulatory Visit: Admitting: Gastroenterology

## 2024-08-16 VITALS — BP 110/70 | HR 80 | Ht 63.75 in | Wt 113.1 lb

## 2024-08-16 DIAGNOSIS — R1031 Right lower quadrant pain: Secondary | ICD-10-CM

## 2024-08-16 DIAGNOSIS — Z860100 Personal history of colon polyps, unspecified: Secondary | ICD-10-CM

## 2024-08-16 MED ORDER — AMITRIPTYLINE HCL 10 MG PO TABS
10.0000 mg | ORAL_TABLET | Freq: Every day | ORAL | 3 refills | Status: AC
  Filled 2024-08-16: qty 30, 30d supply, fill #0

## 2024-08-16 NOTE — Patient Instructions (Signed)
 _______________________________________________________  If your blood pressure at your visit was 140/90 or greater, please contact your primary care physician to follow up on this.  _______________________________________________________  If you are age 70 or older, your body mass index should be between 23-30. Your Body mass index is 19.57 kg/m. If this is out of the aforementioned range listed, please consider follow up with your Primary Care Provider.  If you are age 19 or younger, your body mass index should be between 19-25. Your Body mass index is 19.57 kg/m. If this is out of the aformentioned range listed, please consider follow up with your Primary Care Provider.   ________________________________________________________  The [facility] providers would like to encourage you to use MYCHART to communicate with providers for non-urgent requests or questions.  Due to long hold times on the telephone, sending your provider a message by [facility] may be a faster and more efficient way to get a response.  Please allow 48 business hours for a response.  Please remember that this is for non-urgent requests.  _______________________________________________________  [facility] is using a team-based approach to care.  Your team is made up of your doctor and two to three APPS. Our APPS (Nurse Practitioners and Physician Assistants) work with your physician to ensure care continuity for you. They are fully qualified to address your health concerns and develop a treatment plan. They communicate directly with your gastroenterologist to care for you. Seeing the Advanced Practice Practitioners on your physician's team can help you by facilitating care more promptly, often allowing for earlier appointments, access to diagnostic testing, procedures, and other specialty referrals.   We have sent the following medications to your pharmacy for you to pick up at your convenience:  START: Elavil   10mg  at bedtime each night.  It was a pleasure to see you today!  Vito Cirigliano, D.O.

## 2024-08-16 NOTE — Progress Notes (Signed)
 Chief Complaint:    Abdominal pain   HPI:     Patient is a 68 y.o. female with a history of chronic pain in the RLQ referred to me by Dr. Avram for evaluation and treatment.  Was seen in the GI clinic by Delon Failing on 04/17/2024 for this issue.  Since then, completed EGD and colonoscopy as outlined below.  Has been evaluated with labs, CT x 2, RUQ US , EGD, colonoscopy, which have all been largely unrevealing as below.  The patient has been unresponsive to conservative management, no improvement with trial of Vicodin, NSAIDs, dicyclomine , and presents for consideration of abdominal wall injection for possible Anterior Cutaneous Nerve Entrapment Syndrome/Abdominal Wall Syndrome.  She reports pain started about 02/2023 and continues to recur.  Pain located in RLQ. Will feel a sudden catch without warning. Not related to specific movements and not related to PO intake, bowel habits, etc. Pain occurs seemingly at random. Pain lasts a few seconds then remits. Does not wake from sleep.   Interestingly, upon further discussion today she reports pain probably started shortly after her son's unexpected death.  Has had various abdominal symptoms since then, to include decreased appetite, generalized abdominal discomfort, but no nausea/vomiting or change in stools.   Reviewed labs from 04/2024: Normal lipase, K+ 3.4 (stable from previous) and otherwise normal CMP.  CBC at baseline. - 04/24/2024: CT A/P: No acute intra-abdominal pathology - 05/01/2024: RUQ US : Multiple gallbladder polyps measuring up to 4 x 4 mm, otherwise unremarkable study - 05/02/2024: CT A/P: No acute intra-abdominal pathology, sigmoid diverticulosis  Endoscopic History: - 06/14/2017: Colonoscopy done for iron deficiency anemia with 1 polyp in the ascending colon, 3 small polyps in the ascending colon and diverticulosis in the sigmoid colon.  Pathology showed a mixture of diminutive adenomas and mucosal polyps.  Recall placed for  06/14/2022. -  06/14/2017: EGD with gastritis and otherwise normal. - 06/14/2024: EGD: Normal - 06/14/2024: Colonoscopy: Adherent stool throughout, diminutive ascending colon polyp (adenoma).  Recommended short interval repeat colonoscopy - 07/28/2024: Colonoscopy: 5 subcentimeter polyps (adenomas), sigmoid diverticulosis.  Recall in 2028    Review of systems:     No chest pain, no SOB, no fevers, no urinary sx   Past Medical History:  Diagnosis Date   Allergy    Anxiety    occasional   Arthritis    Atherosclerotic peripheral vascular disease with intermittent claudication (HCC)    s/p fem-fem bypass 2011. ABI 07/2012 0.5 R 0.65 L   Diabetes mellitus without complication (HCC)    GERD (gastroesophageal reflux disease)    Hyperlipidemia    Hypertension    IDDM (insulin  dependent diabetes mellitus) 07/21/2007   July 2011: Femoral-femoral bypass  ABI 08/11/2012 notable for 0.4-0.59 on right ankle suggestive of severe arterial occlusive disease at rest and 0.60-0.7 on left ankle suggestive of moderate arterial occlusive disease at rest.    Iron deficiency anemia 05/08/2017   Low back pain 12/18/2013   Oral thrush 09/29/2022   Personal history of colonic polyps - adenomas 03/07/2014   PVD (peripheral vascular disease) (HCC)    Rotator cuff impingement syndrome of left shoulder 03/23/2016   Tobacco abuse     Patient's surgical history, family medical history, social history, medications and allergies were all reviewed in Epic    Current Outpatient Medications  Medication Sig Dispense Refill   Accu-Chek FastClix Lancets MISC Use 1 lancet as directed to check blood sugar levels. 100 each 5   amLODipine  (NORVASC )  10 MG tablet Take 1 tablet (10 mg total) by mouth every morning. 90 tablet 3   aspirin  EC 81 MG tablet Take 81 mg by mouth daily.     atorvastatin  (LIPITOR) 40 MG tablet Take 1 tablet (40 mg total) by mouth at bedtime. 90 tablet 3   Blood Glucose Monitoring Suppl (ACCU-CHEK GUIDE ME)  w/Device KIT use once a day as directed. 1 kit 1   brimonidine  (ALPHAGAN ) 0.2 % ophthalmic solution Instill 1 drop in affected eye(s) in the morning, in the afternoon and in the evening 10 mL 3   calcium -vitamin D  (OSCAL WITH D) 500-200 MG-UNIT tablet Take 1 tablet by mouth 2 (two) times daily. 60 tablet 3   Dapagliflozin  Pro-metFORMIN  ER (XIGDUO  XR) 04-999 MG TB24 Take 1 tablet by mouth daily for diabetes. 90 tablet 3   diclofenac  Sodium (VOLTAREN ) 1 % GEL Apply 2 g topically 4 (four) times daily. (Patient not taking: Reported on 07/28/2024) 100 g 2   dicyclomine  (BENTYL ) 10 MG capsule Take 1 capsule (10 mg total) by mouth 4 (four) times daily -  before meals and at bedtime. 120 capsule 5   Dulaglutide  (TRULICITY ) 1.5 MG/0.5ML SOAJ Inject 1.5 mg into the skin once a week on Tuesday. 6 mL 3   glucose blood (ACCU-CHEK GUIDE TEST) test strip Use to test Blood Sugars once daily 300 strip 3   insulin  degludec (TRESIBA  FLEXTOUCH) 100 UNIT/ML FlexTouch Pen Inject 24 Units into the skin in the morning. 15 mL 3   Insulin  Pen Needle 32G X 4 MM MISC Use to inject insulin  once daily at bedtime 100 each 4   latanoprost  (XALATAN ) 0.005 % ophthalmic solution Instill 1 drop in the affected eye(s) once daily in the evening. (Patient not taking: Reported on 08/16/2024) 2.5 mL 6   mirtazapine  (REMERON ) 7.5 MG tablet Take 1 tablet (7.5 mg total) by mouth at bedtime for sleep and appetite. (Patient not taking: Reported on 08/16/2024) 90 tablet 3   mupirocin  ointment (BACTROBAN ) 2 % Apply 1 Application topically daily. (Patient not taking: Reported on 08/16/2024) 22 g 0   omeprazole  (PRILOSEC) 40 MG capsule Take 40 mg by mouth daily. (Patient not taking: Reported on 07/28/2024)     potassium chloride  SA (KLOR-CON  M) 20 MEQ tablet Take 2 tablets (40 mEq total) by mouth daily for 3 days. (Patient not taking: Reported on 08/16/2024) 6 tablet 0   No current facility-administered medications for this visit.    Physical Exam:      BP 110/70 (BP Location: Left Arm, Patient Position: Sitting)   Pulse 80   Ht 5' 3.75 (1.619 m) Comment: height measured without shoes  Wt 113 lb 2 oz (51.3 kg)   BMI 19.57 kg/m   GENERAL:  Pleasant female in NAD PSYCH: Cooperative, normal affect EENT:  conjunctiva pink, mucous membranes moist, neck supple without masses CARDIAC:  RRR, no murmur heard, no peripheral edema PULM: Normal respiratory effort, lungs CTA bilaterally, no wheezing ABDOMEN: Generalized tenderness in RLQ and periumbilical area located in a broad distribution.  No specific point of increased pinpoint pain and Carnett's sign sign was equivocal on multiple attempts.  Otherwise, nondistended, soft.  No peritoneal signs.  No obvious masses, no hepatomegaly,  normal bowel sounds SKIN:  turgor, no lesions seen Musculoskeletal:  Normal muscle tone, normal strength NEURO: Alert and oriented x 3, no focal neurologic deficits    IMPRESSION and PLAN:    1) RLQ pain Recurrent, episodic RLQ pain.  Exam today demonstrates  area of discomfort and a broad distribution in the RLQ extending over to the umbilicus.  She does report this is the area where she experiences her symptoms and a large crescent moon shaped distribution.  No focal point of increased pain nor reproducible +Carnett's sign.  Do not think that symptoms are localizing enough that abdominal wall injection would provide therapeutic benefit.  Interestingly, a few of her described symptoms (RLQ pain, decreased appetite) started after her son's sudden cardiac arrest in 2024.  Has otherwise had an extensive evaluation to date to include labs, CT x 2, ultrasound, EGD, colonoscopy.  Did discuss the role/utility of neuromodulation and she is interested for diagnostic and therapeutic intent.  - Start Elavil  10 mg at bedtime and can increase to 25 mg if no significant improvement after 3-4 weeks and uptitrate as necessary - Can follow-up with me on an as-needed basis.   Otherwise will follow-up with Delon Failing, PA-C or Dr. Avram  2) History of colon polyps - Repeat colonoscopy 2028 for ongoing polyp surveillance       Sandor LULLA Flatter ,DO, FACG 08/16/2024, 10:42 AM

## 2024-08-18 ENCOUNTER — Other Ambulatory Visit (HOSPITAL_COMMUNITY): Payer: Self-pay

## 2024-09-11 ENCOUNTER — Other Ambulatory Visit (HOSPITAL_COMMUNITY): Payer: Self-pay

## 2024-09-11 MED ORDER — TRESIBA FLEXTOUCH 100 UNIT/ML ~~LOC~~ SOPN
29.0000 [IU] | PEN_INJECTOR | Freq: Every morning | SUBCUTANEOUS | 3 refills | Status: AC
  Filled 2024-09-11: qty 18, 62d supply, fill #0
  Filled 2024-12-08: qty 18, 62d supply, fill #1

## 2024-09-11 MED ORDER — DAPAGLIFLOZIN PRO-METFORMIN ER 5-1000 MG PO TB24
1.0000 | ORAL_TABLET | Freq: Two times a day (BID) | ORAL | 3 refills | Status: AC
  Filled 2024-09-11: qty 180, 90d supply, fill #0

## 2024-09-11 MED ORDER — ATORVASTATIN CALCIUM 40 MG PO TABS
40.0000 mg | ORAL_TABLET | Freq: Every day | ORAL | 3 refills | Status: AC
  Filled 2024-09-11: qty 90, 90d supply, fill #0

## 2024-09-13 ENCOUNTER — Other Ambulatory Visit (HOSPITAL_COMMUNITY): Payer: Self-pay

## 2024-10-13 ENCOUNTER — Other Ambulatory Visit (HOSPITAL_COMMUNITY): Payer: Self-pay

## 2024-10-13 MED ORDER — AMLODIPINE BESYLATE 10 MG PO TABS
10.0000 mg | ORAL_TABLET | Freq: Every morning | ORAL | 3 refills | Status: AC
  Filled 2024-10-13: qty 90, 90d supply, fill #0

## 2024-10-13 MED ORDER — ACCU-CHEK GUIDE TEST VI STRP
1.0000 | ORAL_STRIP | Freq: Every day | 3 refills | Status: AC
  Filled 2024-10-13: qty 100, 100d supply, fill #0

## 2024-10-17 ENCOUNTER — Other Ambulatory Visit (HOSPITAL_COMMUNITY): Payer: Self-pay

## 2024-11-01 ENCOUNTER — Other Ambulatory Visit (HOSPITAL_COMMUNITY): Payer: Self-pay

## 2024-11-01 MED ORDER — TRESIBA FLEXTOUCH 100 UNIT/ML ~~LOC~~ SOPN
34.0000 [IU] | PEN_INJECTOR | Freq: Every morning | SUBCUTANEOUS | 3 refills | Status: AC
  Filled 2024-11-01: qty 18, 52d supply, fill #0

## 2024-11-13 ENCOUNTER — Other Ambulatory Visit (HOSPITAL_COMMUNITY): Payer: Self-pay

## 2024-12-05 ENCOUNTER — Other Ambulatory Visit (HOSPITAL_COMMUNITY): Payer: Self-pay

## 2024-12-08 ENCOUNTER — Other Ambulatory Visit (HOSPITAL_COMMUNITY): Payer: Self-pay

## 2024-12-25 ENCOUNTER — Ambulatory Visit (HOSPITAL_COMMUNITY)
Admission: RE | Admit: 2024-12-25 | Discharge: 2024-12-25 | Disposition: A | Discharge status: Home / Self Care | Source: Ambulatory Visit | Attending: Surgery | Admitting: Surgery

## 2024-12-25 ENCOUNTER — Ambulatory Visit

## 2024-12-25 ENCOUNTER — Ambulatory Visit (HOSPITAL_COMMUNITY): Admission: RE | Admit: 2024-12-25 | Discharge: 2024-12-25 | Attending: Surgery | Admitting: Surgery

## 2024-12-25 DIAGNOSIS — I70509 Unspecified atherosclerosis of nonautologous biological bypass graft(s) of the extremities, unspecified extremity: Secondary | ICD-10-CM | POA: Insufficient documentation

## 2024-12-25 LAB — VAS US ABI WITH/WO TBI
Left ABI: 0.65
Right ABI: 0.58

## 2025-02-12 ENCOUNTER — Ambulatory Visit
